# Patient Record
Sex: Female | Born: 1935 | Race: White | Hispanic: No | Smoking: Never smoker
Health system: Southern US, Community
[De-identification: ages and names within clinical notes are randomized; demographics above are authoritative.]

## PROBLEM LIST (undated history)

## (undated) DIAGNOSIS — K449 Diaphragmatic hernia without obstruction or gangrene: Secondary | ICD-10-CM

## (undated) DIAGNOSIS — I10 Essential (primary) hypertension: Secondary | ICD-10-CM

## (undated) DIAGNOSIS — C50919 Malignant neoplasm of unspecified site of unspecified female breast: Secondary | ICD-10-CM

## (undated) DIAGNOSIS — C189 Malignant neoplasm of colon, unspecified: Secondary | ICD-10-CM

## (undated) DIAGNOSIS — B029 Zoster without complications: Secondary | ICD-10-CM

## (undated) DIAGNOSIS — H269 Unspecified cataract: Secondary | ICD-10-CM

## (undated) DIAGNOSIS — D239 Other benign neoplasm of skin, unspecified: Secondary | ICD-10-CM

## (undated) DIAGNOSIS — M1990 Unspecified osteoarthritis, unspecified site: Secondary | ICD-10-CM

## (undated) DIAGNOSIS — K639 Disease of intestine, unspecified: Secondary | ICD-10-CM

## (undated) DIAGNOSIS — T7840XA Allergy, unspecified, initial encounter: Secondary | ICD-10-CM

## (undated) DIAGNOSIS — Z8719 Personal history of other diseases of the digestive system: Secondary | ICD-10-CM

## (undated) DIAGNOSIS — Z853 Personal history of malignant neoplasm of breast: Secondary | ICD-10-CM

## (undated) DIAGNOSIS — H9190 Unspecified hearing loss, unspecified ear: Secondary | ICD-10-CM

## (undated) DIAGNOSIS — C801 Malignant (primary) neoplasm, unspecified: Secondary | ICD-10-CM

## (undated) DIAGNOSIS — S62101A Fracture of unspecified carpal bone, right wrist, initial encounter for closed fracture: Secondary | ICD-10-CM

## (undated) DIAGNOSIS — Z8711 Personal history of peptic ulcer disease: Secondary | ICD-10-CM

## (undated) DIAGNOSIS — N952 Postmenopausal atrophic vaginitis: Secondary | ICD-10-CM

## (undated) DIAGNOSIS — R12 Heartburn: Secondary | ICD-10-CM

## (undated) HISTORY — DX: Malignant neoplasm of unspecified site of unspecified female breast: C50.919

## (undated) HISTORY — DX: Unspecified hearing loss, unspecified ear: H91.90

## (undated) HISTORY — PX: COLON SURGERY: SHX602

## (undated) HISTORY — DX: Malignant neoplasm of colon, unspecified: C18.9

## (undated) HISTORY — DX: Personal history of malignant neoplasm of breast: Z85.3

## (undated) HISTORY — PX: ABDOMINAL HYSTERECTOMY: SHX81

## (undated) HISTORY — DX: Zoster without complications: B02.9

## (undated) HISTORY — DX: Postmenopausal atrophic vaginitis: N95.2

## (undated) HISTORY — DX: Disease of intestine, unspecified: K63.9

## (undated) HISTORY — DX: Allergy, unspecified, initial encounter: T78.40XA

## (undated) HISTORY — DX: Other benign neoplasm of skin, unspecified: D23.9

## (undated) HISTORY — DX: Unspecified cataract: H26.9

## (undated) HISTORY — DX: Personal history of peptic ulcer disease: Z87.11

## (undated) HISTORY — DX: Personal history of other diseases of the digestive system: Z87.19

## (undated) HISTORY — DX: Diaphragmatic hernia without obstruction or gangrene: K44.9

## (undated) HISTORY — DX: Unspecified osteoarthritis, unspecified site: M19.90

## (undated) HISTORY — DX: Fracture of unspecified carpal bone, right wrist, initial encounter for closed fracture: S62.101A

## (undated) HISTORY — DX: Essential (primary) hypertension: I10

## (undated) HISTORY — DX: Malignant (primary) neoplasm, unspecified: C80.1

## (undated) HISTORY — DX: Heartburn: R12

---

## 1983-05-30 HISTORY — PX: COLON RESECTION: SHX5231

## 2005-01-16 ENCOUNTER — Ambulatory Visit: Payer: Self-pay | Admitting: General Practice

## 2005-01-16 ENCOUNTER — Other Ambulatory Visit: Payer: Self-pay

## 2005-01-27 ENCOUNTER — Ambulatory Visit: Payer: Self-pay | Admitting: General Practice

## 2008-12-24 ENCOUNTER — Emergency Department: Payer: Self-pay | Admitting: Emergency Medicine

## 2009-09-06 ENCOUNTER — Ambulatory Visit: Payer: Self-pay

## 2009-09-15 ENCOUNTER — Encounter: Admission: RE | Admit: 2009-09-15 | Discharge: 2009-09-15 | Payer: Self-pay | Admitting: Unknown Physician Specialty

## 2010-01-27 ENCOUNTER — Ambulatory Visit: Payer: Self-pay | Admitting: Oncology

## 2010-02-04 ENCOUNTER — Inpatient Hospital Stay: Payer: Self-pay | Admitting: Internal Medicine

## 2010-02-08 LAB — CEA: CEA: 1.2 ng/mL (ref 0.0–4.7)

## 2010-02-21 ENCOUNTER — Ambulatory Visit: Payer: Self-pay | Admitting: Unknown Physician Specialty

## 2010-02-26 ENCOUNTER — Ambulatory Visit: Payer: Self-pay | Admitting: Oncology

## 2010-05-29 DIAGNOSIS — I10 Essential (primary) hypertension: Secondary | ICD-10-CM

## 2010-05-29 HISTORY — DX: Essential (primary) hypertension: I10

## 2010-05-29 HISTORY — PX: COLONOSCOPY: SHX174

## 2010-05-29 HISTORY — PX: UPPER GI ENDOSCOPY: SHX6162

## 2011-05-30 DIAGNOSIS — Z853 Personal history of malignant neoplasm of breast: Secondary | ICD-10-CM

## 2011-05-30 HISTORY — DX: Personal history of malignant neoplasm of breast: Z85.3

## 2011-05-30 HISTORY — PX: MASTECTOMY: SHX3

## 2011-08-02 ENCOUNTER — Ambulatory Visit: Payer: Self-pay | Admitting: Family Medicine

## 2011-08-07 ENCOUNTER — Ambulatory Visit: Payer: Self-pay | Admitting: General Surgery

## 2011-08-30 ENCOUNTER — Ambulatory Visit: Payer: Self-pay | Admitting: General Surgery

## 2011-09-06 ENCOUNTER — Ambulatory Visit: Payer: Self-pay | Admitting: General Surgery

## 2011-09-06 DIAGNOSIS — C50919 Malignant neoplasm of unspecified site of unspecified female breast: Secondary | ICD-10-CM

## 2011-09-06 HISTORY — PX: BREAST SURGERY: SHX581

## 2011-09-06 HISTORY — DX: Malignant neoplasm of unspecified site of unspecified female breast: C50.919

## 2011-09-15 LAB — PATHOLOGY REPORT

## 2011-09-22 ENCOUNTER — Ambulatory Visit: Payer: Self-pay | Admitting: Oncology

## 2012-01-06 ENCOUNTER — Emergency Department: Payer: Self-pay | Admitting: Emergency Medicine

## 2012-01-06 LAB — URINALYSIS, COMPLETE
Bilirubin,UR: NEGATIVE
Blood: NEGATIVE
Glucose,UR: 150 mg/dL (ref 0–75)
Specific Gravity: 1.008 (ref 1.003–1.030)
Squamous Epithelial: <1

## 2012-01-06 LAB — COMPREHENSIVE METABOLIC PANEL
Anion Gap: 9 (ref 7–16)
Calcium, Total: 8.7 mg/dL (ref 8.5–10.1)
Creatinine: 0.66 mg/dL (ref 0.60–1.30)
EGFR (African American): >60
Glucose: 213 mg/dL — ABNORMAL HIGH (ref 65–99)
Potassium: 3.5 mmol/L (ref 3.5–5.1)
Sodium: 138 mmol/L (ref 136–145)

## 2012-01-06 LAB — CBC
HCT: 41.1 % (ref 35.0–47.0)
RBC: 4.34 10*6/uL (ref 3.80–5.20)
RDW: 14 % (ref 11.5–14.5)
WBC: 11.4 10*3/uL — ABNORMAL HIGH (ref 3.6–11.0)

## 2012-01-06 LAB — LIPASE, BLOOD: Lipase: 109 U/L (ref 73–393)

## 2012-07-27 ENCOUNTER — Encounter: Payer: Self-pay | Admitting: *Deleted

## 2012-07-27 DIAGNOSIS — I10 Essential (primary) hypertension: Secondary | ICD-10-CM | POA: Insufficient documentation

## 2012-09-04 ENCOUNTER — Encounter: Payer: Self-pay | Admitting: General Surgery

## 2012-09-04 ENCOUNTER — Ambulatory Visit (INDEPENDENT_AMBULATORY_CARE_PROVIDER_SITE_OTHER): Payer: Medicare Other | Admitting: General Surgery

## 2012-09-04 VITALS — BP 130/68 | HR 68 | Resp 16 | Ht 66.0 in | Wt 150.0 lb

## 2012-09-04 DIAGNOSIS — C50919 Malignant neoplasm of unspecified site of unspecified female breast: Secondary | ICD-10-CM

## 2012-09-04 DIAGNOSIS — C50912 Malignant neoplasm of unspecified site of left female breast: Secondary | ICD-10-CM

## 2012-09-04 NOTE — Patient Instructions (Addendum)
Call for any new breast concerns or issues.

## 2012-09-04 NOTE — Progress Notes (Signed)
Patient ID: [name], female   DOB: 07/15/1920, 79 y.o.   MRN: [id number]  Chief Complaint  Patient presents with  . Breast Cancer Long Term Follow Up    HPI [name] is a 79 y.o. female. Patient here today for follow up mammogram.  Patient with known history of left modified radical mastectomy on September 06, 2011, 3.8 cm primary tumor with a 4.6 cm axillary metastasis. 3 of 13 nodes were positive. T2,N1a lesion. This was a triple negative lesion.  Recovering from viral GI bug about 2 weeks ago and did not get her mammogram done. Patient complaints of left breast/flank "sorness" occasionally,   Otherwise no new breast issues.  The patient declines further mammograms. HPI  Past Medical History  Diagnosis Date  . Arthritis   . Cancer     colon 20 years ago  . Hypertension 2012  . Personal history of malignant neoplasm of breast 2013    LEFT MASTECTOMY,left modified radical mastectomy on September 06, 2011 483.8 cm primary tumor with a 4.6 cm axillary metastasis. 3 of 13 nodes were positive. T2,N1a lesion. This was a triple negative lesion  . Benign neoplasm of skin, site unspecified   . Hiatal hernia   . Bowel trouble     Past Surgical History  Procedure Laterality Date  . Mastectomy Left 2013    left modified radical mastectomy on September 06, 2011 483.8 cm primary tumor with a 4.6 cm axillary metastasis. 3 of 13 nodes were positive. T2,N1a lesion. This was a triple negative lesion  . Abdominal hysterectomy      42 YEARS AGO  . Colonoscopy  2012    DR. ELLIOTT  . Upper gi endoscopy  2012    BLEEDING  . Colon surgery  20 YEARS AGO  . Carpal tunnel release Right 45 YEARS AGO  . Breast surgery Left 09-06-11    left modified radical mastectomy on September 06, 2011 483.8 cm primary tumor with a 4.6 cm axillary metastasis. 3 of 13 nodes were positive. T2,N1a lesion. This was a triple negative lesion    Family History  Problem Relation Age of Onset  . Lung cancer Brother     colono ca   . Skin cancer Daughter   . Leukemia Father   . Colon cancer Mother   . Skin cancer Son     Social History History  Substance Use Topics  . Smoking status: Never Smoker   . Smokeless tobacco: Never Used  . Alcohol Use: No    Allergies  Allergen Reactions  . Codeine Nausea And Vomiting    Current Outpatient Prescriptions  Medication Sig Dispense Refill  . amLODipine (NORVASC) 5 MG tablet Take 5 mg by mouth daily.      Marland Kitchen aspirin 81 MG tablet Take 81 mg by mouth daily.      Marland Kitchen loperamide (IMODIUM A-D) 2 MG tablet Take 2 mg by mouth as needed for diarrhea or loose stools.      . metoprolol succinate (TOPROL-XL) 25 MG 24 hr tablet Take 25 mg by mouth daily.      . naproxen sodium (ANAPROX) 220 MG tablet Take 220 mg by mouth 2 (two) times daily with a meal.      . omeprazole (PRILOSEC) 20 MG capsule Take 20 mg by mouth 2 (two) times daily.       Marland Kitchen zolpidem (AMBIEN) 10 MG tablet Take 10 mg by mouth at bedtime as needed for sleep.  No current facility-administered medications for this visit.    Review of Systems Review of Systems  Constitutional: Negative.   Respiratory: Negative.   Cardiovascular: Negative.     Blood pressure 130/68, pulse 68, resp. rate 16, height 5\' 6"  (1.676 m), weight 150 lb (68.04 kg).  Physical Exam Physical Exam  Constitutional: She is oriented to person, place, and time. She appears well-developed.  Cardiovascular: Normal rate, regular rhythm and intact distal pulses.   Pulmonary/Chest: Effort normal and breath sounds normal. Right breast exhibits no inverted nipple, no mass, no nipple discharge, no skin change and no tenderness. Left breast exhibits no skin change and no tenderness.  Musculoskeletal: Normal range of motion.  Lymphadenopathy:    She has no cervical adenopathy.    She has no axillary adenopathy.  Neurological: She is alert and oriented to person, place, and time.  Skin: Skin is warm and dry.   Mild pitting edema bilateral  lower extremities.  Data Reviewed No  Assessment    Doing well status post left modified radical mastectomy.    Plan    The patient is amenable to a one year followup.       Earline Mayotte 09/05/2012, 8:03 PM

## 2012-09-05 ENCOUNTER — Encounter: Payer: Self-pay | Admitting: General Surgery

## 2012-09-05 DIAGNOSIS — C50912 Malignant neoplasm of unspecified site of left female breast: Secondary | ICD-10-CM | POA: Insufficient documentation

## 2012-12-10 ENCOUNTER — Ambulatory Visit: Payer: Self-pay | Admitting: Family Medicine

## 2013-09-03 ENCOUNTER — Other Ambulatory Visit: Payer: Self-pay | Admitting: Physical Medicine and Rehabilitation

## 2013-09-03 DIAGNOSIS — M4800 Spinal stenosis, site unspecified: Secondary | ICD-10-CM

## 2013-09-03 DIAGNOSIS — M545 Low back pain, unspecified: Secondary | ICD-10-CM

## 2013-09-03 DIAGNOSIS — M79605 Pain in left leg: Secondary | ICD-10-CM

## 2013-09-09 ENCOUNTER — Ambulatory Visit (INDEPENDENT_AMBULATORY_CARE_PROVIDER_SITE_OTHER): Payer: Medicare Other | Admitting: General Surgery

## 2013-09-09 ENCOUNTER — Encounter: Payer: Self-pay | Admitting: General Surgery

## 2013-09-09 VITALS — BP 148/80 | HR 70 | Resp 14 | Ht 66.0 in | Wt 156.0 lb

## 2013-09-09 DIAGNOSIS — C50912 Malignant neoplasm of unspecified site of left female breast: Secondary | ICD-10-CM

## 2013-09-09 DIAGNOSIS — Z853 Personal history of malignant neoplasm of breast: Secondary | ICD-10-CM

## 2013-09-09 NOTE — Progress Notes (Signed)
Patient ID: [name], female   DOB: [birth year], 80 y.o.   MRN: [id number]  Chief Complaint  Patient presents with  . Follow-up    breast cancer    HPI [name] is a 80 y.o. female.  Here today for her follow up breast cancer. Patient with known history of left modified radical mastectomy on September 06, 2011, 3.8 cm primary tumor with a 4.6 cm axillary metastasis. 3 of 13 nodes were positive. T2,N1a lesion. This was a triple negative lesion. States her back has been hurting "arthritis" and has been going to [facility] for "shots".  Her last right mammogram was 12-24-12. No new breast complaints.   HPI  Past Medical History  Diagnosis Date  . Hypertension 2012  . Personal history of malignant neoplasm of breast 2013    LEFT MASTECTOMY,left modified radical mastectomy on September 06, 2011 483.8 cm primary tumor with a 4.6 cm axillary metastasis. 3 of 13 nodes were positive. T2,N1a lesion. This was a triple negative lesion  . Benign neoplasm of skin, site unspecified   . Hiatal hernia   . Bowel trouble   . Arthritis     back  . Cancer     colon 20 years ago  . Cancer of breast April 2013    left breast    Past Surgical History  Procedure Laterality Date  . Mastectomy Left 2013    left modified radical mastectomy on September 06, 2011 Stage 2; 3.8 cm primary tumor with a 4.6 cm axillary metastasis. 3 of 13 nodes were positive. T2,N1a lesion. This was a triple negative lesion  . Abdominal hysterectomy      42 YEARS AGO  . Colonoscopy  2012    DR. ELLIOTT  . Upper gi endoscopy  2012    BLEEDING  . Colon surgery  20 YEARS AGO  . Carpal tunnel release Right 45 YEARS AGO  . Breast surgery Left 09-06-11    left modified radical mastectomy on September 06, 2011 483.8 cm primary tumor with a 4.6 cm axillary metastasis. 3 of 13 nodes were positive. T2,N1a lesion. This was a triple negative lesion    Family History  Problem Relation Age of Onset  . Lung cancer Brother     colono ca  . Skin  cancer Daughter   . Leukemia Father   . Colon cancer Mother   . Skin cancer Son     Social History History  Substance Use Topics  . Smoking status: Never Smoker   . Smokeless tobacco: Never Used  . Alcohol Use: No    Allergies  Allergen Reactions  . Codeine Nausea And Vomiting    Current Outpatient Prescriptions  Medication Sig Dispense Refill  . amLODipine (NORVASC) 5 MG tablet Take 5 mg by mouth daily.      Marland Kitchen aspirin 81 MG tablet Take 81 mg by mouth daily.      . Cranberry 500 MG CAPS Take by mouth daily.      Marland Kitchen loperamide (IMODIUM A-D) 2 MG tablet Take 2 mg by mouth as needed for diarrhea or loose stools.      . metoprolol succinate (TOPROL-XL) 25 MG 24 hr tablet Take 25 mg by mouth daily.      . naproxen sodium (ANAPROX) 220 MG tablet Take 220 mg by mouth 2 (two) times daily with a meal.      . omeprazole (PRILOSEC) 20 MG capsule Take 20 mg by mouth 2 (two) times daily.       Marland Kitchen  Potassium Gluconate 595 MG CAPS Take by mouth daily.      . Probiotic Product (ALIGN) 4 MG CAPS Take by mouth daily.      . temazepam (RESTORIL) 15 MG capsule Take 15 mg by mouth at bedtime.      . triamcinolone (NASACORT ALLERGY 24HR) 55 MCG/ACT AERO nasal inhaler Place 2 sprays into the nose daily.      . vitamin B-12 (CYANOCOBALAMIN) 1000 MCG tablet Take 1,000 mcg by mouth daily.       No current facility-administered medications for this visit.    Review of Systems Review of Systems  Constitutional: Negative.   Respiratory: Negative.   Cardiovascular: Negative.     Blood pressure 148/80, pulse 70, resp. rate 14, height 5\' 6"  (1.676 m), weight 156 lb (70.761 kg).  Physical Exam Physical Exam  Constitutional: She is oriented to person, place, and time. She appears well-developed and well-nourished.  Neck: Neck supple.  Cardiovascular: Normal rate, regular rhythm and normal heart sounds.   Pulmonary/Chest: Effort normal and breath sounds normal. Right breast exhibits no inverted nipple,  no mass, no nipple discharge, no skin change and no tenderness.  Left chest wall stable.  Lymphadenopathy:    She has no cervical adenopathy.    She has no axillary adenopathy.  Neurological: She is alert and oriented to person, place, and time.  Skin: Skin is warm and dry.    Data Reviewed December 10, 2012 right breast mammogram was reviewed. BI-RAD-2.  Assessment    Doing well status post left mastectomy. No evidence of recurrent disease.     Plan    The patient was amenable to a one year followup. She had declined further mammograms at the time of her last visit here in April 2014. Her PCP was able to get her to have a mammogram this past summer.  The patient's son accompanied her today and was present for the after exam interview.      PCP: Particia Jasper Luzelena Heeg 09/10/2013, 5:44 PM

## 2013-09-09 NOTE — Patient Instructions (Signed)
Continue self breast exams. Call office for any new breast issues or concerns. 

## 2013-09-10 ENCOUNTER — Encounter: Payer: Self-pay | Admitting: General Surgery

## 2013-09-16 ENCOUNTER — Ambulatory Visit
Admission: RE | Admit: 2013-09-16 | Discharge: 2013-09-16 | Disposition: A | Discharge status: Home / Self Care | Payer: Medicare Other | Source: Ambulatory Visit | Attending: Physical Medicine and Rehabilitation | Admitting: Physical Medicine and Rehabilitation

## 2013-09-16 VITALS — BP 162/75 | HR 67

## 2013-09-16 DIAGNOSIS — M5126 Other intervertebral disc displacement, lumbar region: Secondary | ICD-10-CM

## 2013-09-16 DIAGNOSIS — M545 Low back pain, unspecified: Secondary | ICD-10-CM

## 2013-09-16 DIAGNOSIS — M79605 Pain in left leg: Secondary | ICD-10-CM

## 2013-09-16 DIAGNOSIS — M4800 Spinal stenosis, site unspecified: Secondary | ICD-10-CM

## 2013-09-16 MED ORDER — IOHEXOL 180 MG/ML  SOLN
1.0000 mL | Freq: Once | INTRAMUSCULAR | Status: AC | PRN
  Administered 2013-09-16: 1 mL via EPIDURAL

## 2013-09-16 MED ORDER — METHYLPREDNISOLONE ACETATE 40 MG/ML INJ SUSP (RADIOLOG
120.0000 mg | Freq: Once | INTRAMUSCULAR | Status: AC
  Administered 2013-09-16: 120 mg via EPIDURAL

## 2013-09-16 NOTE — Discharge Instructions (Signed)

## 2013-10-07 ENCOUNTER — Encounter: Payer: Self-pay | Admitting: General Surgery

## 2014-03-30 ENCOUNTER — Encounter: Payer: Self-pay | Admitting: General Surgery

## 2014-03-31 ENCOUNTER — Ambulatory Visit: Payer: Self-pay | Admitting: Family Medicine

## 2014-04-28 DIAGNOSIS — B029 Zoster without complications: Secondary | ICD-10-CM

## 2014-04-28 HISTORY — DX: Zoster without complications: B02.9

## 2014-09-09 ENCOUNTER — Encounter: Payer: Self-pay | Admitting: General Surgery

## 2014-09-09 ENCOUNTER — Ambulatory Visit (INDEPENDENT_AMBULATORY_CARE_PROVIDER_SITE_OTHER): Payer: Medicare Other | Admitting: General Surgery

## 2014-09-09 VITALS — BP 160/86 | HR 86 | Resp 18 | Ht 66.0 in | Wt 157.0 lb

## 2014-09-09 DIAGNOSIS — C50912 Malignant neoplasm of unspecified site of left female breast: Secondary | ICD-10-CM

## 2014-09-09 NOTE — Progress Notes (Signed)
Patient ID: [name], female   DOB: June 18, 1920, 81 y.o.   MRN: [id number]  Chief Complaint  Patient presents with  . Follow-up    HPI [name] is a 81 y.o. female here today for her one year folow up breast cancer. She states she is doing well with no problems. She did have shingles Dec 2015. No new breast issues. She does have some carpal tunnel issue with the right hand. She has seen Skip Estimable, M.D. in the past and was encouraged to call his office for assessment. She is accompanied today by her daughter who was present for the interview and exam.  HPI  Past Medical History  Diagnosis Date  . Hypertension 2012  . Personal history of malignant neoplasm of breast 2013    LEFT MASTECTOMY,left modified radical mastectomy on September 06, 2011 483.8 cm primary tumor with a 4.6 cm axillary metastasis. 3 of 13 nodes were positive. T2,N1a lesion. This was a triple negative lesion  . Benign neoplasm of skin, site unspecified   . Hiatal hernia   . Bowel trouble   . Arthritis     back  . Shingles Dec 2015  . Hard of hearing   . Cancer     colon 20 years ago  . Cancer of breast April 10,.2013    Left breast, 3.8 cm, 4 cm axillary node, T2, N1a, ER negative PR negative, HER-2/neu not amplified.    Past Surgical History  Procedure Laterality Date  . Mastectomy Left 2013    left modified radical mastectomy on September 06, 2011 Stage 2; 3.8 cm primary tumor with a 4.6 cm axillary metastasis. 3 of 13 nodes were positive. T2,N1a lesion. This was a triple negative lesion  . Abdominal hysterectomy      42 YEARS AGO  . Colonoscopy  2012    DR. ELLIOTT  . Upper gi endoscopy  2012    BLEEDING  . Colon surgery  20 YEARS AGO  . Carpal tunnel release Right 45 YEARS AGO  . Breast surgery Left 09-06-11    left modified radical mastectomy on September 06, 2011 483.8 cm primary tumor with a 4.6 cm axillary metastasis. 3 of 13 nodes were positive. T2,N1a lesion. This was a triple negative lesion     Family History  Problem Relation Age of Onset  . Lung cancer Brother     colono ca  . Skin cancer Daughter   . Leukemia Father   . Colon cancer Mother   . Skin cancer Son     Social History History  Substance Use Topics  . Smoking status: Never Smoker   . Smokeless tobacco: Never Used  . Alcohol Use: No    Allergies  Allergen Reactions  . Codeine Nausea And Vomiting    Current Outpatient Prescriptions  Medication Sig Dispense Refill  . Cranberry 500 MG CAPS Take by mouth daily.    Marland Kitchen loperamide (IMODIUM A-D) 2 MG tablet Take 2 mg by mouth as needed for diarrhea or loose stools.    . metoprolol succinate (TOPROL-XL) 25 MG 24 hr tablet Take 25 mg by mouth daily.    . naproxen sodium (ANAPROX) 220 MG tablet Take 220 mg by mouth 2 (two) times daily with a meal.    . omeprazole (PRILOSEC) 20 MG capsule Take 20 mg by mouth 2 (two) times daily.     . Potassium Gluconate 595 MG CAPS Take by mouth daily.    . Probiotic Product (ALIGN) 4 MG  CAPS Take by mouth daily.    . temazepam (RESTORIL) 15 MG capsule Take 15 mg by mouth at bedtime.    . triamcinolone (NASACORT ALLERGY 24HR) 55 MCG/ACT AERO nasal inhaler Place 2 sprays into the nose as needed.     . vitamin B-12 (CYANOCOBALAMIN) 1000 MCG tablet Take 1,000 mcg by mouth daily.     No current facility-administered medications for this visit.    Review of Systems Review of Systems  Constitutional: Negative.   Respiratory: Negative.   Cardiovascular: Negative.     Blood pressure 160/86, pulse 86, resp. rate 18, height 5' 6" (1.676 m), weight 157 lb (71.215 kg).  Physical Exam Physical Exam  Constitutional: She is oriented to person, place, and time. She appears well-developed and well-nourished.  Neck: Neck supple.  Cardiovascular: Normal rate, regular rhythm and normal heart sounds.   Pulmonary/Chest: Effort normal and breath sounds normal. Right breast exhibits no inverted nipple, no mass, no nipple discharge, no skin  change and no tenderness.    Left modified mastectomy site well healed.  Lymphadenopathy:    She has no cervical adenopathy.    She has no axillary adenopathy.  Neurological: She is alert and oriented to person, place, and time.  Skin: Skin is warm and dry.    Data Reviewed Patient has declined further mammograms.  Assessment    Doing well now 3 years status post left modified radical mastectomy.    Plan    We'll plan for follow-up examination in one year    Patient to return in one year.   PCP:  Virgie Dad 09/09/2014, 10:05 AM

## 2014-09-09 NOTE — Patient Instructions (Signed)
The patient is aware to call back for any questions or concerns.  

## 2014-09-17 ENCOUNTER — Ambulatory Visit
Admit: 2014-09-17 | Disposition: A | Discharge status: Home / Self Care | Payer: Self-pay | Attending: General Practice | Admitting: General Practice

## 2014-09-20 NOTE — Op Note (Signed)
PATIENT NAME:  [name], [name] MR#:  [id number] DATE OF BIRTH:  [birth year]  DATE OF PROCEDURE:  09/06/2011  PREOPERATIVE DIAGNOSIS: Left breast cancer with axillary metastases.   POSTOPERATIVE DIAGNOSIS: Left breast cancer with axillary metastases.   OPERATIVE PROCEDURE: Left modified radical mastectomy.   OPERATING SURGEON: Robert Bellow, MD   ANESTHESIA: General endotracheal under Dr. Benjamine Mola.   ESTIMATED BLOOD LOSS: 30 mL.   CLINICAL NOTE: This 81 year old woman had a longstanding mass in the breast and recently had a core biopsy showing evidence of infiltrating mammary cancer. Given her options for management, she elected to proceed to modified radical mastectomy.   She received Kefzol prior to the procedure.   OPERATIVE NOTE: After undergoing general endotracheal anesthesia, the breast, chest, and axilla was prepped with ChloraPrep and draped. An elliptical incision was outlined beginning just lateral to the sternum and extending to the anterior axillary line. Flaps were elevated with the following limits: Clavicle superiorly, sternum medially, rectus fascia inferiorly, and the serratus muscle laterally. The rest was elevated off the underlying pectoralis muscle taking the fascia with the specimen. Electrocautery and Harmonic scalpel was used for hemostasis. The axillary envelope was opened and the axillary vein identified. Tissue inferior to this was swept away with the specimen. A slightly enlarged node at the apex of the level two nodes was sent as separate specimen. The thoracodorsal nerve, artery and vein bundle and the long thoracic nerve of Bell were identified and protected. The wound was irrigated with saline. A single Blake drain was brought out through the inferior flap on the medial aspect and anchored in place with 3-0 nylon. The skin was then approximated with interrupted 2-0 Vicryl deep dermal suture in a running fashion. Benzoin and Steri-Strips were applied. Fluff gauze,  Kerlix, and an Ace wrap were then placed.    The patient tolerated the procedure well and was taken to the recovery room in stable condition.   ____________________________ Robert Bellow, MD [id number] D: 09/06/2011 14:31:48 ET T: 09/06/2011 16:31:24 ET JOB#: [id number]  cc: Robert Bellow, MD, <Dictator> Irven Easterly. Kary Kos, MD Kennet Mccort Amedeo Kinsman MD ELECTRONICALLY SIGNED 09/08/2011 10:20

## 2014-09-25 ENCOUNTER — Ambulatory Visit
Admit: 2014-09-25 | Disposition: A | Discharge status: Home / Self Care | Payer: Self-pay | Attending: General Practice | Admitting: General Practice

## 2014-09-25 HISTORY — PX: CARPAL TUNNEL RELEASE: SHX101

## 2014-09-27 NOTE — Op Note (Addendum)
PATIENT NAME:  COPPER, KIRTLEY MR#:  [id number] DATE OF BIRTH:  09/13/1920  DATE OF PROCEDURE:  09/25/2014  PREOPERATIVE DIAGNOSIS: Right carpal tunnel syndrome.   POSTOPERATIVE DIAGNOSIS: Right carpal tunnel syndrome.   PROCEDURE PERFORMED: Right carpal tunnel release.   SURGEON: Laurice Record. Holley Bouche., MD   ANESTHESIA: Bier block.   ESTIMATED BLOOD LOSS: Minimal.   TOURNIQUET TIME: 41 minutes.   DRAINS: None.   INDICATIONS FOR SURGERY: The patient is a 81 year old, right-hand dominant female who presented with complaints of numbness, paresthesias, and pain to the right hand. The previous EMG nerve conduction studies demonstrated findings consistent with bilateral carpal tunnel syndrome. She previously underwent left carpal tunnel release with good results. After discussion of the risks and benefits of surgical intervention, the patient expressed understanding of the risks and benefits, and agreed with plans for surgical intervention.   PROCEDURE IN DETAIL: The patient was brought into the operating room and, after adequate Bier block was achieved, the patient's right hand and arm were cleaned and prepped with alcohol and DuraPrep and draped in the usual sterile fashion. A "timeout" was performed as per usual protocol. Loupe magnification was used throughout the procedure. A curvilinear incision was made, gently curving just ulnar to the thenar palmar crease. Dissection was carried down to the transverse carpal ligament. The transverse carpal ligament was sharply incised, taking care to protect the underlying structures within the carpal tunnel. Complete release of the transverse carpal ligament was achieved. The wound was irrigated with copious amounts of normal saline with antibiotic solution. Skin edges were reapproximated with interrupted sutures of #5-0 nylon. A sterile dressing was applied after injection of 10 mL of 0.25% Marcaine along the incision site. A volar splint was applied. The  tourniquet was deflated after a total tourniquet time of 41 minutes.   The patient tolerated the procedure well. She was transported to the recovery room in stable condition.    ____________________________ Laurice Record. Holley Bouche., MD [id number] D: 09/25/2014 09:11:52 ET T: 09/25/2014 11:11:31 ET JOB#: [id number]  cc: Jeneen Rinks P. Holley Bouche., MD, <Dictator> Rithik Odea P Holley Bouche MD ELECTRONICALLY SIGNED 09/26/2014 7:49

## 2015-02-15 ENCOUNTER — Other Ambulatory Visit: Payer: Self-pay | Admitting: Family Medicine

## 2015-02-15 DIAGNOSIS — G8929 Other chronic pain: Secondary | ICD-10-CM

## 2015-02-15 DIAGNOSIS — M545 Low back pain: Principal | ICD-10-CM

## 2015-02-24 ENCOUNTER — Ambulatory Visit
Admission: RE | Admit: 2015-02-24 | Discharge: 2015-02-24 | Disposition: A | Discharge status: Home / Self Care | Payer: Medicare Other | Source: Ambulatory Visit | Attending: Family Medicine | Admitting: Family Medicine

## 2015-02-24 VITALS — BP 179/90 | HR 72

## 2015-02-24 DIAGNOSIS — D126 Benign neoplasm of colon, unspecified: Secondary | ICD-10-CM | POA: Insufficient documentation

## 2015-02-24 DIAGNOSIS — G8929 Other chronic pain: Secondary | ICD-10-CM

## 2015-02-24 DIAGNOSIS — K5790 Diverticulosis of intestine, part unspecified, without perforation or abscess without bleeding: Secondary | ICD-10-CM | POA: Insufficient documentation

## 2015-02-24 DIAGNOSIS — M545 Low back pain: Secondary | ICD-10-CM

## 2015-02-24 DIAGNOSIS — K209 Esophagitis, unspecified without bleeding: Secondary | ICD-10-CM | POA: Insufficient documentation

## 2015-02-24 DIAGNOSIS — J329 Chronic sinusitis, unspecified: Secondary | ICD-10-CM | POA: Insufficient documentation

## 2015-02-24 DIAGNOSIS — D649 Anemia, unspecified: Secondary | ICD-10-CM | POA: Insufficient documentation

## 2015-02-24 DIAGNOSIS — K449 Diaphragmatic hernia without obstruction or gangrene: Secondary | ICD-10-CM | POA: Insufficient documentation

## 2015-02-24 DIAGNOSIS — K219 Gastro-esophageal reflux disease without esophagitis: Secondary | ICD-10-CM | POA: Insufficient documentation

## 2015-02-24 DIAGNOSIS — M5137 Other intervertebral disc degeneration, lumbosacral region: Secondary | ICD-10-CM

## 2015-02-24 MED ORDER — METHYLPREDNISOLONE ACETATE 40 MG/ML INJ SUSP (RADIOLOG
120.0000 mg | Freq: Once | INTRAMUSCULAR | Status: AC
  Administered 2015-02-24: 120 mg via EPIDURAL

## 2015-02-24 MED ORDER — IOHEXOL 180 MG/ML  SOLN
1.0000 mL | Freq: Once | INTRAMUSCULAR | Status: DC | PRN
  Administered 2015-02-24: 1 mL via EPIDURAL

## 2015-02-24 NOTE — Discharge Instructions (Signed)

## 2015-07-14 ENCOUNTER — Encounter: Payer: Self-pay | Admitting: Family Medicine

## 2015-07-19 ENCOUNTER — Encounter: Payer: Self-pay | Admitting: Urology

## 2015-07-19 ENCOUNTER — Ambulatory Visit (INDEPENDENT_AMBULATORY_CARE_PROVIDER_SITE_OTHER): Payer: Medicare Other | Admitting: Urology

## 2015-07-19 VITALS — BP 186/84 | HR 76 | Ht 66.0 in | Wt 153.4 lb

## 2015-07-19 DIAGNOSIS — N8110 Cystocele, unspecified: Secondary | ICD-10-CM

## 2015-07-19 DIAGNOSIS — IMO0001 Reserved for inherently not codable concepts without codable children: Secondary | ICD-10-CM

## 2015-07-19 DIAGNOSIS — N390 Urinary tract infection, site not specified: Secondary | ICD-10-CM | POA: Diagnosis not present

## 2015-07-19 DIAGNOSIS — N765 Ulceration of vagina: Secondary | ICD-10-CM

## 2015-07-19 DIAGNOSIS — IMO0002 Reserved for concepts with insufficient information to code with codable children: Secondary | ICD-10-CM

## 2015-07-19 DIAGNOSIS — N952 Postmenopausal atrophic vaginitis: Secondary | ICD-10-CM

## 2015-07-19 LAB — URINALYSIS, COMPLETE
Bilirubin, UA: NEGATIVE
Glucose, UA: NEGATIVE
KETONES UA: NEGATIVE
NITRITE UA: NEGATIVE
SPEC GRAV UA: 1.015 (ref 1.005–1.030)
Urobilinogen, Ur: 0.2 mg/dL (ref 0.2–1.0)
pH, UA: 5.5 (ref 5.0–7.5)

## 2015-07-19 LAB — MICROSCOPIC EXAMINATION: RBC MICROSCOPIC, UA: NONE SEEN /HPF (ref 0–?)

## 2015-07-19 LAB — BLADDER SCAN AMB NON-IMAGING: Scan Result: 210

## 2015-07-19 MED ORDER — ESTRADIOL 0.1 MG/GM VA CREA: TOPICAL_CREAM | VAGINAL | Status: DC

## 2015-07-19 NOTE — Progress Notes (Signed)
07/19/2015 3:51 PM   [name] 08/31/1920 [id number]  Referring provider: [name], MD [address] [facility] [place], [place] [address]  Chief Complaint  Patient presents with  . Recurrent UTI    referred by Dr. Mosie Epstein    HPI: Patient is a 82 year old Caucasian female with a history of recurrent urinary tract infections who is referred by her primary care physician, Dr. Kary Kos, for further evaluation and management.  Patient has had 4 documented urinary tract infections over the last year.  They have been positive for Escherichia coli and Klebsiella.  Her symptoms of urinary tract infections consist of lower back ache.  She most recently developed burning.  She has not had gross hematuria or suprapubic pain.  She does not have a prior history of nephrolithiasis.   She does state she drinks a lot of water.  She is not sexually active.  She does have diarrhea due to a previous colon surgery. She wears a pad daily, but she states she has intermittent minimal incontinence.  Today, she states she has experienced burning that started 2 weeks ago.  Her UA noted possible contamination. Her PVR was 210 mL on today's exam.  PMH: Past Medical History  Diagnosis Date  . Hypertension 2012  . Personal history of malignant neoplasm of breast 2013    LEFT MASTECTOMY,left modified radical mastectomy on September 06, 2011 483.8 cm primary tumor with a 4.6 cm axillary metastasis. 3 of 13 nodes were positive. T2,N1a lesion. This was a triple negative lesion  . Benign neoplasm of skin, site unspecified   . Hiatal hernia   . Bowel trouble   . Arthritis     back  . Shingles Dec 2015  . Hard of hearing   . Cancer ([place])     colon 20 years ago  . Cancer of breast [place]) April 10,.2013    Left breast, 3.8 cm, 4 cm axillary node, T2, N1a, ER negative PR negative, HER-2/neu not amplified.  Marland Kitchen Heartburn   . History of stomach ulcers     Surgical History: Past Surgical History    Procedure Laterality Date  . Mastectomy Left 2013    left modified radical mastectomy on September 06, 2011 Stage 2; 3.8 cm primary tumor with a 4.6 cm axillary metastasis. 3 of 13 nodes were positive. T2,N1a lesion. This was a triple negative lesion  . Abdominal hysterectomy      42 YEARS AGO  . Colonoscopy  2012    DR. ELLIOTT  . Upper gi endoscopy  2012    BLEEDING  . Colon surgery  20 YEARS AGO  . Carpal tunnel release Right 45 YEARS AGO  . Breast surgery Left 09-06-11    left modified radical mastectomy on September 06, 2011 483.8 cm primary tumor with a 4.6 cm axillary metastasis. 3 of 13 nodes were positive. T2,N1a lesion. This was a triple negative lesion    Home Medications:    Medication List       This list is accurate as of: 07/19/15  3:51 PM.  Always use your most recent med list.               ALIGN 4 MG Caps  Take by mouth daily.     ciprofloxacin 250 MG tablet  Commonly known as:  CIPRO  Reported on 07/19/2015     Cranberry 500 MG Caps  Take by mouth daily.     estradiol 0.1 MG/GM vaginal cream  Commonly known as:  ESTRACE VAGINAL  Apply 0.'5mg'$  (pea-sized amount)  just inside the vaginal introitus with a finger-tip every night for two weeks and then Monday, Wednesday and Friday nights.     IMODIUM A-D 2 MG tablet  Generic drug:  loperamide  Take 2 mg by mouth as needed for diarrhea or loose stools.     metoprolol succinate 25 MG 24 hr tablet  Commonly known as:  TOPROL-XL  Take 25 mg by mouth daily.     naproxen sodium 220 MG tablet  Commonly known as:  ANAPROX  Take 220 mg by mouth 2 (two) times daily with a meal.     NASACORT ALLERGY 24HR 55 MCG/ACT Aero nasal inhaler  Generic drug:  triamcinolone  Place 2 sprays into the nose as needed.     omeprazole 20 MG capsule  Commonly known as:  PRILOSEC  Take 20 mg by mouth 2 (two) times daily.     Potassium Gluconate 595 MG Caps  Take by mouth daily.     temazepam 15 MG capsule  Commonly known as:   RESTORIL  Take 15 mg by mouth at bedtime.     vitamin B-12 1000 MCG tablet  Commonly known as:  CYANOCOBALAMIN  Take 1,000 mcg by mouth daily.        Allergies:  Allergies  Allergen Reactions  . Codeine Nausea And Vomiting    Family History: Family History  Problem Relation Age of Onset  . Lung cancer Brother     colono ca  . Skin cancer Daughter   . Leukemia Father   . Colon cancer Mother   . Skin cancer Son   . Kidney disease Neg Hx   . Bladder Cancer Neg Hx     Social History:  reports that she has never smoked. She has never used smokeless tobacco. She reports that she does not drink alcohol or use illicit drugs.  ROS: UROLOGY Frequent Urination?: No Hard to postpone urination?: No Burning/pain with urination?: Yes Get up at night to urinate?: Yes Leakage of urine?: No Urine stream starts and stops?: No Trouble starting stream?: No Do you have to strain to urinate?: No Blood in urine?: No Urinary tract infection?: Yes Sexually transmitted disease?: No Injury to kidneys or bladder?: No Painful intercourse?: No Weak stream?: Yes Currently pregnant?: No Vaginal bleeding?: No Last menstrual period?: n  Gastrointestinal Nausea?: No Vomiting?: No Indigestion/heartburn?: Yes Diarrhea?: Yes Constipation?: No  Constitutional Fever: No Night sweats?: No Weight loss?: No Fatigue?: Yes  Skin Skin rash/lesions?: No Itching?: No  Eyes Blurred vision?: No Double vision?: No  Ears/Nose/Throat Sore throat?: No Sinus problems?: No  Hematologic/Lymphatic Swollen glands?: No Easy bruising?: Yes  Cardiovascular Leg swelling?: No Chest pain?: No  Respiratory Cough?: No Shortness of breath?: No  Endocrine Excessive thirst?: No  Musculoskeletal Back pain?: Yes Joint pain?: Yes  Neurological Headaches?: No Dizziness?: No  Psychologic Depression?: No Anxiety?: No  Physical Exam: BP 186/84 mmHg  Pulse 76  Ht '5\' 6"'$  (1.676 m)  Wt 153  lb 6.4 oz (69.582 kg)  BMI 24.77 kg/m2  Constitutional: Well nourished. Alert and oriented, No acute distress. HEENT: [place] AT, moist mucus membranes. Trachea midline, no masses. Cardiovascular: No clubbing, cyanosis, or edema. Respiratory: Normal respiratory effort, no increased work of breathing. GI: Abdomen is soft, non tender, non distended, no abdominal masses. Liver and spleen not palpable.  No hernias appreciated.  Stool sample for occult testing is not indicated.   GU: No  CVA tenderness.  No bladder fullness or masses.  Atrophic external genitalia, sparse pubic hair distribution, no lesions.  Normal urethral meatus, no lesions, no prolapse, no discharge.   Urethral caruncle is noted. No bladder fullness, tenderness or masses. Ulcerative vagina mucosa at the base, poor estrogen effect, no discharge, no lesions, good pelvic support, cystocele is noted.  No rectocele noted.  Uterus and cervix are surgically absent.    No pelvic masses noted.   Anus and perineum are without rashes or lesions.    Skin: No rashes, bruises or suspicious lesions. Lymph: No cervical or inguinal adenopathy. Neurologic: Grossly intact, no focal deficits, moving all 4 extremities. Psychiatric: Normal mood and affect.  Laboratory Data: Lab Results  Component Value Date   WBC 11.4* 01/06/2012   HGB 13.7 01/06/2012   HCT 41.1 01/06/2012   MCV 95 01/06/2012   PLT 290 01/06/2012    Lab Results  Component Value Date   CREATININE 0.66 01/06/2012   Lab Results  Component Value Date   AST 23 01/06/2012   Lab Results  Component Value Date   ALT 21 01/06/2012    Urinalysis Results for orders placed or performed in visit on 07/19/15  Microscopic Examination  Result Value Ref Range   WBC, UA >30 (H) 0 -  5 /hpf   RBC, UA None seen 0 -  2 /hpf   Epithelial Cells (non renal) 0-10 0 - 10 /hpf   Bacteria, UA Many (A) None seen/Few  Urinalysis, Complete  Result Value Ref Range   Specific Gravity, UA 1.015  1.005 - 1.030   pH, UA 5.5 5.0 - 7.5   Color, UA Yellow Yellow   Appearance Ur Cloudy (A) Clear   Leukocytes, UA 1+ (A) Negative   Protein, UA 1+ (A) Negative/Trace   Glucose, UA Negative Negative   Ketones, UA Negative Negative   RBC, UA 2+ (A) Negative   Bilirubin, UA Negative Negative   Urobilinogen, Ur 0.2 0.2 - 1.0 mg/dL   Nitrite, UA Negative Negative   Microscopic Examination See below:    Pertinent Imaging: Results for ALLYSE, FREGEAU (MRN [id number]) as of 07/19/2015 11:36  Ref. Range 07/19/2015 11:16  Scan Result Unknown 210    Assessment & Plan:    1. Recurrent UTI:   Patient has had 4 documented UTIs over the last year.  She was found to have atrophic vaginitis, wears pads continuously even though she has minimal incontinence, has a moderate residual and has chronic diarrhea.  I explained to the patient that these can contribute to her recurrent UTIs.  She'll be referred to gynecology for possible pessary fitting and an evaluation of the vaginal ulcer.  She is also given a prescription of Estrace cream and instructed on its use.  She will return to [place] when she has completed her gynecology referral.  At that time we'll recheck a UA, PVR and exam.  She will also try to not wear the pad so often.    - Urinalysis, Complete - BLADDER SCAN AMB NON-IMAGING  2. Atrophic vaginitis:   Patient was prescribed vaginal estrogen cream and instructed to apply 0.'5mg'$  (pea-sized amount)  just inside the vaginal introitus with a finger-tip every night for two weeks and then Monday, Wednesday and Friday nights.  I explained to the patient that vaginally administered estrogen, which causes only a slight increase in the blood estrogen levels, have fewer contraindications and adverse systemic effects that oral HT.  3. Cystocele:   Patient will  be referred to gynecology for pessary fitting.    4. Vaginal ulcer:   Patient will be referred to gynecology for further evaluation.   Return for refer to  gynecology.  These notes generated with voice recognition software. I apologize for typographical errors.  Zara Council, [facility] [address] [place], [place] [address] [phone]

## 2015-07-19 NOTE — Patient Instructions (Signed)
Patient was prescribed vaginal estrogen cream and instructed to apply 0.5mg  (pea-sized amount)  just inside the vaginal introitus with a finger-tip every night for two weeks and then Monday, Wednesday and Friday nights.  I explained to the patient that vaginally administered estrogen, which causes only a slight increase in the blood estrogen levels, have fewer contraindications and adverse systemic effects that oral HT.

## 2015-08-10 ENCOUNTER — Ambulatory Visit (INDEPENDENT_AMBULATORY_CARE_PROVIDER_SITE_OTHER): Payer: Medicare Other | Admitting: Obstetrics and Gynecology

## 2015-08-10 ENCOUNTER — Encounter: Payer: Self-pay | Admitting: Obstetrics and Gynecology

## 2015-08-10 VITALS — BP 168/80 | HR 76 | Ht 64.0 in | Wt 159.0 lb

## 2015-08-10 DIAGNOSIS — N8111 Cystocele, midline: Secondary | ICD-10-CM | POA: Insufficient documentation

## 2015-08-10 DIAGNOSIS — R399 Unspecified symptoms and signs involving the genitourinary system: Secondary | ICD-10-CM | POA: Diagnosis not present

## 2015-08-10 DIAGNOSIS — R159 Full incontinence of feces: Secondary | ICD-10-CM

## 2015-08-10 DIAGNOSIS — M545 Low back pain, unspecified: Secondary | ICD-10-CM | POA: Insufficient documentation

## 2015-08-10 DIAGNOSIS — N952 Postmenopausal atrophic vaginitis: Secondary | ICD-10-CM

## 2015-08-10 DIAGNOSIS — R339 Retention of urine, unspecified: Secondary | ICD-10-CM

## 2015-08-10 DIAGNOSIS — Z87440 Personal history of urinary (tract) infections: Secondary | ICD-10-CM

## 2015-08-10 HISTORY — DX: Postmenopausal atrophic vaginitis: N95.2

## 2015-08-10 LAB — POCT URINALYSIS DIPSTICK
BILIRUBIN UA: NEGATIVE
GLUCOSE UA: NEGATIVE
KETONES UA: NEGATIVE
Nitrite, UA: NEGATIVE
PH UA: 6
Protein, UA: NEGATIVE
RBC UA: NEGATIVE
Spec Grav, UA: 1.025
Urobilinogen, UA: NEGATIVE

## 2015-08-10 NOTE — Patient Instructions (Signed)

## 2015-08-10 NOTE — Progress Notes (Signed)
Subjective:    [name] is a 82 y.o. [id number] female who presents as a refferral from [facility] Zara Council, [place])  for evaluation of a cystocele and "vaginal ulcers", and recurrent UTIs. Problem started several years ago (having bladder drop), however notes frequent UTIs beginning  ~ 1 year ago ago (approximately 4 in past year). Symptoms include: prolapse of tissue with straining, discomfort: moderate and incomplete bladder emptying, and back pain. Symptoms have gradually worsened.  Also complains of burning with urination today.  Desires to be screened for UTI.   Of note, patient has had a history of breast cancer ~ 4 years ago, s/p double mastectomy (for left breast cancer), and h/o colon cancer ~ 30-40 years ago (s/p hemicolectomy).  Does note some diarrhea and intermittent minimal fecal incontinence.   Menstrual History: OB History    Gravida Para Term Preterm AB TAB SAB Ectopic Multiple Living   '5 4   1  1   4      ' Obstetric Comments    First Pregnancy 27  Fisrt menstrual 2      Menarche age: 68 No LMP recorded. Patient has had a hysterectomy.      Past Medical History  Diagnosis Date  . Hypertension 2012  . Personal history of malignant neoplasm of breast 2013    LEFT MASTECTOMY,left modified radical mastectomy on September 06, 2011 483.8 cm primary tumor with a 4.6 cm axillary metastasis. 3 of 13 nodes were positive. T2,N1a lesion. This was a triple negative lesion  . Benign neoplasm of skin, site unspecified   . Hiatal hernia   . Bowel trouble   . Arthritis     back  . Shingles Dec 2015  . Hard of hearing   . Cancer ([place])     colon 20 years ago  . Cancer of breast [facility]) April 10,.2013    Left breast, 3.8 cm, 4 cm axillary node, T2, N1a, ER negative PR negative, HER-2/neu not amplified.  Marland Kitchen Heartburn   . History of stomach ulcers     Family History  Problem Relation Age of Onset  . Lung cancer Brother     colono ca  . Skin cancer  Daughter   . Leukemia Father   . Colon cancer Mother   . Skin cancer Son   . Kidney disease Neg Hx   . Bladder Cancer Neg Hx     Past Surgical History  Procedure Laterality Date  . Mastectomy Left 2013    left modified radical mastectomy on September 06, 2011 Stage 2; 3.8 cm primary tumor with a 4.6 cm axillary metastasis. 3 of 13 nodes were positive. T2,N1a lesion. This was a triple negative lesion  . Abdominal hysterectomy      42 YEARS AGO  . Colonoscopy  2012    DR. ELLIOTT  . Upper gi endoscopy  2012    BLEEDING  . Colon surgery  20 YEARS AGO  . Carpal tunnel release Right 45 YEARS AGO  . Breast surgery Left 09-06-11    left modified radical mastectomy on September 06, 2011 483.8 cm primary tumor with a 4.6 cm axillary metastasis. 3 of 13 nodes were positive. T2,N1a lesion. This was a triple negative lesion     Social History   Social History  . Marital Status: Widowed    Spouse Name: N/A  . Number of Children: N/A  . Years of Education: N/A   Occupational History  . Not on  file.   Social History Main Topics  . Smoking status: Never Smoker   . Smokeless tobacco: Never Used  . Alcohol Use: No  . Drug Use: No  . Sexual Activity: Not on file   Other Topics Concern  . Not on file   Social History Narrative    Current Outpatient Prescriptions on File Prior to Visit  Medication Sig Dispense Refill  . ciprofloxacin (CIPRO) 250 MG tablet Reported on 07/19/2015  0  . Cranberry 500 MG CAPS Take by mouth daily.    Marland Kitchen estradiol (ESTRACE VAGINAL) 0.1 MG/GM vaginal cream Apply 0.64m (pea-sized amount)  just inside the vaginal introitus with a finger-tip every night for two weeks and then Monday, Wednesday and Friday nights. 30 g 12  . loperamide (IMODIUM A-D) 2 MG tablet Take 2 mg by mouth as needed for diarrhea or loose stools.    . metoprolol succinate (TOPROL-XL) 25 MG 24 hr tablet Take 25 mg by mouth daily.    . naproxen sodium (ANAPROX) 220 MG tablet Take 220 mg by mouth 2  (two) times daily with a meal.    . omeprazole (PRILOSEC) 20 MG capsule Take 20 mg by mouth 2 (two) times daily.     . Potassium Gluconate 595 MG CAPS Take by mouth daily.    . Probiotic Product (ALIGN) 4 MG CAPS Take by mouth daily.    . temazepam (RESTORIL) 15 MG capsule Take 15 mg by mouth at bedtime.    . triamcinolone (NASACORT ALLERGY 24HR) 55 MCG/ACT AERO nasal inhaler Place 2 sprays into the nose as needed.     . vitamin B-12 (CYANOCOBALAMIN) 1000 MCG tablet Take 1,000 mcg by mouth daily.     No current facility-administered medications on file prior to visit.    Allergies  Allergen Reactions  . Codeine Nausea And Vomiting    Review of Systems Pertinent items noted in HPI and remainder of comprehensive ROS otherwise negative.   Objective:     BP 168/80 mmHg  Pulse 76  Ht '5\' 4"'  (1.626 m)  Wt 159 lb (72.122 kg)  BMI 27.28 kg/m2 General Appearance:   alert and no distress  Abdomen:   soft, non-tender; bowel sounds normal; no masses,  no organomegaly.  Well healed abdominal incision.   Pelvis:  External genitalia: normal general appearance Urinary system: urethral meatus normal Vaginal: cystocele present, complete prolapse.  No ulcers noted. Moderate atrophy present.  Cervix: removed surgically Adnexa: non palpable Uterus: removed surgically Rectal: good sphincter tone and no masses  Extremities:   extremities normal, atraumatic, no cyanosis or edema  Skin:  Skin color, texture, turgor normal. No rashes or lesions  Neurologic:  Neurologic: Grossly normal      Labs:  Results for orders placed or performed in visit on 08/10/15  POCT urinalysis dipstick  Result Value Ref Range   Color, UA yellow    Clarity, UA clear    Glucose, UA neg    Bilirubin, UA neg    Ketones, UA neg    Spec Grav, UA 1.025    Blood, UA neg    pH, UA 6.0    Protein, UA neg    Urobilinogen, UA negative    Nitrite, UA neg    Leukocytes, UA moderate (2+) (A) Negative    Assessment:     The patient has a cystocele with vaginal vault prolapse Incomplete bladder emptying Minimal intermittent fecal incontinence H/o recurrent UTIs Back pain Vaginal atrophy (moderate) UTI symptoms  Plan:   -  Discussed cystoceles and management options with the patient. - All questions answered. - Neurosurgeon distributed. - Discussed pessary and will plan visit for fitting. - Symptoms of back pain, incomplete bladder emptying, and recurrent UTIs likely secondary to bladder prolapse.  Will likely improve once prolapse is resolved.  - Fecal incontinence may also improve with pessary.  - Discussed use of Premarin cream at urethral meatus to help with treatment of recurrent UTIs. Patient notes that prescription was $300 and did not want to pay for it if it was not going to work or if she developed a reaction (as she notes having sensitive skin).  Will give samples.  Given coupon in case patient is able to use without reaction, to decrease cost (or can change to Estrace, if cheaper option). - UA performed today, normal.  Not enough specimen to perform culture.   - Follow up in 1-2 weeks for pessary fitting.         Rubie Maid, MD [facility] 08/10/2015 11:03 PM

## 2015-09-01 ENCOUNTER — Encounter: Payer: Self-pay | Admitting: Obstetrics and Gynecology

## 2015-09-01 ENCOUNTER — Ambulatory Visit (INDEPENDENT_AMBULATORY_CARE_PROVIDER_SITE_OTHER): Payer: Medicare Other | Admitting: Obstetrics and Gynecology

## 2015-09-01 VITALS — Ht 64.0 in | Wt 156.5 lb

## 2015-09-01 DIAGNOSIS — Z87440 Personal history of urinary (tract) infections: Secondary | ICD-10-CM

## 2015-09-01 DIAGNOSIS — IMO0001 Reserved for inherently not codable concepts without codable children: Secondary | ICD-10-CM

## 2015-09-01 DIAGNOSIS — M545 Low back pain, unspecified: Secondary | ICD-10-CM

## 2015-09-01 DIAGNOSIS — Z4689 Encounter for fitting and adjustment of other specified devices: Secondary | ICD-10-CM

## 2015-09-01 DIAGNOSIS — R339 Retention of urine, unspecified: Secondary | ICD-10-CM

## 2015-09-01 DIAGNOSIS — IMO0002 Reserved for concepts with insufficient information to code with codable children: Secondary | ICD-10-CM

## 2015-09-01 DIAGNOSIS — N8110 Cystocele, unspecified: Secondary | ICD-10-CM | POA: Diagnosis not present

## 2015-09-01 NOTE — Progress Notes (Signed)
    GYNECOLOGY PROGRESS NOTE  Subjective:    Patient ID: [name], female    DOB: 1920-07-20, 82 y.o.   MRN: [id number]  HPI  Patient is an 82 y.o. [name] female who (82 y.o.) presents for pessary fitting.  Patient with prior complaints of cystocele, recurrent UTI's, incomplete bladder emptying, and back pain.    The following portions of the patient's history were reviewed and updated as appropriate: allergies, current medications, past family history, past medical history, past social history, past surgical history and problem list.  Review of Systems Pertinent items noted in HPI and remainder of comprehensive ROS otherwise negative.   Objective:   Height 5\' 4"  (1.626 m), weight 156 lb 8 oz (70.988 kg). General appearance: alert and no distress Abdomen: soft, non-tender; bowel sounds normal; no masses,  no organomegaly Pelvic:   External genitalia: normal general appearance  Urinary system: urethral meatus normal  Vaginal: cystocele present, complete prolapse. No ulcers noted. Moderate atrophy present.   Cervix: removed surgically  Adnexa: non palpable  Uterus: removed surgically  Rectal: good sphincter tone and no masses  Extremities: extremities normal, atraumatic, no cyanosis or edema Neurologic: Grossly normal   Assessment:    Cystocele (Grade 3),  Rrecurrent UTI's)  Incomplete bladder emptying,   Back pain.  Plan:   Pessary fitting performed today, patient fitted with Size 5 ring with support.  Patient was able to mobilize, and empty bladder fully during trial.   Will order. To f/u in 2 weeks for pessary insertion.  Back pain, recurrent UTI's and incomplete emptying likely due to prolapse.  Will also have patient treat externally (urethral meatus and vaginal vestibule with local estrogen cream. Samples given last visit.    Rubie Maid, MD [facility]

## 2015-09-09 ENCOUNTER — Ambulatory Visit (INDEPENDENT_AMBULATORY_CARE_PROVIDER_SITE_OTHER): Payer: Medicare Other | Admitting: General Surgery

## 2015-09-09 ENCOUNTER — Encounter: Payer: Self-pay | Admitting: Obstetrics and Gynecology

## 2015-09-09 ENCOUNTER — Encounter: Payer: Self-pay | Admitting: General Surgery

## 2015-09-09 ENCOUNTER — Ambulatory Visit (INDEPENDENT_AMBULATORY_CARE_PROVIDER_SITE_OTHER): Payer: Medicare Other | Admitting: Obstetrics and Gynecology

## 2015-09-09 VITALS — BP 142/86 | HR 92 | Ht 66.0 in | Wt 157.3 lb

## 2015-09-09 VITALS — BP 152/80 | HR 88 | Resp 12 | Ht 64.0 in | Wt 157.0 lb

## 2015-09-09 DIAGNOSIS — R339 Retention of urine, unspecified: Secondary | ICD-10-CM | POA: Diagnosis not present

## 2015-09-09 DIAGNOSIS — R159 Full incontinence of feces: Secondary | ICD-10-CM

## 2015-09-09 DIAGNOSIS — M545 Low back pain, unspecified: Secondary | ICD-10-CM

## 2015-09-09 DIAGNOSIS — Z4689 Encounter for fitting and adjustment of other specified devices: Secondary | ICD-10-CM

## 2015-09-09 DIAGNOSIS — N8111 Cystocele, midline: Secondary | ICD-10-CM | POA: Diagnosis not present

## 2015-09-09 DIAGNOSIS — C50912 Malignant neoplasm of unspecified site of left female breast: Secondary | ICD-10-CM | POA: Diagnosis not present

## 2015-09-09 DIAGNOSIS — G609 Hereditary and idiopathic neuropathy, unspecified: Secondary | ICD-10-CM | POA: Insufficient documentation

## 2015-09-09 DIAGNOSIS — N390 Urinary tract infection, site not specified: Secondary | ICD-10-CM

## 2015-09-09 MED ORDER — OXYQUINOLONE SULFATE 0.025 % VA GEL: 1.0000 | VAGINAL | Status: DC

## 2015-09-09 MED ORDER — GABAPENTIN 100 MG PO CAPS: ORAL_CAPSULE | ORAL | Status: DC

## 2015-09-09 NOTE — Progress Notes (Signed)
Patient ID: [name], female   DOB: 31-May-1920, 82 y.o.   MRN: [id number]  Chief Complaint  Patient presents with  . Follow-up    breast cancer    HPI [name] is a 82 y.o. female here today for her follow up breast cancer. Patient states she is doing well.  She is accompanied today by her daughter who was present for the interview and exam.   The patient reports since last year's visit she had her right carpal tunnel repair. She had delayed this for some time and has been left with numbness and angling in the first second and third digits. She is making use of a nighttime brace and glove, but in spite of this continues to have significant discomfort.  She's reporting ongoing back pain, and discomfort when she wears her bra. Most of this is long-standing by her daughter's report. Her graft she recently was started on Estrace cream for recurrent UTIs with good relief, as well as being fitted with a pessary to improve bladder emptying.   I personally reviewed the patient's history. HPI  Past Medical History  Diagnosis Date  . Hypertension 2012  . Personal history of malignant neoplasm of breast 2013    LEFT MASTECTOMY,left modified radical mastectomy on September 06, 2011 483.8 cm primary tumor with a 4.6 cm axillary metastasis. 3 of 13 nodes were positive. T2,N1a lesion. This was a triple negative lesion  . Benign neoplasm of skin, site unspecified   . Hiatal hernia   . Bowel trouble   . Arthritis     back  . Shingles Dec 2015  . Hard of hearing   . Cancer ([place])     colon 20 years ago  . Cancer of breast [facility]) April 10,.2013    Left breast, 3.8 cm, 4 cm axillary node, T2, N1a, ER negative PR negative, HER-2/neu not amplified.  Marland Kitchen Heartburn   . History of stomach ulcers   . Weeks atrophy 08/10/2015    Past Surgical History  Procedure Laterality Date  . Mastectomy Left 2013    left modified radical mastectomy on September 06, 2011 Stage 2; 3.8 cm primary tumor with a 4.6 cm  axillary metastasis. 3 of 13 nodes were positive. T2,N1a lesion. This was a triple negative lesion  . Abdominal hysterectomy      42 YEARS AGO  . Colonoscopy  2012    DR. ELLIOTT  . Upper gi endoscopy  2012    BLEEDING  . Colon surgery  20 YEARS AGO  . Carpal tunnel release Right September 25, 2014    [name], M.D.  . Breast surgery Left 09-06-11    left modified radical mastectomy on September 06, 2011 483.8 cm primary tumor with a 4.6 cm axillary metastasis. 3 of 13 nodes were positive. T2,N1a lesion. This was a triple negative lesion    Family History  Problem Relation Age of Onset  . Lung cancer Brother     colono ca  . Skin cancer Daughter   . Leukemia Father   . Colon cancer Mother   . Skin cancer Son   . Kidney disease Neg Hx   . Bladder Cancer Neg Hx     Social History Social History  Substance Use Topics  . Smoking status: Never Smoker   . Smokeless tobacco: Never Used  . Alcohol Use: No    Allergies  Allergen Reactions  . Codeine Nausea And Vomiting    Current Outpatient Prescriptions  Medication Sig  Dispense Refill  . Cranberry 500 MG CAPS Take by mouth daily.    Marland Kitchen estradiol (ESTRACE Weeks) 0.1 MG/GM Weeks cream Apply 0.'5mg'$  (pea-sized amount)  just inside the Weeks introitus with a finger-tip every night for two weeks and then Monday, Wednesday and Friday nights. 30 g 12  . loperamide (IMODIUM A-D) 2 MG tablet Take 2 mg by mouth as needed for diarrhea or loose stools.    . metoprolol succinate (TOPROL-XL) 25 MG 24 hr tablet Take 25 mg by mouth daily.    . naproxen sodium (ANAPROX) 220 MG tablet Take 220 mg by mouth 2 (two) times daily with a meal.    . omeprazole (PRILOSEC) 20 MG capsule Take 20 mg by mouth 2 (two) times daily.     [name] 0.025 % GEL Place 1 Applicatorful vaginally 2 (two) times a week. 1 Tube 3  . Potassium Gluconate 595 MG CAPS Take by mouth daily.    . Probiotic Product (ALIGN) 4 MG CAPS Take by mouth daily.     . temazepam (RESTORIL) 15 MG capsule Take 15 mg by mouth at bedtime.    . triamcinolone (NASACORT ALLERGY 24HR) 55 MCG/ACT AERO nasal inhaler Place 2 sprays into the nose as needed.     . vitamin B-12 (CYANOCOBALAMIN) 1000 MCG tablet Take 1,000 mcg by mouth daily.    Marland Kitchen gabapentin (NEURONTIN) 100 MG capsule Take on tablet each night for two weeks. Increase to two tablets nightly if no improvement. 90 capsule 6   No current facility-administered medications for this visit.    Review of Systems Review of Systems  Constitutional: Negative.   Respiratory: Negative.   Cardiovascular: Negative.     Blood pressure 152/80, pulse 88, resp. rate 12, height '5\' 4"'$  (1.626 m), weight 157 lb (71.215 kg).  Physical Exam Physical Exam  Constitutional: She is oriented to person, place, and time. She appears well-developed and well-nourished.  Eyes: Conjunctivae are normal. No scleral icterus.  Neck: Neck supple.  Cardiovascular: Normal rate, regular rhythm and normal heart sounds.   Pulmonary/Chest: Effort normal and breath sounds normal. Right breast exhibits no inverted nipple, no mass, no nipple discharge, no skin change and no tenderness.  Left mastectomy site is clean and well healed.  Lymphadenopathy:    She has no cervical adenopathy.    She has no axillary adenopathy.  Neurological: She is alert and oriented to person, place, and time.  Skin: Skin is warm and dry.    Data Reviewed Operative note from April 2016 carpal tunnel release.  Phone conversation today with Dr. Marry Guan.  Last year the patient declared she would be no longer getting mammograms.  Assessment    No evidence of recurrent breast cancer.  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    Plan    In discussion with Dr. Marry Guan was elected to make use of a trial of Neurontin.   Patient to take Neurontin 100 mg @ bedtime for 2 weeks and if needed increase 2 separate bedtime for 2 weeks with a maximum  increase to 3 tablets at bedtime if needed.  Patient to return in one year. PCP:  [name] This information has been scribed by [name] CMA.    [name] 09/09/2015, 11:17 AM

## 2015-09-09 NOTE — Progress Notes (Signed)
       GYNECOLOGY PROGRESS NOTE  Subjective:    Patient ID: [name], female    DOB: 1920/09/03, 82 y.o.   MRN: [id number]  HPI  Patient is a 82 y.o. [id number] female who presents for pessary insertion.  Has complaints today of diarrhea (2 episodes).  Notes that she has a history of diarrhea (occurs every few days).    The following portions of the patient's history were reviewed and updated as appropriate: allergies, current medications, past family history, past medical history, past social history, past surgical history and problem list.  Review of Systems Pertinent items noted in HPI and remainder of comprehensive ROS otherwise negative.   Objective:   Blood pressure 142/86, pulse 92, height 5\' 6"  (1.676 m), weight 157 lb 4.8 oz (71.351 kg). General appearance: alert Abdomen: soft, non-tender; bowel sounds normal; no masses,  no organomegaly Pelvic: External genitalia: normal general appearance.  Vaginal: cystocele present, complete prolapse. No ulcers noted. Moderate atrophy present. Cervix: removed surgically. Adnexa: non palpable. Uterus: removed surgically. Rectal: good sphincter tone and no masses Extremities: extremities normal, atraumatic, no cyanosis or edema Neurologic: Grossly normal   Assessment:  Cystocele with vaginal vault prolapse Incomplete bladder emptying Minimal intermittent fecal incontinence H/o recurrent UTIs Back pain Vaginal atrophy (moderate)  Plan:  The patient presented today for a pessary insertion.  Inserted Size 5 ring with support today.  The patient should return in 2 weeks for a pessary check.  Prescribed Trimo-San gel to use internally twice weekly, and continue to use Premarin cream samples for external use near urethral meatus for h/o recurrent UTIs.    Rubie Maid, MD [facility]

## 2015-09-09 NOTE — Patient Instructions (Addendum)
Patient to take Neurontin 100 mg @ bedtime for 2 weeks and if needed increase two as needed. Return on one year

## 2015-09-14 ENCOUNTER — Telehealth: Payer: Self-pay | Admitting: *Deleted

## 2015-09-14 NOTE — Telephone Encounter (Signed)
Excellent

## 2015-09-14 NOTE — Telephone Encounter (Signed)
Patient called to let you know that she has been taking the Neurontin and it has been helping. She appreciates your help and wanted to let you know.

## 2015-09-21 ENCOUNTER — Encounter: Payer: Medicare Other | Admitting: Obstetrics and Gynecology

## 2015-09-21 ENCOUNTER — Ambulatory Visit (INDEPENDENT_AMBULATORY_CARE_PROVIDER_SITE_OTHER): Payer: Medicare Other | Admitting: Obstetrics and Gynecology

## 2015-09-21 VITALS — BP 153/62 | HR 74 | Ht 64.0 in | Wt 156.0 lb

## 2015-09-21 DIAGNOSIS — N390 Urinary tract infection, site not specified: Secondary | ICD-10-CM

## 2015-09-21 DIAGNOSIS — N952 Postmenopausal atrophic vaginitis: Secondary | ICD-10-CM | POA: Diagnosis not present

## 2015-09-21 DIAGNOSIS — R339 Retention of urine, unspecified: Secondary | ICD-10-CM

## 2015-09-21 DIAGNOSIS — N8111 Cystocele, midline: Secondary | ICD-10-CM

## 2015-09-24 ENCOUNTER — Encounter: Payer: Self-pay | Admitting: Obstetrics and Gynecology

## 2015-09-24 NOTE — Progress Notes (Signed)
    GYNECOLOGY PROGRESS NOTE  Subjective:    Patient ID: [name], female    DOB: January 27, 1921, 82 y.o.   MRN: [id number]  HPI  Patient is a 82 y.o. [name] female who presents for today for 2 week pessary check.  Patient notes that she is very excited about the pessary.  No longer has difficulty emptying her bladder.  Sometimes has occasional leakage due to forgetting to go to the restroom (ignores sensations sometimes). Notes back pain has improved. She reports no vaginal bleeding or discharge. She denies pelvic discomfort and difficulty urinating or moving her bowels.   The following portions of the patient's history were reviewed and updated as appropriate: allergies, current medications, past family history, past medical history, past social history, past surgical history and problem list.  Review of Systems Pertinent items noted in HPI and remainder of comprehensive ROS otherwise negative.   Objective:   Blood pressure 153/62, pulse 74, height 5\' 4"  (1.626 m), weight 156 lb (70.761 kg). General appearance: alert Pelvic: The patient's size 5 pessary was removed, cleaned and replaced without complications.  Speculum examination revealed normal vaginal mucosa with no lesions or lacerations.   Assessment:  Cystocele with vaginal vault prolapse Incomplete bladder emptying H/o recurrent UTIs Back pain Vaginal atrophy (moderate)  Plan:   The patient presented today for a pessary check.  Inserted Size 5 ring with support today.  The patient should return in 10 weeks for a pessary check.  Continue to use Trimo-San gel internally twice weekly, and continue to use Premarin cream samples for external use near urethral meatus for h/o recurrent UTIs.    Rubie Maid, MD [facility]

## 2015-12-21 ENCOUNTER — Ambulatory Visit (INDEPENDENT_AMBULATORY_CARE_PROVIDER_SITE_OTHER): Payer: Medicare Other | Admitting: Obstetrics and Gynecology

## 2015-12-21 VITALS — BP 150/93 | Ht 64.0 in | Wt 156.3 lb

## 2015-12-21 DIAGNOSIS — N952 Postmenopausal atrophic vaginitis: Secondary | ICD-10-CM | POA: Diagnosis not present

## 2015-12-21 DIAGNOSIS — Z9289 Personal history of other medical treatment: Secondary | ICD-10-CM

## 2015-12-21 DIAGNOSIS — R399 Unspecified symptoms and signs involving the genitourinary system: Secondary | ICD-10-CM | POA: Diagnosis not present

## 2015-12-21 DIAGNOSIS — N390 Urinary tract infection, site not specified: Secondary | ICD-10-CM

## 2015-12-21 DIAGNOSIS — N8111 Cystocele, midline: Secondary | ICD-10-CM | POA: Diagnosis not present

## 2015-12-21 DIAGNOSIS — Z960 Presence of urogenital implants: Secondary | ICD-10-CM

## 2015-12-21 LAB — POCT URINALYSIS DIPSTICK
Bilirubin, UA: NEGATIVE
Blood, UA: NEGATIVE
GLUCOSE UA: NEGATIVE
KETONES UA: NEGATIVE
Nitrite, UA: POSITIVE
Protein, UA: NEGATIVE
SPEC GRAV UA: 1.02
UROBILINOGEN UA: NEGATIVE
pH, UA: 6

## 2015-12-21 MED ORDER — SULFAMETHOXAZOLE-TRIMETHOPRIM 800-160 MG PO TABS: 1.0000 | ORAL_TABLET | Freq: Two times a day (BID) | ORAL | 1 refills | Status: DC

## 2015-12-21 NOTE — Progress Notes (Signed)
    GYNECOLOGY PROGRESS NOTE  Subjective:    Patient ID: LENNIS DYNES, female    DOB: Sep 25, 1920, 82 y.o.   MRN: [id number]  HPI  Patient is a 82 y.o. [id number] female who presents for today for 3 month pessary check.  Patient continues to note improvement in symptoms with pessary (able to completely empty bladder). She reports no vaginal bleeding or discharge. She denies pelvic discomfort and difficulty urinating or moving her bowels.  Had UTI several months ago, treated. Currently noting UTI symptoms (low back pain, mild dysuria, strong odor to urine)   The following portions of the patient's history were reviewed and updated as appropriate: allergies, current medications, past family history, past medical history, past social history, past surgical history and problem list.  Review of Systems Pertinent items noted in HPI and remainder of comprehensive ROS otherwise negative.   Objective:   Blood pressure (!) 150/93, height 5\' 4"  (1.626 m), weight 156 lb 4.8 oz (70.9 kg). General appearance: alert Pelvic: The patient's size 5 pessary was removed, cleaned and replaced without complications.  Speculum examination revealed normal vaginal mucosa with no lesions or lacerations.   Labs:  Results for orders placed or performed in visit on 12/21/15  Urine culture  Result Value Ref Range   Urine Culture, Routine Final report (A)    Urine Culture result 1 Escherichia coli (A)    ANTIMICROBIAL SUSCEPTIBILITY Comment   POCT urinalysis dipstick  Result Value Ref Range   Color, UA yellow    Clarity, UA clear    Glucose, UA neg    Bilirubin, UA neg    Ketones, UA neg    Spec Grav, UA 1.020    Blood, UA neg    pH, UA 6.0    Protein, UA neg    Urobilinogen, UA negative    Nitrite, UA POSITIVE    Leukocytes, UA small (1+) (A) Negative     Assessment:  Cystocele with vaginal vault prolapse H/o recurrent UTIs, currently with UTI Back pain Vaginal atrophy (moderate)  Plan:   - The  patient presented today for a pessary check.  Inserted Size 5 ring with support today.  The patient should return in 10-12 weeks for a pessary check.  Continue to use Trimo-San gel internally twice weekly, and continue to use Premarin cream samples for external use near urethral meatus for h/o recurrent UTIs.   - Will treat current UTI with Bactrim DS (usually placed on Cipro 3 day course but desires something different as she thinks she may be getting used to the Cipro).  Will also send urine culture.    Rubie Maid, MD [facility]

## 2015-12-23 LAB — URINE CULTURE

## 2016-01-10 ENCOUNTER — Encounter: Payer: Self-pay | Admitting: Podiatry

## 2016-01-10 ENCOUNTER — Ambulatory Visit (INDEPENDENT_AMBULATORY_CARE_PROVIDER_SITE_OTHER): Payer: Medicare Other | Admitting: Podiatry

## 2016-01-10 DIAGNOSIS — B351 Tinea unguium: Secondary | ICD-10-CM

## 2016-01-10 DIAGNOSIS — M79676 Pain in unspecified toe(s): Secondary | ICD-10-CM | POA: Diagnosis not present

## 2016-01-10 DIAGNOSIS — R234 Changes in skin texture: Secondary | ICD-10-CM

## 2016-01-10 DIAGNOSIS — M79673 Pain in unspecified foot: Secondary | ICD-10-CM

## 2016-01-10 NOTE — Progress Notes (Signed)
   Subjective:    Patient ID: [name], female    DOB: 17-Feb-1921, 82 y.o.   MRN: [id number]  HPI: She presents today with her daughter with a chief complaint of grossly elongated toenails which are painful.    Review of Systems  Skin: Positive for color change.       Objective:   Physical Exam: Vital signs are stable she is alert and oriented 3. Pulses are palpable. Neurologic sensorium is intact. Deep tendon reflexes are intact. Muscle strength is normal bilateral. Toenails are thick yellow dystrophic onychomycotic and painful on palpation. She has small superficial reactive hyperkeratosis to the distal aspect of the right hallux. No open lesions or wounds are noted.        Assessment & Plan:  Pain in limb secondary to onychomycosis.  Plan: Debridement of toenails 1 through 5 bilateral. Follow up with her in 3 months

## 2016-03-08 ENCOUNTER — Telehealth: Payer: Self-pay | Admitting: Obstetrics and Gynecology

## 2016-03-08 NOTE — Telephone Encounter (Signed)
Pt called and she stated that she is having a lot of lower back pain and last time she was here she had a UTI so she didn't know if she could come and do a urine drop off or if she needs to make an appt to be seen, pt would like a call back.

## 2016-03-08 NOTE — Telephone Encounter (Signed)
Can we please call this pt and add her to Dr.Cherry's schedule for UTI thanks

## 2016-03-09 ENCOUNTER — Ambulatory Visit (INDEPENDENT_AMBULATORY_CARE_PROVIDER_SITE_OTHER): Payer: Medicare Other | Admitting: Obstetrics and Gynecology

## 2016-03-09 ENCOUNTER — Encounter: Payer: Self-pay | Admitting: Obstetrics and Gynecology

## 2016-03-09 VITALS — BP 153/90 | HR 90 | Ht 64.0 in | Wt 156.4 lb

## 2016-03-09 DIAGNOSIS — N8111 Cystocele, midline: Secondary | ICD-10-CM

## 2016-03-09 DIAGNOSIS — N390 Urinary tract infection, site not specified: Secondary | ICD-10-CM

## 2016-03-09 DIAGNOSIS — R399 Unspecified symptoms and signs involving the genitourinary system: Secondary | ICD-10-CM

## 2016-03-09 DIAGNOSIS — R194 Change in bowel habit: Secondary | ICD-10-CM | POA: Diagnosis not present

## 2016-03-09 DIAGNOSIS — N952 Postmenopausal atrophic vaginitis: Secondary | ICD-10-CM

## 2016-03-09 DIAGNOSIS — R339 Retention of urine, unspecified: Secondary | ICD-10-CM

## 2016-03-09 DIAGNOSIS — Z4689 Encounter for fitting and adjustment of other specified devices: Secondary | ICD-10-CM

## 2016-03-09 LAB — POCT URINALYSIS DIPSTICK
BILIRUBIN UA: NEGATIVE
Glucose, UA: NEGATIVE
KETONES UA: NEGATIVE
Nitrite, UA: POSITIVE
PH UA: 6
Protein, UA: NEGATIVE
RBC UA: NEGATIVE
SPEC GRAV UA: 1.01
Urobilinogen, UA: NEGATIVE

## 2016-03-09 MED ORDER — SULFAMETHOXAZOLE-TRIMETHOPRIM 800-160 MG PO TABS: 1.0000 | ORAL_TABLET | Freq: Two times a day (BID) | ORAL | 1 refills | Status: DC

## 2016-03-09 NOTE — Progress Notes (Signed)
    GYNECOLOGY PROGRESS NOTE  Subjective:    Patient ID: [name], female    DOB: 10-Feb-1921, 82 y.o.   MRN: [id number]  HPI  Patient is a 82 y.o. [id number] female who presents for complaints of UTI symptoms.  Symptoms have been ongoing for approximately 2-3 days, and include burning and irritation upon urination. Patient notes that she is using Azo tablets for discomfort. Of note, patient has a history of recurrent UTIs, last UTI was noted in July. Patient also desires to have her pessary changed today, as it will be due in 2 weeks to be cleaned and would like to eliminate multiple visits.  The following portions of the patient's history were reviewed and updated as appropriate: allergies, current medications, past family history, past medical history, past social history, past surgical history and problem list.  Review of Systems A comprehensive review of systems was negative except for: Gastrointestinal: positive for Loose stools x 2-3 days.  Notes that this is common, alternating with constipation   Objective:   Blood pressure (!) 153/90, pulse 90, height 5\' 4"  (1.626 m), weight 156 lb 6.4 oz (70.9 kg). General appearance: alert and no distress Abdomen: soft, non-tender; bowel sounds normal; no masses,  no organomegaly.  Mild suprapubic tenderness.  No CVA tenderness.  Pelvic:  The patient's size 5 pessary was removed, cleaned and replaced without complications.  Speculum examination revealed normal vaginal mucosa with no lesions or lacerations   Labs:  Results for orders placed or performed in visit on 03/09/16  POCT urinalysis dipstick  Result Value Ref Range   Color, UA yellow    Clarity, UA clear    Glucose, UA neg    Bilirubin, UA neg    Ketones, UA neg    Spec Grav, UA 1.010    Blood, UA neg    pH, UA 6.0    Protein, UA neg    Urobilinogen, UA negative    Nitrite, UA POSITIVE    Leukocytes, UA large (3+) (A) Negative    Assessment:   UTI H/o recurrent UTIs H/o  incomplete bladder emptying Pessary maintenance Cystocele with vaginal vault prolapse Vaginal atrophy (moderate) Change in bowel habits  Plan:   - Will treat current UTI with Bactrim DS.  Will also send urine culture.  Patient also desires to have TOC to ensure UTI is gone.  Will return in 2 weeks to drop off sample for repeat UA with culture.  - The patient presented today for a pessary check.  Inserted Size 5 ring with support today.  The patient should return in 10-12 weeks for a pessary check.  Continue to use Trimo-San gel internally twice weekly, and continue to use Premarin cream samples for external use near urethral meatus for h/o recurrent UTIs.   - Can use Immodium or Pepto-Bismol for GI symptoms  Sandra Maid, MD [facility]

## 2016-03-09 NOTE — Telephone Encounter (Signed)
CALLED PT AND SHE CAN COME IN FOR HER UTI AT 4:00 SINCE THAT THE ONLY TIME SHE CAN GET SOMEONE TO BRING HER, I TOLD HER TO CALL ME IF SHE CAN COME IN SOONER.

## 2016-03-12 LAB — URINE CULTURE

## 2016-03-22 ENCOUNTER — Other Ambulatory Visit
Admission: RE | Admit: 2016-03-22 | Discharge: 2016-03-22 | Disposition: A | Discharge status: Home / Self Care | Payer: Medicare Other | Source: Other Acute Inpatient Hospital | Attending: Unknown Physician Specialty | Admitting: Unknown Physician Specialty

## 2016-03-22 ENCOUNTER — Ambulatory Visit: Payer: Medicare Other | Admitting: Obstetrics and Gynecology

## 2016-03-22 DIAGNOSIS — R197 Diarrhea, unspecified: Secondary | ICD-10-CM | POA: Insufficient documentation

## 2016-03-22 LAB — GASTROINTESTINAL PANEL BY PCR, STOOL (REPLACES STOOL CULTURE)

## 2016-03-22 LAB — C DIFFICILE QUICK SCREEN W PCR REFLEX
C DIFFICILE (CDIFF) INTERP: NOT DETECTED
C Diff antigen: NEGATIVE
C Diff toxin: NEGATIVE

## 2016-03-24 ENCOUNTER — Other Ambulatory Visit
Admission: RE | Admit: 2016-03-24 | Discharge: 2016-03-24 | Disposition: A | Discharge status: Home / Self Care | Payer: Medicare Other | Source: Other Acute Inpatient Hospital | Attending: Cardiology | Admitting: Cardiology

## 2016-03-24 DIAGNOSIS — K529 Noninfective gastroenteritis and colitis, unspecified: Secondary | ICD-10-CM | POA: Insufficient documentation

## 2016-03-27 ENCOUNTER — Telehealth: Payer: Self-pay | Admitting: Obstetrics and Gynecology

## 2016-03-27 NOTE — Telephone Encounter (Signed)
[name] has got a cold and is going to her primary dr and going to let him take care of that.

## 2016-04-03 LAB — PANCREATIC ELASTASE, FECAL

## 2016-04-07 ENCOUNTER — Other Ambulatory Visit: Payer: Self-pay | Admitting: Family Medicine

## 2016-04-07 DIAGNOSIS — M545 Low back pain: Principal | ICD-10-CM

## 2016-04-07 DIAGNOSIS — G8929 Other chronic pain: Secondary | ICD-10-CM

## 2016-04-12 ENCOUNTER — Ambulatory Visit: Payer: Medicare Other | Admitting: Podiatry

## 2016-04-17 ENCOUNTER — Ambulatory Visit
Admission: RE | Admit: 2016-04-17 | Discharge: 2016-04-17 | Disposition: A | Discharge status: Home / Self Care | Payer: Medicare Other | Source: Ambulatory Visit | Attending: Family Medicine | Admitting: Family Medicine

## 2016-04-17 ENCOUNTER — Ambulatory Visit: Payer: Medicare Other | Admitting: Podiatry

## 2016-04-17 DIAGNOSIS — G8929 Other chronic pain: Secondary | ICD-10-CM

## 2016-04-17 DIAGNOSIS — M545 Low back pain: Principal | ICD-10-CM

## 2016-04-17 MED ORDER — IOPAMIDOL (ISOVUE-M 200) INJECTION 41%
1.0000 mL | Freq: Once | INTRAMUSCULAR | Status: AC
  Administered 2016-04-17: 1 mL via EPIDURAL

## 2016-04-17 MED ORDER — METHYLPREDNISOLONE ACETATE 40 MG/ML INJ SUSP (RADIOLOG
120.0000 mg | Freq: Once | INTRAMUSCULAR | Status: AC
  Administered 2016-04-17: 120 mg via EPIDURAL

## 2016-04-17 NOTE — Discharge Instructions (Signed)

## 2016-05-01 ENCOUNTER — Ambulatory Visit (INDEPENDENT_AMBULATORY_CARE_PROVIDER_SITE_OTHER): Payer: Medicare Other | Admitting: Podiatry

## 2016-05-01 ENCOUNTER — Encounter: Payer: Self-pay | Admitting: Podiatry

## 2016-05-01 VITALS — Ht 64.0 in | Wt 156.0 lb

## 2016-05-01 DIAGNOSIS — B351 Tinea unguium: Secondary | ICD-10-CM

## 2016-05-01 DIAGNOSIS — M79676 Pain in unspecified toe(s): Secondary | ICD-10-CM | POA: Diagnosis not present

## 2016-05-01 NOTE — Progress Notes (Signed)
Complaint:  Visit Type: Patient returns to my office for continued preventative foot care services. Complaint: Patient states" my nails have grown long and thick and become painful to walk and wear shoes"  The patient presents for preventative foot care services. No changes to ROS  Podiatric Exam: Vascular: dorsalis pedis and posterior tibial pulses are palpable bilateral. Capillary return is immediate. Temperature gradient is WNL. Skin turgor WNL  Sensorium: Normal Semmes Weinstein monofilament test. Normal tactile sensation bilaterally. Nail Exam: Pt has thick disfigured discolored nails with subungual debris noted bilateral entire nail hallux through fifth toenails Ulcer Exam: There is no evidence of ulcer or pre-ulcerative changes or infection. Orthopedic Exam: Muscle tone and strength are WNL. No limitations in general ROM. No crepitus or effusions noted. Foot type and digits show no abnormalities. Bony prominences are unremarkable. Skin: No Porokeratosis. No infection or ulcers.  Asymptomatic callus right hallux.  Diagnosis:  Onychomycosis, , Pain in right toe, pain in left toes  Treatment & Plan Procedures and Treatment: Consent by patient was obtained for treatment procedures. The patient understood the discussion of treatment and procedures well. All questions were answered thoroughly reviewed. Debridement of mycotic and hypertrophic toenails, 1 through 5 bilateral and clearing of subungual debris. No ulceration, no infection noted. Recommended O'Keefe cream. Return Visit-Office Procedure: Patient instructed to return to the office for a follow up visit 3 months for continued evaluation and treatment.    Jimmy Plessinger DPM 

## 2016-06-08 ENCOUNTER — Encounter: Payer: Medicare Other | Admitting: Obstetrics and Gynecology

## 2016-06-08 ENCOUNTER — Ambulatory Visit (INDEPENDENT_AMBULATORY_CARE_PROVIDER_SITE_OTHER): Payer: Medicare Other | Admitting: Obstetrics and Gynecology

## 2016-06-08 ENCOUNTER — Encounter: Payer: Self-pay | Admitting: Obstetrics and Gynecology

## 2016-06-08 VITALS — BP 186/89 | HR 91 | Ht 64.0 in | Wt 150.4 lb

## 2016-06-08 DIAGNOSIS — R339 Retention of urine, unspecified: Secondary | ICD-10-CM | POA: Diagnosis not present

## 2016-06-08 DIAGNOSIS — N8111 Cystocele, midline: Secondary | ICD-10-CM

## 2016-06-08 DIAGNOSIS — R399 Unspecified symptoms and signs involving the genitourinary system: Secondary | ICD-10-CM

## 2016-06-08 DIAGNOSIS — Z4689 Encounter for fitting and adjustment of other specified devices: Secondary | ICD-10-CM | POA: Diagnosis not present

## 2016-06-08 DIAGNOSIS — N952 Postmenopausal atrophic vaginitis: Secondary | ICD-10-CM | POA: Diagnosis not present

## 2016-06-08 DIAGNOSIS — I10 Essential (primary) hypertension: Secondary | ICD-10-CM | POA: Diagnosis not present

## 2016-06-08 DIAGNOSIS — Z87440 Personal history of urinary (tract) infections: Secondary | ICD-10-CM

## 2016-06-08 LAB — POCT URINALYSIS DIPSTICK
Bilirubin, UA: NEGATIVE
GLUCOSE UA: NEGATIVE
Ketones, UA: NEGATIVE
Nitrite, UA: POSITIVE
PROTEIN UA: NEGATIVE
SPEC GRAV UA: 1.025
UROBILINOGEN UA: NEGATIVE
pH, UA: 5

## 2016-06-08 NOTE — Progress Notes (Signed)
    GYNECOLOGY PROGRESS NOTE  Subjective:    Patient ID: [name], female    DOB: September 02, 1920, 83 y.o.   MRN: [id number]  HPI  Patient is a 83 y.o. [id number] female who presents for pessary check.   Denies vaginal bleeding or abnormal discharge.  Denies difficulty voiding or passing stools.    The following portions of the patient's history were reviewed and updated as appropriate: allergies, current medications, past family history, past medical history, past social history, past surgical history and problem list.  Review of Systems A comprehensive review of systems was negative except for: Gastrointestinal: positive for loose stools ((however notes that this is chronic problem). Genitourinary: positive for dysuria (reports recently being treated for a UTI, just finished antibiotic regimen 2 days ago).   Objective:   Blood pressure (!) 186/89, pulse 91, height 5\' 4"  (1.626 m), weight 150 lb 6.4 oz (68.2 kg). General appearance: alert and no distress Abdomen: soft, non-tender; bowel sounds normal; no masses,  no organomegaly.  Mild suprapubic tenderness.  No CVA tenderness.  Pelvic:  The patient's size 5 pessary was removed, cleaned and replaced without complications.  Speculum examination revealed normal vaginal mucosa with no lesions or lacerations   Labs:  Results for orders placed or performed in visit on 06/08/16  POCT urinalysis dipstick  Result Value Ref Range   Color, UA dark yellow    Clarity, UA clear    Glucose, UA neg    Bilirubin, UA neg    Ketones, UA neg    Spec Grav, UA 1.025    Blood, UA NHT    pH, UA 5.0    Protein, UA neg    Urobilinogen, UA negative    Nitrite, UA positive    Leukocytes, UA small (1+) (A) Negative    Assessment:   UTI symptoms H/o recurrent UTIs H/o incomplete bladder emptying Pessary maintenance Cystocele with vaginal vault prolapse Vaginal atrophy (moderate)  Change in bowel habits Elevated BP (with h/o HTN)  Plan:   -  Patient with recently diagnosed UTI, has just finished treatment course 2 days ago.  Is still having urinary symptoms. UA today positive for nitrites, but may be secondary to recent UTI.  Advised on increasing fluid intake, Azo, or cranberry juice for urinary symptoms.    - Pessary cleaned today.  The patient should return in 10-12 weeks for a pessary check.  Continue to use Trimo-San gel internally twice weekly, and continue to use Premarin cream samples for external use near urethral meatus for h/o recurrent UTIs.   - Patient with loose stools but not diarrhea), notes she has h/o alternating bowel habits.   - Patient with elevated BP today, has a h/o HTN, usually fairly controlled. Reports that she took her meds as prescribed. Advised that if elevated at a subsequent appointment, may need to contact PCP for medication adjustment.    Rubie Maid, MD [facility]

## 2016-08-21 ENCOUNTER — Emergency Department: Payer: Medicare Other

## 2016-08-21 ENCOUNTER — Ambulatory Visit: Payer: Medicare Other | Admitting: Podiatry

## 2016-08-21 ENCOUNTER — Inpatient Hospital Stay
Admission: EM | Admit: 2016-08-21 | Discharge: 2016-08-25 | DRG: 378 | Disposition: A | Discharge status: Home Health | Payer: Medicare Other | Attending: Internal Medicine | Admitting: Internal Medicine

## 2016-08-21 ENCOUNTER — Encounter: Payer: Self-pay | Admitting: Emergency Medicine

## 2016-08-21 DIAGNOSIS — H9190 Unspecified hearing loss, unspecified ear: Secondary | ICD-10-CM | POA: Diagnosis present

## 2016-08-21 DIAGNOSIS — B029 Zoster without complications: Secondary | ICD-10-CM | POA: Diagnosis present

## 2016-08-21 DIAGNOSIS — D509 Iron deficiency anemia, unspecified: Secondary | ICD-10-CM | POA: Diagnosis not present

## 2016-08-21 DIAGNOSIS — D123 Benign neoplasm of transverse colon: Secondary | ICD-10-CM | POA: Diagnosis present

## 2016-08-21 DIAGNOSIS — R0602 Shortness of breath: Secondary | ICD-10-CM

## 2016-08-21 DIAGNOSIS — Z800 Family history of malignant neoplasm of digestive organs: Secondary | ICD-10-CM | POA: Diagnosis not present

## 2016-08-21 DIAGNOSIS — K640 First degree hemorrhoids: Secondary | ICD-10-CM | POA: Diagnosis present

## 2016-08-21 DIAGNOSIS — K921 Melena: Principal | ICD-10-CM | POA: Diagnosis present

## 2016-08-21 DIAGNOSIS — Z90710 Acquired absence of both cervix and uterus: Secondary | ICD-10-CM | POA: Diagnosis not present

## 2016-08-21 DIAGNOSIS — I472 Ventricular tachycardia: Secondary | ICD-10-CM | POA: Diagnosis present

## 2016-08-21 DIAGNOSIS — K449 Diaphragmatic hernia without obstruction or gangrene: Secondary | ICD-10-CM | POA: Diagnosis present

## 2016-08-21 DIAGNOSIS — Z806 Family history of leukemia: Secondary | ICD-10-CM

## 2016-08-21 DIAGNOSIS — E876 Hypokalemia: Secondary | ICD-10-CM | POA: Diagnosis present

## 2016-08-21 DIAGNOSIS — K210 Gastro-esophageal reflux disease with esophagitis: Secondary | ICD-10-CM | POA: Diagnosis present

## 2016-08-21 DIAGNOSIS — I10 Essential (primary) hypertension: Secondary | ICD-10-CM | POA: Diagnosis present

## 2016-08-21 DIAGNOSIS — Z9012 Acquired absence of left breast and nipple: Secondary | ICD-10-CM

## 2016-08-21 DIAGNOSIS — Z9049 Acquired absence of other specified parts of digestive tract: Secondary | ICD-10-CM | POA: Diagnosis not present

## 2016-08-21 DIAGNOSIS — F0390 Unspecified dementia without behavioral disturbance: Secondary | ICD-10-CM | POA: Diagnosis present

## 2016-08-21 DIAGNOSIS — D649 Anemia, unspecified: Secondary | ICD-10-CM

## 2016-08-21 DIAGNOSIS — Z85038 Personal history of other malignant neoplasm of large intestine: Secondary | ICD-10-CM | POA: Diagnosis not present

## 2016-08-21 DIAGNOSIS — K5730 Diverticulosis of large intestine without perforation or abscess without bleeding: Secondary | ICD-10-CM | POA: Diagnosis present

## 2016-08-21 DIAGNOSIS — Z808 Family history of malignant neoplasm of other organs or systems: Secondary | ICD-10-CM | POA: Diagnosis not present

## 2016-08-21 DIAGNOSIS — Z791 Long term (current) use of non-steroidal anti-inflammatories (NSAID): Secondary | ICD-10-CM

## 2016-08-21 DIAGNOSIS — Z801 Family history of malignant neoplasm of trachea, bronchus and lung: Secondary | ICD-10-CM | POA: Diagnosis not present

## 2016-08-21 DIAGNOSIS — N390 Urinary tract infection, site not specified: Secondary | ICD-10-CM | POA: Diagnosis present

## 2016-08-21 DIAGNOSIS — K922 Gastrointestinal hemorrhage, unspecified: Secondary | ICD-10-CM | POA: Diagnosis present

## 2016-08-21 DIAGNOSIS — Z853 Personal history of malignant neoplasm of breast: Secondary | ICD-10-CM | POA: Diagnosis not present

## 2016-08-21 DIAGNOSIS — D62 Acute posthemorrhagic anemia: Secondary | ICD-10-CM | POA: Diagnosis present

## 2016-08-21 LAB — CBC
HEMATOCRIT: 22 % — AB (ref 35.0–47.0)
Hemoglobin: 6.8 g/dL — ABNORMAL LOW (ref 12.0–16.0)
MCH: 22.4 pg — AB (ref 26.0–34.0)
MCHC: 31.1 g/dL — AB (ref 32.0–36.0)
MCV: 72.1 fL — AB (ref 80.0–100.0)
Platelets: 385 10*3/uL (ref 150–440)
RBC: 3.05 MIL/uL — ABNORMAL LOW (ref 3.80–5.20)
RDW: 17.8 % — AB (ref 11.5–14.5)
WBC: 6.6 10*3/uL (ref 3.6–11.0)

## 2016-08-21 LAB — BASIC METABOLIC PANEL
Anion gap: 7 (ref 5–15)
BUN: 16 mg/dL (ref 6–20)
CO2: 23 mmol/L (ref 22–32)
Calcium: 8.5 mg/dL — ABNORMAL LOW (ref 8.9–10.3)
Chloride: 109 mmol/L (ref 101–111)
Creatinine, Ser: 0.71 mg/dL (ref 0.44–1.00)
GFR calc Af Amer: >60 mL/min (ref >60)
GLUCOSE: 113 mg/dL — AB (ref 65–99)
Potassium: 4 mmol/L (ref 3.5–5.1)
Sodium: 139 mmol/L (ref 135–145)

## 2016-08-21 LAB — ABO/RH: ABO/RH(D): A POS

## 2016-08-21 LAB — PREPARE RBC (CROSSMATCH)

## 2016-08-21 LAB — TROPONIN I: Troponin I: <0.03 ng/mL (ref <0.03)

## 2016-08-21 MED ORDER — POTASSIUM GLUCONATE 595 MG PO CAPS: 1.0000 | ORAL_CAPSULE | Freq: Every day | ORAL | Status: DC

## 2016-08-21 MED ORDER — ESTRADIOL 0.1 MG/GM VA CREA
1.0000 | TOPICAL_CREAM | Freq: Every day | VAGINAL | Status: DC | PRN
  Filled 2016-08-21: qty 42.5

## 2016-08-21 MED ORDER — ALIGN 4 MG PO CAPS: 1.0000 | ORAL_CAPSULE | Freq: Every day | ORAL | Status: DC

## 2016-08-21 MED ORDER — TEMAZEPAM 15 MG PO CAPS
15.0000 mg | ORAL_CAPSULE | Freq: Every day | ORAL | Status: DC
  Administered 2016-08-22 – 2016-08-24 (×3): 15 mg via ORAL
  Filled 2016-08-21 (×4): qty 1

## 2016-08-21 MED ORDER — METOPROLOL SUCCINATE ER 25 MG PO TB24
25.0000 mg | ORAL_TABLET | Freq: Every day | ORAL | Status: DC
Start: 2016-08-22 — End: 2016-08-25
  Administered 2016-08-22 – 2016-08-25 (×4): 25 mg via ORAL
  Filled 2016-08-21 (×4): qty 1

## 2016-08-21 MED ORDER — OXYQUINOLONE SULFATE 0.025 % VA GEL: 1.0000 | VAGINAL | Status: DC

## 2016-08-21 MED ORDER — RISAQUAD PO CAPS
1.0000 | ORAL_CAPSULE | Freq: Every day | ORAL | Status: DC
  Administered 2016-08-22 – 2016-08-25 (×5): 1 via ORAL
  Filled 2016-08-21 (×5): qty 1

## 2016-08-21 MED ORDER — SODIUM CHLORIDE 0.9 % IV SOLN
10.0000 mL/h | Freq: Once | INTRAVENOUS | Status: AC
  Administered 2016-08-23: 12:00:00 via INTRAVENOUS

## 2016-08-21 MED ORDER — COLESTIPOL HCL 1 G PO TABS
1.0000 g | ORAL_TABLET | Freq: Two times a day (BID) | ORAL | Status: DC
  Administered 2016-08-22 – 2016-08-25 (×8): 1 g via ORAL
  Filled 2016-08-21 (×8): qty 1

## 2016-08-21 MED ORDER — VALACYCLOVIR HCL 500 MG PO TABS
1000.0000 mg | ORAL_TABLET | Freq: Two times a day (BID) | ORAL | Status: DC
  Administered 2016-08-22 – 2016-08-25 (×8): 1000 mg via ORAL
  Filled 2016-08-21 (×9): qty 2

## 2016-08-21 MED ORDER — VALACYCLOVIR HCL 500 MG PO TABS
1000.0000 mg | ORAL_TABLET | Freq: Three times a day (TID) | ORAL | Status: DC
  Filled 2016-08-21: qty 2

## 2016-08-21 MED ORDER — CRANBERRY 500 MG PO CAPS: ORAL_CAPSULE | Freq: Every day | ORAL | Status: DC

## 2016-08-21 MED ORDER — VITAMIN B-12 1000 MCG PO TABS
1000.0000 ug | ORAL_TABLET | Freq: Every day | ORAL | Status: DC
  Administered 2016-08-22 – 2016-08-25 (×4): 1000 ug via ORAL
  Filled 2016-08-21 (×4): qty 1

## 2016-08-21 MED ORDER — PANTOPRAZOLE SODIUM 40 MG IV SOLR
40.0000 mg | Freq: Two times a day (BID) | INTRAVENOUS | Status: DC
  Administered 2016-08-22 – 2016-08-24 (×6): 40 mg via INTRAVENOUS
  Filled 2016-08-21 (×7): qty 40

## 2016-08-21 MED ORDER — SODIUM CHLORIDE 0.9 % IV SOLN
INTRAVENOUS | Status: DC
  Administered 2016-08-25: 08:00:00 via INTRAVENOUS

## 2016-08-21 NOTE — Progress Notes (Signed)
PHARMACIST - PHYSICIAN ORDER COMMUNICATION  CONCERNING: P&T Medication Policy on Herbal Medications  DESCRIPTION:  This patient's order for:  Cranberry  has been noted.  This product(s) is classified as an "herbal" or natural product. Due to a lack of definitive safety studies or FDA approval, nonstandard manufacturing practices, plus the potential risk of unknown drug-drug interactions while on inpatient medications, the Pharmacy and Therapeutics Committee does not permit the use of "herbal" or natural products of this type within [place].   ACTION TAKEN: The pharmacy department is unable to verify this order at this time and your patient has been informed of this safety policy. Please reevaluate patient's clinical condition at discharge and address if the herbal or natural product(s) should be resumed at that time.   

## 2016-08-21 NOTE — Progress Notes (Signed)
Pharmacy renal dose adjustment per [facility]  Valacyclovir 1g 3 times daily ordered. Dose adjustment per [facility] 30 - 49 ml/min: 1g q12h  Will adjust to valacyclovir 1g q12h per [facility].  Tobie Lords, PharmD, BCPS Clinical Pharmacist 08/22/2016

## 2016-08-21 NOTE — H&P (Signed)
[facility] [place] NAME: [name]    MR#:  [id number]  DATE OF BIRTH:  1921-01-22  DATE OF ADMISSION:  08/21/2016  PRIMARY CARE PHYSICIAN: [name], MD   REQUESTING/REFERRING PHYSICIAN:   CHIEF COMPLAINT: Chest pain, shortness of breath    Chief Complaint  Patient presents with  . Chest Pain    HISTORY OF PRESENT ILLNESS:  [name]  is a 83 y.o. female with a known history of Breast cancer, colon cancer went to primary doctor for evaluation of chest pain, shortness of breath. Patient also has shingles under her right breast. Sent from primary doctor office for chest pain evaluation, he had labs show hemoglobin of 6.8. Patient is a poor historian but she told me that she is having black stools but unable to tell me how long she has black stools. Denies abdominal pain.   PAST MEDICAL HISTORY:   Past Medical History:  Diagnosis Date  . Arthritis    back  . Benign neoplasm of skin, site unspecified   . Bowel trouble   . Cancer ([place])    colon 20 years ago  . Cancer of breast [facility]) April 10,.2013   Left breast, 3.8 cm, 4 cm axillary node, T2, N1a, ER negative PR negative, HER-2/neu not amplified.  . Hard of hearing   . Heartburn   . Hiatal hernia   . History of stomach ulcers   . Hypertension 2012  . Personal history of malignant neoplasm of breast 2013   LEFT MASTECTOMY,left modified radical mastectomy on September 06, 2011 483.8 cm primary tumor with a 4.6 cm axillary metastasis. 3 of 13 nodes were positive. T2,N1a lesion. This was a triple negative lesion  . Shingles Dec 2015  . Vaginal atrophy 08/10/2015    PAST SURGICAL HISTOIRY:   Past Surgical History:  Procedure Laterality Date  . ABDOMINAL HYSTERECTOMY     42 YEARS AGO  . BREAST SURGERY Left 09-06-11   left modified radical mastectomy on September 06, 2011 483.8 cm primary tumor with a 4.6 cm axillary metastasis. 3 of 13 nodes were positive. T2,N1a lesion.  This was a triple negative lesion  . CARPAL TUNNEL RELEASE Right September 25, 2014   Skip Estimable, M.D.  . COLON SURGERY  20 YEARS AGO  . COLONOSCOPY  2012   DR. ELLIOTT  . MASTECTOMY Left 2013   left modified radical mastectomy on September 06, 2011 Stage 2; 3.8 cm primary tumor with a 4.6 cm axillary metastasis. 3 of 13 nodes were positive. T2,N1a lesion. This was a triple negative lesion  . UPPER GI ENDOSCOPY  2012   BLEEDING    SOCIAL HISTORY:   Social History  Substance Use Topics  . Smoking status: Never Smoker  . Smokeless tobacco: Never Used  . Alcohol use No    FAMILY HISTORY:   Family History  Problem Relation Age of Onset  . Lung cancer Brother     colono ca  . Skin cancer Daughter   . Leukemia Father   . Colon cancer Mother   . Skin cancer Son   . Kidney disease Neg Hx   . Bladder Cancer Neg Hx     DRUG ALLERGIES:   Allergies  Allergen Reactions  . Codeine Nausea And Vomiting    REVIEW OF SYSTEMS:  CONSTITUTIONAL: No fever, fatigue or weakness.  EYES: No blurred or double vision.  EARS, NOSE, AND THROAT: No tinnitus or ear pain.  RESPIRATORY:  No cough, shortness of breath, wheezing or hemoptysis.  CARDIOVASCULAR: No chest pain, orthopnea, edema.  GASTROINTESTINAL: No nausea, vomiting, diarrhea or abdominal pain.Has black stools for unknown duration.  GENITOURINARY: No dysuria, hematuria.  ENDOCRINE: No polyuria, nocturia,  HEMATOLOGY: No anemia, easy bruising or bleeding SKIN: No rash or lesion. MUSCULOSKELETAL: No joint pain or arthritis.   NEUROLOGIC: No tingling, numbness, weakness.  PSYCHIATRY: No anxiety or depression.   MEDICATIONS AT HOME:   Prior to Admission medications   Medication Sig Start Date End Date Taking? Authorizing Provider  colestipol (COLESTID) 1 g tablet Take 1 tablet by mouth 2 (two) times daily. 05/15/16  Yes Historical Provider, MD  Cranberry 500 MG CAPS Take by mouth daily.   Yes Historical Provider, MD  gabapentin  (NEURONTIN) 100 MG capsule Take on tablet each night for two weeks. Increase to two tablets nightly if no improvement. 09/09/15  Yes Robert Bellow, MD  metoprolol succinate (TOPROL-XL) 25 MG 24 hr tablet Take 25 mg by mouth daily.   Yes Historical Provider, MD  naproxen sodium (ANAPROX) 220 MG tablet Take 220 mg by mouth 2 (two) times daily with a meal.   Yes Historical Provider, MD  Potassium Gluconate 595 MG CAPS Take 1 capsule by mouth daily.    Yes Historical Provider, MD  Probiotic Product (ALIGN) 4 MG CAPS Take 1 capsule by mouth daily.    Yes Historical Provider, MD  ranitidine (ZANTAC) 150 MG tablet Take by mouth. 02/14/16 02/13/17 Yes Historical Provider, MD  temazepam (RESTORIL) 15 MG capsule Take 15 mg by mouth at bedtime.   Yes Historical Provider, MD  vitamin B-12 (CYANOCOBALAMIN) 1000 MCG tablet Take 1,000 mcg by mouth daily.   Yes Historical Provider, MD  estradiol (ESTRACE VAGINAL) 0.1 MG/GM vaginal cream Apply 0.'5mg'$  (pea-sized amount)  just inside the vaginal introitus with a finger-tip every night for two weeks and then Monday, Wednesday and Friday nights. 07/19/15   Nori Riis, PA-C  loperamide (IMODIUM A-D) 2 MG tablet Take 2 mg by mouth as needed for diarrhea or loose stools.    Historical Provider, MD  OXYQUINOLONE SULFATE VAGINAL 0.025 % GEL Place 1 Applicatorful vaginally 2 (two) times a week. 09/09/15   Rubie Maid, MD  triamcinolone (NASACORT ALLERGY 24HR) 55 MCG/ACT AERO nasal inhaler Place 2 sprays into the nose as needed.     Historical Provider, MD      VITAL SIGNS:  Blood pressure (!) 155/67, pulse 82, temperature 98 F (36.7 C), temperature source Oral, resp. rate (!) 30, height '5\' 6"'$  (1.676 m), weight 68 kg (150 lb), SpO2 100 %.  PHYSICAL EXAMINATION:  GENERAL:  83 y.o.-year-old patient lying in the bed with no acute distress.  EYES: Pupils equal, round, reactive to light and accommodation. No scleral icterus. Extraocular muscles intact.  HEENT: Head  atraumatic, normocephalic. Oropharynx and nasopharynx clear.  NECK:  Supple, no jugular venous distention. No thyroid enlargement, no tenderness.  LUNGS: Normal breath sounds bilaterally, no wheezing, rales,rhonchi or crepitation. No use of accessory muscles of respiration.  CARDIOVASCULAR: S1, S2 normal. No murmurs, rubs, or gallops.  ABDOMEN: Soft, nontender, nondistended. Bowel sounds present. No organomegaly or mass.  EXTREMITIES: No pedal edema, cyanosis, or clubbing.  NEUROLOGIC: Cranial nerves II through XII are intact. Muscle strength 5/5 in all extremities. Sensation intact. Gait not checked.  PSYCHIATRIC: The patient is alert and oriented x 3.  SKIN: No obvious rash, lesion, or ulcer.   LABORATORY PANEL:   CBC  Recent Labs  Lab 08/21/16 1530  WBC 6.6  HGB 6.8*  HCT 22.0*  PLT 385   ------------------------------------------------------------------------------------------------------------------  Chemistries   Recent Labs Lab 08/21/16 1530  NA 139  K 4.0  CL 109  CO2 23  GLUCOSE 113*  BUN 16  CREATININE 0.71  CALCIUM 8.5*   ------------------------------------------------------------------------------------------------------------------  Cardiac Enzymes  Recent Labs Lab 08/21/16 1530  TROPONINI <0.03   ------------------------------------------------------------------------------------------------------------------  RADIOLOGY:  Dg Chest 2 View  Result Date: 08/21/2016 CLINICAL DATA:  Chest pain and shortness of breath for several days EXAM: CHEST  2 VIEW COMPARISON:  08/07/2011 FINDINGS: Cardiac shadow is mildly enlarged. A large hiatal hernia is noted slightly increased from the prior study. The lungs are well aerated bilaterally. No focal infiltrate or sizable effusion is seen. Mild chronic interstitial changes are noted. No acute bony abnormality is seen. IMPRESSION: Large hiatal hernia. No acute abnormality noted. Electronically Signed   By: Inez Catalina M.D.   On: 08/21/2016 16:29    EKG:   Orders placed or performed during the hospital encounter of 08/21/16  . ED EKG within 10 minutes  . ED EKG within 10 minutes  normal sinus rhythm 70 bpm, no ST changes. IMPRESSION AND PLAN:   . 83 year old female patient with chest pain, shortness of breath secondary to GI bleed with black stool, symptomatic anemia. Assessment and plan #1; GI bleed likely slow GI bleed; continue IV PPIs, transfuse 2 units of packed RBC, consult GI gastroenterology. The patient, patient's son okay with the upper endoscopy and evaluation if she has anypepticulcer. # Shingles on under right breast: Continue airborne and contact precautions, started on Valtrex 1 g by mouth 3 times a day.  #3 history of colon cancer, breast cancer: . Will need to discuss CODE STATUS.  All the records are reviewed and case discussed with ED provider. Management plans discussed with the patient, family and they are in agreement.  CODE STATUS: full  TOTAL TIME TAKING CARE OF THIS PATIENT: 55 minutes.    Epifanio Lesches M.D on 08/21/2016 at 7:43 PM  Between 7am to 6pm - Pager - [phone]  After 6pm go to [email or web address] - password [email or web address] [facility]  Office  [phone]  CC: Primary care physician; [name], MD  Note: This dictation was prepared with Dragon dictation along with smaller phrase technology. Any transcriptional errors that result from this process are unintentional.

## 2016-08-21 NOTE — ED Provider Notes (Signed)
[facility] Emergency Department Provider Note   ____________________________________________   I have reviewed the triage vital signs and the nursing notes.   HISTORY  Chief Complaint Chest Pain   History limited by: Not Limited   HPI [name] is a 83 y.o. female who presents to the emergency department today because of concerns for shortness breath and chest pain. The shortness of breath has been getting progressively worse for at least the past 3 weeks. It is worse with exertion. In addition the patient had had episodes of chest pain. The patient states she has noticed some dark stools however has been taking Pepto-Bismol as well. Does have a history of requiring a blood transfusion roughly 7 years ago secondary to intestinal loss.   Past Medical History:  Diagnosis Date  . Arthritis    back  . Benign neoplasm of skin, site unspecified   . Bowel trouble   . Cancer ([place])    colon 20 years ago  . Cancer of breast [facility]) April 10,.2013   Left breast, 3.8 cm, 4 cm axillary node, T2, N1a, ER negative PR negative, HER-2/neu not amplified.  . Hard of hearing   . Heartburn   . Hiatal hernia   . History of stomach ulcers   . Hypertension 2012  . Personal history of malignant neoplasm of breast 2013   LEFT MASTECTOMY,left modified radical mastectomy on September 06, 2011 483.8 cm primary tumor with a 4.6 cm axillary metastasis. 3 of 13 nodes were positive. T2,N1a lesion. This was a triple negative lesion  . Shingles Dec 2015  . Vaginal atrophy 08/10/2015    Patient Active Problem List   Diagnosis Date Noted  . Hereditary and idiopathic peripheral neuropathy 09/09/2015  . Malignant neoplasm of left female breast ([place]) 09/09/2015  . Prolapse of vaginal wall with midline cystocele 08/10/2015  . Incomplete bladder emptying 08/10/2015  . Midline low back pain without sciatica 08/10/2015  . Fecal incontinence 08/10/2015  . Recurrent UTI 07/19/2015  .  Atrophic vaginitis 07/19/2015  . Adenomatous colon polyp 02/24/2015  . Absolute anemia 02/24/2015  . Chronic infection of sinus 02/24/2015  . DD (diverticular disease) 02/24/2015  . Esophagitis 02/24/2015  . Acid reflux 02/24/2015  . 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 Breast cancer, left breast ([place]) 09/05/2012  . Hypertension     Past Surgical History:  Procedure Laterality Date  . ABDOMINAL HYSTERECTOMY     42 YEARS AGO  . BREAST SURGERY Left 09-06-11   left modified radical mastectomy on September 06, 2011 483.8 cm primary tumor with a 4.6 cm axillary metastasis. 3 of 13 nodes were positive. T2,N1a lesion. This was a triple negative lesion  . CARPAL TUNNEL RELEASE Right September 25, 2014   Skip Estimable, M.D.  . COLON SURGERY  20 YEARS AGO  . COLONOSCOPY  2012   DR. ELLIOTT  . MASTECTOMY Left 2013   left modified radical mastectomy on September 06, 2011 Stage 2; 3.8 cm primary tumor with a 4.6 cm axillary metastasis. 3 of 13 nodes were positive. T2,N1a lesion. This was a triple negative lesion  . UPPER GI ENDOSCOPY  2012   BLEEDING    Prior to Admission medications   Medication Sig Start Date End Date Taking? Authorizing Provider  Cranberry 500 MG CAPS Take by mouth daily.    Historical Provider, MD  estradiol (ESTRACE VAGINAL) 0.1 MG/GM vaginal cream Apply 0.'5mg'$  (pea-sized amount)  just inside the vaginal introitus with a finger-tip every night for two  weeks and then Monday, Wednesday and Friday nights. 07/19/15   Nori Riis, PA-C  gabapentin (NEURONTIN) 100 MG capsule Take on tablet each night for two weeks. Increase to two tablets nightly if no improvement. 09/09/15   Robert Bellow, MD  loperamide (IMODIUM A-D) 2 MG tablet Take 2 mg by mouth as needed for diarrhea or loose stools.    Historical Provider, MD  metoprolol succinate (TOPROL-XL) 25 MG 24 hr tablet Take 25 mg by mouth daily.    Historical Provider, MD  naproxen sodium (ANAPROX) 220 MG tablet Take 220 mg by mouth 2  (two) times daily with a meal.    Historical Provider, MD  omeprazole (PRILOSEC) 20 MG capsule Take 20 mg by mouth 2 (two) times daily.     Historical Provider, MD  OXYQUINOLONE SULFATE VAGINAL 0.025 % GEL Place 1 Applicatorful vaginally 2 (two) times a week. 09/09/15   Rubie Maid, MD  Potassium Gluconate 595 MG CAPS Take by mouth daily.    Historical Provider, MD  Probiotic Product (ALIGN) 4 MG CAPS Take by mouth daily.    Historical Provider, MD  ranitidine (ZANTAC) 150 MG tablet Take by mouth. 02/14/16 02/13/17  Historical Provider, MD  temazepam (RESTORIL) 15 MG capsule Take 15 mg by mouth at bedtime.    Historical Provider, MD  triamcinolone (NASACORT ALLERGY 24HR) 55 MCG/ACT AERO nasal inhaler Place 2 sprays into the nose as needed.     Historical Provider, MD  vitamin B-12 (CYANOCOBALAMIN) 1000 MCG tablet Take 1,000 mcg by mouth daily.    Historical Provider, MD    Allergies Codeine  Family History  Problem Relation Age of Onset  . Lung cancer Brother     colono ca  . Skin cancer Daughter   . Leukemia Father   . Colon cancer Mother   . Skin cancer Son   . Kidney disease Neg Hx   . Bladder Cancer Neg Hx     Social History Social History  Substance Use Topics  . Smoking status: Never Smoker  . Smokeless tobacco: Never Used  . Alcohol use No    Review of Systems  Constitutional: Negative for fever. Cardiovascular: Positive for chest pain. Respiratory: Positive for shortness of breath. Gastrointestinal: Positive for dark stool. Neurological: Negative for headaches, focal weakness or numbness.  10-point ROS otherwise negative.  ____________________________________________   PHYSICAL EXAM:  VITAL SIGNS: ED Triage Vitals  Enc Vitals Group     BP 08/21/16 1525 (!) 123/59     Pulse Rate 08/21/16 1525 75     Resp 08/21/16 1525 18     Temp 08/21/16 1525 98 F (36.7 C)     Temp Source 08/21/16 1525 Oral     SpO2 08/21/16 1525 97 %     Weight 08/21/16 1526 150 lb  (68 kg)     Height 08/21/16 1526 '5\' 6"'$  (1.676 m)     Head Circumference --      Peak Flow --      Pain Score 08/21/16 1525 5    Constitutional: Alert and oriented. Well appearing and in no distress. Eyes: Conjunctivae are normal. Normal extraocular movements. ENT   Head: Normocephalic and atraumatic.   Nose: No congestion/rhinnorhea.   Mouth/Throat: Mucous membranes are moist.   Neck: No stridor. Hematological/Lymphatic/Immunilogical: No cervical lymphadenopathy. Cardiovascular: Normal rate, regular rhythm.  No murmurs, rubs, or gallops.  Respiratory: Normal respiratory effort without tachypnea nor retractions. Breath sounds are clear and equal bilaterally. No wheezes/rales/rhonchi. Gastrointestinal: Soft and  non tender. No rebound. No guarding. GUIAC positive. No red blood on glove.  Genitourinary: Deferred Musculoskeletal: Normal range of motion in all extremities. No lower extremity edema. Neurologic:  Normal speech and language. No gross focal neurologic deficits are appreciated.  Skin:  Skin is warm, dry and intact. No rash noted. Psychiatric: Mood and affect are normal. Speech and behavior are normal. Patient exhibits appropriate insight and judgment.  ____________________________________________    LABS (pertinent positives/negatives)  Labs Reviewed  BASIC METABOLIC PANEL - Abnormal; Notable for the following:       Result Value   Glucose, Bld 113 (*)    Calcium 8.5 (*)    All other components within normal limits  CBC - Abnormal; Notable for the following:    RBC 3.05 (*)    Hemoglobin 6.8 (*)    HCT 22.0 (*)    MCV 72.1 (*)    MCH 22.4 (*)    MCHC 31.1 (*)    RDW 17.8 (*)    All other components within normal limits  TROPONIN I     ____________________________________________   EKG  I, Nance Pear, attending physician, personally viewed and interpreted this EKG  EKG Time: 1531 Rate: 72 Rhythm: normal sinus rhythm Axis:  normal Intervals: qtc 462 QRS: narrow ST changes: no st elevation Impression: normal ekg   ____________________________________________    RADIOLOGY  CXR  IMPRESSION: Large hiatal hernia.  No acute abnormality noted.  ____________________________________________   PROCEDURES  Procedures  ____________________________________________   INITIAL IMPRESSION / ASSESSMENT AND PLAN / ED COURSE  Pertinent labs & imaging results that were available during my care of the patient were reviewed by me and considered in my medical decision making (see chart for details).  Patient presented to the emergency department today because of concerns for shortness breath and chest pain. Patient's hemoglobin was 6.8. Guaiac was positive. I think likely this represents a chronic loss. Will plan on giving patient blood and admission to the hospital.  ____________________________________________   FINAL CLINICAL IMPRESSION(S) / ED DIAGNOSES  Final diagnoses:  Anemia, unspecified type  Herpes zoster without complication  Shortness of breath     Note: This dictation was prepared with Dragon dictation. Any transcriptional errors that result from this process are unintentional     Nance Pear, MD 08/21/16 2039

## 2016-08-21 NOTE — ED Triage Notes (Signed)
Pt reports central non radiating chest pain that is worse when she lays down for three days. Pt reports increasing SOB.

## 2016-08-22 DIAGNOSIS — D509 Iron deficiency anemia, unspecified: Secondary | ICD-10-CM

## 2016-08-22 DIAGNOSIS — K921 Melena: Principal | ICD-10-CM

## 2016-08-22 DIAGNOSIS — Z791 Long term (current) use of non-steroidal anti-inflammatories (NSAID): Secondary | ICD-10-CM

## 2016-08-22 LAB — CBC
HCT: 28.7 % — ABNORMAL LOW (ref 35.0–47.0)
HCT: 32.2 % — ABNORMAL LOW (ref 35.0–47.0)
HEMATOCRIT: 28 % — AB (ref 35.0–47.0)
HEMOGLOBIN: 9.1 g/dL — AB (ref 12.0–16.0)
Hemoglobin: 9.2 g/dL — ABNORMAL LOW (ref 12.0–16.0)
Hemoglobin: 10.4 g/dL — ABNORMAL LOW (ref 12.0–16.0)
MCH: 23.8 pg — ABNORMAL LOW (ref 26.0–34.0)
MCH: 24.3 pg — AB (ref 26.0–34.0)
MCH: 24 pg — ABNORMAL LOW (ref 26.0–34.0)
MCHC: 32 g/dL (ref 32.0–36.0)
MCHC: 32.2 g/dL (ref 32.0–36.0)
MCHC: 32.6 g/dL (ref 32.0–36.0)
MCV: 74.2 fL — ABNORMAL LOW (ref 80.0–100.0)
MCV: 75.4 fL — ABNORMAL LOW (ref 80.0–100.0)
MCV: 73.6 fL — ABNORMAL LOW (ref 80.0–100.0)
PLATELETS: 329 10*3/uL (ref 150–440)
PLATELETS: 345 10*3/uL (ref 150–440)
Platelets: 323 10*3/uL (ref 150–440)
RBC: 3.87 MIL/uL (ref 3.80–5.20)
RBC: 4.27 MIL/uL (ref 3.80–5.20)
RBC: 3.8 MIL/uL (ref 3.80–5.20)
RDW: 18.1 % — AB (ref 11.5–14.5)
RDW: 18.3 % — ABNORMAL HIGH (ref 11.5–14.5)
RDW: 18.5 % — AB (ref 11.5–14.5)
WBC: 10.4 10*3/uL (ref 3.6–11.0)
WBC: 9.8 10*3/uL (ref 3.6–11.0)
WBC: 8.3 10*3/uL (ref 3.6–11.0)

## 2016-08-22 LAB — TROPONIN I

## 2016-08-22 LAB — IRON AND TIBC
IRON: 44 ug/dL (ref 28–170)
Saturation Ratios: 10 % — ABNORMAL LOW (ref 10.4–31.8)
TIBC: 446 ug/dL (ref 250–450)
UIBC: 402 ug/dL

## 2016-08-22 LAB — FERRITIN: Ferritin: 4 ng/mL — ABNORMAL LOW (ref 11–307)

## 2016-08-22 LAB — PREPARE RBC (CROSSMATCH)

## 2016-08-22 MED ORDER — MORPHINE SULFATE (PF) 4 MG/ML IV SOLN
2.0000 mg | INTRAVENOUS | Status: DC | PRN
  Administered 2016-08-22 – 2016-08-23 (×2): 2 mg via INTRAVENOUS
  Filled 2016-08-22 (×2): qty 1

## 2016-08-22 MED ORDER — GABAPENTIN 100 MG PO CAPS
100.0000 mg | ORAL_CAPSULE | Freq: Three times a day (TID) | ORAL | Status: DC
  Administered 2016-08-22 – 2016-08-23 (×3): 100 mg via ORAL
  Filled 2016-08-22 (×3): qty 1

## 2016-08-22 MED ORDER — NITROGLYCERIN 0.4 MG SL SUBL: 0.4000 mg | SUBLINGUAL_TABLET | SUBLINGUAL | Status: DC | PRN

## 2016-08-22 MED ORDER — ONDANSETRON HCL 4 MG/2ML IJ SOLN
4.0000 mg | Freq: Four times a day (QID) | INTRAMUSCULAR | Status: DC | PRN
  Administered 2016-08-22 – 2016-08-23 (×4): 4 mg via INTRAVENOUS
  Filled 2016-08-22 (×3): qty 2

## 2016-08-22 MED ORDER — HYDRALAZINE HCL 20 MG/ML IJ SOLN
10.0000 mg | Freq: Four times a day (QID) | INTRAMUSCULAR | Status: DC | PRN
  Administered 2016-08-22 (×2): 10 mg via INTRAVENOUS
  Filled 2016-08-22 (×2): qty 1

## 2016-08-22 MED ORDER — SODIUM CHLORIDE 0.9 % IV SOLN
INTRAVENOUS | Status: DC
  Administered 2016-08-22: 20:00:00 via INTRAVENOUS

## 2016-08-22 NOTE — Progress Notes (Signed)
Pt arrived to the floor via stretcher from ED. Family members at bedside. Telemetry monitor applied and called to CCMD. On airborne precautions, pt has shingles.Oriented to room and call bell

## 2016-08-22 NOTE — Consult Note (Signed)
[name] MD  [address] [place], [place] [address] Phone: [phone] Fax : [phone]  Consultation  Referring Provider:   Dr Posey Pronto  Primary Care Physician:  [name], MD Primary Gastroenterologist:  Dr.Elliot       Reason for Consultation:     GI bleed   Date of Admission:  08/21/2016 Date of Consultation:  08/22/2016         HPI:   [name] is a 83 y.o. female who is under the care of Dr Tiffany Kocher as an outpatient  H/o partial colectomy in 1984. She follows with Dr Tiffany Kocher for chronic diarrhea, she suffers from dementia. Last seen at the clinic in 02/2016 . She also has a history of GERD on Zantac BID. She has a prior history of breast, colon cancer . She was admitted yesterday for shortness of breath, shingles under her right breast , chest pain evaluation . On admission was noted to have a Hb of 6.8 ,MCV 72,ferritin of 4   was having black tarry stools.  Hb was 13.3 with MCV of 91  in 02/2016 .    When I went to see her , she had her family by her side and did admitt to taking nsaids on a regular basis for the past few weeks. Has had some black tarry stools on and off, history not very reliable. No clear abdominal pain . She kept saying she wanted to go home, wanted to advance her diet.     Past Medical History:  Diagnosis Date  . Arthritis    back  . Benign neoplasm of skin, site unspecified   . Bowel trouble   . Cancer ([place])    colon 20 years ago  . Cancer of breast [facility]) April 10,.2013   Left breast, 3.8 cm, 4 cm axillary node, T2, N1a, ER negative PR negative, HER-2/neu not amplified.  . Hard of hearing   . Heartburn   . Hiatal hernia   . History of stomach ulcers   . Hypertension 2012  . Personal history of malignant neoplasm of breast 2013   LEFT MASTECTOMY,left modified radical mastectomy on September 06, 2011 483.8 cm primary tumor with a 4.6 cm axillary metastasis. 3 of 13 nodes were positive. T2,N1a lesion. This was a triple negative lesion  . Shingles  Dec 2015  . Vaginal atrophy 08/10/2015    Past Surgical History:  Procedure Laterality Date  . ABDOMINAL HYSTERECTOMY     42 YEARS AGO  . BREAST SURGERY Left 09-06-11   left modified radical mastectomy on September 06, 2011 483.8 cm primary tumor with a 4.6 cm axillary metastasis. 3 of 13 nodes were positive. T2,N1a lesion. This was a triple negative lesion  . CARPAL TUNNEL RELEASE Right September 25, 2014   Skip Estimable, M.D.  . COLON SURGERY  20 YEARS AGO  . COLONOSCOPY  2012   DR. ELLIOTT  . MASTECTOMY Left 2013   left modified radical mastectomy on September 06, 2011 Stage 2; 3.8 cm primary tumor with a 4.6 cm axillary metastasis. 3 of 13 nodes were positive. T2,N1a lesion. This was a triple negative lesion  . UPPER GI ENDOSCOPY  2012   BLEEDING    Prior to Admission medications   Medication Sig Start Date End Date Taking? Authorizing Provider  colestipol (COLESTID) 1 g tablet Take 1 tablet by mouth 2 (two) times daily. 05/15/16  Yes Historical Provider, MD  Cranberry 500 MG CAPS Take by mouth daily.   Yes Historical Provider,  MD  gabapentin (NEURONTIN) 100 MG capsule Take on tablet each night for two weeks. Increase to two tablets nightly if no improvement. 09/09/15  Yes Robert Bellow, MD  metoprolol succinate (TOPROL-XL) 25 MG 24 hr tablet Take 25 mg by mouth daily.   Yes Historical Provider, MD  naproxen sodium (ANAPROX) 220 MG tablet Take 220 mg by mouth 2 (two) times daily with a meal.   Yes Historical Provider, MD  Potassium Gluconate 595 MG CAPS Take 1 capsule by mouth daily.    Yes Historical Provider, MD  Probiotic Product (ALIGN) 4 MG CAPS Take 1 capsule by mouth daily.    Yes Historical Provider, MD  ranitidine (ZANTAC) 150 MG tablet Take by mouth. 02/14/16 02/13/17 Yes Historical Provider, MD  temazepam (RESTORIL) 15 MG capsule Take 15 mg by mouth at bedtime.   Yes Historical Provider, MD  vitamin B-12 (CYANOCOBALAMIN) 1000 MCG tablet Take 1,000 mcg by mouth daily.   Yes Historical  Provider, MD  estradiol (ESTRACE VAGINAL) 0.1 MG/GM vaginal cream Apply 0.73m (pea-sized amount)  just inside the vaginal introitus with a finger-tip every night for two weeks and then Monday, Wednesday and Friday nights. 07/19/15   SNori Riis PA-C  loperamide (IMODIUM A-D) 2 MG tablet Take 2 mg by mouth as needed for diarrhea or loose stools.    Historical Provider, MD  OXYQUINOLONE SULFATE VAGINAL 0.025 % GEL Place 1 Applicatorful vaginally 2 (two) times a week. 09/09/15   ARubie Maid MD  triamcinolone (NASACORT ALLERGY 24HR) 55 MCG/ACT AERO nasal inhaler Place 2 sprays into the nose as needed.     Historical Provider, MD    Family History  Problem Relation Age of Onset  . Lung cancer Brother     colono ca  . Skin cancer Daughter   . Leukemia Father   . Colon cancer Mother   . Skin cancer Son   . Kidney disease Neg Hx   . Bladder Cancer Neg Hx      Social History  Substance Use Topics  . Smoking status: Never Smoker  . Smokeless tobacco: Never Used  . Alcohol use No    Allergies as of 08/21/2016 - Review Complete 08/21/2016  Allergen Reaction Noted  . Codeine Nausea And Vomiting 07/27/2012    Review of Systems:    All systems reviewed and negative except where noted in HPI.   Physical Exam:  Vital signs in last 24 hours: Temp:  [97.6 F (36.4 C)-98.2 F (36.8 C)] 97.6 F (36.4 C) (03/27 1145) Pulse Rate:  [75-96] 81 (03/27 1145) Resp:  [14-30] 16 (03/27 1145) BP: (123-191)/(59-85) 191/83 (03/27 1145) SpO2:  [95 %-100 %] 99 % (03/27 1145) Weight:  [150 lb (68 kg)-152 lb 3.2 oz (69 kg)] 152 lb 3.2 oz (69 kg) (03/26 2135)   General:   Pleasant, cooperative in NAD Head:  Normocephalic and atraumatic. Eyes:   No icterus.   Conjunctiva pink. PERRLA. Ears:  Hard of hearing  Neck:  Supple; no masses or thyroidomegaly Lungs: Respirations even and unlabored. Lungs clear to auscultation bilaterally.   No wheezes, crackles, or rhonchi.  Heart:  Regular rate and  rhythm;  Without murmur, clicks, rubs or gallops Abdomen:  Soft, nondistended, nontender. Normal bowel sounds. No appreciable masses or hepatomegaly.  No rebound or guarding.  Rectal:  Not performed. Extremities:  Without edema, cyanosis or clubbing. Neurologic:  Alert and oriented x1-2;  grossly normal neurologically. Skin:  Intact without significant lesions or rashes. Cervical Nodes:  No significant cervical adenopathy. Psych:  Alert and cooperative. Poor memory   LAB RESULTS:  Recent Labs  08/21/16 1530 08/22/16 1045  WBC 6.6 10.4  HGB 6.8* 9.2*  HCT 22.0* 28.7*  PLT 385 329   BMET  Recent Labs  08/21/16 1530  NA 139  K 4.0  CL 109  CO2 23  GLUCOSE 113*  BUN 16  CREATININE 0.71  CALCIUM 8.5*   LFT No results for input(s): PROT, ALBUMIN, AST, ALT, ALKPHOS, BILITOT, BILIDIR, IBILI in the last 72 hours. PT/INR No results for input(s): LABPROT, INR in the last 72 hours.  STUDIES: Dg Chest 2 View  Result Date: 08/21/2016 CLINICAL DATA:  Chest pain and shortness of breath for several days EXAM: CHEST  2 VIEW COMPARISON:  08/07/2011 FINDINGS: Cardiac shadow is mildly enlarged. A large hiatal hernia is noted slightly increased from the prior study. The lungs are well aerated bilaterally. No focal infiltrate or sizable effusion is seen. Mild chronic interstitial changes are noted. No acute bony abnormality is seen. IMPRESSION: Large hiatal hernia. No acute abnormality noted. Electronically Signed   By: Inez Catalina M.D.   On: 08/21/2016 16:29      Impression / Plan:   [name] is a 83 y.o. y/o female with presents to the hospital with severe iron deficieny anemia , melena. Long term use of NSAID's  Plan  1. Stop all NSAID's 2. IV PPI 3. H pylori stool antigen  4. EGD in am, if negative will need a colonoscopy 5. IV iron     Thank you for involving me in the care of this patient.      LOS: 1 day   [name], MD  08/22/2016, 12:35 PM

## 2016-08-22 NOTE — Progress Notes (Signed)
[facility] [place] [facility]                                                                                                                                                                                  Patient Demographics   [name], is a 83 y.o. female, DOB - 03-28-1921, ERX:[id number]  Admit date - 08/21/2016   Admitting Physician Epifanio Lesches, MD  Outpatient Primary MD for the patient is [name], MD   LOS - 1  Subjective: Patient states that she wants to eat and wants to go home.    Review of Systems:   CONSTITUTIONAL: No documented fever. No fatigue, weakness. No weight gain, no weight loss.  EYES: No blurry or double vision.  ENT: No tinnitus. No postnasal drip. No redness of the oropharynx.  RESPIRATORY: No cough, no wheeze, no hemoptysis. No dyspnea.  CARDIOVASCULAR: No chest pain. No orthopnea. No palpitations. No syncope.  GASTROINTESTINAL: No nausea, no vomiting or diarrhea. No abdominal pain. No melena or hematochezia.  GENITOURINARY: No dysuria or hematuria.  ENDOCRINE: No polyuria or nocturia. No heat or cold intolerance.  HEMATOLOGY: No anemia. No bruising. No bleeding.  INTEGUMENTARY: No rashes. No lesions.  MUSCULOSKELETAL: No arthritis. No swelling. No gout.  NEUROLOGIC: No numbness, tingling, or ataxia. No seizure-type activity.  PSYCHIATRIC: No anxiety. No insomnia. No ADD.    Vitals:   Vitals:   08/22/16 0518 08/22/16 0610 08/22/16 0845 08/22/16 1145  BP: (!) 169/71 (!) 160/60 (!) 173/85 (!) 191/83  Pulse: 82 82 84 81  Resp: 17 16 18 16   Temp: 98 F (36.7 C) 98.2 F (36.8 C) 97.9 F (36.6 C) 97.6 F (36.4 C)  TempSrc: Oral Oral Oral Oral  SpO2: 98% 98% 97% 99%  Weight:      Height:        Wt Readings from Last 3 Encounters:  08/21/16 152 lb 3.2 oz (69 kg)  06/08/16 150 lb 6.4 oz (68.2 kg)  05/01/16 156 lb (70.8 kg)     Intake/Output Summary (Last 24 hours) at 08/22/16 1453 Last data filed at  08/22/16 0830  Gross per 24 hour  Intake              630 ml  Output              950 ml  Net             -320 ml    Physical Exam:   GENERAL: Pleasant-appearing in no apparent distress.  HEAD, EYES, EARS, NOSE AND THROAT: Atraumatic, normocephalic. Extraocular muscles are intact. Pupils equal and reactive to light. Sclerae anicteric. No conjunctival injection. No oro-pharyngeal  erythema.  NECK: Supple. There is no jugular venous distention. No bruits, no lymphadenopathy, no thyromegaly.  HEART: Regular rate and rhythm,. No murmurs, no rubs, no clicks.  LUNGS: Clear to auscultation bilaterally. No rales or rhonchi. No wheezes.  ABDOMEN: Soft, flat, nontender, nondistended. Has good bowel sounds. No hepatosplenomegaly appreciated.  EXTREMITIES: No evidence of any cyanosis, clubbing, or peripheral edema.  +2 pedal and radial pulses bilaterally.  NEUROLOGIC: The patient is alert, awake, and oriented x3 with no focal motor or sensory deficits appreciated bilaterally.  SKIN: Moist and warm with no rashes appreciated.  Psych: Not anxious, depressed LN: No inguinal LN enlargement    Antibiotics   Anti-infectives    Start     Dose/Rate Route Frequency Ordered Stop   08/21/16 2200  valACYclovir (VALTREX) tablet 1,000 mg     1,000 mg Oral Every 12 hours 08/21/16 1824     08/21/16 1815  valACYclovir (VALTREX) tablet 1,000 mg  Status:  Discontinued     1,000 mg Oral 3 times daily 08/21/16 1811 08/21/16 2359      Medications   Scheduled Meds: . sodium chloride  10 mL/hr Intravenous Once  . acidophilus  1 capsule Oral Daily  . colestipol  1 g Oral BID  . gabapentin  100 mg Oral TID  . metoprolol succinate  25 mg Oral Daily  . pantoprazole (PROTONIX) IV  40 mg Intravenous Q12H  . temazepam  15 mg Oral QHS  . valACYclovir  1,000 mg Oral Q12H  . vitamin B-12  1,000 mcg Oral Daily   Continuous Infusions: . sodium chloride     PRN Meds:.estradiol, hydrALAZINE   Data Review:   Micro  Results No results found for this or any previous visit (from the past 240 hour(s)).  Radiology Reports Dg Chest 2 View  Result Date: 08/21/2016 CLINICAL DATA:  Chest pain and shortness of breath for several days EXAM: CHEST  2 VIEW COMPARISON:  08/07/2011 FINDINGS: Cardiac shadow is mildly enlarged. A large hiatal hernia is noted slightly increased from the prior study. The lungs are well aerated bilaterally. No focal infiltrate or sizable effusion is seen. Mild chronic interstitial changes are noted. No acute bony abnormality is seen. IMPRESSION: Large hiatal hernia. No acute abnormality noted. Electronically Signed   By: Inez Catalina M.D.   On: 08/21/2016 16:29     CBC  Recent Labs Lab 08/21/16 1530 08/22/16 1045 08/22/16 1316  WBC 6.6 10.4 9.8  HGB 6.8* 9.2* 10.4*  HCT 22.0* 28.7* 32.2*  PLT 385 329 345  MCV 72.1* 74.2* 75.4*  MCH 22.4* 23.8* 24.3*  MCHC 31.1* 32.0 32.2  RDW 17.8* 18.1* 18.3*    Chemistries   Recent Labs Lab 08/21/16 1530  NA 139  K 4.0  CL 109  CO2 23  GLUCOSE 113*  BUN 16  CREATININE 0.71  CALCIUM 8.5*   ------------------------------------------------------------------------------------------------------------------ estimated creatinine clearance is 41.1 mL/min (by C-G formula based on SCr of 0.71 mg/dL). ------------------------------------------------------------------------------------------------------------------ No results for input(s): HGBA1C in the last 72 hours. ------------------------------------------------------------------------------------------------------------------ No results for input(s): CHOL, HDL, LDLCALC, TRIG, CHOLHDL, LDLDIRECT in the last 72 hours. ------------------------------------------------------------------------------------------------------------------ No results for input(s): TSH, T4TOTAL, T3FREE, THYROIDAB in the last 72 hours.  Invalid input(s):  FREET3 ------------------------------------------------------------------------------------------------------------------  Recent Labs  08/22/16 1045  FERRITIN 4*  TIBC 446  IRON 44    Coagulation profile No results for input(s): INR, PROTIME in the last 168 hours.  No results for input(s): DDIMER in the last 72 hours.  Cardiac  Enzymes  Recent Labs Lab 08/21/16 1530  TROPONINI <0.03   ------------------------------------------------------------------------------------------------------------------ Invalid input(s): POCBNP    Assessment & Plan   83 year old female patient with chest pain, shortness of breath secondary to GI bleed with black stool, symptomatic anemia. Assessment and plan #1; GI bleed Suspect chronic in nature and felt to be due to lower accordingly GI due to the microcytic nature of it. However her stools or tarry plan for endoscopy tomorrow morning start on a clear liquid diet  #Shingles on under right breast: Continue airborne and contact precautions, continue on Valtrex 1 g by mouth 3 times a day.  #3 history of colon cancer, breast cancer: .  #4 essential hypertension continue metoprolol     Code Status Orders        Start     Ordered   08/21/16 1818  Full code  Continuous     08/21/16 1824    Code Status History    Date Active Date Inactive Code Status Order ID Comments User Context   This patient has a current code status but no historical code status.    Advance Directive Documentation     Most Recent Value  Type of Advance Directive  Healthcare Power of Attorney  Pre-existing out of facility DNR order (yellow form or pink MOST form)  -  "MOST" Form in Place?  -           Consults risk  DVT Prophylaxis  scd's  Lab Results  Component Value Date   PLT 345 08/22/2016     Time Spent in minutes  85min  Greater than 50% of time spent in care coordination and counseling patient regarding the condition and plan of  care.   Dustin Flock M.D on 08/22/2016 at 2:53 PM  Between 7am to 6pm - Pager - [phone]  After 6pm go to [email or web address] - password [email or web address] [facility]   Office  [phone]

## 2016-08-22 NOTE — Progress Notes (Signed)
CH responded to an OR for an AD. Pt is awake but groggy. Pt states that she experienced a restless night and is hoping to sleep this morning. [place] asked if this is a good time to educate. Pt said it would be fine. [place] educated the Pt on the AD. Pt wanted to review the documents for a later completion. [place] left documents with the Pt and prayed for a more restful morning. CH is available for follow up as needed.    08/22/16 1000  Clinical Encounter Type  Visited With Patient  Visit Type Initial;Spiritual support  Referral From Nurse  Spiritual Encounters  Spiritual Needs Literature;Prayer

## 2016-08-22 NOTE — Progress Notes (Signed)
Patient resting in bed at this time , denies any chest pain , daughter at bedside , for possible EGD in am, npo after midnight , endorsed to night nurse

## 2016-08-22 NOTE — Progress Notes (Signed)
DR Vianne Bulls was informed about pt having active chest pain, with 11 beat of st runs at 1831 and elevated bp , ekg was done and place on pt's chart with MD made aware of the interpretation, also pt was place on 02 [place] 2 lit , and nitro SL order ,

## 2016-08-23 ENCOUNTER — Encounter
Admission: EM | Disposition: A | Discharge status: Home Health | Payer: Self-pay | Source: Home / Self Care | Attending: Internal Medicine

## 2016-08-23 ENCOUNTER — Inpatient Hospital Stay: Payer: Medicare Other | Admitting: Certified Registered Nurse Anesthetist

## 2016-08-23 DIAGNOSIS — K449 Diaphragmatic hernia without obstruction or gangrene: Secondary | ICD-10-CM

## 2016-08-23 DIAGNOSIS — K210 Gastro-esophageal reflux disease with esophagitis: Secondary | ICD-10-CM

## 2016-08-23 HISTORY — PX: ESOPHAGOGASTRODUODENOSCOPY (EGD) WITH PROPOFOL: SHX5813

## 2016-08-23 LAB — CBC
HCT: 28.6 % — ABNORMAL LOW (ref 35.0–47.0)
HEMOGLOBIN: 9.3 g/dL — AB (ref 12.0–16.0)
MCH: 23.9 pg — AB (ref 26.0–34.0)
MCHC: 32.4 g/dL (ref 32.0–36.0)
MCV: 73.8 fL — ABNORMAL LOW (ref 80.0–100.0)
PLATELETS: 290 10*3/uL (ref 150–440)
RBC: 3.88 MIL/uL (ref 3.80–5.20)
RDW: 18.9 % — ABNORMAL HIGH (ref 11.5–14.5)
WBC: 8.4 10*3/uL (ref 3.6–11.0)

## 2016-08-23 LAB — TYPE AND SCREEN
ABO/RH(D): A POS
Antibody Screen: NEGATIVE
UNIT DIVISION: 0
Unit division: 0

## 2016-08-23 LAB — BPAM RBC
BLOOD PRODUCT EXPIRATION DATE: 201804112359
BLOOD PRODUCT EXPIRATION DATE: 201804132359
ISSUE DATE / TIME: 201803262343
ISSUE DATE / TIME: 201803270523
UNIT TYPE AND RH: 6200
Unit Type and Rh: 6200

## 2016-08-23 SURGERY — ESOPHAGOGASTRODUODENOSCOPY (EGD) WITH PROPOFOL
Anesthesia: General

## 2016-08-23 MED ORDER — PROPOFOL 10 MG/ML IV BOLUS
INTRAVENOUS | Status: DC | PRN
  Administered 2016-08-23: 10 mg via INTRAVENOUS
  Administered 2016-08-23: 40 mg via INTRAVENOUS

## 2016-08-23 MED ORDER — PROPOFOL 10 MG/ML IV BOLUS
INTRAVENOUS | Status: AC
  Filled 2016-08-23: qty 20

## 2016-08-23 MED ORDER — SODIUM CHLORIDE 0.9 % IV SOLN
INTRAVENOUS | Status: DC
  Administered 2016-08-23: 1000 mL via INTRAVENOUS

## 2016-08-23 MED ORDER — SODIUM CHLORIDE 0.9 % IV SOLN
INTRAVENOUS | Status: DC
  Administered 2016-08-23 – 2016-08-24 (×2): via INTRAVENOUS

## 2016-08-23 MED ORDER — PROMETHAZINE HCL 25 MG/ML IJ SOLN
12.5000 mg | Freq: Four times a day (QID) | INTRAMUSCULAR | Status: DC | PRN
  Administered 2016-08-23: 12.5 mg via INTRAVENOUS
  Filled 2016-08-23: qty 1

## 2016-08-23 MED ORDER — PEG 3350-KCL-NA BICARB-NACL 420 G PO SOLR
4000.0000 mL | Freq: Once | ORAL | Status: AC
  Administered 2016-08-23: 4000 mL via ORAL
  Filled 2016-08-23: qty 4000

## 2016-08-23 MED ORDER — GABAPENTIN 100 MG PO CAPS
200.0000 mg | ORAL_CAPSULE | Freq: Three times a day (TID) | ORAL | Status: DC
  Administered 2016-08-23 – 2016-08-25 (×5): 200 mg via ORAL
  Filled 2016-08-23 (×6): qty 2

## 2016-08-23 MED ORDER — PROPOFOL 10 MG/ML IV BOLUS: INTRAVENOUS | Status: DC | PRN

## 2016-08-23 NOTE — Anesthesia Procedure Notes (Signed)
Date/Time: 08/23/2016 12:15 PM Performed by: Nelda Marseille Pre-anesthesia Checklist: Patient identified, Emergency Drugs available, Suction available, Patient being monitored and Timeout performed Oxygen Delivery Method: Nasal cannula

## 2016-08-23 NOTE — Op Note (Signed)
[facility] Patient Name: [name] Procedure Date: 08/23/2016 12:00 PM MRN: [id number] Account #: [id number] Date of Birth: [birth year] Admit Type: Inpatient Age: 83 Room: [facility] ENDO ROOM 1 Gender: Female Note Status: Finalized Procedure:            Upper GI endoscopy Indications:          Iron deficiency anemia Providers:            [name] MD, MD Referring MD:         Irven Easterly. Kary Kos, MD (Referring MD) Medicines:            Monitored Anesthesia Care Complications:        No immediate complications. Procedure:            Pre-Anesthesia Assessment:                       - Prior to the procedure, a History and Physical was                        performed, and patient medications, allergies and                        sensitivities were reviewed. The patient's tolerance of                        previous anesthesia was reviewed.                       - The risks and benefits of the procedure and the                        sedation options and risks were discussed with the                        patient. All questions were answered and informed                        consent was obtained.                       - ASA Grade Assessment: III - A patient with severe                        systemic disease.                       After obtaining informed consent, the endoscope was                        passed under direct vision. Throughout the procedure,                        the patient's blood pressure, pulse, and oxygen                        saturations were monitored continuously. The Endoscope                        was introduced through the mouth, and advanced to the  third part of duodenum. The upper GI endoscopy was                        accomplished with ease. The patient tolerated the                        procedure well. Findings:      The examined duodenum was normal. Biopsies for histology were taken with       a cold forceps for evaluation of celiac disease.      A 6 cm hiatal hernia was present.      LA Grade A (one or more mucosal breaks less than 5 mm, not extending       between tops of 2 mucosal folds) esophagitis with no bleeding was found       33 to 36 cm from the incisors. Biopsies were taken with a cold forceps       for histology. Impression:           - Normal examined duodenum. Biopsied.                       - 6 cm hiatal hernia.                       - LA Grade A reflux esophagitis. Biopsied. Recommendation:       - Return patient to hospital ward for ongoing care.                       - Clear liquid diet today.                       - Continue present medications.                       - Await pathology results.                       - Perform a colonoscopy tomorrow.                       - If she is willing for a colonoscopy , I can perform                        tomorrow Procedure Code(s):    --- Professional ---                       [phone], Esophagogastroduodenoscopy, flexible, transoral;                        with biopsy, single or multiple Diagnosis Code(s):    --- Professional ---                       K44.9, Diaphragmatic hernia without obstruction or                        gangrene                       K21.0, Gastro-esophageal reflux disease with esophagitis                       D50.9, Iron deficiency anemia,  unspecified CPT copyright 2016 American Medical Association. All rights reserved. The codes documented in this report are preliminary and upon coder review may  be revised to meet current compliance requirements. [name], MD [name] MD, MD 08/23/2016 12:20:29 PM This report has been signed electronically. Number of Addenda: 0 Note Initiated On: 08/23/2016 12:00 PM      [facility]

## 2016-08-23 NOTE — Plan of Care (Signed)
Problem: Pain Managment: Goal: General experience of comfort will improve Outcome: Not Progressing Patient complains of pain and nausea. Morphine and phenergan given.

## 2016-08-23 NOTE — Progress Notes (Signed)
EGD- normal except for mild esophagitis and large hiatal hernia. Since no clear etiology found for anemia next step would be a colonoscopy if she is willing to do the prep.    I will put her on the schedule with the plan she is going to do the prep. If she says no then we can cancell it  Dr [name]  Gastroenterology/Hepatology Pager: [phone]

## 2016-08-23 NOTE — Care Management Important Message (Signed)
Important Message  Patient Details  Name: [name] MRN: [id number] Date of Birth: 1920-12-03   Medicare Important Message Given:  Yes  Signed copy of notice given to patient    Katrina Stack, RN 08/23/2016, 9:46 AM

## 2016-08-23 NOTE — Anesthesia Preprocedure Evaluation (Signed)
Anesthesia Evaluation  Patient identified by MRN, date of birth, ID band Patient awake    Reviewed: Allergy & Precautions, H&P , NPO status , Patient's Chart, lab work & pertinent test results, reviewed documented beta blocker date and time   Airway Mallampati: III   Neck ROM: full    Dental  (+) Poor Dentition   Pulmonary neg pulmonary ROS,    Pulmonary exam normal        Cardiovascular hypertension, negative cardio ROS Normal cardiovascular exam Rhythm:regular Rate:Normal     Neuro/Psych  Neuromuscular disease negative neurological ROS  negative psych ROS   GI/Hepatic negative GI ROS, Neg liver ROS, hiatal hernia, GERD  ,  Endo/Other  negative endocrine ROS  Renal/GU negative Renal ROS  negative genitourinary   Musculoskeletal negative musculoskeletal ROS (+) Arthritis ,   Abdominal   Peds negative pediatric ROS (+)  Hematology negative hematology ROS (+) anemia ,   Anesthesia Other Findings Past Medical History: No date: Arthritis     Comment: back No date: Benign neoplasm of skin, site unspecified No date: Bowel trouble No date: Cancer [facility])     Comment: colon 20 years ago April 10,.2013: Cancer of breast [facility])     Comment: Left breast, 3.8 cm, 4 cm axillary node, T2,               N1a, ER negative PR negative, HER-2/neu not               amplified. No date: Hard of hearing No date: Heartburn No date: Hiatal hernia No date: History of stomach ulcers 2012: Hypertension 2013: Personal history of malignant neoplasm of brea*     Comment: LEFT MASTECTOMY,left modified radical               mastectomy on September 06, 2011 483.8 cm primary               tumor with a 4.6 cm axillary metastasis. 3 of               13 nodes were positive. T2,N1a lesion. This was              a triple negative lesion Dec 2015: Shingles 08/10/2015: Vaginal atrophy Past Surgical History: No date: ABDOMINAL HYSTERECTOMY  Comment: 42 YEARS AGO 09-06-11: BREAST SURGERY Left     Comment: left modified radical mastectomy on September 06, 2011 483.8 cm primary tumor with a 4.6 cm               axillary metastasis. 3 of 13 nodes were               positive. T2,N1a lesion. This was a triple               negative lesion September 25, 2014: CARPAL TUNNEL RELEASE Right     Comment: Skip Estimable, M.D. 20 YEARS AGO: COLON SURGERY 2012: COLONOSCOPY     Comment: DR. Vira Agar 2013: MASTECTOMY Left     Comment: left modified radical mastectomy on September 06, 2011 Stage 2; 3.8 cm primary tumor with a 4.6               cm axillary metastasis. 3 of 13 nodes were               positive. T2,N1a lesion. This  was a triple               negative lesion 2012: UPPER GI ENDOSCOPY     Comment: BLEEDING BMI    Body Mass Index:  24.57 kg/m     Reproductive/Obstetrics negative OB ROS                            Anesthesia Physical Anesthesia Plan  ASA: III  Anesthesia Plan: General   Post-op Pain Management:    Induction:   Airway Management Planned:   Additional Equipment:   Intra-op Plan:   Post-operative Plan:   Informed Consent: I have reviewed the patients History and Physical, chart, labs and discussed the procedure including the risks, benefits and alternatives for the proposed anesthesia with the patient or authorized representative who has indicated his/her understanding and acceptance.   Dental Advisory Given  Plan Discussed with: CRNA  Anesthesia Plan Comments:         Anesthesia Quick Evaluation

## 2016-08-23 NOTE — H&P (Addendum)
[name] MD [address], [place], [place] [address] Phone: [phone] Fax : [phone]  Primary Care Physician:  [name], MD Primary Gastroenterologist:  Dr. [name]   Pre-Procedure History & Physical: HPI:  [name] is a 83 y.o. female is here for an endoscopy.   Past Medical History:  Diagnosis Date  . Arthritis    back  . Benign neoplasm of skin, site unspecified   . Bowel trouble   . Cancer ([place])    colon 20 years ago  . Cancer of breast [facility]) April 10,.2013   Left breast, 3.8 cm, 4 cm axillary node, T2, N1a, ER negative PR negative, HER-2/neu not amplified.  . Hard of hearing   . Heartburn   . Hiatal hernia   . History of stomach ulcers   . Hypertension 2012  . Personal history of malignant neoplasm of breast 2013   LEFT MASTECTOMY,left modified radical mastectomy on September 06, 2011 483.8 cm primary tumor with a 4.6 cm axillary metastasis. 3 of 13 nodes were positive. T2,N1a lesion. This was a triple negative lesion  . Shingles Dec 2015  . Vaginal atrophy 08/10/2015    Past Surgical History:  Procedure Laterality Date  . ABDOMINAL HYSTERECTOMY     42 YEARS AGO  . BREAST SURGERY Left 09-06-11   left modified radical mastectomy on September 06, 2011 483.8 cm primary tumor with a 4.6 cm axillary metastasis. 3 of 13 nodes were positive. T2,N1a lesion. This was a triple negative lesion  . CARPAL TUNNEL RELEASE Right September 25, 2014   Skip Estimable, M.D.  . COLON SURGERY  20 YEARS AGO  . COLONOSCOPY  2012   DR. ELLIOTT  . MASTECTOMY Left 2013   left modified radical mastectomy on September 06, 2011 Stage 2; 3.8 cm primary tumor with a 4.6 cm axillary metastasis. 3 of 13 nodes were positive. T2,N1a lesion. This was a triple negative lesion  . UPPER GI ENDOSCOPY  2012   BLEEDING    Prior to Admission medications   Medication Sig Start Date End Date Taking? Authorizing Provider  colestipol (COLESTID) 1 g tablet Take 1 tablet by mouth 2 (two) times  daily. 05/15/16  Yes Historical Provider, MD  Cranberry 500 MG CAPS Take by mouth daily.   Yes Historical Provider, MD  gabapentin (NEURONTIN) 100 MG capsule Take on tablet each night for two weeks. Increase to two tablets nightly if no improvement. 09/09/15  Yes Robert Bellow, MD  metoprolol succinate (TOPROL-XL) 25 MG 24 hr tablet Take 25 mg by mouth daily.   Yes Historical Provider, MD  naproxen sodium (ANAPROX) 220 MG tablet Take 220 mg by mouth 2 (two) times daily with a meal.   Yes Historical Provider, MD  Potassium Gluconate 595 MG CAPS Take 1 capsule by mouth daily.    Yes Historical Provider, MD  Probiotic Product (ALIGN) 4 MG CAPS Take 1 capsule by mouth daily.    Yes Historical Provider, MD  ranitidine (ZANTAC) 150 MG tablet Take by mouth. 02/14/16 02/13/17 Yes Historical Provider, MD  temazepam (RESTORIL) 15 MG capsule Take 15 mg by mouth at bedtime.   Yes Historical Provider, MD  vitamin B-12 (CYANOCOBALAMIN) 1000 MCG tablet Take 1,000 mcg by mouth daily.   Yes Historical Provider, MD  estradiol (ESTRACE VAGINAL) 0.1 MG/GM vaginal cream Apply 0.72m (pea-sized amount)  just inside the vaginal introitus with a finger-tip every night for two weeks and then Monday, Wednesday and Friday nights. 07/19/15  Larene Beach A McGowan, PA-C  loperamide (IMODIUM A-D) 2 MG tablet Take 2 mg by mouth as needed for diarrhea or loose stools.    Historical Provider, MD  OXYQUINOLONE SULFATE VAGINAL 0.025 % GEL Place 1 Applicatorful vaginally 2 (two) times a week. 09/09/15   Rubie Maid, MD  triamcinolone (NASACORT ALLERGY 24HR) 55 MCG/ACT AERO nasal inhaler Place 2 sprays into the nose as needed.     Historical Provider, MD    Allergies as of 08/21/2016 - Review Complete 08/21/2016  Allergen Reaction Noted  . Codeine Nausea And Vomiting 07/27/2012    Family History  Problem Relation Age of Onset  . Lung cancer Brother     colono ca  . Skin cancer Daughter   . Leukemia Father   . Colon cancer Mother     . Skin cancer Son   . Kidney disease Neg Hx   . Bladder Cancer Neg Hx     Social History   Social History  . Marital status: Widowed    Spouse name: N/A  . Number of children: N/A  . Years of education: N/A   Occupational History  . Not on file.   Social History Main Topics  . Smoking status: Never Smoker  . Smokeless tobacco: Never Used  . Alcohol use No  . Drug use: No  . Sexual activity: No   Other Topics Concern  . Not on file   Social History Narrative  . No narrative on file    Review of Systems: See HPI, otherwise negative ROS  Physical Exam: BP 125/60 (BP Location: Right Arm)   Pulse 79   Temp 97.8 F (36.6 C) (Oral)   Resp 17   Ht _0  (1.676 m)   Wt 152 lb 3.2 oz (69 kg)   SpO2 97%   BMI 24.57 kg/m  General:   Alert,  pleasant and cooperative in NAD Head:  Normocephalic and atraumatic. Neck:  Supple; no masses or thyromegaly. Lungs:  Clear throughout to auscultation.    Heart:  Regular rate and rhythm. Abdomen:  Soft, nontender and nondistended. Normal bowel sounds, without guarding, and without rebound.   Neurologic:  Poor memory   Impression/Plan: [name] is here for an endoscopy to be performed for GI bleed   Risks, benefits, limitations, and alternatives regarding  endoscopy have been reviewed with the patient.  Questions have been answered.  All parties agreeable.   [name], MD  08/23/2016, 11:49 AM

## 2016-08-23 NOTE — Progress Notes (Signed)
[facility] [place] [facility]                                                                                                                                                                                  Patient Demographics   [name], is a 83 y.o. female, DOB - 1921-03-17, GUY:[id number]  Admit date - 08/21/2016   Admitting Physician Epifanio Lesches, MD  Outpatient Primary MD for the patient is [name], MD   LOS - 2  Subjective: Pt had egd which showed reflux esophagitis,  Patient c/o chest pain, also nausea    Review of Systems:   CONSTITUTIONAL: No documented fever. No fatigue, weakness. No weight gain, no weight loss.  EYES: No blurry or double vision.  ENT: No tinnitus. No postnasal drip. No redness of the oropharynx.  RESPIRATORY: No cough, no wheeze, no hemoptysis. No dyspnea.  CARDIOVASCULAR:  +chest pain. No orthopnea. No palpitations. No syncope.  GASTROINTESTINAL: + nausea, no vomiting or diarrhea. No abdominal pain. No melena or hematochezia.  GENITOURINARY: No dysuria or hematuria.  ENDOCRINE: No polyuria or nocturia. No heat or cold intolerance.  HEMATOLOGY: No anemia. No bruising. No bleeding.  INTEGUMENTARY: No rashes. No lesions.  MUSCULOSKELETAL: No arthritis. No swelling. No gout.  NEUROLOGIC: No numbness, tingling, or ataxia. No seizure-type activity.  PSYCHIATRIC: No anxiety. No insomnia. No ADD.    Vitals:   Vitals:   08/23/16 0809 08/23/16 0912 08/23/16 1149 08/23/16 1227  BP: 125/60  (!) 161/75 (!) 139/54  Pulse: 75 79 83   Resp: 17  18   Temp: 97.8 F (36.6 C)  98.2 F (36.8 C) 99.5 F (37.5 C)  TempSrc: Oral  Oral Tympanic  SpO2: 98% 97% 97%   Weight:      Height:        Wt Readings from Last 3 Encounters:  08/21/16 152 lb 3.2 oz (69 kg)  06/08/16 150 lb 6.4 oz (68.2 kg)  05/01/16 156 lb (70.8 kg)     Intake/Output Summary (Last 24 hours) at 08/23/16 1549 Last data filed at 08/23/16 1224   Gross per 24 hour  Intake              100 ml  Output             2525 ml  Net            -2425 ml    Physical Exam:   GENERAL: Pleasant-appearing in no apparent distress.  HEAD, EYES, EARS, NOSE AND THROAT: Atraumatic, normocephalic. Extraocular muscles are intact. Pupils equal and reactive to light. Sclerae anicteric. No conjunctival injection. No oro-pharyngeal erythema.  NECK: Supple. There  is no jugular venous distention. No bruits, no lymphadenopathy, no thyromegaly.  HEART: Regular rate and rhythm,. No murmurs, no rubs, no clicks.  LUNGS: Clear to auscultation bilaterally. No rales or rhonchi. No wheezes.  ABDOMEN: Soft, flat, nontender, nondistended. Has good bowel sounds. No hepatosplenomegaly appreciated.  EXTREMITIES: No evidence of any cyanosis, clubbing, or peripheral edema.  +2 pedal and radial pulses bilaterally.  NEUROLOGIC: The patient is alert, awake, and oriented x3 with no focal motor or sensory deficits appreciated bilaterally.  SKIN: Moist and warm with no rashes appreciated.  Psych: Not anxious, depressed LN: No inguinal LN enlargement    Antibiotics   Anti-infectives    Start     Dose/Rate Route Frequency Ordered Stop   08/21/16 2200  valACYclovir (VALTREX) tablet 1,000 mg     1,000 mg Oral Every 12 hours 08/21/16 1824     08/21/16 1815  valACYclovir (VALTREX) tablet 1,000 mg  Status:  Discontinued     1,000 mg Oral 3 times daily 08/21/16 1811 08/21/16 2359      Medications   Scheduled Meds: . acidophilus  1 capsule Oral Daily  . colestipol  1 g Oral BID  . gabapentin  100 mg Oral TID  . metoprolol succinate  25 mg Oral Daily  . pantoprazole (PROTONIX) IV  40 mg Intravenous Q12H  . polyethylene glycol-electrolytes  4,000 mL Oral Once  . temazepam  15 mg Oral QHS  . valACYclovir  1,000 mg Oral Q12H  . vitamin B-12  1,000 mcg Oral Daily   Continuous Infusions: . sodium chloride    . sodium chloride 20 mL/hr at 08/23/16 1347   PRN Meds:.estradiol,  hydrALAZINE, morphine injection, nitroGLYCERIN, ondansetron (ZOFRAN) IV, promethazine   Data Review:   Micro Results No results found for this or any previous visit (from the past 240 hour(s)).  Radiology Reports Dg Chest 2 View  Result Date: 08/21/2016 CLINICAL DATA:  Chest pain and shortness of breath for several days EXAM: CHEST  2 VIEW COMPARISON:  08/07/2011 FINDINGS: Cardiac shadow is mildly enlarged. A large hiatal hernia is noted slightly increased from the prior study. The lungs are well aerated bilaterally. No focal infiltrate or sizable effusion is seen. Mild chronic interstitial changes are noted. No acute bony abnormality is seen. IMPRESSION: Large hiatal hernia. No acute abnormality noted. Electronically Signed   By: Inez Catalina M.D.   On: 08/21/2016 16:29     CBC  Recent Labs Lab 08/21/16 1530 08/22/16 1045 08/22/16 1316 08/22/16 1753 08/23/16 0956  WBC 6.6 10.4 9.8 8.3 8.4  HGB 6.8* 9.2* 10.4* 9.1* 9.3*  HCT 22.0* 28.7* 32.2* 28.0* 28.6*  PLT 385 329 345 323 290  MCV 72.1* 74.2* 75.4* 73.6* 73.8*  MCH 22.4* 23.8* 24.3* 24.0* 23.9*  MCHC 31.1* 32.0 32.2 32.6 32.4  RDW 17.8* 18.1* 18.3* 18.5* 18.9*    Chemistries   Recent Labs Lab 08/21/16 1530  NA 139  K 4.0  CL 109  CO2 23  GLUCOSE 113*  BUN 16  CREATININE 0.71  CALCIUM 8.5*   ------------------------------------------------------------------------------------------------------------------ estimated creatinine clearance is 41.1 mL/min (by C-G formula based on SCr of 0.71 mg/dL). ------------------------------------------------------------------------------------------------------------------ No results for input(s): HGBA1C in the last 72 hours. ------------------------------------------------------------------------------------------------------------------ No results for input(s): CHOL, HDL, LDLCALC, TRIG, CHOLHDL, LDLDIRECT in the last 72  hours. ------------------------------------------------------------------------------------------------------------------ No results for input(s): TSH, T4TOTAL, T3FREE, THYROIDAB in the last 72 hours.  Invalid input(s): FREET3 ------------------------------------------------------------------------------------------------------------------  Recent Labs  08/22/16 1045  FERRITIN 4*  TIBC 446  IRON 44    Coagulation profile No results for input(s): INR, PROTIME in the last 168 hours.  No results for input(s): DDIMER in the last 72 hours.  Cardiac Enzymes  Recent Labs Lab 08/21/16 1530 08/22/16 1045  TROPONINI <0.03 <0.03   ------------------------------------------------------------------------------------------------------------------ Invalid input(s): POCBNP    Assessment & Plan   83 year old female patient with chest pain, shortness of breath secondary to GI bleed with black stool, symptomatic anemia. Assessment and plan  # GI bleed Suspect chronic in nature and felt to be due to lower accordingly GI due to the microcytic nature of it. Patient doesn't want a colonoscopy however her son has convinced her to get a colonoscopy  #Shingles on under right breast: Continue airborne and contact precautions, continue on Valtrex 1 g by mouth 3 times a day. Start on lyrica  # history of colon cancer, breast cancer: .  #essential hypertension continue metoprolol     Code Status Orders        Start     Ordered   08/21/16 1818  Full code  Continuous     08/21/16 1824    Code Status History    Date Active Date Inactive Code Status Order ID Comments User Context   This patient has a current code status but no historical code status.    Advance Directive Documentation     Most Recent Value  Type of Advance Directive  Healthcare Power of Attorney  Pre-existing out of facility DNR order (yellow form or pink MOST form)  -  "MOST" Form in Place?  -            Consults risk  DVT Prophylaxis  scd's  Lab Results  Component Value Date   PLT 290 08/23/2016     Time Spent in minutes  72min  Greater than 50% of time spent in care coordination and counseling patient regarding the condition and plan of care.   Dustin Flock M.D on 08/23/2016 at 3:49 PM  Between 7am to 6pm - Pager - [phone]  After 6pm go to [email or web address] - password [email or web address] [facility]   Office  [phone]

## 2016-08-23 NOTE — Transfer of Care (Signed)
Immediate Anesthesia Transfer of Care Note  Patient: [name]  Procedure(s) Performed: Procedure(s): ESOPHAGOGASTRODUODENOSCOPY (EGD) WITH PROPOFOL (N/A)  Patient Location: PACU  Anesthesia Type:General  Level of Consciousness: awake, alert  and sedated  Airway & Oxygen Therapy: Patient Spontanous Breathing and Patient connected to nasal cannula oxygen  Post-op Assessment: Report given to RN and Post -op Vital signs reviewed and stable  Post vital signs: Reviewed and stable  Last Vitals:  Vitals:   08/23/16 0912 08/23/16 1149  BP:  (!) 161/75  Pulse: 79 83  Resp:  18  Temp:  36.8 C    Last Pain:  Vitals:   08/23/16 1149  TempSrc: Oral  PainSc: 0-No pain      Patients Stated Pain Goal: 0 (28/83/37 4451)  Complications: No apparent anesthesia complications

## 2016-08-23 NOTE — Anesthesia Post-op Follow-up Note (Cosign Needed)
Anesthesia QCDR form completed.        

## 2016-08-23 NOTE — Anesthesia Postprocedure Evaluation (Signed)
Anesthesia Post Note  Patient: [name]  Procedure(s) Performed: Procedure(s) (LRB): ESOPHAGOGASTRODUODENOSCOPY (EGD) WITH PROPOFOL (N/A)  Patient location during evaluation: PACU Anesthesia Type: General Level of consciousness: awake and alert Pain management: pain level controlled Vital Signs Assessment: post-procedure vital signs reviewed and stable Respiratory status: spontaneous breathing, nonlabored ventilation, respiratory function stable and patient connected to nasal cannula oxygen Cardiovascular status: blood pressure returned to baseline and stable Postop Assessment: no signs of nausea or vomiting Anesthetic complications: no     Last Vitals:  Vitals:   08/23/16 1149 08/23/16 1227  BP: (!) 161/75 (!) 139/54  Pulse: 83   Resp: 18   Temp: 36.8 C     Last Pain:  Vitals:   08/23/16 1149  TempSrc: Oral  PainSc: 0-No pain                 Molli Barrows

## 2016-08-24 ENCOUNTER — Encounter: Payer: Self-pay | Admitting: Gastroenterology

## 2016-08-24 ENCOUNTER — Inpatient Hospital Stay
Admit: 2016-08-24 | Discharge: 2016-08-24 | Disposition: A | Discharge status: Home / Self Care | Payer: Medicare Other | Attending: Physician Assistant | Admitting: Physician Assistant

## 2016-08-24 ENCOUNTER — Encounter
Admission: EM | Disposition: A | Discharge status: Home Health | Payer: Self-pay | Source: Home / Self Care | Attending: Internal Medicine

## 2016-08-24 ENCOUNTER — Encounter: Payer: Self-pay | Admitting: Anesthesiology

## 2016-08-24 LAB — CBC
HEMATOCRIT: 26.8 % — AB (ref 35.0–47.0)
Hemoglobin: 8.5 g/dL — ABNORMAL LOW (ref 12.0–16.0)
MCH: 23.6 pg — ABNORMAL LOW (ref 26.0–34.0)
MCHC: 31.9 g/dL — ABNORMAL LOW (ref 32.0–36.0)
MCV: 73.8 fL — AB (ref 80.0–100.0)
PLATELETS: 278 10*3/uL (ref 150–440)
RBC: 3.63 MIL/uL — ABNORMAL LOW (ref 3.80–5.20)
RDW: 19.1 % — AB (ref 11.5–14.5)
WBC: 6.7 10*3/uL (ref 3.6–11.0)

## 2016-08-24 LAB — BASIC METABOLIC PANEL WITH GFR
Anion gap: 6 (ref 5–15)
BUN: 10 mg/dL (ref 6–20)
CO2: 26 mmol/L (ref 22–32)
Calcium: 8.3 mg/dL — ABNORMAL LOW (ref 8.9–10.3)
Chloride: 106 mmol/L (ref 101–111)
Creatinine, Ser: 0.54 mg/dL (ref 0.44–1.00)
GFR calc Af Amer: >60 mL/min
GFR calc non Af Amer: >60 mL/min
Glucose, Bld: 98 mg/dL (ref 65–99)
Potassium: 3.1 mmol/L — ABNORMAL LOW (ref 3.5–5.1)
Sodium: 138 mmol/L (ref 135–145)

## 2016-08-24 LAB — SURGICAL PATHOLOGY

## 2016-08-24 LAB — MAGNESIUM: Magnesium: 1.9 mg/dL (ref 1.7–2.4)

## 2016-08-24 SURGERY — COLONOSCOPY WITH PROPOFOL
Anesthesia: General

## 2016-08-24 MED ORDER — POTASSIUM CHLORIDE CRYS ER 20 MEQ PO TBCR
20.0000 meq | EXTENDED_RELEASE_TABLET | Freq: Every day | ORAL | Status: DC
  Administered 2016-08-25: 20 meq via ORAL
  Filled 2016-08-24: qty 1

## 2016-08-24 MED ORDER — PEG 3350-KCL-NA BICARB-NACL 420 G PO SOLR
2000.0000 mL | Freq: Once | ORAL | Status: AC
Start: 2016-08-24 — End: 2016-08-24
  Administered 2016-08-24: 2000 mL via ORAL
  Filled 2016-08-24: qty 4000

## 2016-08-24 MED ORDER — POTASSIUM CHLORIDE CRYS ER 20 MEQ PO TBCR
40.0000 meq | EXTENDED_RELEASE_TABLET | Freq: Once | ORAL | Status: AC
  Administered 2016-08-24: 40 meq via ORAL
  Filled 2016-08-24: qty 2

## 2016-08-24 MED ORDER — PANTOPRAZOLE SODIUM 40 MG PO TBEC
40.0000 mg | DELAYED_RELEASE_TABLET | Freq: Two times a day (BID) | ORAL | Status: DC
  Administered 2016-08-24: 40 mg via ORAL
  Filled 2016-08-24: qty 1

## 2016-08-24 NOTE — H&P (Deleted)
[name] MD [address] [place], [place] [address] Phone: [phone] Fax : [phone]  Primary Care Physician:  [name], MD Primary Gastroenterologist:  Dr. [name]   Pre-Procedure History & Physical: HPI:  [name] is a 83 y.o. female is here for an endoscopy, colonoscopy and possible dilation .   Past Medical History:  Diagnosis Date  . Arthritis    back  . Benign neoplasm of skin, site unspecified   . Bowel trouble   . Cancer ([place])    colon 20 years ago  . Cancer of breast [facility]) April 10,.2013   Left breast, 3.8 cm, 4 cm axillary node, T2, N1a, ER negative PR negative, HER-2/neu not amplified.  . Hard of hearing   . Heartburn   . Hiatal hernia   . History of stomach ulcers   . Hypertension 2012  . Personal history of malignant neoplasm of breast 2013   LEFT MASTECTOMY,left modified radical mastectomy on September 06, 2011 483.8 cm primary tumor with a 4.6 cm axillary metastasis. 3 of 13 nodes were positive. T2,N1a lesion. This was a triple negative lesion  . Shingles Dec 2015  . Vaginal atrophy 08/10/2015    Past Surgical History:  Procedure Laterality Date  . ABDOMINAL HYSTERECTOMY     42 YEARS AGO  . BREAST SURGERY Left 09-06-11   left modified radical mastectomy on September 06, 2011 483.8 cm primary tumor with a 4.6 cm axillary metastasis. 3 of 13 nodes were positive. T2,N1a lesion. This was a triple negative lesion  . CARPAL TUNNEL RELEASE Right September 25, 2014   Skip Estimable, M.D.  . COLON SURGERY  20 YEARS AGO  . COLONOSCOPY  2012   DR. ELLIOTT  . ESOPHAGOGASTRODUODENOSCOPY (EGD) WITH PROPOFOL N/A 08/23/2016   Procedure: ESOPHAGOGASTRODUODENOSCOPY (EGD) WITH PROPOFOL;  Surgeon: [name], MD;  Location: [facility] ENDOSCOPY;  Service: Endoscopy;  Laterality: N/A;  . MASTECTOMY Left 2013   left modified radical mastectomy on September 06, 2011 Stage 2; 3.8 cm primary tumor with a 4.6 cm axillary metastasis. 3 of 13 nodes were positive. T2,N1a  lesion. This was a triple negative lesion  . UPPER GI ENDOSCOPY  2012   BLEEDING    Prior to Admission medications   Medication Sig Start Date End Date Taking? Authorizing Provider  colestipol (COLESTID) 1 g tablet Take 1 tablet by mouth 2 (two) times daily. 05/15/16  Yes Historical Provider, MD  Cranberry 500 MG CAPS Take by mouth daily.   Yes Historical Provider, MD  gabapentin (NEURONTIN) 100 MG capsule Take on tablet each night for two weeks. Increase to two tablets nightly if no improvement. 09/09/15  Yes Robert Bellow, MD  metoprolol succinate (TOPROL-XL) 25 MG 24 hr tablet Take 25 mg by mouth daily.   Yes Historical Provider, MD  naproxen sodium (ANAPROX) 220 MG tablet Take 220 mg by mouth 2 (two) times daily with a meal.   Yes Historical Provider, MD  Potassium Gluconate 595 MG CAPS Take 1 capsule by mouth daily.    Yes Historical Provider, MD  Probiotic Product (ALIGN) 4 MG CAPS Take 1 capsule by mouth daily.    Yes Historical Provider, MD  ranitidine (ZANTAC) 150 MG tablet Take by mouth. 02/14/16 02/13/17 Yes Historical Provider, MD  temazepam (RESTORIL) 15 MG capsule Take 15 mg by mouth at bedtime.   Yes Historical Provider, MD  vitamin B-12 (CYANOCOBALAMIN) 1000 MCG tablet Take 1,000 mcg by mouth daily.   Yes Historical Provider, MD  estradiol (ESTRACE VAGINAL) 0.1 MG/GM vaginal cream Apply 0.6m (pea-sized amount)  just inside the vaginal introitus with a finger-tip every night for two weeks and then Monday, Wednesday and Friday nights. 07/19/15   SNori Riis PA-C  loperamide (IMODIUM A-D) 2 MG tablet Take 2 mg by mouth as needed for diarrhea or loose stools.    Historical Provider, MD  OXYQUINOLONE SULFATE VAGINAL 0.025 % GEL Place 1 Applicatorful vaginally 2 (two) times a week. 09/09/15   ARubie Maid MD  triamcinolone (NASACORT ALLERGY 24HR) 55 MCG/ACT AERO nasal inhaler Place 2 sprays into the nose as needed.     Historical Provider, MD    Allergies as of 08/21/2016 -  Review Complete 08/21/2016  Allergen Reaction Noted  . Codeine Nausea And Vomiting 07/27/2012    Family History  Problem Relation Age of Onset  . Lung cancer Brother     colono ca  . Skin cancer Daughter   . Leukemia Father   . Colon cancer Mother   . Skin cancer Son   . Kidney disease Neg Hx   . Bladder Cancer Neg Hx     Social History   Social History  . Marital status: Widowed    Spouse name: N/A  . Number of children: N/A  . Years of education: N/A   Occupational History  . Not on file.   Social History Main Topics  . Smoking status: Never Smoker  . Smokeless tobacco: Never Used  . Alcohol use No  . Drug use: No  . Sexual activity: No   Other Topics Concern  . Not on file   Social History Narrative  . No narrative on file    Review of Systems: See HPI, otherwise negative ROS  Physical Exam: BP (!) 122/57 (BP Location: Right Arm)   Pulse 83   Temp 97.7 F (36.5 C)   Resp 16   Ht 5' 6" (1.676 m)   Wt 152 lb 3.2 oz (69 kg)   SpO2 96%   BMI 24.57 kg/m  General:   Alert,  pleasant and cooperative in NAD Head:  Normocephalic and atraumatic. Neck:  Supple; no masses or thyromegaly. Lungs:  Clear throughout to auscultation.    Heart:  Regular rate and rhythm. Abdomen:  Soft, nontender and nondistended. Normal bowel sounds, without guarding, and without rebound.   Neurologic:  Alert and  oriented x4;  grossly normal neurologically.  Impression/Plan: LLIESE DIZDAREVICis here for an endoscopy, colonoscopy and possible dilation  to be performed for iron deficiency anemia and dysphagia   Risks, benefits, limitations, and alternatives regarding  endoscopy, colonoscopy and possible dilation  have been reviewed with the patient.  Questions have been answered.  All parties agreeable.   [name] MD  08/24/2016, 1:08 PM

## 2016-08-24 NOTE — Consult Note (Signed)
Noted run of Vtach. Discussed with Anesthesia who would like cardiac clearance prior to procedure. Noted low potassium .   Plan  1. Cardiac evaluation  2. Correct electrolytes 3. Plan for colonoscopy tomorrow 4. If cardiology says ok to proceed for tomorrow please give half a gallon golytely tonight for procedure tomorrow  D/c Dr Bridgett Larsson  Dr [name]  Gastroenterology/Hepatology Pager: [phone]

## 2016-08-24 NOTE — Consult Note (Signed)
[facility] Cardiology  CARDIOLOGY CONSULT NOTE  Patient ID: [name] MRN: [id number] DOB/AGE: 81/12/1920 81 y.o.  Admit date: 08/21/2016 Referring Physician Vicente Males Primary Physician [facility] Primary Cardiologist None on file Reason for Consultation Non-sustained wide complex tachycardia, pre-operative clearance  HPI: 83 year old female referred for preoperative clearance with recent non-sustained wide-complex tachycardia. Patient has a history of hypertension, anemia, breast cancer and colon cancer, and was recently admitted for GI bleed with hemoglobin and hematocrit 6.8 and 22, respectively. Patient was scheduled to have colonoscopy, however several runs of nonsustained wide complex tachycardia were noted, and colonoscopy was postponed. Potassium low at 3.1. Patient denies chest pain with the exception of right-sided chest discomfort where she has shingles, shortness of breath, lightheadedness, or palpitations.   Review of systems complete and found to be negative unless listed above     Past Medical History:  Diagnosis Date  . Arthritis    back  . Benign neoplasm of skin, site unspecified   . Bowel trouble   . Cancer ([place])    colon 20 years ago  . Cancer of breast [facility]) April 10,.2013   Left breast, 3.8 cm, 4 cm axillary node, T2, N1a, ER negative PR negative, HER-2/neu not amplified.  . Hard of hearing   . Heartburn   . Hiatal hernia   . History of stomach ulcers   . Hypertension 2012  . Personal history of malignant neoplasm of breast 2013   LEFT MASTECTOMY,left modified radical mastectomy on September 06, 2011 483.8 cm primary tumor with a 4.6 cm axillary metastasis. 3 of 13 nodes were positive. T2,N1a lesion. This was a triple negative lesion  . Shingles Dec 2015  . Vaginal atrophy 08/10/2015    Past Surgical History:  Procedure Laterality Date  . ABDOMINAL HYSTERECTOMY     42 YEARS AGO  . BREAST SURGERY Left 09-06-11   left modified radical mastectomy on September 06, 2011 483.8  cm primary tumor with a 4.6 cm axillary metastasis. 3 of 13 nodes were positive. T2,N1a lesion. This was a triple negative lesion  . CARPAL TUNNEL RELEASE Right September 25, 2014   Skip Estimable, M.D.  . COLON SURGERY  20 YEARS AGO  . COLONOSCOPY  2012   DR. ELLIOTT  . ESOPHAGOGASTRODUODENOSCOPY (EGD) WITH PROPOFOL N/A 08/23/2016   Procedure: ESOPHAGOGASTRODUODENOSCOPY (EGD) WITH PROPOFOL;  Surgeon: [name], MD;  Location: [facility] ENDOSCOPY;  Service: Endoscopy;  Laterality: N/A;  . MASTECTOMY Left 2013   left modified radical mastectomy on September 06, 2011 Stage 2; 3.8 cm primary tumor with a 4.6 cm axillary metastasis. 3 of 13 nodes were positive. T2,N1a lesion. This was a triple negative lesion  . UPPER GI ENDOSCOPY  2012   BLEEDING    Prescriptions Prior to Admission  Medication Sig Dispense Refill Last Dose  . colestipol (COLESTID) 1 g tablet Take 1 tablet by mouth 2 (two) times daily.   08/20/2016 at Unknown time  . Cranberry 500 MG CAPS Take by mouth daily.   08/21/2016 at Unknown time  . gabapentin (NEURONTIN) 100 MG capsule Take on tablet each night for two weeks. Increase to two tablets nightly if no improvement. 90 capsule 6 08/21/2016 at Unknown time  . metoprolol succinate (TOPROL-XL) 25 MG 24 hr tablet Take 25 mg by mouth daily.   08/21/2016 at Unknown time  . naproxen sodium (ANAPROX) 220 MG tablet Take 220 mg by mouth 2 (two) times daily with a meal.   08/21/2016 at prn  . Potassium Gluconate 595 MG  CAPS Take 1 capsule by mouth daily.    08/21/2016 at Unknown time  . Probiotic Product (ALIGN) 4 MG CAPS Take 1 capsule by mouth daily.    08/21/2016 at prn  . ranitidine (ZANTAC) 150 MG tablet Take by mouth.   08/21/2016 at Unknown time  . temazepam (RESTORIL) 15 MG capsule Take 15 mg by mouth at bedtime.   08/20/2016 at Unknown time  . vitamin B-12 (CYANOCOBALAMIN) 1000 MCG tablet Take 1,000 mcg by mouth daily.   08/21/2016 at Unknown time  . estradiol (ESTRACE VAGINAL) 0.1 MG/GM vaginal cream  Apply 0.52m (pea-sized amount)  just inside the vaginal introitus with a finger-tip every night for two weeks and then Monday, Wednesday and Friday nights. 30 g 12 prn at prn  . loperamide (IMODIUM A-D) 2 MG tablet Take 2 mg by mouth as needed for diarrhea or loose stools.   prn at prn  . OXYQUINOLONE SULFATE VAGINAL 0.025 % GEL Place 1 Applicatorful vaginally 2 (two) times a week. 1 Tube 3 Taking  . triamcinolone (NASACORT ALLERGY 24HR) 55 MCG/ACT AERO nasal inhaler Place 2 sprays into the nose as needed.    prn at prn   Social History   Social History  . Marital status: Widowed    Spouse name: N/A  . Number of children: N/A  . Years of education: N/A   Occupational History  . Not on file.   Social History Main Topics  . Smoking status: Never Smoker  . Smokeless tobacco: Never Used  . Alcohol use No  . Drug use: No  . Sexual activity: No   Other Topics Concern  . Not on file   Social History Narrative  . No narrative on file    Family History  Problem Relation Age of Onset  . Lung cancer Brother     colono ca  . Skin cancer Daughter   . Leukemia Father   . Colon cancer Mother   . Skin cancer Son   . Kidney disease Neg Hx   . Bladder Cancer Neg Hx       Review of systems complete and found to be negative unless listed above      PHYSICAL EXAM  General:  in no acute distress HEENT:  Normocephalic and atramatic Neck:  No JVD.  Lungs: Clear bilaterally to auscultation and percussion. Heart: HRRR . Normal S1 and S2 without gallops or murmurs.  Msk:  Patient sitting upright in chair without difficulty Extremities: No clubbing, cyanosis or edema.   Neuro: Alert and oriented X 3. Skin: Herpetic lesions under right breast, wrapping around to back, not crossing midline Psych:  Good affect, responds appropriately  Labs:   Lab Results  Component Value Date   WBC 6.7 08/24/2016   HGB 8.5 (L) 08/24/2016   HCT 26.8 (L) 08/24/2016   MCV 73.8 (L) 08/24/2016   PLT  278 08/24/2016    Recent Labs Lab 08/24/16 0635  NA 138  K 3.1*  CL 106  CO2 26  BUN 10  CREATININE 0.54  CALCIUM 8.3*  GLUCOSE 98   Lab Results  Component Value Date   TROPONINI <0.03 08/22/2016   No results found for: CHOL No results found for: HDL No results found for: LDLCALC No results found for: TRIG No results found for: CHOLHDL No results found for: LDLDIRECT    Radiology: Dg Chest 2 View  Result Date: 08/21/2016 CLINICAL DATA:  Chest pain and shortness of breath for several days EXAM: CHEST  2 VIEW COMPARISON:  08/07/2011 FINDINGS: Cardiac shadow is mildly enlarged. A large hiatal hernia is noted slightly increased from the prior study. The lungs are well aerated bilaterally. No focal infiltrate or sizable effusion is seen. Mild chronic interstitial changes are noted. No acute bony abnormality is seen. IMPRESSION: Large hiatal hernia. No acute abnormality noted. Electronically Signed   By: Inez Catalina M.D.   On: 08/21/2016 16:29    EKG: Sinus rhythm with frequent PVCs  ASSESSMENT AND PLAN:  1. Non-sustained wide complex tachycardia of uncertain clinical significance, likely exacerbated by acute GI bleed and anemia. 2. Hypokalemic with a history of hypokalemia, taking home supplements prior to admission 3. GI bleed with subsequent symptomatic anemia   Recommendations: 1. Agree with current therapy 2. Proceed with colonoscopy as GI bleed and anemia likely exacerbating non-sustained wide complex tachycardia. 3. Continue to supplement with potassium chloride 20 mEq daily 4. Repeat BMP in morning 5. 2D echocardiogram 6. Continue metoprolol pre-, peri-, and post-operatively   Signed: Lora Havens 08/24/2016, 4:04 PM

## 2016-08-24 NOTE — Progress Notes (Addendum)
[facility] [place] at [facility]                                                                                                                                                                                  Patient Demographics   [name], is a 83 y.o. female, DOB - 1921/03/13, KXF:[id number]  Admit date - 08/21/2016   Admitting Physician Epifanio Lesches, MD  Outpatient Primary MD for the patient is [name], MD   LOS - 3  Subjective: Pt had a little bloody stool yesterday. No active bleeding today.   Review of Systems:   CONSTITUTIONAL: No documented fever. No fatigue, weakness. No weight gain, no weight loss.  EYES: No blurry or double vision.  ENT: No tinnitus. No postnasal drip. No redness of the oropharynx.  RESPIRATORY: No cough, no wheeze, no hemoptysis. No dyspnea.  CARDIOVASCULAR:  +chest pain. No orthopnea. No palpitations. No syncope.  GASTROINTESTINAL: + nausea, no vomiting or diarrhea. No abdominal pain. No melena or hematochezia.  GENITOURINARY: No dysuria or hematuria.  ENDOCRINE: No polyuria or nocturia. No heat or cold intolerance.  HEMATOLOGY: No anemia. No bruising. No bleeding.  INTEGUMENTARY: No rashes. No lesions.  MUSCULOSKELETAL: No arthritis. No swelling. No gout.  NEUROLOGIC: No numbness, tingling, or ataxia. No seizure-type activity.  PSYCHIATRIC: No anxiety. No insomnia. No ADD.    Vitals:   Vitals:   08/23/16 1227 08/23/16 1946 08/24/16 0529 08/24/16 0738  BP: (!) 139/54 (!) 134/54 (!) 141/63 (!) 122/57  Pulse:  76 78 83  Resp:  16 (!) 8 16  Temp: 99.5 F (37.5 C) 97.8 F (36.6 C) 97.7 F (36.5 C)   TempSrc: Tympanic Oral    SpO2:  99% 95% 96%  Weight:      Height:        Wt Readings from Last 3 Encounters:  08/21/16 152 lb 3.2 oz (69 kg)  06/08/16 150 lb 6.4 oz (68.2 kg)  05/01/16 156 lb (70.8 kg)     Intake/Output Summary (Last 24 hours) at 08/24/16 1343 Last data filed at 08/24/16 6967  Gross  per 24 hour  Intake           304.33 ml  Output              400 ml  Net           -95.67 ml    Physical Exam:   GENERAL: Pleasant-appearing in no apparent distress.  HEAD, EYES, EARS, NOSE AND THROAT: Atraumatic, normocephalic. Extraocular muscles are intact. Pupils equal and reactive to light. Sclerae anicteric. No conjunctival injection. No oro-pharyngeal erythema.  NECK: Supple. There is no jugular venous  distention. No bruits, no lymphadenopathy, no thyromegaly.  HEART: Regular rate and rhythm,. No murmurs, no rubs, no clicks.  LUNGS: Clear to auscultation bilaterally. No rales or rhonchi. No wheezes.  ABDOMEN: Soft, flat, nontender, nondistended. Has good bowel sounds. No hepatosplenomegaly appreciated.  EXTREMITIES: No evidence of any cyanosis, clubbing, or peripheral edema.  +2 pedal and radial pulses bilaterally.  NEUROLOGIC: The patient is alert, awake, and oriented x3 with no focal motor or sensory deficits appreciated bilaterally.  SKIN: Moist and warm with no rashes appreciated.  Psych: Not anxious, depressed LN: No inguinal LN enlargement    Antibiotics   Anti-infectives    Start     Dose/Rate Route Frequency Ordered Stop   08/21/16 2200  valACYclovir (VALTREX) tablet 1,000 mg     1,000 mg Oral Every 12 hours 08/21/16 1824     08/21/16 1815  valACYclovir (VALTREX) tablet 1,000 mg  Status:  Discontinued     1,000 mg Oral 3 times daily 08/21/16 1811 08/21/16 2359      Medications   Scheduled Meds: . acidophilus  1 capsule Oral Daily  . colestipol  1 g Oral BID  . gabapentin  200 mg Oral TID  . metoprolol succinate  25 mg Oral Daily  . pantoprazole (PROTONIX) IV  40 mg Intravenous Q12H  . temazepam  15 mg Oral QHS  . valACYclovir  1,000 mg Oral Q12H  . vitamin B-12  1,000 mcg Oral Daily   Continuous Infusions: . sodium chloride    . sodium chloride 20 mL/hr at 08/23/16 1347   PRN Meds:.estradiol, hydrALAZINE, morphine injection, nitroGLYCERIN, ondansetron  (ZOFRAN) IV, promethazine   Data Review:   Micro Results No results found for this or any previous visit (from the past 240 hour(s)).  Radiology Reports Dg Chest 2 View  Result Date: 08/21/2016 CLINICAL DATA:  Chest pain and shortness of breath for several days EXAM: CHEST  2 VIEW COMPARISON:  08/07/2011 FINDINGS: Cardiac shadow is mildly enlarged. A large hiatal hernia is noted slightly increased from the prior study. The lungs are well aerated bilaterally. No focal infiltrate or sizable effusion is seen. Mild chronic interstitial changes are noted. No acute bony abnormality is seen. IMPRESSION: Large hiatal hernia. No acute abnormality noted. Electronically Signed   By: Inez Catalina M.D.   On: 08/21/2016 16:29     CBC  Recent Labs Lab 08/22/16 1045 08/22/16 1316 08/22/16 1753 08/23/16 0956 08/24/16 0635  WBC 10.4 9.8 8.3 8.4 6.7  HGB 9.2* 10.4* 9.1* 9.3* 8.5*  HCT 28.7* 32.2* 28.0* 28.6* 26.8*  PLT 329 345 323 290 278  MCV 74.2* 75.4* 73.6* 73.8* 73.8*  MCH 23.8* 24.3* 24.0* 23.9* 23.6*  MCHC 32.0 32.2 32.6 32.4 31.9*  RDW 18.1* 18.3* 18.5* 18.9* 19.1*    Chemistries   Recent Labs Lab 08/21/16 1530 08/24/16 0635  NA 139 138  K 4.0 3.1*  CL 109 106  CO2 23 26  GLUCOSE 113* 98  BUN 16 10  CREATININE 0.71 0.54  CALCIUM 8.5* 8.3*  MG  --  1.9   ------------------------------------------------------------------------------------------------------------------ estimated creatinine clearance is 41.1 mL/min (by C-G formula based on SCr of 0.54 mg/dL). ------------------------------------------------------------------------------------------------------------------ No results for input(s): HGBA1C in the last 72 hours. ------------------------------------------------------------------------------------------------------------------ No results for input(s): CHOL, HDL, LDLCALC, TRIG, CHOLHDL, LDLDIRECT in the last 72  hours. ------------------------------------------------------------------------------------------------------------------ No results for input(s): TSH, T4TOTAL, T3FREE, THYROIDAB in the last 72 hours.  Invalid input(s): FREET3 ------------------------------------------------------------------------------------------------------------------  Recent Labs  08/22/16 1045  FERRITIN 4*  TIBC 446  IRON 44    Coagulation profile No results for input(s): INR, PROTIME in the last 168 hours.  No results for input(s): DDIMER in the last 72 hours.  Cardiac Enzymes  Recent Labs Lab 08/21/16 1530 08/22/16 1045  TROPONINI <0.03 <0.03   ------------------------------------------------------------------------------------------------------------------ Invalid input(s): POCBNP    Assessment & Plan   83 year old female patient with chest pain, shortness of breath secondary to GI bleed with black stool, symptomatic anemia. Assessment and plan   # GI bleed Suspect chronic in nature and felt to be due to lower accordingly GI due to the microcytic nature of it. She took a lot NSAIDS.  EGD- normal except for mild esophagitis and large hiatal hernia. Patient doesn't want a colonoscopy however her son has convinced her to get a colonoscopy.  Unable to get colonoscopy today due to unsustained V-tach this am. Waiting for cardiology clearance for tomorrow. Discussed with Dr. Vicente Males and Dr. Saralyn Pilar.  * Symptomatic anemia due to acute blood loss.  Hb was 6.8. s/p  transfusion of 2 units of packed RBC. Hb up to 10.4 but down to 8.5 today.  #Shingles on under right breast: Continue airborne and contact precautions, continue on Valtrex 1 g by mouth 3 times a day. Started on lyrica  # history of colon cancer, breast cancer: .  #essential hypertension continue metoprolol     Code Status Orders        Start     Ordered   08/21/16 1818  Full code  Continuous     08/21/16 1824    Code Status  History    Date Active Date Inactive Code Status Order ID Comments User Context   This patient has a current code status but no historical code status.    Advance Directive Documentation     Most Recent Value  Type of Advance Directive  Healthcare Power of Attorney  Pre-existing out of facility DNR order (yellow form or pink MOST form)  -  "MOST" Form in Place?  -           Consults risk  DVT Prophylaxis  scd's  Lab Results  Component Value Date   PLT 278 08/24/2016      Time Spent in minutes  37 min  Greater than 50% of time spent in care coordination and counseling patient regarding the condition and plan of care. Discussed with the patient and his sons.   Demetrios Loll M.D on 08/24/2016 at 1:43 PM  Between 7am to 6pm - Pager - [phone]  After 6pm go to [email or web address] - password [email or web address] [facility]   Office  [phone]

## 2016-08-24 NOTE — Plan of Care (Signed)
Problem: Bowel/Gastric: Goal: Will not experience complications related to bowel motility Outcome: Progressing Bowel prep tolerated with complaints.  Light yellow liquid stool noted. (yellow jello consumed earlier per family)

## 2016-08-24 NOTE — Progress Notes (Signed)
Patient had 11 beat run VTach. MD notified, Will continue to monitor.

## 2016-08-24 NOTE — Plan of Care (Signed)
Problem: Bowel/Gastric: Goal: Will not experience complications related to bowel motility Outcome: Progressing Colonoscopy bowel prep tolerated without difficulty. Liquid pink tinged stool at this time.  (Pt consumed red jello earlier).

## 2016-08-24 NOTE — Care Management (Signed)
Physical therapy consult is pending.  Patient was to have a colonoscopy today but had a run of [place].  GI requests cardiology clearance before proceeding.  PT consult is pending.

## 2016-08-24 NOTE — Plan of Care (Signed)
Problem: Safety: Goal: Ability to remain free from injury will improve Outcome: Progressing Patient calls for assistance to [facility]. Bed alarm on. Call light within reach. Hourly rounding completed on patient.

## 2016-08-24 NOTE — Anesthesia Preprocedure Evaluation (Deleted)
Anesthesia Evaluation  Patient identified by MRN, date of birth, ID band Patient awake    Reviewed: Allergy & Precautions, H&P , NPO status , Patient's Chart, lab work & pertinent test results, reviewed documented beta blocker date and time   Airway Mallampati: II   Neck ROM: full    Dental  (+) Poor Dentition   Pulmonary neg pulmonary ROS,    Pulmonary exam normal        Cardiovascular hypertension, negative cardio ROS Normal cardiovascular exam Rhythm:regular Rate:Normal     Neuro/Psych  Neuromuscular disease negative neurological ROS  negative psych ROS   GI/Hepatic negative GI ROS, Neg liver ROS, hiatal hernia, GERD  ,  Endo/Other  negative endocrine ROS  Renal/GU negative Renal ROS  negative genitourinary   Musculoskeletal   Abdominal   Peds  Hematology negative hematology ROS (+) anemia ,   Anesthesia Other Findings Past Medical History: No date: Arthritis     Comment: back No date: Benign neoplasm of skin, site unspecified No date: Bowel trouble No date: Cancer [facility])     Comment: colon 20 years ago April 10,.2013: Cancer of breast [facility])     Comment: Left breast, 3.8 cm, 4 cm axillary node, T2,               N1a, ER negative PR negative, HER-2/neu not               amplified. No date: Hard of hearing No date: Heartburn No date: Hiatal hernia No date: History of stomach ulcers 2012: Hypertension 2013: Personal history of malignant neoplasm of brea*     Comment: LEFT MASTECTOMY,left modified radical               mastectomy on September 06, 2011 483.8 cm primary               tumor with a 4.6 cm axillary metastasis. 3 of               13 nodes were positive. T2,N1a lesion. This was              a triple negative lesion Dec 2015: Shingles 08/10/2015: Vaginal atrophy Past Surgical History: No date: ABDOMINAL HYSTERECTOMY     Comment: 42 YEARS AGO 09-06-11: BREAST SURGERY Left     Comment: left modified  radical mastectomy on September 06, 2011 483.8 cm primary tumor with a 4.6 cm               axillary metastasis. 3 of 13 nodes were               positive. T2,N1a lesion. This was a triple               negative lesion September 25, 2014: CARPAL TUNNEL RELEASE Right     Comment: Francesco Sor, M.D. 20 YEARS AGO: COLON SURGERY 2012: COLONOSCOPY     Comment: DR. Mechele Collin 08/23/2016: ESOPHAGOGASTRODUODENOSCOPY (EGD) WITH PROPOFOL N/A     Comment: Procedure: ESOPHAGOGASTRODUODENOSCOPY (EGD)               WITH PROPOFOL;  Surgeon: Wyline Mood, MD;                Location: [facility] ENDOSCOPY;  Service: Endoscopy;               Laterality: N/A; 2013: MASTECTOMY Left     Comment: left modified radical mastectomy on  September 06, 2011 Stage 2; 3.8 cm primary tumor with a 4.6               cm axillary metastasis. 3 of 13 nodes were               positive. T2,N1a lesion. This was a triple               negative lesion 2012: UPPER GI ENDOSCOPY     Comment: BLEEDING BMI    Body Mass Index:  24.57 kg/m     Reproductive/Obstetrics negative OB ROS                             Anesthesia Physical Anesthesia Plan  ASA: III  Anesthesia Plan: General   Post-op Pain Management:    Induction:   Airway Management Planned:   Additional Equipment:   Intra-op Plan:   Post-operative Plan:   Informed Consent: I have reviewed the patients History and Physical, chart, labs and discussed the procedure including the risks, benefits and alternatives for the proposed anesthesia with the patient or authorized representative who has indicated his/her understanding and acceptance.   Dental Advisory Given  Plan Discussed with: CRNA  Anesthesia Plan Comments:         Anesthesia Quick Evaluation

## 2016-08-25 ENCOUNTER — Inpatient Hospital Stay: Payer: Medicare Other | Admitting: Anesthesiology

## 2016-08-25 ENCOUNTER — Encounter: Payer: Self-pay | Admitting: Anesthesiology

## 2016-08-25 ENCOUNTER — Encounter
Admission: EM | Disposition: A | Discharge status: Home Health | Payer: Self-pay | Source: Home / Self Care | Attending: Internal Medicine

## 2016-08-25 DIAGNOSIS — D123 Benign neoplasm of transverse colon: Secondary | ICD-10-CM

## 2016-08-25 DIAGNOSIS — K5730 Diverticulosis of large intestine without perforation or abscess without bleeding: Secondary | ICD-10-CM

## 2016-08-25 HISTORY — PX: COLONOSCOPY WITH PROPOFOL: SHX5780

## 2016-08-25 LAB — URINALYSIS, COMPLETE (UACMP) WITH MICROSCOPIC
BACTERIA UA: NONE SEEN
BILIRUBIN URINE: NEGATIVE
GLUCOSE, UA: 50 mg/dL — AB
HGB URINE DIPSTICK: NEGATIVE
Ketones, ur: NEGATIVE mg/dL
NITRITE: NEGATIVE
PH: 6 (ref 5.0–8.0)
Protein, ur: NEGATIVE mg/dL
SPECIFIC GRAVITY, URINE: 1.015 (ref 1.005–1.030)

## 2016-08-25 LAB — ECHOCARDIOGRAM COMPLETE
Height: 66 in
Weight: 2435.2 oz

## 2016-08-25 LAB — BASIC METABOLIC PANEL
Anion gap: 8 (ref 5–15)
BUN: 7 mg/dL (ref 6–20)
CO2: 25 mmol/L (ref 22–32)
Calcium: 8.1 mg/dL — ABNORMAL LOW (ref 8.9–10.3)
Chloride: 103 mmol/L (ref 101–111)
Creatinine, Ser: 0.58 mg/dL (ref 0.44–1.00)
Glucose, Bld: 125 mg/dL — ABNORMAL HIGH (ref 65–99)
Potassium: 3.2 mmol/L — ABNORMAL LOW (ref 3.5–5.1)
SODIUM: 136 mmol/L (ref 135–145)

## 2016-08-25 LAB — CBC
HEMATOCRIT: 27.2 % — AB (ref 35.0–47.0)
Hemoglobin: 8.8 g/dL — ABNORMAL LOW (ref 12.0–16.0)
MCH: 23.6 pg — ABNORMAL LOW (ref 26.0–34.0)
MCHC: 32.3 g/dL (ref 32.0–36.0)
MCV: 72.9 fL — ABNORMAL LOW (ref 80.0–100.0)
Platelets: 260 10*3/uL (ref 150–440)
RBC: 3.73 MIL/uL — ABNORMAL LOW (ref 3.80–5.20)
RDW: 20.2 % — AB (ref 11.5–14.5)
WBC: 11.4 10*3/uL — ABNORMAL HIGH (ref 3.6–11.0)

## 2016-08-25 SURGERY — COLONOSCOPY WITH PROPOFOL
Anesthesia: General

## 2016-08-25 MED ORDER — CEPHALEXIN 500 MG PO CAPS
500.0000 mg | ORAL_CAPSULE | Freq: Two times a day (BID) | ORAL | Status: DC
  Filled 2016-08-25: qty 1

## 2016-08-25 MED ORDER — DEXTROSE 5 % IV SOLN
1.0000 g | INTRAVENOUS | Status: DC
  Filled 2016-08-25: qty 10

## 2016-08-25 MED ORDER — PROPOFOL 500 MG/50ML IV EMUL
INTRAVENOUS | Status: DC | PRN
  Administered 2016-08-25: 75 ug/kg/min via INTRAVENOUS

## 2016-08-25 MED ORDER — VALACYCLOVIR HCL 1 G PO TABS: 1000.0000 mg | ORAL_TABLET | Freq: Two times a day (BID) | ORAL | 0 refills | Status: DC

## 2016-08-25 MED ORDER — PANTOPRAZOLE SODIUM 40 MG PO TBEC: 40.0000 mg | DELAYED_RELEASE_TABLET | Freq: Two times a day (BID) | ORAL | 0 refills | Status: DC

## 2016-08-25 MED ORDER — POTASSIUM CHLORIDE CRYS ER 20 MEQ PO TBCR
40.0000 meq | EXTENDED_RELEASE_TABLET | Freq: Once | ORAL | Status: AC
  Administered 2016-08-25: 40 meq via ORAL
  Filled 2016-08-25: qty 2

## 2016-08-25 MED ORDER — PROPOFOL 500 MG/50ML IV EMUL
INTRAVENOUS | Status: AC
  Filled 2016-08-25: qty 50

## 2016-08-25 MED ORDER — CEPHALEXIN 500 MG PO CAPS: 500.0000 mg | ORAL_CAPSULE | Freq: Two times a day (BID) | ORAL | 0 refills | Status: DC

## 2016-08-25 NOTE — Care Management (Signed)
patient and her family prefer that patient discharge home and agreeable to home health.  Patient will have family members with her and has access to a walker and bedside commode.  Current with her pcp and no issues accessing medical care.  Confirmed contact information. Referral for home health accepted by [facility] for SN and PT

## 2016-08-25 NOTE — OR Nursing (Signed)
Abrasion on rt arm . Dr Bailey Mech stated to dress . Betadine applied with clear dressing.Family aware. RT. Arm iv almost out on arrival and retaped.

## 2016-08-25 NOTE — Progress Notes (Signed)
Notified by CCMD of 6 beat VT on telemetry.  Pt asleep on assessment.  Skin warm, dry.  Respers even, unlabored.  Will continue to monitor.

## 2016-08-25 NOTE — Progress Notes (Signed)
Frequent  PVCs continue on telemetry.  Pt denies palpitations.  No distress noted at this time. No further liquid stools noted after 11pm.

## 2016-08-25 NOTE — Evaluation (Signed)
Physical Therapy Evaluation Patient Details Name: [name] MRN: [id number] DOB: 26-Oct-1920 Today's Date: 08/25/2016   History of Present Illness  Pt is a 83 y/o F who was admitted due to iron deficiency anemia.  Colonoscopy completed on 3/30 which, per chart review, was normal excepa a small polyp which was excised.  Pt's PMH includes breast cancer s/p L mastectomy, shingles.    Clinical Impression  Pt admitted with above diagnosis. Pt currently with functional limitations due to the deficits listed below (see PT Problem List). Ms. Mulhall was Ind ambulating with cane PTA.  She currently requires heavy min assist for bed mobility and min assist for sit<>stand transfers and short distance ambulation.  She demonstrates poor safety awareness with transfers and at an increased risk of falling.  Pt's son will be staying with pt until Tuesday, April 3rd but then pt will be at home alone.  Given pt's current mobility status and the fact that the pt will be home alone beginning on Tuesday, recommending SNF at d/c.  Pt will benefit from skilled PT to increase their independence and safety with mobility to allow discharge to the venue listed below.      Follow Up Recommendations SNF    Equipment Recommendations  None recommended by PT (Daughter has RW and BSC that pt can borrow)    Recommendations for Other Services       Precautions / Restrictions Precautions Precautions: Fall;Other (comment) Precaution Comments: Airborne precautions Restrictions Weight Bearing Restrictions: No      Mobility  Bed Mobility Overal bed mobility: Needs Assistance Bed Mobility: Supine to Sit     Supine to sit: HOB elevated;Min assist     General bed mobility comments: Pt requires heavy min assist to elevate trunk and to advance BLEs off bed.  Increased time and effort.  Cues for sequencing.  Transfers Overall transfer level: Needs assistance Equipment used: Straight cane;1 person hand held  assist Transfers: Sit to/from [facility] Sit to Stand: Min assist Stand pivot transfers: Min assist       General transfer comment: Min assist via 1 person HHA to pivot to chair from bed as pt demonstrates flexed posture and is impulsive with poor safety awareness attempting to sit prematurely.  After ambulating with SPC pt again attempts to sit prematurely and requires cues and min assist for safe stand>sit.  Ambulation/Gait Ambulation/Gait assistance: Min assist Ambulation Distance (Feet): 40 Feet Assistive device: Straight cane Gait Pattern/deviations: Step-through pattern;Decreased stride length;Staggering left;Staggering right;Trunk flexed Gait velocity: decreased Gait velocity interpretation: Below normal speed for age/gender General Gait Details: Significantly flexed trunk and L lateral trunk lean while ambulating due to back pain.  Pt reaching out for chair and bed rail for stability while ambulating in room with SPC and requires up to min assist to steady at times due to instability.   Stairs            Wheelchair Mobility    Modified Rankin (Stroke Patients Only)       Balance Overall balance assessment: Needs assistance Sitting-balance support: Feet supported;Single extremity supported Sitting balance-Leahy Scale: Fair Sitting balance - Comments: Pt able to sit EOB without assist but must have at least 1 UE supported    Standing balance support: Single extremity supported;During functional activity Standing balance-Leahy Scale: Poor Standing balance comment: Relies on UE support for static and dynamic activities  Pertinent Vitals/Pain Pain Assessment: Faces Faces Pain Scale: Hurts even more Pain Location: back Pain Descriptors / Indicators: Aching;Constant Pain Intervention(s): Limited activity within patient's tolerance;Monitored during session;Repositioned    Home Living Family/patient expects to  be discharged to:: Private residence Living Arrangements: Alone Available Help at Discharge: Family;Available PRN/intermittently (24/7 from son until Tuesday, 4/3) Type of Home: House Home Access: Stairs to enter Entrance Stairs-Rails: Left Entrance Stairs-Number of Steps: 3 Home Layout: One level Home Equipment: Walker - 2 wheels;Bedside commode (daughter has RW and BSC that pt can borrow)      Prior Function Level of Independence: Needs assistance   Gait / Transfers Assistance Needed: Prior to onset of symptoms pt was ambualting with SPC and denies any falls over the past 6 months.  Pt was a Hydrographic surveyor.  For the past week the daughter reports that the pt has not been OOB much.  ADL's / Homemaking Assistance Needed: Pt was independent with bathing, dressing, cooking, simple cleaning tasks, driving.          Hand Dominance        Extremity/Trunk Assessment   Upper Extremity Assessment Upper Extremity Assessment: Generalized weakness (Strength grossly at least 3/5 BUEs)    Lower Extremity Assessment Lower Extremity Assessment: Generalized weakness (Strength grossly at least 3/5 BLEs)    Cervical / Trunk Assessment Cervical / Trunk Assessment: Kyphotic  Communication   Communication: HOH  Cognition Arousal/Alertness: Awake/alert Behavior During Therapy: WFL for tasks assessed/performed;Impulsive Overall Cognitive Status: Within Functional Limits for tasks assessed                                        General Comments General comments (skin integrity, edema, etc.): Daughter present during Evaluation and Dr. Bridgett Larsson present at end of the session.  This PT had long conversation with the daughter, pt, and Dr. Bridgett Larsson about clinical reasoning behind recommendation for SNF given pt's current mobility status and the fact that she will be alone at home beginning on Tuesday.  Daughter appreciative.    Exercises     Assessment/Plan    PT Assessment Patient  needs continued PT services  PT Problem List Decreased strength;Decreased activity tolerance;Decreased balance;Decreased mobility;Decreased knowledge of use of DME;Decreased safety awareness;Pain       PT Treatment Interventions DME instruction;Gait training;Stair training;Functional mobility training;Therapeutic activities;Therapeutic exercise;Balance training;Neuromuscular re-education;Patient/family education;Modalities    PT Goals (Current goals can be found in the Care Plan section)  Acute Rehab PT Goals Patient Stated Goal: dec back pain PT Goal Formulation: With patient/family Time For Goal Achievement: 09/08/16 Potential to Achieve Goals: Good    Frequency Min 2X/week   Barriers to discharge Inaccessible home environment;Decreased caregiver support Steps to enter home and pt will be alone beginning on Tuesday the 3rd.    Co-evaluation               End of Session Equipment Utilized During Treatment: Gait belt Activity Tolerance: Patient limited by fatigue;Patient limited by pain Patient left: in chair;with call bell/phone within reach;with chair alarm set;with family/visitor present;Other (comment) (Dr. Bridgett Larsson and Lab in room with pt) Nurse Communication: Mobility status PT Visit Diagnosis: Muscle weakness (generalized) (M62.81);Unsteadiness on feet (R26.81)    Time: 6195-0932 PT Time Calculation (min) (ACUTE ONLY): 29 min   Charges:   PT Evaluation $PT Eval Low Complexity: 1 Procedure PT Treatments $Gait Training: 8-22 mins   PT G Codes:  Collie Siad PT, DPT  Collie Siad 08/25/2016, 11:51 AM

## 2016-08-25 NOTE — Discharge Summary (Addendum)
[facility] [place] NAME: [name]    MR#:  [id number]  DATE OF BIRTH:  1920/08/28  DATE OF ADMISSION:  08/21/2016   ADMITTING PHYSICIAN: Epifanio Lesches, MD  DATE OF DISCHARGE: 08/25/2016  PRIMARY CARE PHYSICIAN: [name], MD   ADMISSION DIAGNOSIS:  Shortness of breath [R06.02] Herpes zoster without complication [N82.9] Anemia, unspecified type [D64.9] DISCHARGE DIAGNOSIS:  Active Problems:   GI bleed mild esophagitis and large hiatal hernia. Colon polyps UTI. SECONDARY DIAGNOSIS:   Past Medical History:  Diagnosis Date  . Arthritis    back  . Benign neoplasm of skin, site unspecified   . Bowel trouble   . Cancer ([place])    colon 20 years ago  . Cancer of breast [facility]) April 10,.2013   Left breast, 3.8 cm, 4 cm axillary node, T2, N1a, ER negative PR negative, HER-2/neu not amplified.  . Hard of hearing   . Heartburn   . Hiatal hernia   . History of stomach ulcers   . Hypertension 2012  . Personal history of malignant neoplasm of breast 2013   LEFT MASTECTOMY,left modified radical mastectomy on September 06, 2011 483.8 cm primary tumor with a 4.6 cm axillary metastasis. 3 of 13 nodes were positive. T2,N1a lesion. This was a triple negative lesion  . Shingles Dec 2015  . Vaginal atrophy 08/10/2015   HOSPITAL COURSE:  83 year old female patient with chest pain, shortness of breath secondary to GI bleed with black stool, symptomatic anemia. Assessment and plan   #GI bleed Suspect chronic in nature and felt to be due to lower accordingly GI due to the microcytic nature of it. She took a lot NSAIDS.  EGD- normal except for mild esophagitis and large hiatal hernia. Patient doesn't want a colonoscopy however her son has convinced her to get a colonoscopy.  Unable to get colonoscopy due to unsustained V-tach this am.  Colonoscopy today: Normal except a small polyp which was excised. Follow-up with Dr. Vicente Males as  outpatient.  * Symptomatic anemia due to acute blood loss.  Hb was 6.8. s/p transfusion of 2 units of packed RBC. Hb up to 10.4 but down to 8.5. stable at 8.8 today.  #Shingles on under right breast: Continue airborne and contact precautions, continue on Valtrex 1 g by mouth 2 times a day. Started on lyrica  # history of colon cancer, breast cancer: .  #essential hypertension continue metoprolol  Hypokalemia. Give potassium supplement and follow-up level as outpatient. UTI. Fever this am, but no dysuria. UA: too numerous WBC, start keflex and follow up culture with PCP as outpatient.  PT evaluation suggested skilled nursing facility placement. But the patient's daughter and son wants the patient to go home with home health and PT.  DISCHARGE CONDITIONS:  Stable, discharge to home with home health and PT today. CONSULTS OBTAINED:  Treatment Team:  Isaias Cowman, MD DRUG ALLERGIES:   Allergies  Allergen Reactions  . Codeine Nausea And Vomiting   DISCHARGE MEDICATIONS:   Allergies as of 08/25/2016      Reactions   Codeine Nausea And Vomiting      Medication List    STOP taking these medications   naproxen sodium 220 MG tablet Commonly known as:  ANAPROX     TAKE these medications   ALIGN 4 MG Caps Take 1 capsule by mouth daily.   cephALEXin 500 MG capsule Commonly known as:  KEFLEX Take 1 capsule (500 mg total) by mouth 2 (  two) times daily.   colestipol 1 g tablet Commonly known as:  COLESTID Take 1 tablet by mouth 2 (two) times daily.   Cranberry 500 MG Caps Take by mouth daily.   estradiol 0.1 MG/GM vaginal cream Commonly known as:  ESTRACE VAGINAL Apply 0.84m (pea-sized amount)  just inside the vaginal introitus with a finger-tip every night for two weeks and then Monday, Wednesday and Friday nights.   gabapentin 100 MG capsule Commonly known as:  NEURONTIN Take on tablet each night for two weeks. Increase to two tablets nightly if no  improvement.   IMODIUM A-D 2 MG tablet Generic drug:  loperamide Take 2 mg by mouth as needed for diarrhea or loose stools.   metoprolol succinate 25 MG 24 hr tablet Commonly known as:  TOPROL-XL Take 25 mg by mouth daily.   NASACORT ALLERGY 24HR 55 MCG/ACT Aero nasal inhaler Generic drug:  triamcinolone Place 2 sprays into the nose as needed.   OXYQUINOLONE SULFATE VAGINAL 0.025 % Gel Place 1 Applicatorful vaginally 2 (two) times a week.   pantoprazole 40 MG tablet Commonly known as:  PROTONIX Take 1 tablet (40 mg total) by mouth 2 (two) times daily before a meal.   Potassium Gluconate 595 MG Caps Take 1 capsule by mouth daily.   ranitidine 150 MG tablet Commonly known as:  ZANTAC Take by mouth.   temazepam 15 MG capsule Commonly known as:  RESTORIL Take 15 mg by mouth at bedtime.   valACYclovir 1000 MG tablet Commonly known as:  VALTREX Take 1 tablet (1,000 mg total) by mouth every 12 (twelve) hours.   vitamin B-12 1000 MCG tablet Commonly known as:  CYANOCOBALAMIN Take 1,000 mcg by mouth daily.        DISCHARGE INSTRUCTIONS:  See AVS.  If you experience worsening of your admission symptoms, develop shortness of breath, life threatening emergency, suicidal or homicidal thoughts you must seek medical attention immediately by calling 911 or calling your MD immediately  if symptoms less severe.  You Must read complete instructions/literature along with all the possible adverse reactions/side effects for all the Medicines you take and that have been prescribed to you. Take any new Medicines after you have completely understood and accpet all the possible adverse reactions/side effects.   Please note  You were cared for by a hospitalist during your hospital stay. If you have any questions about your discharge medications or the care you received while you were in the hospital after you are discharged, you can call the unit and asked to speak with the hospitalist on  call if the hospitalist that took care of you is not available. Once you are discharged, your primary care physician will handle any further medical issues. Please note that NO REFILLS for any discharge medications will be authorized once you are discharged, as it is imperative that you return to your primary care physician (or establish a relationship with a primary care physician if you do not have one) for your aftercare needs so that they can reassess your need for medications and monitor your lab values.    On the day of Discharge:  VITAL SIGNS:  Blood pressure 127/76, pulse 84, temperature 100 F (37.8 C), temperature source Oral, resp. rate 20, height 5' 6" (1.676 m), weight 152 lb (68.9 kg), SpO2 95 %. PHYSICAL EXAMINATION:  GENERAL:  83y.o.-year-old patient lying in the bed with no acute distress.  EYES: Pupils equal, round, reactive to light and accommodation. No scleral icterus. Extraocular  muscles intact.  HEENT: Head atraumatic, normocephalic. Oropharynx and nasopharynx clear.  NECK:  Supple, no jugular venous distention. No thyroid enlargement, no tenderness.  LUNGS: Normal breath sounds bilaterally, no wheezing, rales,rhonchi or crepitation. No use of accessory muscles of respiration.  CARDIOVASCULAR: S1, S2 normal. No murmurs, rubs, or gallops.  ABDOMEN: Soft, non-tender, non-distended. Bowel sounds present. No organomegaly or mass.  EXTREMITIES: No pedal edema, cyanosis, or clubbing.  NEUROLOGIC: Cranial nerves II through XII are intact. Muscle strength 4/5 in all extremities. Sensation intact. Gait not checked.  PSYCHIATRIC: The patient is alert and oriented x 3.  SKIN: No obvious rash, lesion, or ulcer.  DATA REVIEW:   CBC  Recent Labs Lab 08/25/16 1112  WBC 11.4*  HGB 8.8*  HCT 27.2*  PLT 260    Chemistries   Recent Labs Lab 08/24/16 0635 08/25/16 0545  NA 138 136  K 3.1* 3.2*  CL 106 103  CO2 26 25  GLUCOSE 98 125*  BUN 10 7  CREATININE 0.54 0.58    CALCIUM 8.3* 8.1*  MG 1.9  --      Microbiology Results  Results for orders placed or performed during the hospital encounter of 03/22/16  C difficile quick scan w PCR reflex     Status: None   Collection Time: 03/22/16  8:50 AM  Result Value Ref Range Status   C Diff antigen NEGATIVE NEGATIVE Final   C Diff toxin NEGATIVE NEGATIVE Final   C Diff interpretation No C. difficile detected.  Final  Gastrointestinal Panel by PCR , Stool     Status: None   Collection Time: 03/22/16  8:50 AM  Result Value Ref Range Status   Campylobacter species NOT DETECTED NOT DETECTED Final   Plesimonas shigelloides NOT DETECTED NOT DETECTED Final   Salmonella species NOT DETECTED NOT DETECTED Final   Yersinia enterocolitica NOT DETECTED NOT DETECTED Final   Vibrio species NOT DETECTED NOT DETECTED Final   Vibrio cholerae NOT DETECTED NOT DETECTED Final   Enteroaggregative E coli (EAEC) NOT DETECTED NOT DETECTED Final   Enteropathogenic E coli (EPEC) NOT DETECTED NOT DETECTED Final   Enterotoxigenic E coli (ETEC) NOT DETECTED NOT DETECTED Final   Shiga like toxin producing E coli (STEC) NOT DETECTED NOT DETECTED Final   E. coli O157 NOT DETECTED NOT DETECTED Final   Shigella/Enteroinvasive E coli (EIEC) NOT DETECTED NOT DETECTED Final   Cryptosporidium NOT DETECTED NOT DETECTED Final   Cyclospora cayetanensis NOT DETECTED NOT DETECTED Final   Entamoeba histolytica NOT DETECTED NOT DETECTED Final   Giardia lamblia NOT DETECTED NOT DETECTED Final   Adenovirus F40/41 NOT DETECTED NOT DETECTED Final   Astrovirus NOT DETECTED NOT DETECTED Final   Norovirus GI/GII NOT DETECTED NOT DETECTED Final   Rotavirus A NOT DETECTED NOT DETECTED Final   Sapovirus (I, II, IV, and V) NOT DETECTED NOT DETECTED Final    RADIOLOGY:  No results found.   Management plans discussed with the patient, family and they are in agreement.  CODE STATUS: Full Code   TOTAL TIME TAKING CARE OF THIS PATIENT: 46 minutes.     Demetrios Loll M.D on 08/25/2016 at 2:08 PM  Between 7am to 6pm - Pager - [phone]  After 6pm go to [email or web address] - Proofreader  [facility]  Office  [phone]  CC: Primary care physician; [name], MD   Note: This dictation was prepared with Dragon dictation along with smaller phrase technology. Any transcriptional errors that result from  this process are unintentional.

## 2016-08-25 NOTE — H&P (Signed)
[name] MD [address], [place], [place] [address] Phone: [phone] Fax : [phone]  Primary Care Physician:  [name], MD Primary Gastroenterologist:  Dr. [name]   Pre-Procedure History & Physical: HPI:  [name] is a 83 y.o. female is here for an colonoscopy.   Past Medical History:  Diagnosis Date  . Arthritis    back  . Benign neoplasm of skin, site unspecified   . Bowel trouble   . Cancer ([place])    colon 20 years ago  . Cancer of breast [facility]) April 10,.2013   Left breast, 3.8 cm, 4 cm axillary node, T2, N1a, ER negative PR negative, HER-2/neu not amplified.  . Hard of hearing   . Heartburn   . Hiatal hernia   . History of stomach ulcers   . Hypertension 2012  . Personal history of malignant neoplasm of breast 2013   LEFT MASTECTOMY,left modified radical mastectomy on September 06, 2011 483.8 cm primary tumor with a 4.6 cm axillary metastasis. 3 of 13 nodes were positive. T2,N1a lesion. This was a triple negative lesion  . Shingles Dec 2015  . Vaginal atrophy 08/10/2015    Past Surgical History:  Procedure Laterality Date  . ABDOMINAL HYSTERECTOMY     42 YEARS AGO  . BREAST SURGERY Left 09-06-11   left modified radical mastectomy on September 06, 2011 483.8 cm primary tumor with a 4.6 cm axillary metastasis. 3 of 13 nodes were positive. T2,N1a lesion. This was a triple negative lesion  . CARPAL TUNNEL RELEASE Right September 25, 2014   Skip Estimable, M.D.  . COLON SURGERY  20 YEARS AGO  . COLONOSCOPY  2012   DR. ELLIOTT  . ESOPHAGOGASTRODUODENOSCOPY (EGD) WITH PROPOFOL N/A 08/23/2016   Procedure: ESOPHAGOGASTRODUODENOSCOPY (EGD) WITH PROPOFOL;  Surgeon: [name], MD;  Location: [facility] ENDOSCOPY;  Service: Endoscopy;  Laterality: N/A;  . MASTECTOMY Left 2013   left modified radical mastectomy on September 06, 2011 Stage 2; 3.8 cm primary tumor with a 4.6 cm axillary metastasis. 3 of 13 nodes were positive. T2,N1a lesion. This was a triple negative  lesion  . UPPER GI ENDOSCOPY  2012   BLEEDING    Prior to Admission medications   Medication Sig Start Date End Date Taking? Authorizing Provider  colestipol (COLESTID) 1 g tablet Take 1 tablet by mouth 2 (two) times daily. 05/15/16  Yes Historical Provider, MD  Cranberry 500 MG CAPS Take by mouth daily.   Yes Historical Provider, MD  gabapentin (NEURONTIN) 100 MG capsule Take on tablet each night for two weeks. Increase to two tablets nightly if no improvement. 09/09/15  Yes Robert Bellow, MD  metoprolol succinate (TOPROL-XL) 25 MG 24 hr tablet Take 25 mg by mouth daily.   Yes Historical Provider, MD  naproxen sodium (ANAPROX) 220 MG tablet Take 220 mg by mouth 2 (two) times daily with a meal.   Yes Historical Provider, MD  Potassium Gluconate 595 MG CAPS Take 1 capsule by mouth daily.    Yes Historical Provider, MD  Probiotic Product (ALIGN) 4 MG CAPS Take 1 capsule by mouth daily.    Yes Historical Provider, MD  ranitidine (ZANTAC) 150 MG tablet Take by mouth. 02/14/16 02/13/17 Yes Historical Provider, MD  temazepam (RESTORIL) 15 MG capsule Take 15 mg by mouth at bedtime.   Yes Historical Provider, MD  vitamin B-12 (CYANOCOBALAMIN) 1000 MCG tablet Take 1,000 mcg by mouth daily.   Yes Historical Provider, MD  estradiol (ESTRACE VAGINAL) 0.1 MG/GM  vaginal cream Apply 0.49m (pea-sized amount)  just inside the vaginal introitus with a finger-tip every night for two weeks and then Monday, Wednesday and Friday nights. 07/19/15   SNori Riis PA-C  loperamide (IMODIUM A-D) 2 MG tablet Take 2 mg by mouth as needed for diarrhea or loose stools.    Historical Provider, MD  OXYQUINOLONE SULFATE VAGINAL 0.025 % GEL Place 1 Applicatorful vaginally 2 (two) times a week. 09/09/15   ARubie Maid MD  triamcinolone (NASACORT ALLERGY 24HR) 55 MCG/ACT AERO nasal inhaler Place 2 sprays into the nose as needed.     Historical Provider, MD    Allergies as of 08/21/2016 - Review Complete 08/21/2016    Allergen Reaction Noted  . Codeine Nausea And Vomiting 07/27/2012    Family History  Problem Relation Age of Onset  . Lung cancer Brother     colono ca  . Skin cancer Daughter   . Leukemia Father   . Colon cancer Mother   . Skin cancer Son   . Kidney disease Neg Hx   . Bladder Cancer Neg Hx     Social History   Social History  . Marital status: Widowed    Spouse name: N/A  . Number of children: N/A  . Years of education: N/A   Occupational History  . Not on file.   Social History Main Topics  . Smoking status: Never Smoker  . Smokeless tobacco: Never Used  . Alcohol use No  . Drug use: No  . Sexual activity: No   Other Topics Concern  . Not on file   Social History Narrative  . No narrative on file    Review of Systems: See HPI, otherwise negative ROS  Physical Exam: BP (!) 153/67   Pulse 88   Temp (!) 101.3 F (38.5 C) (Tympanic)   Resp (!) 28   Ht '5\' 6"'  (1.676 m)   Wt 152 lb (68.9 kg)   SpO2 94%   BMI 24.53 kg/m  General:   Alert,  pleasant and cooperative in NAD Head:  Normocephalic and atraumatic. Neck:  Supple; no masses or thyromegaly. Lungs:  Clear throughout to auscultation.    Heart:  Regular rate and rhythm. Abdomen:  Soft, nontender and nondistended. Normal bowel sounds, without guarding, and without rebound.   Neurologic:  Alert and  oriented x4;  grossly normal neurologically.  Impression/Plan: Sandra MONTANAROis here for an colonoscopy to be performed for iron deficiency anemia   Risks, benefits, limitations, and alternatives regarding  colonoscopy have been reviewed with the patient. I have also discussed with the 2 sons who are involved with her care.  Questions have been answered.  All parties agreeable.   [name] MD  08/25/2016, 7:41 AM

## 2016-08-25 NOTE — Progress Notes (Signed)
Colonoscopy  Normal except a small polyp which was excised  Plan   1. Advance diet  2. IV iron if not already received 3. PCP to follow up CBC  4. OP GI appointment to discuss about capsule study if warranted  I will sign off.  Please call me if any further GI concerns or questions.  We would like to thank you for the opportunity to participate in the care of [name].  Dr [name]  Gastroenterology/Hepatology Pager: [phone]

## 2016-08-25 NOTE — Anesthesia Post-op Follow-up Note (Cosign Needed)
Anesthesia QCDR form completed.        

## 2016-08-25 NOTE — Discharge Instructions (Signed)
Heart healthy diet

## 2016-08-25 NOTE — Progress Notes (Signed)
Iv and tele removed. Discharge instructions given to pt. Prescriptions given to pt. Pt has no further concerns at this time.

## 2016-08-25 NOTE — Transfer of Care (Addendum)
   Immediate Anesthesia Transfer of Care Note  Patient: [name]  Procedure(s) Performed: Procedure(s): COLONOSCOPY WITH PROPOFOL (N/A)  Patient Location: PACU  Anesthesia Type:General  Level of Consciousness: awake and sedated  Airway & Oxygen Therapy: Patient Spontanous Breathing and Patient connected to nasal cannula oxygen  Post-op Assessment: Report given to RN and Post -op Vital signs reviewed and stable  Post vital signs: Reviewed and stable  Last Vitals:  Vitals:   08/25/16 0458 08/25/16 0714  BP: (!) 161/75 (!) 153/67  Pulse: 91 88  Resp: 20 (!) 28  Temp: 36.7 C (!) 38.5 C    Last Pain:  Vitals:   08/25/16 0714  TempSrc: Tympanic  PainSc: 0-No pain      Patients Stated Pain Goal: 0 (83/77/93 9688)  Complications: No apparent anesthesia complications

## 2016-08-25 NOTE — Progress Notes (Signed)
PT Cancellation Note  Patient Details Name: [name] MRN: [id number] DOB: [birth year]   Cancelled Treatment:    Reason Eval/Treat Not Completed: Patient at procedure or test/unavailable (Pt currently off floor for colonoscopy).  Will attempt to see pt again this afternoon, schedule permitting.   Collie Siad PT, DPT 08/25/2016, 8:11 AM

## 2016-08-25 NOTE — Anesthesia Procedure Notes (Signed)
Performed by: COOK-MARTIN, Jaris Kohles Pre-anesthesia Checklist: Patient identified, Emergency Drugs available, Suction available, Patient being monitored and Timeout performed Patient Re-evaluated:Patient Re-evaluated prior to inductionOxygen Delivery Method: Simple face mask Preoxygenation: Pre-oxygenation with 100% oxygen Intubation Type: IV induction Placement Confirmation: positive ETCO2 and CO2 detector       

## 2016-08-25 NOTE — Op Note (Signed)
[facility] Gastroenterology Patient Name: [name] Procedure Date: 08/25/2016 7:41 AM MRN: [id number] Account #: [id number] Date of Birth: September 10, 1920 Admit Type: Inpatient Age: 83 Room: [facility] ENDO ROOM 4 Gender: Female Note Status: Finalized Procedure:            Colonoscopy Indications:          Iron deficiency anemia Providers:            [name] MD, MD Referring MD:         Irven Easterly. Kary Kos, MD (Referring MD) Medicines:            Monitored Anesthesia Care Complications:        No immediate complications. Procedure:            Pre-Anesthesia Assessment:                       - Prior to the procedure, a History and Physical was                        performed, and patient medications, allergies and                        sensitivities were reviewed. The patient's tolerance of                        previous anesthesia was reviewed.                       - The risks and benefits of the procedure and the                        sedation options and risks were discussed with the                        patient. All questions were answered and informed                        consent was obtained.                       - ASA Grade Assessment: III - A patient with severe                        systemic disease.                       After obtaining informed consent, the colonoscope was                        passed under direct vision. Throughout the procedure,                        the patient's blood pressure, pulse, and oxygen                        saturations were monitored continuously. The                        Colonoscope was introduced through the anus and  advanced to the the ileocolonic anastomosis. The                        colonoscopy was performed with ease. The patient                        tolerated the procedure well. The quality of the bowel                        preparation was good. Findings:      The perianal  and digital rectal examinations were normal.      Non-bleeding internal hemorrhoids were found during retroflexion. The       hemorrhoids were small and Grade I (internal hemorrhoids that do not       prolapse).      A few small-mouthed diverticula were found in the sigmoid colon and       descending colon.      A 10 mm polyp was found in the transverse colon. The polyp was       semi-sessile. The polyp was removed with a hot snare. Resection and       retrieval were complete.      The anastomosis appeared normal.      The colon (entire examined portion) appeared normal. Appears to have had       a right hemicolectomy Impression:           - Non-bleeding internal hemorrhoids.                       - Diverticulosis in the sigmoid colon and in the                        descending colon.                       - One 10 mm polyp in the transverse colon, removed with                        a hot snare. Resected and retrieved.                       - The colonic anastomosis is normal.                       - The entire examined colon is normal. Recommendation:       - Return patient to hospital ward for ongoing care.                       - Advance diet as tolerated.                       - Continue present medications.                       - Await pathology results.                       - Return to my office in 4 weeks.                       - As an out patient will discuss patients goals and  determine if a capsule study would be appropriate to                        evaluate for small bowel pathology as cause of iron                        deficiency anemia. Stop all NSAID's Procedure Code(s):    --- Professional ---                       [phone], Colonoscopy, flexible; with removal of tumor(s),                        polyp(s), or other lesion(s) by snare technique Diagnosis Code(s):    --- Professional ---                       K64.0, First degree hemorrhoids                        D12.3, Benign neoplasm of transverse colon (hepatic                        flexure or splenic flexure)                       D50.9, Iron deficiency anemia, unspecified                       K57.30, Diverticulosis of large intestine without                        perforation or abscess without bleeding CPT copyright 2016 American Medical Association. All rights reserved. The codes documented in this report are preliminary and upon coder review may  be revised to meet current compliance requirements. [name], MD [name] MD, MD 08/25/2016 8:11:04 AM This report has been signed electronically. Number of Addenda: 0 Note Initiated On: 08/25/2016 7:41 AM Scope Withdrawal Time: 0 hours 5 minutes 19 seconds  Total Procedure Duration: 0 hours 8 minutes 16 seconds       [facility]

## 2016-08-25 NOTE — Anesthesia Preprocedure Evaluation (Signed)
Anesthesia Evaluation  Patient identified by MRN, date of birth, ID band Patient awake    Reviewed: Allergy & Precautions, NPO status , Patient's Chart, lab work & pertinent test results, reviewed documented beta blocker date and time   Airway Mallampati: II  TM Distance: >3 FB     Dental  (+) Chipped   Pulmonary           Cardiovascular hypertension, Pt. on medications      Neuro/Psych  Neuromuscular disease    GI/Hepatic hiatal hernia, GERD  ,  Endo/Other    Renal/GU      Musculoskeletal  (+) Arthritis ,   Abdominal   Peds  Hematology  (+) anemia ,   Anesthesia Other Findings Runs of VT last nite. Cardiology has seen and OKd. K being replaced.  Reproductive/Obstetrics                             Anesthesia Physical Anesthesia Plan  ASA: III  Anesthesia Plan: General   Post-op Pain Management:    Induction:   Airway Management Planned:   Additional Equipment:   Intra-op Plan:   Post-operative Plan:   Informed Consent: I have reviewed the patients History and Physical, chart, labs and discussed the procedure including the risks, benefits and alternatives for the proposed anesthesia with the patient or authorized representative who has indicated his/her understanding and acceptance.     Plan Discussed with: CRNA  Anesthesia Plan Comments:         Anesthesia Quick Evaluation

## 2016-08-25 NOTE — Anesthesia Postprocedure Evaluation (Signed)
Anesthesia Post Note  Patient: [name]  Procedure(s) Performed: Procedure(s) (LRB): COLONOSCOPY WITH PROPOFOL (N/A)  Patient location during evaluation: Endoscopy Anesthesia Type: General Level of consciousness: awake and alert Pain management: pain level controlled Vital Signs Assessment: post-procedure vital signs reviewed and stable Respiratory status: spontaneous breathing, nonlabored ventilation, respiratory function stable and patient connected to nasal cannula oxygen Cardiovascular status: blood pressure returned to baseline and stable Postop Assessment: no signs of nausea or vomiting Anesthetic complications: no     Last Vitals:  Vitals:   08/25/16 0830 08/25/16 0840  BP: 121/67 110/76  Pulse: 88 87  Resp: (!) 27 (!) 27  Temp:      Last Pain:  Vitals:   08/25/16 0810  TempSrc: Tympanic  PainSc:                  [name] S

## 2016-08-26 LAB — URINE CULTURE

## 2016-08-28 ENCOUNTER — Encounter: Payer: Self-pay | Admitting: Gastroenterology

## 2016-08-28 LAB — SURGICAL PATHOLOGY

## 2016-08-29 ENCOUNTER — Other Ambulatory Visit: Payer: Self-pay

## 2016-08-29 ENCOUNTER — Telehealth: Payer: Self-pay

## 2016-08-29 NOTE — Telephone Encounter (Signed)
-----   Message from [name], MD sent at 08/28/2016 12:06 PM EDT ----- Tubular adenoma of colon- no future colonoscopy due to age

## 2016-08-29 NOTE — Telephone Encounter (Signed)
Spoke with patient and son.   Advised no additional colonoscopy recommendation.   Family was very happy with Dr. Vicente Males.

## 2016-09-06 ENCOUNTER — Ambulatory Visit (INDEPENDENT_AMBULATORY_CARE_PROVIDER_SITE_OTHER): Payer: Medicare Other | Admitting: Obstetrics and Gynecology

## 2016-09-06 ENCOUNTER — Encounter: Payer: Self-pay | Admitting: Obstetrics and Gynecology

## 2016-09-06 VITALS — BP 167/65 | HR 92 | Ht 64.0 in | Wt 153.9 lb

## 2016-09-06 DIAGNOSIS — N8111 Cystocele, midline: Secondary | ICD-10-CM

## 2016-09-06 DIAGNOSIS — R339 Retention of urine, unspecified: Secondary | ICD-10-CM

## 2016-09-06 DIAGNOSIS — Z87440 Personal history of urinary (tract) infections: Secondary | ICD-10-CM | POA: Diagnosis not present

## 2016-09-06 DIAGNOSIS — I10 Essential (primary) hypertension: Secondary | ICD-10-CM | POA: Diagnosis not present

## 2016-09-06 DIAGNOSIS — Z4689 Encounter for fitting and adjustment of other specified devices: Secondary | ICD-10-CM | POA: Diagnosis not present

## 2016-09-06 DIAGNOSIS — N952 Postmenopausal atrophic vaginitis: Secondary | ICD-10-CM

## 2016-09-06 NOTE — Progress Notes (Signed)
    GYNECOLOGY PROGRESS NOTE  Subjective:    Patient ID: [name], female    DOB: 1920-05-31, 83 y.o.   MRN: [id number]  HPI  Patient is a 83 y.o. [id number] female who presents for pessary check.   Denies vaginal bleeding or abnormal discharge.  She denies difficulty voiding or passing stools.   The following portions of the patient's history were reviewed and updated as appropriate: allergies, current medications, past family history, past medical history, past social history, past surgical history and problem list.  Review of Systems A comprehensive review of systems was negative except for: Gastrointestinal: positive for loose stools ((however notes that this is chronic problem).   Objective:   Blood pressure (!) 167/65, pulse 92, height 5\' 4"  (1.626 m), weight 153 lb 14.4 oz (69.8 kg). General appearance: alert and no distress Abdomen: soft, non-tender; bowel sounds normal; no masses,  no organomegaly.  Pelvic:  The patient's size 5 pessary was removed, cleaned and replaced without complications.  Speculum examination revealed normal vaginal mucosa with no lesions or lacerations   Labs:   Assessment:   Pessary maintenance H/o incomplete bladder emptying H/o recurrent UTIs.  Cystocele with vaginal vault prolapse Vaginal atrophy (moderate)  Elevated BP (with h/o HTN)  Plan:    - Pessary cleaned today.  The patient should return in 10-12 weeks for a pessary check.  Continue to use Trimo-San gel internally twice weekly, and continue to use Premarin cream samples for external use near urethral meatus for h/o recurrent UTIs.   - Patient with elevated BP today, has a h/o HTN, usually fairly controlled. Denies symptoms. Notes taking medications as prescribed.    Rubie Maid, MD [facility]

## 2016-09-20 ENCOUNTER — Ambulatory Visit: Payer: Self-pay | Admitting: General Surgery

## 2016-10-03 ENCOUNTER — Other Ambulatory Visit
Admission: RE | Admit: 2016-10-03 | Discharge: 2016-10-03 | Disposition: A | Discharge status: Home / Self Care | Payer: Medicare Other | Source: Ambulatory Visit | Attending: Gastroenterology | Admitting: Gastroenterology

## 2016-10-03 ENCOUNTER — Encounter: Payer: Self-pay | Admitting: Gastroenterology

## 2016-10-03 ENCOUNTER — Ambulatory Visit (INDEPENDENT_AMBULATORY_CARE_PROVIDER_SITE_OTHER): Payer: Medicare Other | Admitting: Gastroenterology

## 2016-10-03 VITALS — BP 164/71 | HR 83 | Ht 64.0 in | Wt 154.8 lb

## 2016-10-03 DIAGNOSIS — D509 Iron deficiency anemia, unspecified: Secondary | ICD-10-CM | POA: Insufficient documentation

## 2016-10-03 LAB — CBC WITH DIFFERENTIAL/PLATELET
BASOS ABS: 0.1 10*3/uL (ref 0–0.1)
Basophils Relative: 1 %
Eosinophils Absolute: 0.1 10*3/uL (ref 0–0.7)
Eosinophils Relative: 2 %
HEMATOCRIT: 30.4 % — AB (ref 35.0–47.0)
HEMOGLOBIN: 9.5 g/dL — AB (ref 12.0–16.0)
LYMPHS ABS: 2 10*3/uL (ref 1.0–3.6)
Lymphocytes Relative: 25 %
MCH: 22 pg — ABNORMAL LOW (ref 26.0–34.0)
MCHC: 31.4 g/dL — ABNORMAL LOW (ref 32.0–36.0)
MCV: 70 fL — AB (ref 80.0–100.0)
Monocytes Absolute: 0.8 10*3/uL (ref 0.2–0.9)
Monocytes Relative: 10 %
NEUTROS ABS: 4.8 10*3/uL (ref 1.4–6.5)
NEUTROS PCT: 62 %
PLATELETS: 385 10*3/uL (ref 150–440)
RBC: 4.34 MIL/uL (ref 3.80–5.20)
RDW: 20.7 % — ABNORMAL HIGH (ref 11.5–14.5)
WBC: 7.8 10*3/uL (ref 3.6–11.0)

## 2016-10-03 NOTE — Progress Notes (Signed)
Primary Care Physician: [name], MD  Primary Gastroenterologist:  Dr. [name]   Chief Complaint  Patient presents with  . Hospitalization Follow-up    HPI: [name] is a 83 y.o. female She is here today for a hospital follow up post discharge . She was admitted on 08/21/16 for a GI bleed. She used to suffer from chronic diarrhea and see Dr Tiffany Kocher in the past . She also has a history of GERD , colon cancer and breast cancer . On admission she was noted to be short of breath, have melena, acute drop in HB , microcytic anemia. She had been taking NSAID on a regular basis for a few weeks prior to the event.   I performed an EGD and found mild esophagitis, large hiatal hernia . I followed it up with a colonoscopy which was essentially normal except for a small polyp.    Since discharge 3/3-/2018-10/03/2016   Urine shows no blood  Polyp excised in the colon was an adenoma..  Doing well , brown stool , no blood in stool , feels well. Not on any NSAID's . Taking Zantac BID.     Current Outpatient Prescriptions  Medication Sig Dispense Refill  . acetaminophen (TYLENOL) 650 MG CR tablet Take by mouth.    . cefUROXime (CEFTIN) 500 MG tablet     . cephALEXin (KEFLEX) 500 MG capsule Take 1 capsule (500 mg total) by mouth 2 (two) times daily. 10 capsule 0  . colestipol (COLESTID) 1 g tablet Take 1 tablet by mouth 2 (two) times daily.    . Cranberry 500 MG CAPS Take by mouth daily.    Marland Kitchen estradiol (ESTRACE VAGINAL) 0.1 MG/GM vaginal cream Apply 0.5mg  (pea-sized amount)  just inside the vaginal introitus with a finger-tip every night for two weeks and then Monday, Wednesday and Friday nights. 30 g 12  . gabapentin (NEURONTIN) 100 MG capsule Take on tablet each night for two weeks. Increase to two tablets nightly if no improvement. 90 capsule 6  . loperamide (IMODIUM A-D) 2 MG tablet Take 2 mg by mouth as needed for diarrhea or loose stools.    . metoprolol succinate (TOPROL-XL) 25 MG  24 hr tablet Take 25 mg by mouth daily.    Levin Erp SULFATE VAGINAL 0.025 % GEL Place 1 Applicatorful vaginally 2 (two) times a week. 1 Tube 3  . pantoprazole (PROTONIX) 40 MG tablet Take 1 tablet (40 mg total) by mouth 2 (two) times daily before a meal. 60 tablet 0  . Potassium 99 MG TABS Take by mouth.    . Potassium Gluconate 595 MG CAPS Take 1 capsule by mouth daily.     . Probiotic Product (ALIGN) 4 MG CAPS Take 1 capsule by mouth daily.     . ranitidine (ZANTAC) 150 MG tablet Take by mouth.    . sulfamethoxazole-trimethoprim (BACTRIM DS,SEPTRA DS) 800-160 MG tablet     . temazepam (RESTORIL) 15 MG capsule Take 15 mg by mouth at bedtime.    . traMADol (ULTRAM) 50 MG tablet     . triamcinolone (NASACORT ALLERGY 24HR) 55 MCG/ACT AERO nasal inhaler Place 2 sprays into the nose as needed.     . valACYclovir (VALTREX) 1000 MG tablet Take 1 tablet (1,000 mg total) by mouth every 12 (twelve) hours. 6 tablet 0  . vitamin B-12 (CYANOCOBALAMIN) 1000 MCG tablet Take 1,000 mcg by mouth daily.     No current facility-administered medications for this visit.  Allergies as of 10/03/2016 - Review Complete 10/03/2016  Allergen Reaction Noted  . Codeine Nausea And Vomiting 07/27/2012    ROS:  General: Negative for anorexia, weight loss, fever, chills, fatigue, weakness. ENT: Negative for hoarseness, difficulty swallowing , nasal congestion. CV: Negative for chest pain, angina, palpitations, dyspnea on exertion, peripheral edema.  Respiratory: Negative for dyspnea at rest, dyspnea on exertion, cough, sputum, wheezing.  GI: See history of present illness. GU:  Negative for dysuria, hematuria, urinary incontinence, urinary frequency, nocturnal urination.  Endo: Negative for unusual weight change.    Physical Examination:   BP (!) 164/71 (BP Location: Right Arm, Patient Position: Sitting, Cuff Size: Normal)   Pulse 83   Ht 5\' 4"  (1.626 m)   Wt 154 lb 12.8 oz (70.2 kg)   BMI 26.57 kg/m     General: Well-nourished, well-developed in no acute distress.  Eyes: No icterus. Conjunctivae pink. Mouth: Oropharyngeal mucosa moist and pink , no lesions erythema or exudate. Lungs: Clear to auscultation bilaterally. Non-labored. Heart: Regular rate and rhythm, no murmurs rubs or gallops.  Abdomen: Bowel sounds are normal, nontender, nondistended, no hepatosplenomegaly or masses, no abdominal bruits or hernia , no rebound or guarding.   Extremities: No lower extremity edema. No clubbing or deformities. Neuro: Alert and oriented x 3.  Grossly intact. Skin: Warm and dry, no jaundice.   Psych: Alert and cooperative, normal mood and affect.  Imaging Studies: No results found.  Assessment and Plan:   [name] is a 83 y.o. y/o female here to follow up from hospital discharge for Iron deficiency anemia with prior long term use of NSAID's Very likely the anemia was from chronic NSAID use. EGD+colonoscopy was negative for source of blood loss.   Plan  1. Check CBC to ensure its stable  2. Continue Zantac  3. If CBC is stable then we can avoid capsule study but if she continues to be anemic then would need small bowel capsule study .   Dr [name]  MD Follow up in 2 months

## 2016-10-10 ENCOUNTER — Other Ambulatory Visit: Payer: Self-pay

## 2016-10-10 DIAGNOSIS — Z87440 Personal history of urinary (tract) infections: Secondary | ICD-10-CM

## 2016-10-10 DIAGNOSIS — R399 Unspecified symptoms and signs involving the genitourinary system: Secondary | ICD-10-CM

## 2016-10-10 MED ORDER — SULFAMETHOXAZOLE-TRIMETHOPRIM 800-160 MG PO TABS: 1.0000 | ORAL_TABLET | Freq: Two times a day (BID) | ORAL | 0 refills | Status: AC

## 2016-10-11 ENCOUNTER — Telehealth: Payer: Self-pay | Admitting: Gastroenterology

## 2016-10-11 NOTE — Telephone Encounter (Signed)
Patient's daughter called and is wanting her mother's  lab results.

## 2016-10-12 ENCOUNTER — Telehealth: Payer: Self-pay

## 2016-10-12 ENCOUNTER — Other Ambulatory Visit: Payer: Self-pay

## 2016-10-12 DIAGNOSIS — D509 Iron deficiency anemia, unspecified: Secondary | ICD-10-CM

## 2016-10-12 MED ORDER — FERROUS SULFATE 325 (65 FE) MG PO TABS: 325.0000 mg | ORAL_TABLET | Freq: Two times a day (BID) | ORAL | 3 refills | Status: DC

## 2016-10-12 NOTE — Telephone Encounter (Signed)
-----   Message from [name], MD sent at 10/09/2016 11:16 AM EDT ----- Inform HB is stable and has not dropped. Enquire if she is taking oral iron - if not then needs to start ferrous sulphate 325 mg BID and repeat CBC in 4 weeks

## 2016-10-12 NOTE — Telephone Encounter (Signed)
Advised patient of results per Dr. Vicente Males.   Inform HB is stable and has not dropped. Enquire if she is taking oral iron - if not then needs to start ferrous sulphate 325 mg BID and repeat CBC in 4 weeks

## 2016-10-13 ENCOUNTER — Telehealth: Payer: Self-pay

## 2016-10-13 NOTE — Telephone Encounter (Signed)
Advised daughter of patient results and iron @ pharmacy for pick-up.

## 2016-10-18 LAB — URINE CULTURE

## 2016-10-19 ENCOUNTER — Telehealth: Payer: Self-pay | Admitting: Obstetrics and Gynecology

## 2016-10-19 NOTE — Telephone Encounter (Signed)
Patient is still having a lot of back pain and finished the antibiotic Tuesday She thinks she may still have the uti

## 2016-10-20 NOTE — Telephone Encounter (Signed)
Can you please call this pt and have her scheduled w/ cherry for recurrent UTI. Thanks!

## 2016-10-25 NOTE — Telephone Encounter (Signed)
Scheduled for 10/27/2016 with patient

## 2016-10-27 ENCOUNTER — Ambulatory Visit (INDEPENDENT_AMBULATORY_CARE_PROVIDER_SITE_OTHER): Payer: Medicare Other | Admitting: Obstetrics and Gynecology

## 2016-10-27 VITALS — BP 193/94 | HR 88 | Ht 64.0 in | Wt 157.2 lb

## 2016-10-27 DIAGNOSIS — Z87440 Personal history of urinary (tract) infections: Secondary | ICD-10-CM | POA: Diagnosis not present

## 2016-10-27 DIAGNOSIS — I10 Essential (primary) hypertension: Secondary | ICD-10-CM

## 2016-10-27 LAB — POCT URINALYSIS DIPSTICK
Bilirubin, UA: NEGATIVE
GLUCOSE UA: NEGATIVE
Ketones, UA: NEGATIVE
NITRITE UA: NEGATIVE
PH UA: 6 (ref 5.0–8.0)
PROTEIN UA: NEGATIVE
Spec Grav, UA: 1.01 (ref 1.010–1.025)
UROBILINOGEN UA: 0.2 U/dL

## 2016-10-27 NOTE — Progress Notes (Signed)
    GYNECOLOGY PROGRESS NOTE  Subjective:    Patient ID: [name], female    DOB: [birth year], 83 y.o.   MRN: [id number]  HPI  Patient is a 83 y.o. [name] female who presents for follow up of UTI.  Patient has a h/o recurrent UTI's. Also has a pessary in place for incontinence and pelvic organ prolapse.  Had urine specimen run last week which noted supsected UTI however urine culture specimen was lost.  Patient was treated emperically for UTI. Returns today as she notes some residual symptoms, wants to make sure that UTI is gone.   The following portions of the patient's history were reviewed and updated as appropriate: allergies, current medications, past family history, past medical history, past social history, past surgical history and problem list.  Review of Systems Pertinent items noted in HPI and remainder of comprehensive ROS otherwise negative.   Objective:   Blood pressure (!) 193/94, pulse 88, height 5\' 4"  (1.626 m), weight 157 lb 3.2 oz (71.3 kg). General appearance: alert and no distress Abdomen: soft, non-tender; bowel sounds normal; no masses,  no organomegaly Back: Back: ROM normal, kyphosis present, no CVA tenderness    Labs:  Results for orders placed or performed in visit on 10/27/16  Urine culture  Result Value Ref Range   Urine Culture, Routine Final report (A)    Urine Culture result 1 Comment (A)   POCT urinalysis dipstick  Result Value Ref Range   Color, UA yellow    Clarity, UA clear    Glucose, UA neg    Bilirubin, UA neg    Ketones, UA neg    Spec Grav, UA 1.010 1.010 - 1.025   Blood, UA NHT    pH, UA 6.0 5.0 - 8.0   Protein, UA neg    Urobilinogen, UA 0.2 0.2 or 1.0 E.U./dL   Nitrite, UA neg    Leukocytes, UA Large (3+) (A) Negative    Assessment:  History of recurrent UTI HTN uncontrolled  Plan:   - UA negative except 3+ leukocytes. Advised on continuing with adequate hydration, Azo for residual symptoms Recently completed  antibiotic regimen.  Can f/u with culture.  - HTN, patient asymptomatic, notes taking meds as prescribed. Advised to f/u with PCP soon for possible readjustment of medications.  - F/u in 4 weeks for pessary check.    Sandra Maid, MD [facility]

## 2016-10-29 LAB — URINE CULTURE

## 2016-10-31 ENCOUNTER — Telehealth: Payer: Self-pay

## 2016-10-31 NOTE — Telephone Encounter (Signed)
Called pt's daughter, no answer. Unable to leave message as voicemail is full.

## 2016-10-31 NOTE — Telephone Encounter (Signed)
-----   Message from Rubie Maid, MD sent at 10/30/2016  6:27 PM EDT ----- Please inform patient that she has very trace bacteria in her urine, most likey residual since she was recently treated for an infection. No need for retreatment.

## 2016-11-01 NOTE — Telephone Encounter (Signed)
Called pt's daughter informed her of the message per Dr.Cherry. Daughter gave verbal understanding.

## 2016-11-08 ENCOUNTER — Encounter: Payer: Self-pay | Admitting: General Surgery

## 2016-11-08 ENCOUNTER — Ambulatory Visit (INDEPENDENT_AMBULATORY_CARE_PROVIDER_SITE_OTHER): Payer: Medicare Other | Admitting: General Surgery

## 2016-11-08 VITALS — BP 158/68 | HR 78 | Resp 14 | Ht 64.0 in | Wt 153.0 lb

## 2016-11-08 DIAGNOSIS — Z853 Personal history of malignant neoplasm of breast: Secondary | ICD-10-CM

## 2016-11-08 NOTE — Patient Instructions (Addendum)
Continue self breast exams. Call office for any new breast issues or concerns. Follow up in one year.  Follow up with your primary care doctor for shortness of breath and lab work.

## 2016-11-08 NOTE — Progress Notes (Signed)
Patient ID: [name], female   DOB: 22-Mar-1921, 83 y.o.   MRN: [id number]  Chief Complaint  Patient presents with  . Breast Cancer    HPI BRIE [name] is a 83 y.o. female here today for her one year follow up breast cancer. The patient states that she had some breast pain this weekend but it seems to be better now. She has been having some shortness of breath. She was found to be anemic and got a blood transfusion. She also recently had shingles. She had a colonoscopy and endoscopy done to try to find the cause of the blood loss. These did not find anything. Her latest blood results were improved. She is here today with her Daughter [name].  The patient has had marked improvement in her right hand neuropathy with the institution of Neurontin last Spring. HPI  Past Medical History:  Diagnosis Date  . Arthritis    back  . Benign neoplasm of skin, site unspecified   . Bowel trouble   . Cancer ([place])    colon 20 years ago  . Cancer of breast [facility]) April 10,.2013   Left breast, 3.8 cm, 4 cm axillary node, T2, N1a, ER negative PR negative, HER-2/neu not amplified.  . Hard of hearing   . Heartburn   . Hiatal hernia   . History of stomach ulcers   . Hypertension 2012  . Personal history of malignant neoplasm of breast 2013   LEFT MASTECTOMY,left modified radical mastectomy on September 06, 2011 483.8 cm primary tumor with a 4.6 cm axillary metastasis. 3 of 13 nodes were positive. T2,N1a lesion. This was a triple negative lesion  . Shingles Dec 2015  . Vaginal atrophy 08/10/2015    Past Surgical History:  Procedure Laterality Date  . ABDOMINAL HYSTERECTOMY     42 YEARS AGO  . BREAST SURGERY Left 09-06-11   left modified radical mastectomy on September 06, 2011 483.8 cm primary tumor with a 4.6 cm axillary metastasis. 3 of 13 nodes were positive. T2,N1a lesion. This was a triple negative lesion  . CARPAL TUNNEL RELEASE Right September 25, 2014   Skip Estimable, M.D.  . COLON SURGERY  20 YEARS AGO   . COLONOSCOPY  2012   DR. ELLIOTT  . COLONOSCOPY WITH PROPOFOL N/A 08/25/2016   Procedure: COLONOSCOPY WITH PROPOFOL;  Surgeon: [name], MD;  Location[facility] ENDOSCOPY;  Service: Gastroenterology;  Laterality: N/A;  . ESOPHAGOGASTRODUODENOSCOPY (EGD) WITH PROPOFOL N/A 08/23/2016   Procedure: ESOPHAGOGASTRODUODENOSCOPY (EGD) WITH PROPOFOL;  Surgeon: [name], MD;  Location: [facility] ENDOSCOPY;  Service: Endoscopy;  Laterality: N/A;  . MASTECTOMY Left 2013   left modified radical mastectomy on September 06, 2011 Stage 2; 3.8 cm primary tumor with a 4.6 cm axillary metastasis. 3 of 13 nodes were positive. T2,N1a lesion. This was a triple negative lesion  . UPPER GI ENDOSCOPY  2012   BLEEDING    Family History  Problem Relation Age of Onset  . Lung cancer Brother        colono ca  . Skin cancer Daughter   . Leukemia Father   . Colon cancer Mother   . Skin cancer Son   . Kidney disease Neg Hx   . Bladder Cancer Neg Hx     Social History Social History  Substance Use Topics  . Smoking status: Never Smoker  . Smokeless tobacco: Never Used  . Alcohol use No    Allergies  Allergen Reactions  . Codeine Nausea And Vomiting  Current Outpatient Prescriptions  Medication Sig Dispense Refill  . acetaminophen (TYLENOL) 650 MG CR tablet Take by mouth.    . colestipol (COLESTID) 1 g tablet Take 1 tablet by mouth 2 (two) times daily.    . Cranberry 500 MG CAPS Take by mouth daily.    Marland Kitchen estradiol (ESTRACE VAGINAL) 0.1 MG/GM vaginal cream Apply 0.83m (pea-sized amount)  just inside the vaginal introitus with a finger-tip every night for two weeks and then Monday, Wednesday and Friday nights. 30 g 12  . ferrous sulfate 325 (65 FE) MG tablet Take 1 tablet (325 mg total) by mouth 2 (two) times daily with a meal. 60 tablet 3  . gabapentin (NEURONTIN) 100 MG capsule Take on tablet each night for two weeks. Increase to two tablets nightly if no improvement. 90 capsule 6  . loperamide (IMODIUM A-D) 2  MG tablet Take 2 mg by mouth as needed for diarrhea or loose stools.    . metoprolol succinate (TOPROL-XL) 25 MG 24 hr tablet Take 25 mg by mouth daily.    .Levin ErpSULFATE VAGINAL 0.025 % GEL Place 1 Applicatorful vaginally 2 (two) times a week. 1 Tube 3  . Potassium 99 MG TABS Take by mouth.    . Potassium Gluconate 595 MG CAPS Take 1 capsule by mouth daily.     . Probiotic Product (ALIGN) 4 MG CAPS Take 1 capsule by mouth daily.     . ranitidine (ZANTAC) 150 MG tablet Take by mouth.    . temazepam (RESTORIL) 15 MG capsule Take 15 mg by mouth at bedtime.    . triamcinolone (NASACORT ALLERGY 24HR) 55 MCG/ACT AERO nasal inhaler Place 2 sprays into the nose as needed.     . vitamin B-12 (CYANOCOBALAMIN) 1000 MCG tablet Take 1,000 mcg by mouth daily.     No current facility-administered medications for this visit.     Review of Systems Review of Systems  Constitutional: Negative.   Respiratory: Negative.   Cardiovascular: Negative.     Blood pressure (!) 158/68, pulse 78, resp. rate 14, height 5' 4" (1.626 m), weight 153 lb (69.4 kg).  Physical Exam Physical Exam  Constitutional: She appears well-developed and well-nourished.  Eyes: Conjunctivae are normal. No scleral icterus.  Neck: Neck supple.  Cardiovascular: Normal rate, regular rhythm and normal heart sounds.   Pulmonary/Chest: Effort normal and breath sounds normal. Right breast exhibits no inverted nipple, no mass, no nipple discharge, no skin change and no tenderness. Left breast exhibits no inverted nipple, no mass, no nipple discharge, no skin change and no tenderness.  Left mastectomy site well healed.   Lymphadenopathy:    She has no cervical adenopathy.  Neurological: She is alert.  Skin: Skin is warm and dry.  Psychiatric: She has a normal mood and affect.    [facility]   Assessment    No evidence of recurrent breast cancer.    Plan    The patient will return in one year for  clinical exam.  The patient decided in 2016 she would no longer have screening mammograms.    HPI, Physical Exam, Assessment and Plan have been scribed under the direction and in the presence of JRobert Bellow MD  CConcepcion Living LPN I have completed the exam and reviewed the above documentation for accuracy and completeness.  I agree with the above.  DHaematologisthas been used and any errors in dictation or transcription are unintentional.  JHervey Ard M.D., F.A.C.S.  Robert Bellow 11/09/2016, 8:38 PM

## 2016-11-09 DIAGNOSIS — Z853 Personal history of malignant neoplasm of breast: Secondary | ICD-10-CM | POA: Insufficient documentation

## 2016-11-15 ENCOUNTER — Ambulatory Visit (INDEPENDENT_AMBULATORY_CARE_PROVIDER_SITE_OTHER): Payer: Medicare Other | Admitting: Obstetrics and Gynecology

## 2016-11-15 ENCOUNTER — Encounter: Payer: Self-pay | Admitting: Obstetrics and Gynecology

## 2016-11-15 VITALS — BP 136/57 | HR 96 | Ht 64.0 in | Wt 152.3 lb

## 2016-11-15 DIAGNOSIS — Z4689 Encounter for fitting and adjustment of other specified devices: Secondary | ICD-10-CM

## 2016-11-15 DIAGNOSIS — Z87440 Personal history of urinary (tract) infections: Secondary | ICD-10-CM

## 2016-11-15 DIAGNOSIS — N390 Urinary tract infection, site not specified: Secondary | ICD-10-CM

## 2016-11-15 DIAGNOSIS — N3000 Acute cystitis without hematuria: Secondary | ICD-10-CM

## 2016-11-15 DIAGNOSIS — N952 Postmenopausal atrophic vaginitis: Secondary | ICD-10-CM

## 2016-11-15 DIAGNOSIS — I10 Essential (primary) hypertension: Secondary | ICD-10-CM

## 2016-11-15 DIAGNOSIS — R339 Retention of urine, unspecified: Secondary | ICD-10-CM

## 2016-11-15 DIAGNOSIS — N8111 Cystocele, midline: Secondary | ICD-10-CM

## 2016-11-15 LAB — POCT URINALYSIS DIPSTICK
Bilirubin, UA: NEGATIVE
GLUCOSE UA: NEGATIVE
Ketones, UA: NEGATIVE
NITRITE UA: POSITIVE
SPEC GRAV UA: 1.025 (ref 1.010–1.025)
UROBILINOGEN UA: 0.2 U/dL
pH, UA: 6 (ref 5.0–8.0)

## 2016-11-15 MED ORDER — CIPROFLOXACIN HCL 500 MG PO TABS: 500.0000 mg | ORAL_TABLET | Freq: Two times a day (BID) | ORAL | 0 refills | Status: DC

## 2016-11-15 NOTE — Progress Notes (Signed)
    GYNECOLOGY PROGRESS NOTE  Subjective:    Patient ID: [name], female    DOB: 1921-05-03, 83 y.o.   MRN: [id number]  HPI  Patient is a 83 y.o. [id number] female who presents for pessary check.   Denies vaginal bleeding or abnormal discharge.  She denies difficulty voiding or passing stools.   Notes that her intermittent diarrhea has improved after being started on iron tablets.   The following portions of the patient's history were reviewed and updated as appropriate: allergies, current medications, past family history, past medical history, past social history, past surgical history and problem list.  Review of Systems A comprehensive review of systems was negative except for: feeling "not quite right" today.  Notes feeling drained, fatigued. Thinks she may have another UTI. Has been taking Cranberry tablets for 2-3 days.   Objective:   Blood pressure (!) 136/57, pulse 96, height 5\' 4"  (1.626 m), weight 152 lb 4.8 oz (69.1 kg). General appearance: alert and no distress Abdomen: soft, non-tender; bowel sounds normal; no masses,  no organomegaly.  Pelvic:  The patient's size 5 pessary was removed, cleaned and replaced without complications.  Speculum examination revealed normal vaginal mucosa with no lesions or lacerations   Labs:  Results for orders placed or performed in visit on 11/15/16  POCT urinalysis dipstick  Result Value Ref Range   Color, UA dark yellow    Clarity, UA cloudy    Glucose, UA neg    Bilirubin, UA neg    Ketones, UA neg    Spec Grav, UA 1.025 1.010 - 1.025   Blood, UA NHT    pH, UA 6.0 5.0 - 8.0   Protein, UA 30+    Urobilinogen, UA 0.2 0.2 or 1.0 E.U./dL   Nitrite, UA POSITIVE    Leukocytes, UA Large (3+) (A) Negative    Assessment:   Pessary maintenance H/o incomplete bladder emptying Urinary tract infection H/o recurrent UTIs.  Cystocele with vaginal vault prolapse Vaginal atrophy (moderate)  HTN (controlled)  Plan:    - Pessary  cleaned today.  The patient should return in 10-12 weeks for a pessary check.  Continue to use Trimo-San gel internally twice weekly, and continue to use Premarin cream samples for external use near urethral meatus for h/o recurrent UTIs.  Is using moist wipes for vaginal area and occasional tissue usage.  - UA today nitrite positive.  Will prescribe Cipro. Will send urine for culture.  - HTN better controlled today.     Rubie Maid, MD [facility]

## 2016-11-17 LAB — URINE CULTURE

## 2016-11-20 ENCOUNTER — Telehealth: Payer: Self-pay

## 2016-11-20 NOTE — Telephone Encounter (Signed)
-----   Message from Rubie Maid, MD sent at 11/18/2016  8:21 PM EDT ----- Urine Culture  Order: [id number]  Status:  Final result Visible to patient:  No (Not Released) Dx:  Acute cystitis without hematuria; His...  Notes recorded by Rubie Maid, MD on 11/18/2016 at 8:21 PM EDT Please inform of abnormal urine culture. Her current antibiotics should treat the infection.

## 2016-11-20 NOTE — Telephone Encounter (Signed)
Called pt's daughter, no answer. LM for pt informing her of information below.

## 2016-11-22 ENCOUNTER — Telehealth: Payer: Self-pay | Admitting: Gastroenterology

## 2016-11-22 ENCOUNTER — Other Ambulatory Visit: Payer: Self-pay

## 2016-11-22 DIAGNOSIS — D500 Iron deficiency anemia secondary to blood loss (chronic): Secondary | ICD-10-CM

## 2016-11-22 NOTE — Telephone Encounter (Signed)
Spoke with pt's daughter, Jon Billings [phone] regarding her low HgB level. Pt saw Dr. Vella Kohler today and a CBC was done showing the results. Per Dr. Vicente Males, she will need 1 unit of blood. A referral has been sent to the [facility] to set this up. He is also wanting her to get a CBC and Iron studies after the infusion to check her levels. I have explained the recommendations to her and she agreed. They will wait for the cancer center to call and schedule. Pt will call us if they have not heard from them.

## 2016-11-22 NOTE — Telephone Encounter (Signed)
Patient's daughter called and she has been sob and saw Dr. Raul Del at [facility]. He did labs on her and hemoglobin  Is at  7.3. What do they need to do? Please call stat.

## 2016-11-26 ENCOUNTER — Encounter: Payer: Self-pay | Admitting: Emergency Medicine

## 2016-11-26 ENCOUNTER — Observation Stay
Admission: EM | Admit: 2016-11-26 | Discharge: 2016-11-28 | Disposition: A | Discharge status: Home / Self Care | Payer: Medicare Other | Attending: Internal Medicine | Admitting: Internal Medicine

## 2016-11-26 DIAGNOSIS — Z853 Personal history of malignant neoplasm of breast: Secondary | ICD-10-CM | POA: Diagnosis not present

## 2016-11-26 DIAGNOSIS — K922 Gastrointestinal hemorrhage, unspecified: Principal | ICD-10-CM | POA: Insufficient documentation

## 2016-11-26 DIAGNOSIS — Z885 Allergy status to narcotic agent status: Secondary | ICD-10-CM | POA: Insufficient documentation

## 2016-11-26 DIAGNOSIS — I10 Essential (primary) hypertension: Secondary | ICD-10-CM | POA: Diagnosis not present

## 2016-11-26 DIAGNOSIS — M1990 Unspecified osteoarthritis, unspecified site: Secondary | ICD-10-CM | POA: Diagnosis not present

## 2016-11-26 DIAGNOSIS — Z9012 Acquired absence of left breast and nipple: Secondary | ICD-10-CM | POA: Insufficient documentation

## 2016-11-26 DIAGNOSIS — K449 Diaphragmatic hernia without obstruction or gangrene: Secondary | ICD-10-CM | POA: Insufficient documentation

## 2016-11-26 DIAGNOSIS — Z85038 Personal history of other malignant neoplasm of large intestine: Secondary | ICD-10-CM | POA: Diagnosis not present

## 2016-11-26 DIAGNOSIS — K219 Gastro-esophageal reflux disease without esophagitis: Secondary | ICD-10-CM | POA: Diagnosis not present

## 2016-11-26 DIAGNOSIS — G629 Polyneuropathy, unspecified: Secondary | ICD-10-CM | POA: Diagnosis not present

## 2016-11-26 DIAGNOSIS — Z86010 Personal history of colonic polyps: Secondary | ICD-10-CM | POA: Insufficient documentation

## 2016-11-26 DIAGNOSIS — Z66 Do not resuscitate: Secondary | ICD-10-CM | POA: Insufficient documentation

## 2016-11-26 DIAGNOSIS — Z79899 Other long term (current) drug therapy: Secondary | ICD-10-CM | POA: Insufficient documentation

## 2016-11-26 DIAGNOSIS — Z8619 Personal history of other infectious and parasitic diseases: Secondary | ICD-10-CM | POA: Insufficient documentation

## 2016-11-26 DIAGNOSIS — Z8719 Personal history of other diseases of the digestive system: Secondary | ICD-10-CM | POA: Insufficient documentation

## 2016-11-26 DIAGNOSIS — D649 Anemia, unspecified: Secondary | ICD-10-CM | POA: Diagnosis present

## 2016-11-26 DIAGNOSIS — K648 Other hemorrhoids: Secondary | ICD-10-CM | POA: Insufficient documentation

## 2016-11-26 LAB — BASIC METABOLIC PANEL
Anion gap: 12 (ref 5–15)
BUN: 13 mg/dL (ref 6–20)
CALCIUM: 8.6 mg/dL — AB (ref 8.9–10.3)
CHLORIDE: 106 mmol/L (ref 101–111)
CO2: 19 mmol/L — ABNORMAL LOW (ref 22–32)
CREATININE: 0.74 mg/dL (ref 0.44–1.00)
GFR calc Af Amer: >60 mL/min (ref >60)
GFR calc non Af Amer: >60 mL/min (ref >60)
Glucose, Bld: 205 mg/dL — ABNORMAL HIGH (ref 65–99)
Potassium: 3.8 mmol/L (ref 3.5–5.1)
SODIUM: 137 mmol/L (ref 135–145)

## 2016-11-26 LAB — URINALYSIS, COMPLETE (UACMP) WITH MICROSCOPIC
BILIRUBIN URINE: NEGATIVE
Glucose, UA: NEGATIVE mg/dL
Hgb urine dipstick: NEGATIVE
Ketones, ur: NEGATIVE mg/dL
Nitrite: NEGATIVE
PH: 5 (ref 5.0–8.0)
Protein, ur: NEGATIVE mg/dL
SPECIFIC GRAVITY, URINE: 1.017 (ref 1.005–1.030)

## 2016-11-26 LAB — HEMOGLOBIN AND HEMATOCRIT, BLOOD
HCT: 25.1 % — ABNORMAL LOW (ref 35.0–47.0)
Hemoglobin: 7.9 g/dL — ABNORMAL LOW (ref 12.0–16.0)

## 2016-11-26 LAB — CBC
HCT: 21.5 % — ABNORMAL LOW (ref 35.0–47.0)
Hemoglobin: 6.7 g/dL — ABNORMAL LOW (ref 12.0–16.0)
MCH: 22.5 pg — ABNORMAL LOW (ref 26.0–34.0)
MCHC: 31 g/dL — ABNORMAL LOW (ref 32.0–36.0)
MCV: 72.4 fL — AB (ref 80.0–100.0)
PLATELETS: 457 10*3/uL — AB (ref 150–440)
RBC: 2.97 MIL/uL — ABNORMAL LOW (ref 3.80–5.20)
RDW: 21.8 % — AB (ref 11.5–14.5)
WBC: 8.6 10*3/uL (ref 3.6–11.0)

## 2016-11-26 LAB — PREPARE RBC (CROSSMATCH)

## 2016-11-26 MED ORDER — FERROUS SULFATE 325 (65 FE) MG PO TABS
325.0000 mg | ORAL_TABLET | Freq: Two times a day (BID) | ORAL | Status: DC
  Administered 2016-11-26 – 2016-11-28 (×4): 325 mg via ORAL
  Filled 2016-11-26 (×4): qty 1

## 2016-11-26 MED ORDER — ONDANSETRON HCL 4 MG PO TABS: 4.0000 mg | ORAL_TABLET | Freq: Four times a day (QID) | ORAL | Status: DC | PRN

## 2016-11-26 MED ORDER — OXYQUINOLONE SULFATE 0.025 % VA GEL: 1.0000 | VAGINAL | Status: DC

## 2016-11-26 MED ORDER — ALBUTEROL SULFATE (2.5 MG/3ML) 0.083% IN NEBU: 2.5000 mg | INHALATION_SOLUTION | RESPIRATORY_TRACT | Status: DC | PRN

## 2016-11-26 MED ORDER — ONDANSETRON HCL 4 MG/2ML IJ SOLN: 4.0000 mg | Freq: Four times a day (QID) | INTRAMUSCULAR | Status: DC | PRN

## 2016-11-26 MED ORDER — PANTOPRAZOLE SODIUM 40 MG IV SOLR
40.0000 mg | Freq: Two times a day (BID) | INTRAVENOUS | Status: DC
Start: 2016-11-26 — End: 2016-11-28
  Administered 2016-11-26 – 2016-11-28 (×5): 40 mg via INTRAVENOUS
  Filled 2016-11-26 (×5): qty 40

## 2016-11-26 MED ORDER — SENNOSIDES-DOCUSATE SODIUM 8.6-50 MG PO TABS: 1.0000 | ORAL_TABLET | Freq: Every evening | ORAL | Status: DC | PRN

## 2016-11-26 MED ORDER — POTASSIUM GLUCONATE 595 MG PO CAPS: 1.0000 | ORAL_CAPSULE | Freq: Every day | ORAL | Status: DC

## 2016-11-26 MED ORDER — METOPROLOL SUCCINATE ER 25 MG PO TB24
25.0000 mg | ORAL_TABLET | Freq: Every day | ORAL | Status: DC
  Administered 2016-11-27 – 2016-11-28 (×2): 25 mg via ORAL
  Filled 2016-11-26 (×2): qty 1

## 2016-11-26 MED ORDER — RISAQUAD PO CAPS
1.0000 | ORAL_CAPSULE | Freq: Every day | ORAL | Status: DC
  Administered 2016-11-27 – 2016-11-28 (×2): 1 via ORAL
  Filled 2016-11-26 (×2): qty 1

## 2016-11-26 MED ORDER — TEMAZEPAM 15 MG PO CAPS
15.0000 mg | ORAL_CAPSULE | Freq: Every evening | ORAL | Status: DC | PRN
  Administered 2016-11-26 – 2016-11-27 (×2): 15 mg via ORAL
  Filled 2016-11-26 (×2): qty 1

## 2016-11-26 MED ORDER — ACETAMINOPHEN 325 MG PO TABS: 650.0000 mg | ORAL_TABLET | Freq: Four times a day (QID) | ORAL | Status: DC | PRN

## 2016-11-26 MED ORDER — SODIUM CHLORIDE 0.9 % IV SOLN
INTRAVENOUS | Status: DC
  Administered 2016-11-26 – 2016-11-27 (×2): via INTRAVENOUS

## 2016-11-26 MED ORDER — GABAPENTIN 100 MG PO CAPS
100.0000 mg | ORAL_CAPSULE | Freq: Three times a day (TID) | ORAL | Status: DC
  Administered 2016-11-26 – 2016-11-28 (×5): 100 mg via ORAL
  Filled 2016-11-26 (×5): qty 1

## 2016-11-26 MED ORDER — SODIUM CHLORIDE 0.9% FLUSH
3.0000 mL | Freq: Two times a day (BID) | INTRAVENOUS | Status: DC
  Administered 2016-11-26 – 2016-11-28 (×5): 3 mL via INTRAVENOUS

## 2016-11-26 MED ORDER — BISACODYL 5 MG PO TBEC: 5.0000 mg | DELAYED_RELEASE_TABLET | Freq: Every day | ORAL | Status: DC | PRN

## 2016-11-26 MED ORDER — SODIUM CHLORIDE 0.9% FLUSH: 3.0000 mL | INTRAVENOUS | Status: DC | PRN

## 2016-11-26 MED ORDER — SODIUM CHLORIDE 0.9 % IV SOLN
Freq: Once | INTRAVENOUS | Status: AC
  Administered 2016-11-26: 14:00:00 via INTRAVENOUS

## 2016-11-26 MED ORDER — LOPERAMIDE HCL 2 MG PO CAPS: 2.0000 mg | ORAL_CAPSULE | ORAL | Status: DC | PRN

## 2016-11-26 MED ORDER — ACETAMINOPHEN 650 MG RE SUPP: 650.0000 mg | Freq: Four times a day (QID) | RECTAL | Status: DC | PRN

## 2016-11-26 MED ORDER — COLESTIPOL HCL 1 G PO TABS
1.0000 g | ORAL_TABLET | Freq: Two times a day (BID) | ORAL | Status: DC
  Administered 2016-11-26 – 2016-11-28 (×5): 1 g via ORAL
  Filled 2016-11-26 (×6): qty 1

## 2016-11-26 MED ORDER — FUROSEMIDE 10 MG/ML IJ SOLN
20.0000 mg | Freq: Once | INTRAMUSCULAR | Status: AC
  Administered 2016-11-26: 13:00:00 20 mg via INTRAVENOUS
  Filled 2016-11-26: qty 2

## 2016-11-26 NOTE — ED Notes (Signed)
Pt transported to room 119 1C

## 2016-11-26 NOTE — ED Triage Notes (Addendum)
Pt arrived via POV with family with reports of anemia. Pt was seen at Dr. Gust Brooms office and found her hemoglobin to be 7.3. Pt has hx of bloody stools in March had normal COLON/EGD, but received 2 U PRBCS at that time. Pt and son report increased weakness over the past weak. Pt reports stools have been dark but patient does not take iron. Pt c/o heart racing. Pt due to see hematologist on Friday.

## 2016-11-26 NOTE — Consult Note (Signed)
Referring Provider: Dr. Bridgett Larsson Primary Care Physician:  [name], MD Primary Gastroenterologist:  Dr. Vicente Males  Reason for Consultation:  Anemia; Heme positive stool  HPI: [name] is a 83 y.o. female with a remote history of colon cancer treated with partial colon resection in the 1980's who has a history of iron deficiency anemia. This past March she had an EGD/colonoscopy and EGD showed mild erosive esophagitis and a hiatal hernia. Duodenal biopsies were negative. The colonoscopy showed a 1 cm tubular adenoma that removed from the transverse colon, left-sided diverticulosis, and internal hemorrhoids.  Has had black stools for months on iron pills. Denies abdominal pain, nausea, or vomiting. Has been feeling very weak recently per her son and daughter. No history of hematochezia. Hgb 6.7 (reportedly 7.3 on 11/22/16; 9.5 on 10/03/16). Hemoccult positive on ER evaluation. Chronic diarrhea that her son reports has improved with Colestipol. Denies NSAIDs.  Past Medical History:  Diagnosis Date  . Arthritis    back  . Benign neoplasm of skin, site unspecified   . Bowel trouble   . Cancer ([place])    colon 20 years ago  . Cancer of breast [facility]) April 10,.2013   Left breast, 3.8 cm, 4 cm axillary node, T2, N1a, ER negative PR negative, HER-2/neu not amplified.  . Hard of hearing   . Heartburn   . Hiatal hernia   . History of stomach ulcers   . Hypertension 2012  . Personal history of malignant neoplasm of breast 2013   LEFT MASTECTOMY,left modified radical mastectomy on September 06, 2011 483.8 cm primary tumor with a 4.6 cm axillary metastasis. 3 of 13 nodes were positive. T2,N1a lesion. This was a triple negative lesion  . Shingles Dec 2015  . Vaginal atrophy 08/10/2015    Past Surgical History:  Procedure Laterality Date  . ABDOMINAL HYSTERECTOMY     42 YEARS AGO  . BREAST SURGERY Left 09-06-11   left modified radical mastectomy on September 06, 2011 483.8 cm primary tumor with a 4.6 cm  axillary metastasis. 3 of 13 nodes were positive. T2,N1a lesion. This was a triple negative lesion  . CARPAL TUNNEL RELEASE Right September 25, 2014   Skip Estimable, M.D.  . COLON SURGERY  20 YEARS AGO  . COLONOSCOPY  2012   DR. ELLIOTT  . COLONOSCOPY WITH PROPOFOL N/A 08/25/2016   Procedure: COLONOSCOPY WITH PROPOFOL;  Surgeon: [name], MD;  Location: [facility] ENDOSCOPY;  Service: Gastroenterology;  Laterality: N/A;  . ESOPHAGOGASTRODUODENOSCOPY (EGD) WITH PROPOFOL N/A 08/23/2016   Procedure: ESOPHAGOGASTRODUODENOSCOPY (EGD) WITH PROPOFOL;  Surgeon: [name], MD;  Location: [facility] ENDOSCOPY;  Service: Endoscopy;  Laterality: N/A;  . MASTECTOMY Left 2013   left modified radical mastectomy on September 06, 2011 Stage 2; 3.8 cm primary tumor with a 4.6 cm axillary metastasis. 3 of 13 nodes were positive. T2,N1a lesion. This was a triple negative lesion  . UPPER GI ENDOSCOPY  2012   BLEEDING    Prior to Admission medications   Medication Sig Start Date End Date Taking? Authorizing Provider  colestipol (COLESTID) 1 g tablet Take 1 tablet by mouth 2 (two) times daily. 05/15/16  Yes [provider]  Cranberry 500 MG CAPS Take by mouth daily.   Yes [provider]  ferrous sulfate 325 (65 FE) MG tablet Take 1 tablet (325 mg total) by mouth 2 (two) times daily with a meal. 10/12/16  Yes [name], MD  gabapentin (NEURONTIN) 100 MG capsule Take on tablet each night for two  weeks. Increase to two tablets nightly if no improvement. 09/09/15  Yes Byrnett, Forest Gleason, MD  metoprolol succinate (TOPROL-XL) 25 MG 24 hr tablet Take 25 mg by mouth daily.   Yes [provider]  OXYQUINOLONE SULFATE VAGINAL 0.025 % GEL Place 1 Applicatorful vaginally 2 (two) times a week. 09/09/15  Yes Rubie Maid, MD  Potassium Gluconate 595 MG CAPS Take 1 capsule by mouth daily.    Yes [provider]  Probiotic Product (ALIGN) 4 MG CAPS Take 1 capsule by mouth daily.    Yes [provider]   ranitidine (ZANTAC) 150 MG tablet Take by mouth. 02/14/16 02/13/17 Yes [provider]  vitamin B-12 (CYANOCOBALAMIN) 1000 MCG tablet Take 1,000 mcg by mouth daily.   Yes [provider]  acetaminophen (TYLENOL) 650 MG CR tablet Take by mouth.    [provider]  estradiol (ESTRACE VAGINAL) 0.1 MG/GM vaginal cream Apply 0.55m (pea-sized amount)  just inside the vaginal introitus with a finger-tip every night for two weeks and then Monday, Wednesday and Friday nights. 07/19/15   MZara CouncilA, PA-C  loperamide (IMODIUM A-D) 2 MG tablet Take 2 mg by mouth as needed for diarrhea or loose stools.    [provider]  temazepam (RESTORIL) 15 MG capsule Take 15 mg by mouth at bedtime.    [provider]  triamcinolone (NASACORT ALLERGY 24HR) 55 MCG/ACT AERO nasal inhaler Place 2 sprays into the nose as needed.     [provider]    Scheduled Meds: . [START ON 11/27/2016] acidophilus  1 capsule Oral Daily  . colestipol  1 g Oral BID  . ferrous sulfate  325 mg Oral BID WC  . metoprolol succinate  25 mg Oral Daily  . pantoprazole (PROTONIX) IV  40 mg Intravenous Q12H  . sodium chloride flush  3 mL Intravenous Q12H   Continuous Infusions: . sodium chloride Stopped (11/26/16 1348)   PRN Meds:.acetaminophen **OR** acetaminophen, albuterol, bisacodyl, loperamide, ondansetron **OR** ondansetron (ZOFRAN) IV, senna-docusate, sodium chloride flush, temazepam  Allergies as of 11/26/2016 - Review Complete 11/26/2016  Allergen Reaction Noted  . Codeine Nausea And Vomiting 07/27/2012    Family History  Problem Relation Age of Onset  . Lung cancer Brother        colono ca  . Skin cancer Daughter   . Leukemia Father   . Colon cancer Mother   . Skin cancer Son   . Kidney disease Neg Hx   . Bladder Cancer Neg Hx     Social History   Social History  . Marital status: Widowed    Spouse name: N/A  . Number of children: N/A  . Years of  education: N/A   Occupational History  . Not on file.   Social History Main Topics  . Smoking status: Never Smoker  . Smokeless tobacco: Never Used  . Alcohol use No  . Drug use: No  . Sexual activity: No   Other Topics Concern  . Not on file   Social History Narrative  . No narrative on file    Review of Systems: All negative except as stated above in HPI.  Physical Exam: Vital signs: Vitals:   11/26/16 1353 11/26/16 1415  BP: (!) 133/54 125/63  Pulse: 80 76  Resp: 20 18  Temp: 97.7 F (36.5 C) 97.9 F (36.6 C)     General:   Elderly, Alert,  Well-nourished, hard of hearing, pleasant and cooperative in NAD Head: atraumatic, normocephalic Eyes: anicteric  sclera ENT: oropharynx clear Lungs:  Clear throughout to auscultation.   No wheezes, crackles, or rhonchi. No acute distress. Heart:  Regular rate and rhythm; no murmurs, clicks, rubs,  or gallops. Abdomen: soft, nontender, nondistended, +BS  Rectal:  Deferred Ext: no edema   GI:  Lab Results:  Recent Labs  11/26/16 0808  WBC 8.6  HGB 6.7*  HCT 21.5*  PLT 457*   BMET  Recent Labs  11/26/16 0808  NA 137  K 3.8  CL 106  CO2 19*  GLUCOSE 205*  BUN 13  CREATININE 0.74  CALCIUM 8.6*   LFT No results for input(s): PROT, ALBUMIN, AST, ALT, ALKPHOS, BILITOT, BILIDIR, IBILI in the last 72 hours. PT/INR No results for input(s): LABPROT, INR in the last 72 hours.   Studies/Results: No results found.  Impression/Plan: 83 yo woman with symptomatic anemia and heme positive stool on iron pills. EGD in March 2018 showing mild erosive esophagitis. Suspect that the worsening anemia is due to small bowel AVMs but small bowel malignancy also possible. Doubt ischemic colitis or peptic ulcer disease. I do not think a repeat EGD/colon is needed. Would avoid a capsule endoscopy because unlikely to change my management at this time. Consider an abd CT to look for any small bowel lesions if the anemia persists or  continues to occur. Agree with outpt hematology referral (already has appt scheduled for later this week with Dr. Janese Banks). Aggressive IV iron treatment likely needed but will see how Hgb responds to transfusions and may be able to defer that until hematology appt. Clear liquid diet. Dr. Vicente Males to f/u tomorrow.    LOS: 0 days   [place] C.  11/26/2016, 2:50 PM

## 2016-11-26 NOTE — Progress Notes (Signed)
Pt received 1 unit PRBC's transfused with pt stating, "I do feel better." No stools or signs of active bleeding. Tolerated clear liquid diet. Impulsive getting OOB to void especially after lasix given. Son and dgt at bedside for long intervals. Pt is HOH

## 2016-11-26 NOTE — Consult Note (Signed)
HCPOA/AD materials dropped off with patient.  CH available to review as needed. 

## 2016-11-26 NOTE — Progress Notes (Signed)
PHARMACIST - PHYSICIAN ORDER COMMUNICATION  CONCERNING: P&T Medication Policy on Herbal Medications  DESCRIPTION:  This patient's order for:  Potassium gluconate  has been noted.  This product(s) is classified as an "herbal" or natural product. Due to a lack of definitive safety studies or FDA approval, nonstandard manufacturing practices, plus the potential risk of unknown drug-drug interactions while on inpatient medications, the Pharmacy and Therapeutics Committee does not permit the use of "herbal" or natural products of this type within [place].   ACTION TAKEN: The pharmacy department is unable to verify this order at this time and your patient has been informed of this safety policy. Please reevaluate patient's clinical condition at discharge and address if the herbal or natural product(s) should be resumed at that time.  

## 2016-11-26 NOTE — ED Provider Notes (Signed)
[facility] Emergency Department Provider Note  ____________________________________________   First MD Initiated Contact with Patient 11/26/16 0848     (approximate)  I have reviewed the triage vital signs and the nursing notes.   HISTORY  Chief Complaint Anemia    HPI FRANCYS BOLIN is a 83 y.o. female here for evaluation of fatigue and weakness and low blood counts  Patient has been having dark stools for months, about 1 a day. She's had to receive a blood transfusion in the past and had an endoscopy and colonoscopy by gastroenterology for concerns of bleeding in the past. Currently takes iron supplements. He takes no blood thinners, no aspirin, no ibuprofen or NSAIDs.  She has not had any chest pain or trouble breathing. She reports feeling very tired and weak though this is been progressively worsening since Wednesday. She saw her doctor blood count was low, and advised to come for recheck.  No nausea or vomiting. No chest pain or trouble breathing. She's had previous blood transfusions. No pain at all    Past Medical History:  Diagnosis Date  . Arthritis    back  . Benign neoplasm of skin, site unspecified   . Bowel trouble   . Cancer ([place])    colon 20 years ago  . Cancer of breast [facility]) April 10,.2013   Left breast, 3.8 cm, 4 cm axillary node, T2, N1a, ER negative PR negative, HER-2/neu not amplified.  . Hard of hearing   . Heartburn   . Hiatal hernia   . History of stomach ulcers   . Hypertension 2012  . Personal history of malignant neoplasm of breast 2013   LEFT MASTECTOMY,left modified radical mastectomy on September 06, 2011 483.8 cm primary tumor with a 4.6 cm axillary metastasis. 3 of 13 nodes were positive. T2,N1a lesion. This was a triple negative lesion  . Shingles Dec 2015  . Vaginal atrophy 08/10/2015    Patient Active Problem List   Diagnosis Date Noted  . Personal history of malignant neoplasm of breast 11/09/2016  . GI  bleed 08/21/2016  . Hereditary and idiopathic peripheral neuropathy 09/09/2015  . Malignant neoplasm of left female breast ([place]) 09/09/2015  . Prolapse of vaginal wall with midline cystocele 08/10/2015  . Incomplete bladder emptying 08/10/2015  . Midline low back pain without sciatica 08/10/2015  . Fecal incontinence 08/10/2015  . Recurrent UTI 07/19/2015  . Atrophic vaginitis 07/19/2015  . Adenomatous colon polyp 02/24/2015  . Anemia 02/24/2015  . Chronic sinusitis 02/24/2015  . Diverticulosis 02/24/2015  . Esophagitis 02/24/2015  . GERD (gastroesophageal reflux disease) 02/24/2015  . Hiatal hernia 02/24/2015  . Breast cancer, left breast ([place]) 09/05/2012  . Hypertension     Past Surgical History:  Procedure Laterality Date  . ABDOMINAL HYSTERECTOMY     42 YEARS AGO  . BREAST SURGERY Left 09-06-11   left modified radical mastectomy on September 06, 2011 483.8 cm primary tumor with a 4.6 cm axillary metastasis. 3 of 13 nodes were positive. T2,N1a lesion. This was a triple negative lesion  . CARPAL TUNNEL RELEASE Right September 25, 2014   Skip Estimable, M.D.  . COLON SURGERY  20 YEARS AGO  . COLONOSCOPY  2012   DR. ELLIOTT  . COLONOSCOPY WITH PROPOFOL N/A 08/25/2016   Procedure: COLONOSCOPY WITH PROPOFOL;  Surgeon: [name], MD;  Location: [facility] ENDOSCOPY;  Service: Gastroenterology;  Laterality: N/A;  . ESOPHAGOGASTRODUODENOSCOPY (EGD) WITH PROPOFOL N/A 08/23/2016   Procedure: ESOPHAGOGASTRODUODENOSCOPY (EGD) WITH PROPOFOL;  Surgeon:  [name], MD;  Location: [facility] ENDOSCOPY;  Service: Endoscopy;  Laterality: N/A;  . MASTECTOMY Left 2013   left modified radical mastectomy on September 06, 2011 Stage 2; 3.8 cm primary tumor with a 4.6 cm axillary metastasis. 3 of 13 nodes were positive. T2,N1a lesion. This was a triple negative lesion  . UPPER GI ENDOSCOPY  2012   BLEEDING    Prior to Admission medications   Medication Sig Start Date End Date Taking? Authorizing Provider  acetaminophen  (TYLENOL) 650 MG CR tablet Take by mouth.    [provider]  ciprofloxacin (CIPRO) 500 MG tablet Take 1 tablet (500 mg total) by mouth 2 (two) times daily. 11/15/16   Rubie Maid, MD  colestipol (COLESTID) 1 g tablet Take 1 tablet by mouth 2 (two) times daily. 05/15/16   [provider]  Cranberry 500 MG CAPS Take by mouth daily.    [provider]  estradiol (ESTRACE VAGINAL) 0.1 MG/GM vaginal cream Apply 0.74m (pea-sized amount)  just inside the vaginal introitus with a finger-tip every night for two weeks and then Monday, Wednesday and Friday nights. 07/19/15   MZara CouncilA, PA-C  ferrous sulfate 325 (65 FE) MG tablet Take 1 tablet (325 mg total) by mouth 2 (two) times daily with a meal. 10/12/16   AJonathon Bellows MD  gabapentin (NEURONTIN) 100 MG capsule Take on tablet each night for two weeks. Increase to two tablets nightly if no improvement. 09/09/15   BRobert Bellow MD  loperamide (IMODIUM A-D) 2 MG tablet Take 2 mg by mouth as needed for diarrhea or loose stools.    [provider]  metoprolol succinate (TOPROL-XL) 25 MG 24 hr tablet Take 25 mg by mouth daily.    [provider]  OXYQUINOLONE SULFATE VAGINAL 0.025 % GEL Place 1 Applicatorful vaginally 2 (two) times a week. 09/09/15   CRubie Maid MD  Potassium 99 MG TABS Take by mouth.    [provider]  Potassium Gluconate 595 MG CAPS Take 1 capsule by mouth daily.     [provider]  Probiotic Product (ALIGN) 4 MG CAPS Take 1 capsule by mouth daily.     [provider]  ranitidine (ZANTAC) 150 MG tablet Take by mouth. 02/14/16 02/13/17  [provider]  temazepam (RESTORIL) 15 MG capsule Take 15 mg by mouth at bedtime.    [provider]  triamcinolone (NASACORT ALLERGY 24HR) 55 MCG/ACT AERO nasal inhaler Place 2 sprays into the nose as needed.     [provider]  vitamin B-12 (CYANOCOBALAMIN) 1000 MCG tablet Take 1,000 mcg by mouth  daily.    [provider]    Allergies Codeine  Family History  Problem Relation Age of Onset  . Lung cancer Brother        colono ca  . Skin cancer Daughter   . Leukemia Father   . Colon cancer Mother   . Skin cancer Son   . Kidney disease Neg Hx   . Bladder Cancer Neg Hx     Social History Social History  Substance Use Topics  . Smoking status: Never Smoker  . Smokeless tobacco: Never Used  . Alcohol use No    Review of Systems Constitutional: No fever/chillsGenerally tired Eyes: No visual changes. ENT: No sore throat. Cardiovascular: Denies chest pain. Respiratory: Denies shortness of breath. Gastrointestinal: No abdominal pain.  No nausea, no vomiting.  No diarrhea.  No constipation.Dark stools Genitourinary: Negative for dysuria. Musculoskeletal: Negative for  back pain. Skin: Negative for rash. Neurological: Negative for headaches, focal weakness or numbness.    ____________________________________________   PHYSICAL EXAM:  VITAL SIGNS: ED Triage Vitals  Enc Vitals Group     BP 11/26/16 0800 (!) 147/58     Pulse Rate 11/26/16 0800 91     Resp 11/26/16 0800 (!) 22     Temp 11/26/16 0800 97.7 F (36.5 C)     Temp Source 11/26/16 0800 Oral     SpO2 11/26/16 0800 99 %     Weight 11/26/16 0800 152 lb (68.9 kg)     Height 11/26/16 0800 _0  (1.626 m)     Head Circumference --      Peak Flow --      Pain Score 11/26/16 0755 0     Pain Loc --      Pain Edu? --      Excl. in [place]? --     Constitutional: Alert and oriented. Well appearing and in no acute distress.Patient and her son are both very pleasant Eyes: Conjunctivae are normal. Head: Atraumatic. Nose: No congestion/rhinnorhea. Mouth/Throat: Mucous membranes are moist. Neck: No stridor.   Cardiovascular: Normal rate, regular rhythm. Grossly normal heart sounds.  Good peripheral circulation. Respiratory: Normal respiratory effort.  No retractions. Lungs CTAB. Gastrointestinal: Soft and  nontender. No distention. Slightly dark heme positive stool Musculoskeletal: No lower extremity tenderness nor edema. Neurologic:  Normal speech and language. No gross focal neurologic deficits are appreciated.  Skin:  Skin is warm, dry and intact. No rash noted. Psychiatric: Mood and affect are normal. Speech and behavior are normal.  ____________________________________________   LABS (all labs ordered are listed, but only abnormal results are displayed)  Labs Reviewed  BASIC METABOLIC PANEL - Abnormal; Notable for the following:       Result Value   CO2 19 (*)    Glucose, Bld 205 (*)    Calcium 8.6 (*)    All other components within normal limits  CBC - Abnormal; Notable for the following:    RBC 2.97 (*)    Hemoglobin 6.7 (*)    HCT 21.5 (*)    MCV 72.4 (*)    MCH 22.5 (*)    MCHC 31.0 (*)    RDW 21.8 (*)    Platelets 457 (*)    All other components within normal limits  URINALYSIS, COMPLETE (UACMP) WITH MICROSCOPIC  TYPE AND SCREEN   ____________________________________________  EKG  Reviewed interim me at 8 AM Ventricular rate 90 Care is 89 QTc 440 Normal sinus rhythm, no evidence of ischemia ____________________________________________  RADIOLOGY  No indication for CT imaging at this time. No pain on exam. Denies abdominal discomfort or pain. ____________________________________________   PROCEDURES  Procedure(s) performed: None  Procedures  Critical Care performed: No  ____________________________________________   INITIAL IMPRESSION / ASSESSMENT AND PLAN / ED COURSE  Pertinent labs & imaging results that were available during my care of the patient were reviewed by me and considered in my medical decision making (see chart for details).  Patient presents for dark stools, fatigue. Workup in the past does not reveal the source, but today she does have heme positive stool. She is hemodynamically stable and in no distress. Believe she would benefit  from type specific blood and careful monitoring. No indication for emergency blood transfusion with non-type specific blood. I will continue to monitor closely and admitted her to the hospitalist service.  Patient and son agreeable with plan for admission  ____________________________________________   FINAL CLINICAL IMPRESSION(S) / ED DIAGNOSES  Final diagnoses:  Gastrointestinal hemorrhage, unspecified gastrointestinal hemorrhage type  Anemia, unspecified type      NEW MEDICATIONS STARTED DURING THIS VISIT:  New Prescriptions   No medications on file     Note:  This document was prepared using Dragon voice recognition software and may include unintentional dictation errors.     Delman Kitten, MD 11/26/16 [phone]

## 2016-11-26 NOTE — H&P (Signed)
[facility] [place] NAME: [name]    MR#:  [id number]  DATE OF BIRTH:  [birth year]  DATE OF ADMISSION:  11/26/2016  PRIMARY CARE PHYSICIAN: [name], MD   REQUESTING/REFERRING PHYSICIAN: Delman Kitten, MD  CHIEF COMPLAINT:   Chief Complaint  Patient presents with  . Anemia   Weakness and shortness breath several days. HISTORY OF PRESENT ILLNESS:  [name]  is a 83 y.o. female with a known history of Colon cancer, breast cancer, anemia, UTI, arthritis, hypertension and shingles. The patient has had dark stool for months. She had worsening weakness and shortness breath for the past a few days. She had endoscopy and colonoscopy for concerns of bleeding in the past. She also had a blood transfusion for anemia in the past. She denies any easy bruising or bloody stool. She was found to hemoglobin 6.7.  PAST MEDICAL HISTORY:   Past Medical History:  Diagnosis Date  . Arthritis    back  . Benign neoplasm of skin, site unspecified   . Bowel trouble   . Cancer ([place])    colon 20 years ago  . Cancer of breast [facility]) April 10,.2013   Left breast, 3.8 cm, 4 cm axillary node, T2, N1a, ER negative PR negative, HER-2/neu not amplified.  . Hard of hearing   . Heartburn   . Hiatal hernia   . History of stomach ulcers   . Hypertension 2012  . Personal history of malignant neoplasm of breast 2013   LEFT MASTECTOMY,left modified radical mastectomy on September 06, 2011 483.8 cm primary tumor with a 4.6 cm axillary metastasis. 3 of 13 nodes were positive. T2,N1a lesion. This was a triple negative lesion  . Shingles Dec 2015  . Vaginal atrophy 08/10/2015    PAST SURGICAL HISTORY:   Past Surgical History:  Procedure Laterality Date  . ABDOMINAL HYSTERECTOMY     42 YEARS AGO  . BREAST SURGERY Left 09-06-11   left modified radical mastectomy on September 06, 2011 483.8 cm primary tumor with a 4.6 cm axillary metastasis. 3 of 13 nodes were  positive. T2,N1a lesion. This was a triple negative lesion  . CARPAL TUNNEL RELEASE Right September 25, 2014   Skip Estimable, M.D.  . COLON SURGERY  20 YEARS AGO  . COLONOSCOPY  2012   DR. ELLIOTT  . COLONOSCOPY WITH PROPOFOL N/A 08/25/2016   Procedure: COLONOSCOPY WITH PROPOFOL;  Surgeon: [name], MD;  Location: [facility] ENDOSCOPY;  Service: Gastroenterology;  Laterality: N/A;  . ESOPHAGOGASTRODUODENOSCOPY (EGD) WITH PROPOFOL N/A 08/23/2016   Procedure: ESOPHAGOGASTRODUODENOSCOPY (EGD) WITH PROPOFOL;  Surgeon: [name], MD;  Location: [facility] ENDOSCOPY;  Service: Endoscopy;  Laterality: N/A;  . MASTECTOMY Left 2013   left modified radical mastectomy on September 06, 2011 Stage 2; 3.8 cm primary tumor with a 4.6 cm axillary metastasis. 3 of 13 nodes were positive. T2,N1a lesion. This was a triple negative lesion  . UPPER GI ENDOSCOPY  2012   BLEEDING    SOCIAL HISTORY:   Social History  Substance Use Topics  . Smoking status: Never Smoker  . Smokeless tobacco: Never Used  . Alcohol use No    FAMILY HISTORY:   Family History  Problem Relation Age of Onset  . Lung cancer Brother        colono ca  . Skin cancer Daughter   . Leukemia Father   . Colon cancer Mother   . Skin cancer Son   . Kidney disease  Neg Hx   . Bladder Cancer Neg Hx     DRUG ALLERGIES:   Allergies  Allergen Reactions  . Codeine Nausea And Vomiting    REVIEW OF SYSTEMS:   Review of Systems  Constitutional: Positive for malaise/fatigue. Negative for chills and fever.  HENT: Positive for hearing loss. Negative for sore throat.   Eyes: Negative for blurred vision and double vision.  Respiratory: Positive for shortness of breath. Negative for cough, hemoptysis, wheezing and stridor.   Cardiovascular: Positive for leg swelling. Negative for chest pain.  Gastrointestinal: Positive for melena. Negative for abdominal pain, blood in stool, diarrhea, nausea and vomiting.  Genitourinary: Negative for dysuria and  hematuria.  Musculoskeletal: Negative for back pain.  Skin: Negative for itching and rash.  Neurological: Positive for weakness. Negative for dizziness, focal weakness, loss of consciousness and headaches.  Endo/Heme/Allergies: Does not bruise/bleed easily.  Psychiatric/Behavioral: Negative for depression. The patient is not nervous/anxious.     MEDICATIONS AT HOME:   Prior to Admission medications   Medication Sig Start Date End Date Taking? Authorizing Provider  colestipol (COLESTID) 1 g tablet Take 1 tablet by mouth 2 (two) times daily. 05/15/16  Yes [provider]  Cranberry 500 MG CAPS Take by mouth daily.   Yes [provider]  ferrous sulfate 325 (65 FE) MG tablet Take 1 tablet (325 mg total) by mouth 2 (two) times daily with a meal. 10/12/16  Yes [name], MD  gabapentin (NEURONTIN) 100 MG capsule Take on tablet each night for two weeks. Increase to two tablets nightly if no improvement. 09/09/15  Yes Byrnett, Forest Gleason, MD  metoprolol succinate (TOPROL-XL) 25 MG 24 hr tablet Take 25 mg by mouth daily.   Yes [provider]  OXYQUINOLONE SULFATE VAGINAL 0.025 % GEL Place 1 Applicatorful vaginally 2 (two) times a week. 09/09/15  Yes Rubie Maid, MD  Potassium Gluconate 595 MG CAPS Take 1 capsule by mouth daily.    Yes [provider]  Probiotic Product (ALIGN) 4 MG CAPS Take 1 capsule by mouth daily.    Yes [provider]  ranitidine (ZANTAC) 150 MG tablet Take by mouth. 02/14/16 02/13/17 Yes [provider]  vitamin B-12 (CYANOCOBALAMIN) 1000 MCG tablet Take 1,000 mcg by mouth daily.   Yes [provider]  acetaminophen (TYLENOL) 650 MG CR tablet Take by mouth.    [provider]  estradiol (ESTRACE VAGINAL) 0.1 MG/GM vaginal cream Apply 0.84m (pea-sized amount)  just inside the vaginal introitus with a finger-tip every night for two weeks and then Monday, Wednesday and Friday nights. 07/19/15   MZara Council A, PA-C  loperamide (IMODIUM A-D) 2 MG tablet Take 2 mg by mouth as needed for diarrhea or loose stools.    [provider]  temazepam (RESTORIL) 15 MG capsule Take 15 mg by mouth at bedtime.    [provider]  triamcinolone (NASACORT ALLERGY 24HR) 55 MCG/ACT AERO nasal inhaler Place 2 sprays into the nose as needed.     [provider]      VITAL SIGNS:  Blood pressure (!) 148/72, pulse 75, temperature 97.7 F (36.5 C), temperature source Oral, resp. rate 18, height '5\' 4"'  (1.626 m), weight 152 lb (68.9 kg), SpO2 99 %.  PHYSICAL EXAMINATION:  Physical Exam  GENERAL:  83y.o.-year-old patient lying in the bed with no acute distress.  EYES: Pupils equal, round, reactive to light and accommodation. No scleral icterus. Extraocular muscles intact.  HEENT: Head atraumatic, normocephalic.  Oropharynx and nasopharynx clear.  NECK:  Supple, no jugular venous distention. No thyroid enlargement, no tenderness.  LUNGS: Normal breath sounds bilaterally, no wheezing, rales,rhonchi or crepitation. No use of accessory muscles of respiration.  CARDIOVASCULAR: S1, S2 normal. No murmurs, rubs, or gallops.  ABDOMEN: Soft, nontender, nondistended. Bowel sounds present. No organomegaly or mass.  EXTREMITIES: No pedal edema, cyanosis, or clubbing.  NEUROLOGIC: Cranial nerves II through XII are intact. Muscle strength 5/5 in all extremities. Sensation intact. Gait not checked.  PSYCHIATRIC: The patient is alert and oriented x 3.  SKIN: No obvious rash, lesion, or ulcer.   LABORATORY PANEL:   CBC  Recent Labs Lab 11/26/16 0808  WBC 8.6  HGB 6.7*  HCT 21.5*  PLT 457*   ------------------------------------------------------------------------------------------------------------------  Chemistries   Recent Labs Lab 11/26/16 0808  NA 137  K 3.8  CL 106  CO2 19*  GLUCOSE 205*  BUN 13  CREATININE 0.74  CALCIUM 8.6*    ------------------------------------------------------------------------------------------------------------------  Cardiac Enzymes No results for input(s): TROPONINI in the last 168 hours. ------------------------------------------------------------------------------------------------------------------  RADIOLOGY:  No results found.    IMPRESSION AND PLAN:   Symptomatic anemia due to GI bleeding. The patient will be admitted to medical floor. Type and cross, PRBC transfusion for now, follow-up hemoglobin after transfusion. Protonix IV twice a day, GI consult from Dr. Vicente Males. Clear liquid diet for now and nothing by mouth after midnight.  Hypertension. Continue Lopressor.  All the records are reviewed and case discussed with ED provider. Management plans discussed with the patient, her son and daughter and they are in agreement.  CODE STATUS: DO NOT RESUSCITATE  TOTAL TIME TAKING CARE OF THIS PATIENT: 55 minutes.    Demetrios Loll M.D on 11/26/2016 at 11:14 AM  Between 7am to 6pm - Pager - [phone]  After 6pm go to [email or web address] - Proofreader  [facility]  Office  [phone]  CC: Primary care physician; [name], MD   Note: This dictation was prepared with Dragon dictation along with smaller phrase technology. Any transcriptional errors that result from this process are unintentional.

## 2016-11-26 NOTE — Progress Notes (Signed)
Pt with son and dgt educated on no diaper use since pt wears depends. Educated on skin moisture and breakdown. Family states they will provide pt's supplies.

## 2016-11-27 DIAGNOSIS — K922 Gastrointestinal hemorrhage, unspecified: Secondary | ICD-10-CM | POA: Diagnosis not present

## 2016-11-27 LAB — BASIC METABOLIC PANEL
Anion gap: 6 (ref 5–15)
BUN: 9 mg/dL (ref 6–20)
CO2: 25 mmol/L (ref 22–32)
CREATININE: 0.63 mg/dL (ref 0.44–1.00)
Calcium: 8 mg/dL — ABNORMAL LOW (ref 8.9–10.3)
Chloride: 107 mmol/L (ref 101–111)
GFR calc Af Amer: >60 mL/min (ref >60)
GFR calc non Af Amer: >60 mL/min (ref >60)
Glucose, Bld: 95 mg/dL (ref 65–99)
Potassium: 3.7 mmol/L (ref 3.5–5.1)
SODIUM: 138 mmol/L (ref 135–145)

## 2016-11-27 MED ORDER — ENSURE ENLIVE PO LIQD
237.0000 mL | ORAL | Status: DC
  Administered 2016-11-28: 11:00:00 237 mL via ORAL

## 2016-11-27 MED ORDER — SODIUM CHLORIDE 0.9 % IV SOLN
300.0000 mg | Freq: Once | INTRAVENOUS | Status: AC
  Administered 2016-11-27: 300 mg via INTRAVENOUS
  Filled 2016-11-27: qty 15

## 2016-11-27 NOTE — Progress Notes (Signed)
Initial Nutrition Assessment  DOCUMENTATION CODES:   Not applicable  INTERVENTION:  Provide Ensure Enlive po once daily, each supplement provides 350 kcal and 20 grams of protein.   Provide snacks po TID between meals.  NUTRITION DIAGNOSIS:   Inadequate oral intake related to poor appetite as evidenced by per patient/family report.  GOAL:   Patient will meet greater than or equal to 90% of their needs  MONITOR:   PO intake, Supplement acceptance, Labs, Weight trends, I & O's  REASON FOR ASSESSMENT:   Malnutrition Screening Tool    ASSESSMENT:   83 year old female with PMHx of HTN, hx of breast cancer s/p left mastectomy in 2013, hiatal hernia, remote hx of colon cancer s/p partial colon resection in 1980's who presented with symptomatic anemia due to possible GI bleed.   Spoke with patient and family members at bedside. Patient reports she has had a poor appetite for about one month now. She still eats all her meals and snacks, though. Family reports she will eat small meals throughout the day. Enjoys bananas, yogurt, ice cream. She drinks one milk chocolate Ensure per day. Patient reports she has a hx of difficulty swallowing related to reflux but it still able to eat regular texture diet and thin liquids. Denies any N/V or abdominal pain.  She reports she has not really lost weight yet. Reports she usually weights 155 lbs. She has lost approximately 1.5 lbs (1% body weight) over the past month, which is not significant for time frame.  Meal Completion: 25-100% on CLD, 70% of lunch today on regular diet per chart  Medications reviewed and include: acidophilus 1 capsule daily, colestipol 1 gram BID, ferrous sulfate 325 mg BID, pantoprazole.  Labs reviewed: Hgb 7.9, Hct 25.1.   Nutrition-Focused physical exam completed. Findings are mild-moderate fat depletion (in upper arm region), mild-moderate muscle depletion, and moderate edema.   Patient does not meet criteria for  malnutrition at this time.  Diet Order:  Diet regular Room service appropriate? Yes; Fluid consistency: Thin  Skin:  Reviewed, no issues  Last BM:  11/27/2016 - type 5  Height:   Ht Readings from Last 1 Encounters:  11/26/16 5\' 5"  (1.651 m)    Weight:   Wt Readings from Last 1 Encounters:  11/26/16 153 lb 8 oz (69.6 kg)    Ideal Body Weight:  56.8 kg  BMI:  Body mass index is 25.54 kg/m.  Estimated Nutritional Needs:   Kcal:  1330-1555 (MSJ x 1.2-1.4)  Protein:  70-85 grams (1-1.2 grams/kg)  Fluid:  1.7 L/day (25 ml/kg)  EDUCATION NEEDS:   No education needs identified at this time  Willey Blade, MS, RD, LDN Pager: [phone] After Hours Pager: [phone]

## 2016-11-27 NOTE — Progress Notes (Signed)
   Sandra Bellows MD, MRCP(U.K) [name]  [name], [name] [id number]  Main: [phone]    [name] is being followed for Obscure GI bleed  Day 1 of follow up   Subjective: No blood in stools feels well , wants to eat    Objective: Vital signs in last 24 hours: Vitals:   11/26/16 1415 11/26/16 1740 11/26/16 2040 11/27/16 0445  BP: 125/63 (!) 154/71 (!) 152/79 (!) 147/65  Pulse: 76 85 72 76  Resp: 18 20 20 20   Temp: 97.9 F (36.6 C) 97.6 F (36.4 C) 98.5 F (36.9 C) 98.5 F (36.9 C)  TempSrc: Oral Oral Oral Oral  SpO2: 99% 96% 98% 96%  Weight:      Height:       Weight change:   Intake/Output Summary (Last 24 hours) at 11/27/16 1139 Last data filed at 11/27/16 1011  Gross per 24 hour  Intake             2423 ml  Output             2400 ml  Net               23 ml     Exam: Heart:: Regular rate and rhythm, S1S2 present or without murmur or extra heart sounds Lungs: normal, clear to auscultation and clear to auscultation and percussion Abdomen: soft, nontender, normal bowel sounds   Lab Results: @LABTEST2 @ Micro Results: No results found for this or any previous visit (from the past 240 hour(s)). Studies/Results: No results found. Medications: I have reviewed the patient's current medications. Scheduled Meds: . acidophilus  1 capsule Oral Daily  . colestipol  1 g Oral BID  . ferrous sulfate  325 mg Oral BID WC  . gabapentin  100 mg Oral TID  . metoprolol succinate  25 mg Oral Daily  . pantoprazole (PROTONIX) IV  40 mg Intravenous Q12H  . sodium chloride flush  3 mL Intravenous Q12H   Continuous Infusions: . sodium chloride 50 mL/hr at 11/27/16 1006   PRN Meds:.acetaminophen **OR** acetaminophen, albuterol, bisacodyl, loperamide, ondansetron **OR** ondansetron (ZOFRAN) IV, senna-docusate, sodium chloride flush, temazepam   Assessment: Active Problems:   Anemia    Plan: 1. No signs of overt bleeding  2. Advance diet  3.  IV iron prior to discharge  4. PPI BID for 6 weeks 5. F/u with me in 2 weeks  6. Can go home if CBC stable tomorrow  7. Keep appointment with Hematology on Friday 8. If she has signs of overt bleeding then needs Tagged RBC scan    LOS: 1 day   [name] 11/27/2016, 11:39 AM

## 2016-11-27 NOTE — Progress Notes (Signed)
[facility] - [place] at [facility]                                                                                                                                                                                  Patient Demographics   [name], is a 83 y.o. female, DOB - 03-12-1921, LZJ:[id number]  Admit date - 11/26/2016   Admitting Physician Demetrios Loll, MD  Outpatient Primary MD for the patient is [name], MD   LOS - 1  Subjective:  Patient feeling much better,s/p transfuion no further bleeding noted   Review of Systems:   CONSTITUTIONAL: No documented fever. No fatigue, weakness. No weight gain, no weight loss.  EYES: No blurry or double vision.  ENT: No tinnitus. No postnasal drip. No redness of the oropharynx.  RESPIRATORY: No cough, no wheeze, no hemoptysis. No dyspnea.  CARDIOVASCULAR: No chest pain. No orthopnea. No palpitations. No syncope.  GASTROINTESTINAL: No nausea, no vomiting or diarrhea. No abdominal pain. No melena or hematochezia.  GENITOURINARY: No dysuria or hematuria.  ENDOCRINE: No polyuria or nocturia. No heat or cold intolerance.  HEMATOLOGY: No anemia. No bruising. No bleeding.  INTEGUMENTARY: No rashes. No lesions.  MUSCULOSKELETAL: No arthritis. No swelling. No gout.  NEUROLOGIC: No numbness, tingling, or ataxia. No seizure-type activity.  PSYCHIATRIC: No anxiety. No insomnia. No ADD.    Vitals:   Vitals:   11/26/16 1740 11/26/16 2040 11/27/16 0445 11/27/16 1250  BP: (!) 154/71 (!) 152/79 (!) 147/65 (!) 129/59  Pulse: 85 72 76 73  Resp: 20 20 20 20   Temp: 97.6 F (36.4 C) 98.5 F (36.9 C) 98.5 F (36.9 C) 98.5 F (36.9 C)  TempSrc: Oral Oral Oral Oral  SpO2: 96% 98% 96% 97%  Weight:      Height:        Wt Readings from Last 3 Encounters:  11/26/16 153 lb 8 oz (69.6 kg)  11/15/16 152 lb 4.8 oz (69.1 kg)  11/08/16 153 lb (69.4 kg)     Intake/Output Summary (Last 24 hours) at 11/27/16 1303 Last data filed at  11/27/16 1241  Gross per 24 hour  Intake          2539.67 ml  Output             2525 ml  Net            14.67 ml    Physical Exam:   GENERAL: Pleasant-appearing in no apparent distress.  HEAD, EYES, EARS, NOSE AND THROAT: Atraumatic, normocephalic. Extraocular muscles are intact. Pupils equal and reactive to light. Sclerae anicteric. No conjunctival injection. No oro-pharyngeal erythema.  NECK: Supple. There is no jugular venous  distention. No bruits, no lymphadenopathy, no thyromegaly.  HEART: Regular rate and rhythm,. No murmurs, no rubs, no clicks.  LUNGS: Clear to auscultation bilaterally. No rales or rhonchi. No wheezes.  ABDOMEN: Soft, flat, nontender, nondistended. Has good bowel sounds. No hepatosplenomegaly appreciated.  EXTREMITIES: No evidence of any cyanosis, clubbing, or peripheral edema.  +2 pedal and radial pulses bilaterally.  NEUROLOGIC: The patient is alert, awake, and oriented x3 with no focal motor or sensory deficits appreciated bilaterally.  SKIN: Moist and warm with no rashes appreciated.  Psych: Not anxious, depressed LN: No inguinal LN enlargement    Antibiotics   Anti-infectives    None      Medications   Scheduled Meds: . acidophilus  1 capsule Oral Daily  . colestipol  1 g Oral BID  . ferrous sulfate  325 mg Oral BID WC  . gabapentin  100 mg Oral TID  . metoprolol succinate  25 mg Oral Daily  . pantoprazole (PROTONIX) IV  40 mg Intravenous Q12H  . sodium chloride flush  3 mL Intravenous Q12H   Continuous Infusions: . iron sucrose     PRN Meds:.acetaminophen **OR** acetaminophen, albuterol, bisacodyl, loperamide, ondansetron **OR** ondansetron (ZOFRAN) IV, senna-docusate, sodium chloride flush, temazepam   Data Review:   Micro Results No results found for this or any previous visit (from the past 240 hour(s)).  Radiology Reports No results found.   CBC  Recent Labs Lab 11/26/16 0808 11/26/16 1940  WBC 8.6  --   HGB 6.7* 7.9*   HCT 21.5* 25.1*  PLT 457*  --   MCV 72.4*  --   MCH 22.5*  --   MCHC 31.0*  --   RDW 21.8*  --     Chemistries   Recent Labs Lab 11/26/16 0808 11/27/16 0526  NA 137 138  K 3.8 3.7  CL 106 107  CO2 19* 25  GLUCOSE 205* 95  BUN 13 9  CREATININE 0.74 0.63  CALCIUM 8.6* 8.0*   ------------------------------------------------------------------------------------------------------------------ estimated creatinine clearance is 43 mL/min (by C-G formula based on SCr of 0.63 mg/dL). ------------------------------------------------------------------------------------------------------------------ No results for input(s): HGBA1C in the last 72 hours. ------------------------------------------------------------------------------------------------------------------ No results for input(s): CHOL, HDL, LDLCALC, TRIG, CHOLHDL, LDLDIRECT in the last 72 hours. ------------------------------------------------------------------------------------------------------------------ No results for input(s): TSH, T4TOTAL, T3FREE, THYROIDAB in the last 72 hours.  Invalid input(s): FREET3 ------------------------------------------------------------------------------------------------------------------ No results for input(s): VITAMINB12, FOLATE, FERRITIN, TIBC, IRON, RETICCTPCT in the last 72 hours.  Coagulation profile No results for input(s): INR, PROTIME in the last 168 hours.  No results for input(s): DDIMER in the last 72 hours.  Cardiac Enzymes No results for input(s): CKMB, TROPONINI, MYOGLOBIN in the last 168 hours.  Invalid input(s): CK ------------------------------------------------------------------------------------------------------------------ Invalid input(s): [place]  Patient is a 83 year old presented with symptomatic anemia   1.Symptomatic anemia due to possible GI bleeding. Patient's hemoglobin is stable status post transfusion Per GI no plan for  colonoscopy or endoscopy for time being We will give a dose of IV iron  2.Hypertension. Continue Lopressor.  3. GERD continue PPIs  4. Neuropathy continue gabapentin  5. Miscellaneous SCDs for DVT prophylaxis      Code Status Orders        Start     Ordered   11/26/16 1202  Do not attempt resuscitation (DNR)  Continuous    Question Answer Comment  In the event of cardiac or respiratory ARREST Do not call a "code blue"   In the event of cardiac  or respiratory ARREST Do not perform Intubation, CPR, defibrillation or ACLS   In the event of cardiac or respiratory ARREST Use medication by any route, position, wound care, and other measures to relive pain and suffering. May use oxygen, suction and manual treatment of airway obstruction as needed for comfort.      11/26/16 1201    Code Status History    Date Active Date Inactive Code Status Order ID Comments User Context   08/21/2016  6:24 PM 08/25/2016  6:57 PM Full Code [id number]  Epifanio Lesches, MD ED    Advance Directive Documentation     Most Recent Value  Type of Advance Directive  Living will  Pre-existing out of facility DNR order (yellow form or pink MOST form)  -  "MOST" Form in Place?  -           Consults gi  DVT Prophylaxis scd's  Lab Results  Component Value Date   PLT 457 (H) 11/26/2016     Time Spent in minutes  10min  Greater than 50% of time spent in care coordination and counseling patient regarding the condition and plan of care.   Dustin Flock M.D on 11/27/2016 at 1:03 PM  Between 7am to 6pm - Pager - [phone]  After 6pm go to [email or web address] - password [email or web address] [facility]   Office  [phone]

## 2016-11-28 LAB — CBC
HEMATOCRIT: 24.4 % — AB (ref 35.0–47.0)
Hemoglobin: 7.6 g/dL — ABNORMAL LOW (ref 12.0–16.0)
MCH: 23 pg — ABNORMAL LOW (ref 26.0–34.0)
MCHC: 31.1 g/dL — ABNORMAL LOW (ref 32.0–36.0)
MCV: 73.8 fL — AB (ref 80.0–100.0)
Platelets: 359 10*3/uL (ref 150–440)
RBC: 3.31 MIL/uL — AB (ref 3.80–5.20)
RDW: 24.5 % — AB (ref 11.5–14.5)
WBC: 8.2 10*3/uL (ref 3.6–11.0)

## 2016-11-28 MED ORDER — LANSOPRAZOLE 30 MG PO CPDR: 30.0000 mg | DELAYED_RELEASE_CAPSULE | Freq: Every day | ORAL | 1 refills | Status: DC

## 2016-11-28 NOTE — Discharge Instructions (Signed)
[facility] - [place] at [facility] ° °DIET:  °Regular diet ° °DISCHARGE CONDITION:  °Stable ° °ACTIVITY:  °Activity as tolerated ° °OXYGEN:  °Home Oxygen: No. °  °Oxygen Delivery: room air ° °DISCHARGE LOCATION:  °home  ° ° °ADDITIONAL DISCHARGE INSTRUCTION: ° ° °If you experience worsening of your admission symptoms, develop shortness of breath, life threatening emergency, suicidal or homicidal thoughts you must seek medical attention immediately by calling 911 or calling your MD immediately  if symptoms less severe. ° °You Must read complete instructions/literature along with all the possible adverse reactions/side effects for all the Medicines you take and that have been prescribed to you. Take any new Medicines after you have completely understood and accpet all the possible adverse reactions/side effects.  ° °Please note ° °You were cared for by a hospitalist during your hospital stay. If you have any questions about your discharge medications or the care you received while you were in the hospital after you are discharged, you can call the unit and asked to speak with the hospitalist on call if the hospitalist that took care of you is not available. Once you are discharged, your primary care physician will handle any further medical issues. Please note that NO REFILLS for any discharge medications will be authorized once you are discharged, as it is imperative that you return to your primary care physician (or establish a relationship with a primary care physician if you do not have one) for your aftercare needs so that they can reassess your need for medications and monitor your lab values. ° ° °

## 2016-11-28 NOTE — Discharge Summary (Signed)
[facility] - [place] at [facility], 83 y.o., DOB 18-May-1921, MRN [id number]. Admission date: 11/26/2016 Discharge Date 11/28/2016 Primary MD [name], MD Admitting Physician Demetrios Loll, MD  Admission Diagnosis  Gastrointestinal hemorrhage, unspecified gastrointestinal hemorrhage type [K92.2] Anemia, unspecified type [D64.9]  Discharge Diagnosis   Active Problems:  Symptomatic Anemia  Dark tarry stools  Osteoarthritis History of breast cancer  Essential hypertension        Hospital Course   CHRYSTIE HAGWOOD is a 83 y.o. female with a remote history of colon cancer treated with partial colon resection in the 1980's who has a history of iron deficiency anemia. This past March she had an EGD/colonoscopy and EGD showed mild erosive esophagitis and a hiatal hernia. Duodenal biopsies were negative. The colonoscopy showed a 1 cm tubular adenoma that removed from the transverse colon, left-sided diverticulosis, and internal hemorrhoids.  Has had black stools for months on iron pills. Denies abdominal pain, nausea, or vomiting. Has been feeling very weak recently per her son and daughter. No history of hematochezia. Hgb 6.7 was noted in the emergency room therefore she was admitted for further evaluation. Patient was seen in consultation by GI due to there were no overt sign of bleeding colonoscopy and EGD which were recently done and were not repeated. Patient was transfused. Hemoglobin is around 7.6. She did receive dose of IV iron. She does follow-up with hematology service Friday at that time CBC needs to be checked transfusion needs to be provided if if needed. If she continues to have dark color stools and anemia may need a capsule endoscopy. Patient today is doing well  denies any complaints         Consults  GI  Significant Tests:  See full reports for all details     No results found.     Today   Subjective:   Maire Govan patient feeling  better denies any complaints  Objective:   Blood pressure 137/64, pulse 77, temperature 98.3 F (36.8 C), temperature source Oral, resp. rate 18, height 5\' 5"  (1.651 m), weight 153 lb 8 oz (69.6 kg), SpO2 95 %.  .  Intake/Output Summary (Last 24 hours) at 11/28/16 1209 Last data filed at 11/28/16 0900  Gross per 24 hour  Intake           861.67 ml  Output             1525 ml  Net          -663.33 ml    Exam VITAL SIGNS: Blood pressure 137/64, pulse 77, temperature 98.3 F (36.8 C), temperature source Oral, resp. rate 18, height 5\' 5"  (1.651 m), weight 153 lb 8 oz (69.6 kg), SpO2 95 %.  GENERAL:  83 y.o.-year-old patient lying in the bed with no acute distress.  EYES: Pupils equal, round, reactive to light and accommodation. No scleral icterus. Extraocular muscles intact.  HEENT: Head atraumatic, normocephalic. Oropharynx and nasopharynx clear.  NECK:  Supple, no jugular venous distention. No thyroid enlargement, no tenderness.  LUNGS: Normal breath sounds bilaterally, no wheezing, rales,rhonchi or crepitation. No use of accessory muscles of respiration.  CARDIOVASCULAR: S1, S2 normal. No murmurs, rubs, or gallops.  ABDOMEN: Soft, nontender, nondistended. Bowel sounds present. No organomegaly or mass.  EXTREMITIES: No pedal edema, cyanosis, or clubbing.  NEUROLOGIC: Cranial nerves II through XII are intact. Muscle strength 5/5 in all extremities. Sensation intact. Gait not checked.  PSYCHIATRIC: The patient is alert and oriented  x 3.  SKIN: No obvious rash, lesion, or ulcer.   Data Review     CBC w Diff: Lab Results  Component Value Date   WBC 8.2 11/28/2016   HGB 7.6 (L) 11/28/2016   HGB 13.7 01/06/2012   HCT 24.4 (L) 11/28/2016   HCT 41.1 01/06/2012   PLT 359 11/28/2016   PLT 290 01/06/2012   LYMPHOPCT 25 10/03/2016   MONOPCT 10 10/03/2016   EOSPCT 2 10/03/2016   BASOPCT 1 10/03/2016   CMP: Lab Results  Component Value Date   NA 138 11/27/2016   NA 138 01/06/2012    K 3.7 11/27/2016   K 3.5 01/06/2012   CL 107 11/27/2016   CL 105 01/06/2012   CO2 25 11/27/2016   CO2 24 01/06/2012   BUN 9 11/27/2016   BUN 11 01/06/2012   CREATININE 0.63 11/27/2016   CREATININE 0.66 01/06/2012   PROT 7.6 01/06/2012   ALBUMIN 3.9 01/06/2012   BILITOT 0.6 01/06/2012   ALKPHOS 88 01/06/2012   AST 23 01/06/2012   ALT 21 01/06/2012  .  Micro Results No results found for this or any previous visit (from the past 240 hour(s)).      Code Status Orders        Start     Ordered   11/26/16 1202  Do not attempt resuscitation (DNR)  Continuous    Question Answer Comment  In the event of cardiac or respiratory ARREST Do not call a "code blue"   In the event of cardiac or respiratory ARREST Do not perform Intubation, CPR, defibrillation or ACLS   In the event of cardiac or respiratory ARREST Use medication by any route, position, wound care, and other measures to relive pain and suffering. May use oxygen, suction and manual treatment of airway obstruction as needed for comfort.      11/26/16 1201    Code Status History    Date Active Date Inactive Code Status Order ID Comments User Context   08/21/2016  6:24 PM 08/25/2016  6:57 PM Full Code [id number]  Epifanio Lesches, MD ED    Advance Directive Documentation     Most Recent Value  Type of Advance Directive  Living will  Pre-existing out of facility DNR order (yellow form or pink MOST form)  -  "MOST" Form in Place?  -          Follow-up Information    [name], MD On 12/13/2016.   Specialty:  Family Medicine Why:  at 10:30 a.m. Contact information: [place] [address] [phone]        Sindy Guadeloupe, MD On 12/01/2016.   Specialty:  Oncology Why:  at 2:30 p.m. Contact information: [place] [address] [phone]        [name], MD Follow up in 2 week(s).   Specialty:  Surgery Why:  gi bleed Contact  information: [place] [address] [phone]           Discharge Medications   Allergies as of 11/28/2016      Reactions   Codeine Nausea And Vomiting      Medication List    STOP taking these medications   ferrous sulfate 325 (65 FE) MG tablet   ranitidine 150 MG tablet Commonly known as:  ZANTAC     TAKE these medications   acetaminophen 650 MG CR tablet Commonly known as:  TYLENOL Take  by mouth.   ALIGN 4 MG Caps Take 1 capsule by mouth daily.   colestipol 1 g tablet Commonly known as:  COLESTID Take 1 tablet by mouth 2 (two) times daily.   Cranberry 500 MG Caps Take by mouth daily.   estradiol 0.1 MG/GM vaginal cream Commonly known as:  ESTRACE VAGINAL Apply 0.5mg  (pea-sized amount)  just inside the vaginal introitus with a finger-tip every night for two weeks and then Monday, Wednesday and Friday nights.   gabapentin 100 MG capsule Commonly known as:  NEURONTIN Take on tablet each night for two weeks. Increase to two tablets nightly if no improvement.   IMODIUM A-D 2 MG tablet Generic drug:  loperamide Take 2 mg by mouth as needed for diarrhea or loose stools.   lansoprazole 30 MG capsule Commonly known as:  PREVACID Take 1 capsule (30 mg total) by mouth daily at 12 noon.   metoprolol succinate 25 MG 24 hr tablet Commonly known as:  TOPROL-XL Take 25 mg by mouth daily.   NASACORT ALLERGY 24HR 55 MCG/ACT Aero nasal inhaler Generic drug:  triamcinolone Place 2 sprays into the nose as needed.   OXYQUINOLONE SULFATE VAGINAL 0.025 % Gel Place 1 Applicatorful vaginally 2 (two) times a week.   Potassium Gluconate 595 MG Caps Take 1 capsule by mouth daily.   temazepam 15 MG capsule Commonly known as:  RESTORIL Take 15 mg by mouth at bedtime.   vitamin B-12 1000 MCG tablet Commonly known as:  CYANOCOBALAMIN Take 1,000 mcg by mouth daily.          Total Time in preparing paper work, data evaluation and  todays exam - 35 minutes  Dustin Flock M.D on 11/28/2016 at 12:09 [facility]  [facility]   Office  [phone]

## 2016-11-28 NOTE — Care Management Obs Status (Signed)
[facility] NOTIFICATION   Patient Details  Name: [name] MRN: [id number] Date of Birth: 1921/05/18   Medicare Observation Status Notification Given:  Yes    Shelbie Ammons, RN 11/28/2016, 9:13 AM

## 2016-11-28 NOTE — Care Management (Signed)
Admitted to this facility with the diagnosis of anemia. Lives alone. Daughter is [name] [phone]). Sees Dr. Kary Kos every 6 months. Prescriptions are filled at [facility] No home health. No skilled facility. No home oxygen. Shower chair (plastic chair), grab bars, and cane in the home. No Med Alert. Takes care of all basic and instrumental activities of daily living herself, drives. No falls. Decreased appetite. Lost 10 pounds, Son or daughter will transport. 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 Shelbie Ammons RN MSN CCM Care Manager [phone]

## 2016-11-28 NOTE — Plan of Care (Signed)
Problem: Education: Goal: Knowledge of [facility] Education information/materials will improve Outcome: Progressing VSS, free of falls during shift.  Denies pain.  Received requested PRN PO temazepam 15mg  for sleep.  No other needs overnight.  Bed in low position, call bell within reach.  WCTM.

## 2016-11-29 LAB — BPAM RBC
BLOOD PRODUCT EXPIRATION DATE: 201807112359
BLOOD PRODUCT EXPIRATION DATE: 201807112359
Blood Product Expiration Date: 201807112359
ISSUE DATE / TIME: 201807011338
UNIT TYPE AND RH: 6200
Unit Type and Rh: 6200
Unit Type and Rh: 6200

## 2016-11-29 LAB — TYPE AND SCREEN
ABO/RH(D): A POS
Antibody Screen: NEGATIVE
Unit division: 0
Unit division: 0
Unit division: 0

## 2016-12-01 ENCOUNTER — Encounter: Payer: Self-pay | Admitting: Oncology

## 2016-12-01 ENCOUNTER — Inpatient Hospital Stay: Payer: Medicare Other | Attending: Oncology | Admitting: Oncology

## 2016-12-01 ENCOUNTER — Inpatient Hospital Stay: Payer: Medicare Other

## 2016-12-01 VITALS — BP 117/64 | HR 71 | Temp 97.9°F | Resp 18 | Ht 65.0 in | Wt 156.5 lb

## 2016-12-01 DIAGNOSIS — Z800 Family history of malignant neoplasm of digestive organs: Secondary | ICD-10-CM | POA: Diagnosis not present

## 2016-12-01 DIAGNOSIS — Z7901 Long term (current) use of anticoagulants: Secondary | ICD-10-CM | POA: Diagnosis not present

## 2016-12-01 DIAGNOSIS — I82431 Acute embolism and thrombosis of right popliteal vein: Secondary | ICD-10-CM | POA: Insufficient documentation

## 2016-12-01 DIAGNOSIS — Z853 Personal history of malignant neoplasm of breast: Secondary | ICD-10-CM | POA: Diagnosis not present

## 2016-12-01 DIAGNOSIS — I10 Essential (primary) hypertension: Secondary | ICD-10-CM | POA: Diagnosis not present

## 2016-12-01 DIAGNOSIS — K219 Gastro-esophageal reflux disease without esophagitis: Secondary | ICD-10-CM

## 2016-12-01 DIAGNOSIS — Q2733 Arteriovenous malformation of digestive system vessel: Secondary | ICD-10-CM | POA: Diagnosis not present

## 2016-12-01 DIAGNOSIS — G609 Hereditary and idiopathic neuropathy, unspecified: Secondary | ICD-10-CM | POA: Diagnosis not present

## 2016-12-01 DIAGNOSIS — R12 Heartburn: Secondary | ICD-10-CM | POA: Diagnosis not present

## 2016-12-01 DIAGNOSIS — Z8719 Personal history of other diseases of the digestive system: Secondary | ICD-10-CM | POA: Diagnosis not present

## 2016-12-01 DIAGNOSIS — Z171 Estrogen receptor negative status [ER-]: Secondary | ICD-10-CM

## 2016-12-01 DIAGNOSIS — K449 Diaphragmatic hernia without obstruction or gangrene: Secondary | ICD-10-CM | POA: Diagnosis not present

## 2016-12-01 DIAGNOSIS — R0602 Shortness of breath: Secondary | ICD-10-CM

## 2016-12-01 DIAGNOSIS — D500 Iron deficiency anemia secondary to blood loss (chronic): Secondary | ICD-10-CM | POA: Insufficient documentation

## 2016-12-01 DIAGNOSIS — Z9012 Acquired absence of left breast and nipple: Secondary | ICD-10-CM | POA: Diagnosis not present

## 2016-12-01 DIAGNOSIS — Z85038 Personal history of other malignant neoplasm of large intestine: Secondary | ICD-10-CM | POA: Insufficient documentation

## 2016-12-01 DIAGNOSIS — Z79899 Other long term (current) drug therapy: Secondary | ICD-10-CM | POA: Diagnosis not present

## 2016-12-01 DIAGNOSIS — Z808 Family history of malignant neoplasm of other organs or systems: Secondary | ICD-10-CM

## 2016-12-01 DIAGNOSIS — Z87440 Personal history of urinary (tract) infections: Secondary | ICD-10-CM | POA: Insufficient documentation

## 2016-12-01 DIAGNOSIS — C50912 Malignant neoplasm of unspecified site of left female breast: Secondary | ICD-10-CM

## 2016-12-01 DIAGNOSIS — D649 Anemia, unspecified: Secondary | ICD-10-CM

## 2016-12-01 DIAGNOSIS — R5381 Other malaise: Secondary | ICD-10-CM

## 2016-12-01 DIAGNOSIS — M1990 Unspecified osteoarthritis, unspecified site: Secondary | ICD-10-CM | POA: Diagnosis not present

## 2016-12-01 DIAGNOSIS — K209 Esophagitis, unspecified: Secondary | ICD-10-CM | POA: Diagnosis not present

## 2016-12-01 DIAGNOSIS — R600 Localized edema: Secondary | ICD-10-CM | POA: Diagnosis not present

## 2016-12-01 DIAGNOSIS — R5383 Other fatigue: Secondary | ICD-10-CM

## 2016-12-01 DIAGNOSIS — K922 Gastrointestinal hemorrhage, unspecified: Secondary | ICD-10-CM

## 2016-12-01 DIAGNOSIS — Z806 Family history of leukemia: Secondary | ICD-10-CM | POA: Insufficient documentation

## 2016-12-01 LAB — CBC WITH DIFFERENTIAL/PLATELET
BASOS PCT: 1 %
Basophils Absolute: 0.1 10*3/uL (ref 0–0.1)
Eosinophils Absolute: 0.2 10*3/uL (ref 0–0.7)
Eosinophils Relative: 2 %
HEMATOCRIT: 27.7 % — AB (ref 35.0–47.0)
HEMOGLOBIN: 8.7 g/dL — AB (ref 12.0–16.0)
Lymphocytes Relative: 23 %
Lymphs Abs: 2 10*3/uL (ref 1.0–3.6)
MCH: 23.8 pg — ABNORMAL LOW (ref 26.0–34.0)
MCHC: 31.2 g/dL — ABNORMAL LOW (ref 32.0–36.0)
MCV: 76.2 fL — ABNORMAL LOW (ref 80.0–100.0)
MONOS PCT: 8 %
Monocytes Absolute: 0.7 10*3/uL (ref 0.2–0.9)
NEUTROS ABS: 5.6 10*3/uL (ref 1.4–6.5)
NEUTROS PCT: 66 %
Platelets: 453 10*3/uL — ABNORMAL HIGH (ref 150–440)
RBC: 3.63 MIL/uL — ABNORMAL LOW (ref 3.80–5.20)
RDW: 29.5 % — ABNORMAL HIGH (ref 11.5–14.5)
WBC: 8.6 10*3/uL (ref 3.6–11.0)

## 2016-12-01 LAB — IRON AND TIBC
IRON: 25 ug/dL — AB (ref 28–170)
SATURATION RATIOS: 7 % — AB (ref 10.4–31.8)
TIBC: 369 ug/dL (ref 250–450)
UIBC: 344 ug/dL

## 2016-12-01 LAB — SAMPLE TO BLOOD BANK

## 2016-12-01 LAB — FERRITIN: Ferritin: 146 ng/mL (ref 11–307)

## 2016-12-01 NOTE — Progress Notes (Signed)
Hematology/Oncology Consult note [facility] Telephone:([phone] [phone]  Patient Care Team: [name], MD as PCP - General (Family Medicine) Bary Castilla Forest Gleason, MD as Consulting Physician (General Surgery) [name], MD as Referring Physician (Family Medicine)   Name of the patient: [name]  [id number]  May 27, 1921    Reason for referral- iron deficiency anemia due to obscure GI bleeding   Referring physician- Dr. Kary Kos  Date of visit: 12/01/16   History of presenting illness- patient is a 83 year old female with severe iron deficiency anemia. In March 2018 she was admitted for GI bleeding and underwent EGD and colonoscopy. EGD showed mild esophagitis and a large hiatal hernia. Colonoscopy was essentially normal except for an small polyp. She was given blood transfusion and was discharged on oral iron. She has not been able to tolerate oral iron. She was readmitted to the hospital in June 2018 again with severe iron deficiency anemia. She received blood transfusion as well as IV iron. She has not had small bowel capsule study yet and she is 36 and there were concerns anemia and didn't find evidence of bleeding on capsule study she would not be able to tolerate a push enteroscopy. She has therefore been referred to me for IV iron. She has had urinalysis in the past which has been negative for hematuria. Most recent CBC from 11/28/2016 showed white count of 8.2, H&H of 7.6/24.4 with an MCV of 73.8 and a platelet count of 359  Patient reports dark stools but denies any melanotic tarry stools or bright red blood per rectum. Denies any nosebleeds or gum bleeds. Denies any consistent use of NSAIDs. She does have gradually worsening shortness of breath as well as leg swelling over the last few months. She is independent of her ADLs  ECOG PS- 1  Pain scale- 0   Review of systems- Review of Systems  Constitutional: Positive for  malaise/fatigue. Negative for chills, fever and weight loss.  HENT: Negative for congestion, ear discharge and nosebleeds.   Eyes: Negative for blurred vision.  Respiratory: Positive for shortness of breath. Negative for cough, hemoptysis, sputum production and wheezing.   Cardiovascular: Positive for leg swelling. Negative for chest pain, palpitations, orthopnea and claudication.  Gastrointestinal: Positive for heartburn. Negative for abdominal pain, blood in stool, constipation, diarrhea, melena, nausea and vomiting.  Genitourinary: Negative for dysuria, flank pain, frequency, hematuria and urgency.  Musculoskeletal: Negative for back pain, joint pain and myalgias.  Skin: Negative for rash.  Neurological: Negative for dizziness, tingling, focal weakness, seizures, weakness and headaches.  Endo/Heme/Allergies: Does not bruise/bleed easily.  Psychiatric/Behavioral: Negative for depression and suicidal ideas. The patient does not have insomnia.     Allergies  Allergen Reactions  . Codeine Nausea And Vomiting    Patient Active Problem List   Diagnosis Date Noted  . Personal history of malignant neoplasm of breast 11/09/2016  . GI bleed 08/21/2016  . Hereditary and idiopathic peripheral neuropathy 09/09/2015  . Malignant neoplasm of left female breast ([place]) 09/09/2015  . Prolapse of vaginal wall with midline cystocele 08/10/2015  . Incomplete bladder emptying 08/10/2015  . Midline low back pain without sciatica 08/10/2015  . Fecal incontinence 08/10/2015  . Recurrent UTI 07/19/2015  . Atrophic vaginitis 07/19/2015  . Adenomatous colon polyp 02/24/2015  . Anemia 02/24/2015  . Chronic sinusitis 02/24/2015  . Diverticulosis 02/24/2015  . Esophagitis 02/24/2015  . GERD (gastroesophageal reflux disease) 02/24/2015  . Hiatal hernia 02/24/2015  . Breast cancer, left breast ([place])  09/05/2012  . Hypertension      Past Medical History:  Diagnosis Date  . Allergy   . Arthritis    back   . Benign neoplasm of skin, site unspecified   . Bowel trouble   . Breast cancer ([place])   . Cancer ([place])    colon 20 years ago  . Cancer of breast [facility]) April 10,.2013   Left breast, 3.8 cm, 4 cm axillary node, T2, N1a, ER negative PR negative, HER-2/neu not amplified.  . Cataract   . Colon cancer ([place])   . Hard of hearing   . Heartburn   . Hiatal hernia   . History of stomach ulcers   . History of stomach ulcers   . Hypertension 2012  . Personal history of malignant neoplasm of breast 2013   LEFT MASTECTOMY,left modified radical mastectomy on September 06, 2011 483.8 cm primary tumor with a 4.6 cm axillary metastasis. 3 of 13 nodes were positive. T2,N1a lesion. This was a triple negative lesion  . Shingles Dec 2015  . Vaginal atrophy 08/10/2015     Past Surgical History:  Procedure Laterality Date  . ABDOMINAL HYSTERECTOMY     42 YEARS AGO  . BREAST SURGERY Left 09-06-11   left modified radical mastectomy on September 06, 2011 483.8 cm primary tumor with a 4.6 cm axillary metastasis. 3 of 13 nodes were positive. T2,N1a lesion. This was a triple negative lesion  . CARPAL TUNNEL RELEASE Right September 25, 2014   Skip Estimable, M.D.  . COLON RESECTION  1985  . COLON SURGERY  20 YEARS AGO  . COLONOSCOPY  2012   DR. ELLIOTT  . COLONOSCOPY WITH PROPOFOL N/A 08/25/2016   Procedure: COLONOSCOPY WITH PROPOFOL;  Surgeon: [name], MD;  Location: [facility] ENDOSCOPY;  Service: Gastroenterology;  Laterality: N/A;  . ESOPHAGOGASTRODUODENOSCOPY (EGD) WITH PROPOFOL N/A 08/23/2016   Procedure: ESOPHAGOGASTRODUODENOSCOPY (EGD) WITH PROPOFOL;  Surgeon: [name], MD;  Location: [facility] ENDOSCOPY;  Service: Endoscopy;  Laterality: N/A;  . MASTECTOMY Left 2013   left modified radical mastectomy on September 06, 2011 Stage 2; 3.8 cm primary tumor with a 4.6 cm axillary metastasis. 3 of 13 nodes were positive. T2,N1a lesion. This was a triple negative lesion  . UPPER GI ENDOSCOPY  2012   BLEEDING    Social History    Social History  . Marital status: Widowed    Spouse name: N/A  . Number of children: N/A  . Years of education: N/A   Occupational History  . Not on file.   Social History Main Topics  . Smoking status: Never Smoker  . Smokeless tobacco: Never Used  . Alcohol use No  . Drug use: No  . Sexual activity: No   Other Topics Concern  . Not on file   Social History Narrative  . No narrative on file     Family History  Problem Relation Age of Onset  . Lung cancer Brother        colono ca  . Cancer Brother   . Skin cancer Daughter   . Leukemia Father   . Cancer Father   . Colon cancer Mother   . Cancer Mother   . Skin cancer Son   . Kidney disease Neg Hx   . Bladder Cancer Neg Hx      Current Outpatient Prescriptions:  .  acetaminophen (TYLENOL) 650 MG CR tablet, Take by mouth., Disp: , Rfl:  .  colestipol (COLESTID) 1 g tablet, Take 1 tablet by mouth  2 (two) times daily., Disp: , Rfl:  .  Cranberry 500 MG CAPS, Take by mouth daily., Disp: , Rfl:  .  estradiol (ESTRACE VAGINAL) 0.1 MG/GM vaginal cream, Apply 0.40m (pea-sized amount)  just inside the vaginal introitus with a finger-tip every night for two weeks and then Monday, Wednesday and Friday nights., Disp: 30 g, Rfl: 12 .  gabapentin (NEURONTIN) 100 MG capsule, Take on tablet each night for two weeks. Increase to two tablets nightly if no improvement., Disp: 90 capsule, Rfl: 6 .  lansoprazole (PREVACID) 30 MG capsule, Take 1 capsule (30 mg total) by mouth daily at 12 noon., Disp: 60 capsule, Rfl: 1 .  loperamide (IMODIUM A-D) 2 MG tablet, Take 2 mg by mouth as needed for diarrhea or loose stools., Disp: , Rfl:  .  metoprolol succinate (TOPROL-XL) 25 MG 24 hr tablet, Take 25 mg by mouth daily., Disp: , Rfl:  .  OXYQUINOLONE SULFATE VAGINAL 0.025 % GEL, Place 1 Applicatorful vaginally 2 (two) times a week., Disp: 1 Tube, Rfl: 3 .  Potassium Gluconate 595 MG CAPS, Take 1 capsule by mouth daily. , Disp: , Rfl:  .   Probiotic Product (ALIGN) 4 MG CAPS, Take 1 capsule by mouth daily. , Disp: , Rfl:  .  temazepam (RESTORIL) 15 MG capsule, Take 15 mg by mouth at bedtime., Disp: , Rfl:  .  triamcinolone (NASACORT ALLERGY 24HR) 55 MCG/ACT AERO nasal inhaler, Place 2 sprays into the nose as needed. , Disp: , Rfl:  .  vitamin B-12 (CYANOCOBALAMIN) 1000 MCG tablet, Take 1,000 mcg by mouth daily., Disp: , Rfl:    Physical exam:  Vitals:   12/01/16 1440  BP: 117/64  Pulse: 71  Resp: 18  Temp: 97.9 F (36.6 C)  TempSrc: Tympanic  Weight: 156 lb 8 oz (71 kg)  Height: _0  (1.651 m)   Physical Exam  Constitutional: She is oriented to person, place, and time and well-developed, well-nourished, and in no distress.  HENT:  Head: Normocephalic and atraumatic.  Eyes: EOM are normal. Pupils are equal, round, and reactive to light.  Neck: Normal range of motion.  Cardiovascular: Normal rate, regular rhythm and normal heart sounds.   Pulmonary/Chest: Effort normal and breath sounds normal.  Abdominal: Soft. Bowel sounds are normal.  Musculoskeletal: She exhibits edema (b/l +1 edema R >L).  Neurological: She is alert and oriented to person, place, and time.  Skin: Skin is warm and dry.       CMP Latest Ref Rng & Units 11/27/2016  Glucose 65 - 99 mg/dL 95  BUN 6 - 20 mg/dL 9  Creatinine 0.44 - 1.00 mg/dL 0.63  Sodium 135 - 145 mmol/L 138  Potassium 3.5 - 5.1 mmol/L 3.7  Chloride 101 - 111 mmol/L 107  CO2 22 - 32 mmol/L 25  Calcium 8.9 - 10.3 mg/dL 8.0(L)  Total Protein 6.4 - 8.2 g/dL -  Total Bilirubin 0.2 - 1.0 mg/dL -  Alkaline Phos 50 - 136 Unit/L -  AST 15 - 37 Unit/L -  ALT 12 - 78 U/L -   CBC Latest Ref Rng & Units 11/28/2016  WBC 3.6 - 11.0 K/uL 8.2  Hemoglobin 12.0 - 16.0 g/dL 7.6(L)  Hematocrit 35.0 - 47.0 % 24.4(L)  Platelets 150 - 440 K/uL 359     Assessment and plan- Patient is a 83y.o. female referred to me for severe iron deficiency anemia due to obscure GI bleeding  1. Workup for  iron deficiency anemia has  been negative so far. Patient is 83 years of age but fairly functional and I would therefore recommend giving her IV iron for symptom management and to maintain her quality of life. We are unable to accommodate IV iron or blood transfusion today. I will repeat her CBC and ferritin and iron studies today. If her hemoglobin is less than 7 we will plan for elective blood transfusion on Monday. If her hemoglobin is more than 7 then I will plan to give HER-2 weekly doses of ferriheme 510 mg IV. I discussed risks and benefits of blood transfusion as well as IV iron including all but not limited to risk of infusion reaction which the patient understands and agrees to proceed. I will see her back in 3 weeks' time with repeat CBC. If she continues to have significant iron deficiency anemia requiring frequent blood transfusions and need for IV iron, it may be worthwhile to get a capsule study to see if she has bleeding AVMs. Non-invasive measures such as octreotide could be tried for control of bleeding AVMs in her case.   Patient and her family are in understanding of the plan   Thank you for this kind referral and the opportunity to participate in the care of this patient   Visit Diagnosis 1. Iron deficiency anemia due to chronic blood loss   2. Anemia, unspecified type     Dr. Randa Evens, MD, MPH [facility] at [facility] Pager- [phone] 12/01/2016

## 2016-12-01 NOTE — Progress Notes (Signed)
Patient here today as new evaluation regarding anemia.  Referred by Dr. Kary Kos.  Patient recently seen by Dr. Vicente Males who performed and upper endoscopy as well as colonoscopy.  Patient complains of being SOB since March.  She was seen by pulmonologist who states she does not have pulmonary problems.   Patient has a history of breast and colon cancer.

## 2016-12-04 ENCOUNTER — Inpatient Hospital Stay: Payer: Medicare Other

## 2016-12-04 VITALS — BP 129/76 | HR 66 | Temp 98.4°F | Resp 20

## 2016-12-04 DIAGNOSIS — D500 Iron deficiency anemia secondary to blood loss (chronic): Secondary | ICD-10-CM

## 2016-12-04 MED ORDER — SODIUM CHLORIDE 0.9 % IV SOLN
Freq: Once | INTRAVENOUS | Status: AC
  Administered 2016-12-04: 14:00:00 via INTRAVENOUS
  Filled 2016-12-04: qty 1000

## 2016-12-04 MED ORDER — FERUMOXYTOL INJECTION 510 MG/17 ML
510.0000 mg | Freq: Once | INTRAVENOUS | Status: AC
  Administered 2016-12-04: 510 mg via INTRAVENOUS
  Filled 2016-12-04: qty 17

## 2016-12-05 ENCOUNTER — Ambulatory Visit: Payer: Medicare Other | Admitting: Gastroenterology

## 2016-12-05 ENCOUNTER — Telehealth: Payer: Self-pay

## 2016-12-05 NOTE — Telephone Encounter (Signed)
-----   Message from [name], MD sent at 12/04/2016  9:56 AM EDT ----- Dahlia Byes Can you check if she has a script for zantac 150 mg BID and make sure she is advised to take daily  Kiran     ----- Message ----- From: Sindy Guadeloupe, MD Sent: 12/01/2016   4:05 PM To: [name], MD  Hello,  Patient states you had recommended protonix instead of zantac but she was not discharged on that from the hospital. Please look into it and clarify with her.  Thanks, Astrid Divine

## 2016-12-07 ENCOUNTER — Other Ambulatory Visit: Payer: Self-pay

## 2016-12-07 DIAGNOSIS — K219 Gastro-esophageal reflux disease without esophagitis: Secondary | ICD-10-CM

## 2016-12-07 MED ORDER — PANTOPRAZOLE SODIUM 40 MG PO TBEC: 40.0000 mg | DELAYED_RELEASE_TABLET | Freq: Every day | ORAL | 3 refills | Status: DC

## 2016-12-11 ENCOUNTER — Inpatient Hospital Stay: Payer: Medicare Other

## 2016-12-11 VITALS — BP 167/77 | HR 69 | Temp 98.1°F | Resp 20

## 2016-12-11 DIAGNOSIS — D500 Iron deficiency anemia secondary to blood loss (chronic): Secondary | ICD-10-CM

## 2016-12-11 MED ORDER — SODIUM CHLORIDE 0.9 % IV SOLN
510.0000 mg | Freq: Once | INTRAVENOUS | Status: AC
  Administered 2016-12-11: 510 mg via INTRAVENOUS
  Filled 2016-12-11: qty 17

## 2016-12-11 MED ORDER — SODIUM CHLORIDE 0.9 % IV SOLN
Freq: Once | INTRAVENOUS | Status: AC
  Administered 2016-12-11: 15:00:00 via INTRAVENOUS
  Filled 2016-12-11: qty 1000

## 2016-12-13 ENCOUNTER — Other Ambulatory Visit: Payer: Self-pay | Admitting: Family Medicine

## 2016-12-13 ENCOUNTER — Ambulatory Visit
Admission: RE | Admit: 2016-12-13 | Discharge: 2016-12-13 | Disposition: A | Discharge status: Home / Self Care | Payer: Medicare Other | Source: Ambulatory Visit | Attending: Family Medicine | Admitting: Family Medicine

## 2016-12-13 DIAGNOSIS — I82431 Acute embolism and thrombosis of right popliteal vein: Secondary | ICD-10-CM | POA: Insufficient documentation

## 2016-12-13 DIAGNOSIS — M79661 Pain in right lower leg: Secondary | ICD-10-CM

## 2016-12-13 DIAGNOSIS — I82890 Acute embolism and thrombosis of other specified veins: Secondary | ICD-10-CM | POA: Diagnosis not present

## 2016-12-13 DIAGNOSIS — R609 Edema, unspecified: Secondary | ICD-10-CM | POA: Insufficient documentation

## 2016-12-15 ENCOUNTER — Telehealth: Payer: Self-pay | Admitting: *Deleted

## 2016-12-15 NOTE — Telephone Encounter (Signed)
Called pt home and spoke to her daughter. Daughter states that she drives her mom to appts and pt can't hear well on the phone. I had told her that she has appt for next Friday but Dr. Kary Kos office called and said we may need to see pt sooner due to the blooc clot she was dx with . Family agreeable to come 7/24 at 2 pm for labs and 2:15 to see md. appt changed.

## 2016-12-19 ENCOUNTER — Encounter: Payer: Self-pay | Admitting: Oncology

## 2016-12-19 ENCOUNTER — Inpatient Hospital Stay: Payer: Medicare Other

## 2016-12-19 ENCOUNTER — Inpatient Hospital Stay (HOSPITAL_BASED_OUTPATIENT_CLINIC_OR_DEPARTMENT_OTHER): Payer: Medicare Other | Admitting: Oncology

## 2016-12-19 ENCOUNTER — Other Ambulatory Visit: Payer: Self-pay | Admitting: *Deleted

## 2016-12-19 VITALS — BP 160/73 | HR 68 | Temp 98.6°F | Ht 65.0 in | Wt 151.4 lb

## 2016-12-19 DIAGNOSIS — I82431 Acute embolism and thrombosis of right popliteal vein: Secondary | ICD-10-CM | POA: Diagnosis not present

## 2016-12-19 DIAGNOSIS — I10 Essential (primary) hypertension: Secondary | ICD-10-CM

## 2016-12-19 DIAGNOSIS — D500 Iron deficiency anemia secondary to blood loss (chronic): Secondary | ICD-10-CM

## 2016-12-19 DIAGNOSIS — R5383 Other fatigue: Secondary | ICD-10-CM

## 2016-12-19 DIAGNOSIS — Z171 Estrogen receptor negative status [ER-]: Secondary | ICD-10-CM

## 2016-12-19 DIAGNOSIS — Z800 Family history of malignant neoplasm of digestive organs: Secondary | ICD-10-CM

## 2016-12-19 DIAGNOSIS — Z79899 Other long term (current) drug therapy: Secondary | ICD-10-CM

## 2016-12-19 DIAGNOSIS — K922 Gastrointestinal hemorrhage, unspecified: Secondary | ICD-10-CM

## 2016-12-19 DIAGNOSIS — Z853 Personal history of malignant neoplasm of breast: Secondary | ICD-10-CM

## 2016-12-19 DIAGNOSIS — Q2733 Arteriovenous malformation of digestive system vessel: Secondary | ICD-10-CM

## 2016-12-19 DIAGNOSIS — Z87440 Personal history of urinary (tract) infections: Secondary | ICD-10-CM

## 2016-12-19 DIAGNOSIS — M1990 Unspecified osteoarthritis, unspecified site: Secondary | ICD-10-CM

## 2016-12-19 DIAGNOSIS — G609 Hereditary and idiopathic neuropathy, unspecified: Secondary | ICD-10-CM

## 2016-12-19 DIAGNOSIS — Z808 Family history of malignant neoplasm of other organs or systems: Secondary | ICD-10-CM

## 2016-12-19 DIAGNOSIS — Z85038 Personal history of other malignant neoplasm of large intestine: Secondary | ICD-10-CM

## 2016-12-19 DIAGNOSIS — K449 Diaphragmatic hernia without obstruction or gangrene: Secondary | ICD-10-CM

## 2016-12-19 DIAGNOSIS — K219 Gastro-esophageal reflux disease without esophagitis: Secondary | ICD-10-CM

## 2016-12-19 DIAGNOSIS — K209 Esophagitis, unspecified: Secondary | ICD-10-CM

## 2016-12-19 DIAGNOSIS — Z7901 Long term (current) use of anticoagulants: Secondary | ICD-10-CM

## 2016-12-19 DIAGNOSIS — D649 Anemia, unspecified: Secondary | ICD-10-CM

## 2016-12-19 DIAGNOSIS — Z806 Family history of leukemia: Secondary | ICD-10-CM

## 2016-12-19 DIAGNOSIS — Z9012 Acquired absence of left breast and nipple: Secondary | ICD-10-CM

## 2016-12-19 LAB — CBC
HEMATOCRIT: 37.2 % (ref 35.0–47.0)
Hemoglobin: 12.1 g/dL (ref 12.0–16.0)
MCH: 26.9 pg (ref 26.0–34.0)
MCHC: 32.5 g/dL (ref 32.0–36.0)
MCV: 82.9 fL (ref 80.0–100.0)
Platelets: 311 10*3/uL (ref 150–440)
RBC: 4.49 MIL/uL (ref 3.80–5.20)
RDW: 30.5 % — AB (ref 11.5–14.5)
WBC: 7.5 10*3/uL (ref 3.6–11.0)

## 2016-12-19 NOTE — Progress Notes (Signed)
Patient here for follow from recent hospitalization. She developed a DVT in her right foot.

## 2016-12-21 NOTE — Progress Notes (Signed)
Hematology/Oncology Consult note [facility]  [phone] [phone]  Patient Care Team: [name], MD as PCP - General (Family Medicine) Bary Castilla Forest Gleason, MD as Consulting Physician (General Surgery) [name], MD as Referring Physician (Family Medicine)   Name of the patient: [name]  [id number]  1921/02/15   Date of visit: 12/21/16  Diagnosis- 1. iron deficiency anemia due to obscure GI bleeding  2. RLE DVT  Chief complaint/ Reason for visit- discuss anticoagulation options  Heme/Onc history: patient is a 83 year old female with severe iron deficiency anemia. In March 2018 she was admitted for GI bleeding and underwent EGD and colonoscopy. EGD showed mild esophagitis and a large hiatal hernia. Colonoscopy was essentially normal except for an small polyp. She was given blood transfusion and was discharged on oral iron. She has not been able to tolerate oral iron. She was readmitted to the hospital in June 2018 again with severe iron deficiency anemia. She received blood transfusion as well as IV iron. She has not had small bowel capsule study yet and she is 39 and there were concerns anemia and didn't find evidence of bleeding on capsule study she would not be able to tolerate a push enteroscopy. She has therefore been referred to me for IV iron. She has had urinalysis in the past which has been negative for hematuria. Most recent CBC from 11/28/2016 showed white count of 8.2, H&H of 7.6/24.4 with an MCV of 73.8 and a platelet count of 359  Patient reports dark stools but denies any melanotic tarry stools or bright red blood per rectum. Denies any nosebleeds or gum bleeds. Denies any consistent use of NSAIDs. She does have gradually worsening shortness of breath as well as leg swelling over the last few months. She is independent of her ADLs   patient received 2 doses of feraheme in July 2018. She then presented to her pcp  with RLE pain and swelling. She underwent doppler of RLE which showed: Positive for deep venous thrombosis of the right popliteal vein, and some of the right calf veins.   Given concern for GI bleed patient was started on prophylactic dose of lovenox  Interval history- Patient reports pain and swelling in her right leg has gone down. She is using lovenoxonce a day but does not like the shots. Denies any melena or bleeding in stool  ECOG PS- 2 Pain scale- 0   Review of systems- Review of Systems  Constitutional: Negative for chills, fever, malaise/fatigue and weight loss.  HENT: Negative for congestion, ear discharge and nosebleeds.   Eyes: Negative for blurred vision.  Respiratory: Negative for cough, hemoptysis, sputum production, shortness of breath and wheezing.   Cardiovascular: Negative for chest pain, palpitations, orthopnea and claudication.  Gastrointestinal: Negative for abdominal pain, blood in stool, constipation, diarrhea, heartburn, melena, nausea and vomiting.  Genitourinary: Negative for dysuria, flank pain, frequency, hematuria and urgency.  Musculoskeletal: Negative for back pain, joint pain and myalgias.       Right leg pain and swelling  Skin: Negative for rash.  Neurological: Negative for dizziness, tingling, focal weakness, seizures, weakness and headaches.  Endo/Heme/Allergies: Does not bruise/bleed easily.  Psychiatric/Behavioral: Negative for depression and suicidal ideas. The patient does not have insomnia.       Allergies  Allergen Reactions  . Codeine Nausea And Vomiting     Past Medical History:  Diagnosis Date  . Allergy   . Arthritis    back  . Benign neoplasm  of skin, site unspecified   . Bowel trouble   . Breast cancer ([place])   . Cancer ([place])    colon 20 years ago  . Cancer of breast [facility]) April 10,.2013   Left breast, 3.8 cm, 4 cm axillary node, T2, N1a, ER negative PR negative, HER-2/neu not amplified.  . Cataract   . Colon cancer  ([place])   . Hard of hearing   . Heartburn   . Hiatal hernia   . History of stomach ulcers   . History of stomach ulcers   . Hypertension 2012  . Personal history of malignant neoplasm of breast 2013   LEFT MASTECTOMY,left modified radical mastectomy on September 06, 2011 483.8 cm primary tumor with a 4.6 cm axillary metastasis. 3 of 13 nodes were positive. T2,N1a lesion. This was a triple negative lesion  . Shingles Dec 2015  . Vaginal atrophy 08/10/2015     Past Surgical History:  Procedure Laterality Date  . ABDOMINAL HYSTERECTOMY     42 YEARS AGO  . BREAST SURGERY Left 09-06-11   left modified radical mastectomy on September 06, 2011 483.8 cm primary tumor with a 4.6 cm axillary metastasis. 3 of 13 nodes were positive. T2,N1a lesion. This was a triple negative lesion  . CARPAL TUNNEL RELEASE Right September 25, 2014   Skip Estimable, M.D.  . COLON RESECTION  1985  . COLON SURGERY  20 YEARS AGO  . COLONOSCOPY  2012   DR. ELLIOTT  . COLONOSCOPY WITH PROPOFOL N/A 08/25/2016   Procedure: COLONOSCOPY WITH PROPOFOL;  Surgeon: [name], MD;  Location: [facility] ENDOSCOPY;  Service: Gastroenterology;  Laterality: N/A;  . ESOPHAGOGASTRODUODENOSCOPY (EGD) WITH PROPOFOL N/A 08/23/2016   Procedure: ESOPHAGOGASTRODUODENOSCOPY (EGD) WITH PROPOFOL;  Surgeon: [name], MD;  Location: [facility] ENDOSCOPY;  Service: Endoscopy;  Laterality: N/A;  . MASTECTOMY Left 2013   left modified radical mastectomy on September 06, 2011 Stage 2; 3.8 cm primary tumor with a 4.6 cm axillary metastasis. 3 of 13 nodes were positive. T2,N1a lesion. This was a triple negative lesion  . UPPER GI ENDOSCOPY  2012   BLEEDING    Social History   Social History  . Marital status: Widowed    Spouse name: N/A  . Number of children: N/A  . Years of education: N/A   Occupational History  . Not on file.   Social History Main Topics  . Smoking status: Never Smoker  . Smokeless tobacco: Never Used  . Alcohol use No  . Drug use: No  . Sexual  activity: No   Other Topics Concern  . Not on file   Social History Narrative  . No narrative on file    Family History  Problem Relation Age of Onset  . Lung cancer Brother        colono ca  . Cancer Brother   . Skin cancer Daughter   . Leukemia Father   . Cancer Father   . Colon cancer Mother   . Cancer Mother   . Skin cancer Son   . Kidney disease Neg Hx   . Bladder Cancer Neg Hx      Current Outpatient Prescriptions:  .  acetaminophen (TYLENOL) 650 MG CR tablet, Take by mouth., Disp: , Rfl:  .  colestipol (COLESTID) 1 g tablet, Take 1 tablet by mouth 2 (two) times daily., Disp: , Rfl:  .  Cranberry 500 MG CAPS, Take by mouth daily., Disp: , Rfl:  .  enoxaparin (LOVENOX) 60 MG/0.6ML injection, Inject into  the skin., Disp: , Rfl:  .  estradiol (ESTRACE VAGINAL) 0.1 MG/GM vaginal cream, Apply 0.109m (pea-sized amount)  just inside the vaginal introitus with a finger-tip every night for two weeks and then Monday, Wednesday and Friday nights., Disp: 30 g, Rfl: 12 .  gabapentin (NEURONTIN) 100 MG capsule, Take on tablet each night for two weeks. Increase to two tablets nightly if no improvement., Disp: 90 capsule, Rfl: 6 .  lansoprazole (PREVACID) 30 MG capsule, Take 1 capsule (30 mg total) by mouth daily at 12 noon., Disp: 60 capsule, Rfl: 1 .  loperamide (IMODIUM A-D) 2 MG tablet, Take 2 mg by mouth as needed for diarrhea or loose stools., Disp: , Rfl:  .  metoprolol succinate (TOPROL-XL) 25 MG 24 hr tablet, Take 25 mg by mouth daily., Disp: , Rfl:  .  OXYQUINOLONE SULFATE VAGINAL 0.025 % GEL, Place 1 Applicatorful vaginally 2 (two) times a week., Disp: 1 Tube, Rfl: 3 .  pantoprazole (PROTONIX) 40 MG tablet, Take 1 tablet (40 mg total) by mouth daily., Disp: 90 tablet, Rfl: 3 .  Potassium Gluconate 595 MG CAPS, Take 1 capsule by mouth daily. , Disp: , Rfl:  .  Probiotic Product (ALIGN) 4 MG CAPS, Take 1 capsule by mouth daily. , Disp: , Rfl:  .  temazepam (RESTORIL) 15 MG  capsule, Take 15 mg by mouth at bedtime., Disp: , Rfl:  .  triamcinolone (NASACORT ALLERGY 24HR) 55 MCG/ACT AERO nasal inhaler, Place 2 sprays into the nose as needed. , Disp: , Rfl:  .  vitamin B-12 (CYANOCOBALAMIN) 1000 MCG tablet, Take 1,000 mcg by mouth daily., Disp: , Rfl:   Physical exam:  Vitals:   12/19/16 1415  BP: (!) 160/73  Pulse: 68  Temp: 98.6 F (37 C)  TempSrc: Tympanic  Weight: 151 lb 6.4 oz (68.7 kg)  Height: 5' 5" (1.651 m)   Physical Exam  Constitutional: She is oriented to person, place, and time.  Elderly lady in no acute distress  HENT:  Head: Normocephalic and atraumatic.  Eyes: Pupils are equal, round, and reactive to light. EOM are normal.  Neck: Normal range of motion.  Cardiovascular: Normal rate, regular rhythm and normal heart sounds.   Pulmonary/Chest: Effort normal and breath sounds normal.  Abdominal: Soft. Bowel sounds are normal.  Musculoskeletal:  RLE swollen. B/ l edema R >L  Neurological: She is alert and oriented to person, place, and time.  Skin: Skin is warm and dry.     CMP Latest Ref Rng & Units 11/27/2016  Glucose 65 - 99 mg/dL 95  BUN 6 - 20 mg/dL 9  Creatinine 0.44 - 1.00 mg/dL 0.63  Sodium 135 - 145 mmol/L 138  Potassium 3.5 - 5.1 mmol/L 3.7  Chloride 101 - 111 mmol/L 107  CO2 22 - 32 mmol/L 25  Calcium 8.9 - 10.3 mg/dL 8.0(L)  Total Protein 6.4 - 8.2 g/dL -  Total Bilirubin 0.2 - 1.0 mg/dL -  Alkaline Phos 50 - 136 Unit/L -  AST 15 - 37 Unit/L -  ALT 12 - 78 U/L -   CBC Latest Ref Rng & Units 12/19/2016  WBC 3.6 - 11.0 K/uL 7.5  Hemoglobin 12.0 - 16.0 g/dL 12.1  Hematocrit 35.0 - 47.0 % 37.2  Platelets 150 - 440 K/uL 311    No images are attached to the encounter.  [place]Venous Img Lower Unilateral Right  Addendum Date: 12/13/2016   ADDENDUM REPORT: 12/13/2016 14:18 ADDENDUM: Study discussed by telephone with Dr. JJeneen Rinks  [place] on 12/13/2016 at 1415 hours. Electronically Signed   By: Genevie Ann M.D.   On: 12/13/2016 14:18     Result Date: 12/13/2016 CLINICAL DATA:  83 year old female with right calf pain and edema. Varicose veins. EXAM: RIGHT LOWER EXTREMITY VENOUS DOPPLER ULTRASOUND TECHNIQUE: Gray-scale sonography with graded compression, as well as color Doppler and duplex ultrasound were performed to evaluate the lower extremity deep venous systems from the level of the common femoral vein and including the common femoral, femoral, profunda femoral, popliteal and calf veins including the posterior tibial, peroneal and gastrocnemius veins when visible. The superficial great saphenous vein was also interrogated. Spectral Doppler was utilized to evaluate flow at rest and with distal augmentation maneuvers in the common femoral, femoral and popliteal veins. COMPARISON:  None. FINDINGS: Contralateral Common Femoral Vein: Respiratory phasicity is normal and symmetric with the symptomatic side. No evidence of thrombus. Normal compressibility. Common Femoral Vein: No evidence of thrombus. Normal compressibility, respiratory phasicity and response to augmentation. Saphenofemoral Junction: No evidence of thrombus. Normal compressibility and flow on color Doppler imaging. Profunda Femoral Vein: No evidence of thrombus. Normal compressibility and flow on color Doppler imaging. Femoral Vein: No evidence of thrombus. Normal compressibility, respiratory phasicity and response to augmentation. Popliteal Vein: Evidence of nonocclusive thrombus in the proximal right popliteal vein (Image 31), while the distal right popliteal appears occluded by echogenic thrombus (images 33 through 35). Calf Veins: Occlusion of one of the posterior calf veins on the right (images 38 and 42), while other calf veins appear to remain patent. Venous Reflux:  None. Other Findings:  None. IMPRESSION: Positive for deep venous thrombosis of the right popliteal vein, and some of the right calf veins. Electronically Signed: By: Genevie Ann M.D. On: 12/13/2016 14:11      Assessment and plan- Patient is a 83 y.o. female who sees me for following issues:  1. Iron deficiency anemia secondary to GI bleeding- H/H significantly improved after IV iron. Will monitor cbc Q2 weeks while she is on blood thinner  2. RLE DVT- patient has right popliteal vein DVT. May have been post recent hospitalization and lack of mobility. I would recommend 3 months of anticoagulation as long as her H/H is stable and she does not have GI bleed. Discussed risks and benefits of lovenox versus coumadin versus NOAC. Would not recommend NOAC given lack of reversal agent in the setting of recent GI bleed. Patient prefers pill over shots and would therefore like to switch to coumadin. I have spoken to her pcp's ffice and recommend lovenox bridging while INR is therapeutic on coumadin and then continue coumadin to keep INR between 2-3. Coumadin to be managed by Dr. Kary Kos. I will chceck cbc Q2 weeks. See her back in 1 month. If she were to develop GI bleed on coumadin, we will stop coumadin and insert IVC filter at that point. She will call us sooner if she were to develop GI bleeding in the interim   Total face to face encounter time for this patient visit was 30 min. >50% of the time was  spent in counseling and coordination of care.     Visit Diagnosis 1. Iron deficiency anemia due to chronic blood loss   2. Acute deep vein thrombosis (DVT) of popliteal vein of right lower extremity (HCC)      Dr. Randa Evens, MD, MPH [place] [facility] Pager- [phone] 12/21/2016 8:33 AM

## 2016-12-22 ENCOUNTER — Ambulatory Visit: Payer: Medicare Other | Admitting: Oncology

## 2016-12-22 ENCOUNTER — Other Ambulatory Visit: Payer: Medicare Other

## 2017-01-02 ENCOUNTER — Other Ambulatory Visit: Payer: Self-pay | Admitting: *Deleted

## 2017-01-02 ENCOUNTER — Inpatient Hospital Stay: Payer: Medicare Other | Attending: Oncology

## 2017-01-02 ENCOUNTER — Telehealth: Payer: Self-pay | Admitting: *Deleted

## 2017-01-02 DIAGNOSIS — K922 Gastrointestinal hemorrhage, unspecified: Secondary | ICD-10-CM | POA: Diagnosis not present

## 2017-01-02 DIAGNOSIS — Z853 Personal history of malignant neoplasm of breast: Secondary | ICD-10-CM | POA: Diagnosis not present

## 2017-01-02 DIAGNOSIS — I824Y1 Acute embolism and thrombosis of unspecified deep veins of right proximal lower extremity: Secondary | ICD-10-CM

## 2017-01-02 DIAGNOSIS — K449 Diaphragmatic hernia without obstruction or gangrene: Secondary | ICD-10-CM | POA: Insufficient documentation

## 2017-01-02 DIAGNOSIS — Z7901 Long term (current) use of anticoagulants: Secondary | ICD-10-CM | POA: Insufficient documentation

## 2017-01-02 DIAGNOSIS — Z806 Family history of leukemia: Secondary | ICD-10-CM | POA: Diagnosis not present

## 2017-01-02 DIAGNOSIS — H9190 Unspecified hearing loss, unspecified ear: Secondary | ICD-10-CM | POA: Diagnosis not present

## 2017-01-02 DIAGNOSIS — Z800 Family history of malignant neoplasm of digestive organs: Secondary | ICD-10-CM | POA: Insufficient documentation

## 2017-01-02 DIAGNOSIS — Z9012 Acquired absence of left breast and nipple: Secondary | ICD-10-CM | POA: Diagnosis not present

## 2017-01-02 DIAGNOSIS — Z801 Family history of malignant neoplasm of trachea, bronchus and lung: Secondary | ICD-10-CM | POA: Insufficient documentation

## 2017-01-02 DIAGNOSIS — Z79899 Other long term (current) drug therapy: Secondary | ICD-10-CM | POA: Diagnosis not present

## 2017-01-02 DIAGNOSIS — R0602 Shortness of breath: Secondary | ICD-10-CM | POA: Diagnosis not present

## 2017-01-02 DIAGNOSIS — Z85038 Personal history of other malignant neoplasm of large intestine: Secondary | ICD-10-CM | POA: Diagnosis not present

## 2017-01-02 DIAGNOSIS — D500 Iron deficiency anemia secondary to blood loss (chronic): Secondary | ICD-10-CM | POA: Insufficient documentation

## 2017-01-02 DIAGNOSIS — I82431 Acute embolism and thrombosis of right popliteal vein: Secondary | ICD-10-CM | POA: Insufficient documentation

## 2017-01-02 DIAGNOSIS — I10 Essential (primary) hypertension: Secondary | ICD-10-CM | POA: Diagnosis not present

## 2017-01-02 LAB — CBC WITH DIFFERENTIAL/PLATELET
BASOS ABS: 0.1 10*3/uL (ref 0–0.1)
BASOS PCT: 1 %
EOS ABS: 0.1 10*3/uL (ref 0–0.7)
EOS PCT: 1 %
HCT: 38.4 % (ref 35.0–47.0)
Hemoglobin: 13 g/dL (ref 12.0–16.0)
Lymphocytes Relative: 24 %
Lymphs Abs: 1.7 10*3/uL (ref 1.0–3.6)
MCH: 28.5 pg (ref 26.0–34.0)
MCHC: 33.8 g/dL (ref 32.0–36.0)
MCV: 84.5 fL (ref 80.0–100.0)
MONO ABS: 0.5 10*3/uL (ref 0.2–0.9)
Monocytes Relative: 8 %
Neutro Abs: 4.9 10*3/uL (ref 1.4–6.5)
Neutrophils Relative %: 66 %
PLATELETS: 320 10*3/uL (ref 150–440)
RBC: 4.55 MIL/uL (ref 3.80–5.20)
RDW: 29 % — AB (ref 11.5–14.5)
WBC: 7.3 10*3/uL (ref 3.6–11.0)

## 2017-01-02 LAB — PROTIME-INR
INR: 1.29
Prothrombin Time: 16.2 seconds — ABNORMAL HIGH (ref 11.4–15.2)

## 2017-01-02 NOTE — Telephone Encounter (Signed)
Son came upstairs and said that his mother had blood work ordered for our cancer center and also for Dr. Kary Kos. Everytime that pt goes to Dr. Kary Kos they have a hard time sticking her. He wondered if she could get PT/INr here today and send to Dr. Kary Kos. I called Janese Banks and she said it is ok. I called [place] office and spoek to Amy and she said that it was ok and  I agreed to fax results to their office. I told son that we could do it and that I would fax results to [facility] office. I also spoke to son and Amy at [facility] office about it this comes up again could we add PT/ INR on and she said that if it is the same day or 1 day different yes.  Son will call in future to let me know.

## 2017-01-04 ENCOUNTER — Encounter: Payer: Self-pay | Admitting: Podiatry

## 2017-01-04 ENCOUNTER — Ambulatory Visit (INDEPENDENT_AMBULATORY_CARE_PROVIDER_SITE_OTHER): Payer: Medicare Other | Admitting: Podiatry

## 2017-01-04 DIAGNOSIS — B351 Tinea unguium: Secondary | ICD-10-CM | POA: Diagnosis not present

## 2017-01-04 DIAGNOSIS — M79676 Pain in unspecified toe(s): Secondary | ICD-10-CM

## 2017-01-04 DIAGNOSIS — L84 Corns and callosities: Secondary | ICD-10-CM

## 2017-01-04 NOTE — Progress Notes (Signed)
Complaint:  Visit Type: Patient returns to my office for continued preventative foot care services. Complaint: Patient states" my nails have grown long and thick and become painful to walk and wear shoes"  The patient presents for preventative foot care services. No changes to ROS  Podiatric Exam: Vascular: dorsalis pedis and posterior tibial pulses are palpable bilateral. Capillary return is immediate. Temperature gradient is WNL. Skin turgor WNL  Sensorium: Normal Semmes Weinstein monofilament test. Normal tactile sensation bilaterally. Nail Exam: Pt has thick disfigured discolored nails with subungual debris noted bilateral entire nail hallux through fifth toenails Ulcer Exam: There is no evidence of ulcer or pre-ulcerative changes or infection. Orthopedic Exam: Muscle tone and strength are WNL. No limitations in general ROM. No crepitus or effusions noted. Foot type and digits show no abnormalities. Bony prominences are unremarkable. Skin: No Porokeratosis. No infection or ulcers.  Asymptomatic callus right hallux.  Diagnosis:  Onychomycosis, , Pain in right toe, pain in left toes  Treatment & Plan Procedures and Treatment: Consent by patient was obtained for treatment procedures. The patient understood the discussion of treatment and procedures well. All questions were answered thoroughly reviewed. Debridement of mycotic and hypertrophic toenails, 1 through 5 bilateral and clearing of subungual debris. No ulceration, no infection noted. Recommended O'Keefe cream. Return Visit-Office Procedure: Patient instructed to return to the office for a follow up visit 3 months for continued evaluation and treatment.    Gardiner Barefoot DPM

## 2017-01-12 ENCOUNTER — Telehealth: Payer: Self-pay | Admitting: *Deleted

## 2017-01-12 NOTE — Telephone Encounter (Signed)
I called Dr. Barbarann Ehlers office and spoke to [place] and told her that he was frustrated because the INR not getting to the therapeutic level. She states that the doctor has been adjusting it each time but it sometimes takes several months to get it where it needs to be.  She will send message to Dr. Kary Kos about son concerns that she is not therapeutic. I called pt back and left him a message that I did check with PCP office and they are working on her and sometimes it takes several months to get it in therapeutic range.  Sometimes medications can affect it as well as green leafy vegetable will affect the level going up because green leafy vegetables has vit. B in which could thicken the blood. I suggested to son to call and talk to PCP and see there are any other suggestions to help.  I also alerted Dr. Barbarann Ehlers office that you are concerned that the labs values has not achieved therapeutic level yet. Told him he could call back with any further questions but suggest to call PCP to discuss this issue.  I did tell him on message that Dr. Janese Banks was not in country and will not return til 8/27. The on call md suggested to call PCP office and ask them the concerns he has.

## 2017-01-12 NOTE — Telephone Encounter (Signed)
Son Ed called to ask for help adjusting warfarin. He reports that her PCP is not regulating it correctly. Her last few INR have been 1.5, 1.3, and most recently 1.2 when it should be 2.0. Please advise.   This a patient of Dr Janese Banks she sees for IDA and her PCP is managing Coumadin

## 2017-01-23 ENCOUNTER — Other Ambulatory Visit: Payer: Self-pay | Admitting: *Deleted

## 2017-01-23 ENCOUNTER — Inpatient Hospital Stay (HOSPITAL_BASED_OUTPATIENT_CLINIC_OR_DEPARTMENT_OTHER): Payer: Medicare Other | Admitting: Oncology

## 2017-01-23 ENCOUNTER — Inpatient Hospital Stay: Payer: Medicare Other

## 2017-01-23 ENCOUNTER — Encounter: Payer: Self-pay | Admitting: Oncology

## 2017-01-23 ENCOUNTER — Telehealth: Payer: Self-pay | Admitting: *Deleted

## 2017-01-23 VITALS — BP 98/68 | HR 65 | Temp 98.6°F | Resp 20 | Ht 65.0 in | Wt 153.0 lb

## 2017-01-23 DIAGNOSIS — D509 Iron deficiency anemia, unspecified: Secondary | ICD-10-CM

## 2017-01-23 DIAGNOSIS — I10 Essential (primary) hypertension: Secondary | ICD-10-CM

## 2017-01-23 DIAGNOSIS — H9190 Unspecified hearing loss, unspecified ear: Secondary | ICD-10-CM

## 2017-01-23 DIAGNOSIS — Z7901 Long term (current) use of anticoagulants: Secondary | ICD-10-CM

## 2017-01-23 DIAGNOSIS — Z853 Personal history of malignant neoplasm of breast: Secondary | ICD-10-CM | POA: Diagnosis not present

## 2017-01-23 DIAGNOSIS — K922 Gastrointestinal hemorrhage, unspecified: Secondary | ICD-10-CM | POA: Diagnosis not present

## 2017-01-23 DIAGNOSIS — R0602 Shortness of breath: Secondary | ICD-10-CM

## 2017-01-23 DIAGNOSIS — I82431 Acute embolism and thrombosis of right popliteal vein: Secondary | ICD-10-CM | POA: Diagnosis not present

## 2017-01-23 DIAGNOSIS — Z79899 Other long term (current) drug therapy: Secondary | ICD-10-CM | POA: Diagnosis not present

## 2017-01-23 DIAGNOSIS — K449 Diaphragmatic hernia without obstruction or gangrene: Secondary | ICD-10-CM

## 2017-01-23 DIAGNOSIS — Z800 Family history of malignant neoplasm of digestive organs: Secondary | ICD-10-CM

## 2017-01-23 DIAGNOSIS — Z85038 Personal history of other malignant neoplasm of large intestine: Secondary | ICD-10-CM | POA: Diagnosis not present

## 2017-01-23 DIAGNOSIS — Z806 Family history of leukemia: Secondary | ICD-10-CM

## 2017-01-23 DIAGNOSIS — Z9012 Acquired absence of left breast and nipple: Secondary | ICD-10-CM

## 2017-01-23 DIAGNOSIS — Z801 Family history of malignant neoplasm of trachea, bronchus and lung: Secondary | ICD-10-CM

## 2017-01-23 DIAGNOSIS — D500 Iron deficiency anemia secondary to blood loss (chronic): Secondary | ICD-10-CM

## 2017-01-23 LAB — CBC WITH DIFFERENTIAL/PLATELET
BASOS ABS: 0.1 10*3/uL (ref 0–0.1)
BASOS PCT: 1 %
EOS ABS: 0.1 10*3/uL (ref 0–0.7)
EOS PCT: 1 %
HCT: 41.8 % (ref 35.0–47.0)
Hemoglobin: 14 g/dL (ref 12.0–16.0)
Lymphocytes Relative: 26 %
Lymphs Abs: 2.2 10*3/uL (ref 1.0–3.6)
MCH: 29 pg (ref 26.0–34.0)
MCHC: 33.6 g/dL (ref 32.0–36.0)
MCV: 86.2 fL (ref 80.0–100.0)
MONO ABS: 0.6 10*3/uL (ref 0.2–0.9)
Monocytes Relative: 7 %
Neutro Abs: 5.5 10*3/uL (ref 1.4–6.5)
Neutrophils Relative %: 65 %
PLATELETS: 293 10*3/uL (ref 150–440)
RBC: 4.85 MIL/uL (ref 3.80–5.20)
RDW: 26.1 % — AB (ref 11.5–14.5)
WBC: 8.5 10*3/uL (ref 3.6–11.0)

## 2017-01-23 LAB — PROTIME-INR
INR: 2.83
PROTHROMBIN TIME: 30.3 s — AB (ref 11.4–15.2)

## 2017-01-23 NOTE — Progress Notes (Signed)
Patient here for follow up. She has had an adjustment to her warfarin this past week.

## 2017-01-23 NOTE — Telephone Encounter (Signed)
I called the office and got secretary and I told her that pt came today and her PT/INR was drawn because son said that she was suppose to come today to their office and get it checked.  Per Network engineer she states that pt does not have a lab ordered for this week. I told her that we did it based on it was due and we did it and if she would please tell amy that it was done.  I did talk to son and he said that the office called his sister on Monday and told her to dec. Coumadin to 5 mg from 7.5 mg and to come wed. And have the level rechecked.  I did fax the results when they were done.

## 2017-01-23 NOTE — Progress Notes (Signed)
Hematology/Oncology Consult note [facility]  Telephone:([phone] [phone]  Patient Care Team: [name], MD as PCP - General (Family Medicine) Bary Castilla Forest Gleason, MD as Consulting Physician (General Surgery) [name], MD as Referring Physician (Family Medicine)   Name of the patient: [name]  [id number]  [birth year]   Date of visit: 01/23/17  Diagnosis- 1. iron deficiency anemia due to obscure GI bleeding  2. RLE DVT  Chief complaint/ Reason for visit- routine follow-up  Heme/Onc history: patient is a 83 year old female with severe iron deficiency anemia. In March 2018 she was admitted for GI bleeding and underwent EGD and colonoscopy. EGD showed mild esophagitis and a large hiatal hernia. Colonoscopy was essentially normal except for an small polyp. She was given blood transfusion and was discharged on oral iron. She has not been able to tolerate oral iron. She was readmitted to the hospital in June 2018 again with severe iron deficiency anemia. She received blood transfusion as well as IV iron. She has not had small bowel capsule study yet and she is 88 and there were concerns anemia and didn't find evidence of bleeding on capsule study she would not be able to tolerate a push enteroscopy. She has therefore been referred to me for IV iron. She has had urinalysis in the past which has been negative for hematuria. Most recent CBC from 11/28/2016 showed white count of 8.2, H&H of 7.6/24.4 with an MCV of 73.8 and a platelet count of 359  Patient reports dark stools but denies any melanotic tarry stools or bright red blood per rectum. Denies any nosebleeds or gum bleeds. Denies any consistent use of NSAIDs. She does have gradually worsening shortness of breath as well as leg swelling over the last few months. She is independent of her ADLs  Patient received 2 doses of feraheme in July 2018. She then presented to her pcp with RLE pain and  swelling. She underwent doppler of RLE which showed: Positive for deep venous thrombosis of the right popliteal vein, and some of the right calf veins.  Given concern for GI bleed patient was started on prophylactic dose of lovenox and then transitioned to coumadin which is managed by PCP Dr. Kary Kos.   Interval history- Patient reports pain and swelling in her right leg has gone down and has less pain. She is taking coumadin. Denies any melena or bleeding in stool. She says that since receiving the iron infusion she feels 'great', has ample energy and has returned to doing many of her usual activities. She continues to use a cane for ambulation and is accompanied by her son today.   ECOG PS- 2 Pain scale- 0  Review of systems- Review of Systems  Constitutional: Negative for chills, fever, malaise/fatigue and weight loss.  HENT: Negative for congestion, ear discharge and nosebleeds.   Eyes: Negative for blurred vision.  Respiratory: Negative for cough, hemoptysis, sputum production, shortness of breath and wheezing.   Cardiovascular: Negative for chest pain, palpitations, orthopnea and claudication.  Gastrointestinal: Negative for abdominal pain, blood in stool, constipation, diarrhea, heartburn, melena, nausea and vomiting.  Genitourinary: Negative for dysuria, flank pain, frequency, hematuria and urgency.  Musculoskeletal: Negative for back pain, joint pain and myalgias. Right leg swelling & mild discomfort Skin: Negative for rash.  Neurological: Negative for dizziness, tingling, focal weakness, seizures, weakness and headaches.  Endo/Heme/Allergies: Does not bruise/bleed easily.  Psychiatric/Behavioral: Negative for depression and suicidal ideas. The patient does not have insomnia.  Allergies  Allergen Reactions  . Codeine Nausea And Vomiting   Past Medical History:  Diagnosis Date  . Allergy   . Arthritis    back  . Benign neoplasm of skin, site unspecified   . Bowel trouble    . Breast cancer ([place])   . Cancer ([place])    colon 20 years ago  . Cancer of breast [facility]) April 10,.2013   Left breast, 3.8 cm, 4 cm axillary node, T2, N1a, ER negative PR negative, HER-2/neu not amplified.  . Cataract   . Colon cancer ([place])   . Hard of hearing   . Heartburn   . Hiatal hernia   . History of stomach ulcers   . History of stomach ulcers   . Hypertension 2012  . Personal history of malignant neoplasm of breast 2013   LEFT MASTECTOMY,left modified radical mastectomy on September 06, 2011 483.8 cm primary tumor with a 4.6 cm axillary metastasis. 3 of 13 nodes were positive. T2,N1a lesion. This was a triple negative lesion  . Shingles Dec 2015  . Vaginal atrophy 08/10/2015   Past Surgical History:  Procedure Laterality Date  . ABDOMINAL HYSTERECTOMY     42 YEARS AGO  . BREAST SURGERY Left 09-06-11   left modified radical mastectomy on September 06, 2011 483.8 cm primary tumor with a 4.6 cm axillary metastasis. 3 of 13 nodes were positive. T2,N1a lesion. This was a triple negative lesion  . CARPAL TUNNEL RELEASE Right September 25, 2014   Skip Estimable, M.D.  . COLON RESECTION  1985  . COLON SURGERY  20 YEARS AGO  . COLONOSCOPY  2012   DR. ELLIOTT  . COLONOSCOPY WITH PROPOFOL N/A 08/25/2016   Procedure: COLONOSCOPY WITH PROPOFOL;  Surgeon: [name], MD;  Location: [facility] ENDOSCOPY;  Service: Gastroenterology;  Laterality: N/A;  . ESOPHAGOGASTRODUODENOSCOPY (EGD) WITH PROPOFOL N/A 08/23/2016   Procedure: ESOPHAGOGASTRODUODENOSCOPY (EGD) WITH PROPOFOL;  Surgeon: [name], MD;  Location: [facility] ENDOSCOPY;  Service: Endoscopy;  Laterality: N/A;  . MASTECTOMY Left 2013   left modified radical mastectomy on September 06, 2011 Stage 2; 3.8 cm primary tumor with a 4.6 cm axillary metastasis. 3 of 13 nodes were positive. T2,N1a lesion. This was a triple negative lesion  . UPPER GI ENDOSCOPY  2012   BLEEDING   Social History   Social History  . Marital status: Widowed    Spouse name: N/A  .  Number of children: N/A  . Years of education: N/A   Occupational History  . Not on file.   Social History Main Topics  . Smoking status: Never Smoker  . Smokeless tobacco: Never Used  . Alcohol use No  . Drug use: No  . Sexual activity: No   Other Topics Concern  . Not on file   Social History Narrative  . No narrative on file   Family History  Problem Relation Age of Onset  . Lung cancer Brother        colono ca  . Cancer Brother   . Skin cancer Daughter   . Leukemia Father   . Cancer Father   . Colon cancer Mother   . Cancer Mother   . Skin cancer Son   . Kidney disease Neg Hx   . Bladder Cancer Neg Hx     Current Outpatient Prescriptions:  .  acetaminophen (TYLENOL) 650 MG CR tablet, Take by mouth., Disp: , Rfl:  .  colestipol (COLESTID) 1 g tablet, Take 1 tablet by mouth 2 (two) times  daily., Disp: , Rfl:  .  Cranberry 500 MG CAPS, Take by mouth daily., Disp: , Rfl:  .  estradiol (ESTRACE VAGINAL) 0.1 MG/GM vaginal cream, Apply 0.12m (pea-sized amount)  just inside the vaginal introitus with a finger-tip every night for two weeks and then Monday, Wednesday and Friday nights., Disp: 30 g, Rfl: 12 .  gabapentin (NEURONTIN) 100 MG capsule, Take on tablet each night for two weeks. Increase to two tablets nightly if no improvement., Disp: 90 capsule, Rfl: 6 .  lansoprazole (PREVACID) 30 MG capsule, Take 1 capsule (30 mg total) by mouth daily at 12 noon., Disp: 60 capsule, Rfl: 1 .  loperamide (IMODIUM A-D) 2 MG tablet, Take 2 mg by mouth as needed for diarrhea or loose stools., Disp: , Rfl:  .  metoprolol succinate (TOPROL-XL) 25 MG 24 hr tablet, Take 25 mg by mouth daily., Disp: , Rfl:  .  OXYQUINOLONE SULFATE VAGINAL 0.025 % GEL, Place 1 Applicatorful vaginally 2 (two) times a week., Disp: 1 Tube, Rfl: 3 .  pantoprazole (PROTONIX) 40 MG tablet, Take 1 tablet (40 mg total) by mouth daily., Disp: 90 tablet, Rfl: 3 .  Potassium Gluconate 595 MG CAPS, Take 1 capsule by  mouth daily. , Disp: , Rfl:  .  Probiotic Product (ALIGN) 4 MG CAPS, Take 1 capsule by mouth daily. , Disp: , Rfl:  .  temazepam (RESTORIL) 15 MG capsule, Take 15 mg by mouth at bedtime., Disp: , Rfl:  .  triamcinolone (NASACORT ALLERGY 24HR) 55 MCG/ACT AERO nasal inhaler, Place 2 sprays into the nose as needed. , Disp: , Rfl:  .  vitamin B-12 (CYANOCOBALAMIN) 1000 MCG tablet, Take 1,000 mcg by mouth daily., Disp: , Rfl:  .  warfarin (COUMADIN) 5 MG tablet, Take by mouth., Disp: , Rfl:   Physical exam:  Vitals:   01/23/17 1345 01/23/17 1352  BP:  98/68  Pulse:  65  Resp:  20  Temp:  98.6 F (37 C)  TempSrc:  Tympanic  Weight: 153 lb (69.4 kg)   Height: '5\' 5"'  (1.651 m)    Physical Exam  Constitutional: She is oriented to person, place, and time.  Elderly lady in no acute distress , accompanied by her son HENT: Hard of hearing & wears hearing aids Head: Normocephalic and atraumatic.  Eyes: Pupils are equal, round, and reactive to light. EOM are normal.  Neck: Normal range of motion.  Cardiovascular: Normal rate, regular rhythm and normal heart sounds.   Pulmonary/Chest: Effort normal and breath sounds normal.  Abdominal: Soft. Bowel sounds are normal.  Musculoskeletal: RLE mildly erythematous but reduced compared to previous visits, warm, pink/red Neurological: She is alert and oriented to person, place, and time.  Skin: Skin is warm and dry.     CMP Latest Ref Rng & Units 11/27/2016  Glucose 65 - 99 mg/dL 95  BUN 6 - 20 mg/dL 9  Creatinine 0.44 - 1.00 mg/dL 0.63  Sodium 135 - 145 mmol/L 138  Potassium 3.5 - 5.1 mmol/L 3.7  Chloride 101 - 111 mmol/L 107  CO2 22 - 32 mmol/L 25  Calcium 8.9 - 10.3 mg/dL 8.0(L)  Total Protein 6.4 - 8.2 g/dL -  Total Bilirubin 0.2 - 1.0 mg/dL -  Alkaline Phos 50 - 136 Unit/L -  AST 15 - 37 Unit/L -  ALT 12 - 78 U/L -   CBC Latest Ref Rng & Units 01/23/2017  WBC 3.6 - 11.0 K/uL 8.5  Hemoglobin 12.0 - 16.0 g/dL 14.0  Hematocrit 35.0 - 47.0 %  41.8  Platelets 150 - 440 K/uL 293    No images are attached to the encounter.  No results found.   Assessment and plan- Patient is a 83 y.o. female who sees me for following issues:  1. Iron deficiency anemia secondary to GI bleeding and iron deficiency. Last Feraheme infusion on 12/11/16 significantly improved her symptoms and her H/H has continued to rise to today's level of 14.0/41.8. Will repeat CBC and Iron studies in 6 weeks then CBC in 12 weeks and plan to re-evaluate her at that time for Feraheme.   2. RLE DVT- Patient has right popliteal vein DVT. May have been provoked during recent hospitalization r/t lack of mobility during that admission. 3 months of anticoagulation were recommended as long as her H/H continues to be stable and she does not show any indication of bleeding. If patient were to develop GI bleed as she has in the past, coumadin would need to be stopped and an IVC filter should be considered. Previously risks and benefits of lovenox versus coumadin versus NOACs were discussed and patient stated she had preferred pills. PCP is managing her INR and coumadin. Recommendation is to keep INR between 2-3. Per patient request, INR was checked to day and was 2.83 compared to 1.29 3 weeks ago. Will send this result to Dr. Kary Kos for continued management.   Total face to face encounter time for this patient visit was 30 min. >50% of the time was  spent in counseling and coordination of care.     Visit Diagnosis 1. Iron deficiency anemia, unspecified iron deficiency anemia type   2. Acute deep vein thrombosis (DVT) of popliteal vein of right lower extremity (HCC)      Dr. Randa Evens, MD, MPH [place] [facility] Pager- [phone] 01/23/2017 2:52 PM

## 2017-01-24 ENCOUNTER — Encounter: Payer: Self-pay | Admitting: Gastroenterology

## 2017-01-24 ENCOUNTER — Ambulatory Visit (INDEPENDENT_AMBULATORY_CARE_PROVIDER_SITE_OTHER): Payer: Medicare Other | Admitting: Gastroenterology

## 2017-01-24 VITALS — BP 153/81 | HR 75 | Ht 65.0 in | Wt 155.4 lb

## 2017-01-24 DIAGNOSIS — K921 Melena: Secondary | ICD-10-CM | POA: Diagnosis not present

## 2017-01-24 NOTE — Progress Notes (Signed)
[name] MD, MRCP(U.K) [address]  [place]  [place], [place] [address]  Main: [phone]  Fax: [phone]   Primary Care Physician: [name], MD  Primary Gastroenterologist:  Dr. [name]   Chief Complaint  Patient presents with  . Follow-up    HPI: [name] is a 83 y.o. female    Summary of history :Here to follow up for iron deficiency anemia   She was admitted on 08/21/16 for a GI bleed. She used to suffer from chronic diarrhea and see Dr Tiffany Kocher in the past . She also has a history of GERD , colon cancer and breast cancer . On admission she was noted to be short of breath, have melena, acute drop in HB , microcytic anemia. She had been taking NSAID on a regular basis for a few weeks prior to the event. I performed an EGD and found mild esophagitis, large hiatal hernia . I followed it up with a colonoscopy which was essentially normal except for a small polyp.  Interval history   09/2016-  01/24/2017   She was admitted in 10/2016 with anemia, at that time she was on oral iron and wasn't able to tolerate it. Sees bDr Janese Banks in hematology and has since began to get IV iron .Recently developed DVT of RLE, commenced on Lovenox  CBC Latest Ref Rng & Units 01/23/2017 01/02/2017 12/19/2016  WBC 3.6 - 11.0 K/uL 8.5 7.3 7.5  Hemoglobin 12.0 - 16.0 g/dL 14.0 13.0 12.1  Hematocrit 35.0 - 47.0 % 41.8 38.4 37.2  Platelets 150 - 440 K/uL 293 320 311   Doing well no overt blood loss.     Current Outpatient Prescriptions  Medication Sig Dispense Refill  . acetaminophen (TYLENOL) 650 MG CR tablet Take by mouth.    . colestipol (COLESTID) 1 g tablet Take 1 tablet by mouth 2 (two) times daily.    . Cranberry 500 MG CAPS Take by mouth daily.    Marland Kitchen gabapentin (NEURONTIN) 100 MG capsule Take on tablet each night for two weeks. Increase to two tablets nightly if no improvement. 90 capsule 6  . loperamide (IMODIUM A-D) 2 MG tablet Take 2 mg by mouth as needed for diarrhea  or loose stools.    . metoprolol succinate (TOPROL-XL) 25 MG 24 hr tablet Take 25 mg by mouth daily.    Levin Erp SULFATE VAGINAL 0.025 % GEL Place 1 Applicatorful vaginally 2 (two) times a week. 1 Tube 3  . pantoprazole (PROTONIX) 40 MG tablet Take 1 tablet (40 mg total) by mouth daily. 90 tablet 3  . Potassium Gluconate 595 MG CAPS Take 1 capsule by mouth daily.     . Probiotic Product (ALIGN) 4 MG CAPS Take 1 capsule by mouth daily.     . temazepam (RESTORIL) 15 MG capsule Take 15 mg by mouth at bedtime.    . triamcinolone (NASACORT ALLERGY 24HR) 55 MCG/ACT AERO nasal inhaler Place 2 sprays into the nose as needed.     . vitamin B-12 (CYANOCOBALAMIN) 1000 MCG tablet Take 1,000 mcg by mouth daily.    Marland Kitchen warfarin (COUMADIN) 5 MG tablet Take by mouth.    . estradiol (ESTRACE VAGINAL) 0.1 MG/GM vaginal cream Apply 0.5mg  (pea-sized amount)  just inside the vaginal introitus with a finger-tip every night for two weeks and then Monday, Wednesday and Friday nights. (Patient not taking: Reported on 01/24/2017) 30 g 12   No current facility-administered medications for this visit.  Allergies as of 01/24/2017 - Review Complete 01/24/2017  Allergen Reaction Noted  . Codeine Nausea And Vomiting 07/27/2012    ROS:  General: Negative for anorexia, weight loss, fever, chills, fatigue, weakness. ENT: Negative for hoarseness, difficulty swallowing , nasal congestion. CV: Negative for chest pain, angina, palpitations, dyspnea on exertion, peripheral edema.  Respiratory: Negative for dyspnea at rest, dyspnea on exertion, cough, sputum, wheezing.  GI: See history of present illness. GU:  Negative for dysuria, hematuria, urinary incontinence, urinary frequency, nocturnal urination.  Endo: Negative for unusual weight change.    Physical Examination:   BP (!) 153/81 (BP Location: Right Arm, Patient Position: Sitting, Cuff Size: Normal)   Pulse 75   Ht 5\' 5"  (1.651 m)   Wt 155 lb 6.4 oz (70.5 kg)    BMI 25.86 kg/m   General: Well-nourished, well-developed in no acute distress.  Eyes: No icterus. Conjunctivae pink. Mouth: Oropharyngeal mucosa moist and pink , no lesions erythema or exudate. Lungs: Clear to auscultation bilaterally. Non-labored. Heart: Regular rate and rhythm, no murmurs rubs or gallops.  Abdomen: Bowel sounds are normal, nontender, nondistended, no hepatosplenomegaly or masses, no abdominal bruits or hernia , no rebound or guarding.    Extremities: No lower extremity edema. No clubbing or deformities. Neuro: Alert and oriented x 3.  Grossly intact. Skin: Warm and dry, no jaundice.   Psych: Alert and cooperative, normal mood and affect.   Imaging Studies: No results found.  Assessment and Plan:   [name] is a 83 y.o. y/o femalehere to follow up from hospital discharge for Iron deficiency anemia with prior long term use of NSAID's Very likely the anemia was from chronic NSAID use. EGD+colonoscopy was negative for source of blood loss. We have not done a capsule study so far as she wouldn't really want a small bowel enteroscopy , presently plan is to closely follow her Hb and if there is a concern for ongoing bleed and if we have no other option then to consider capsule study   Plan  1. She will continue to follow up with Hematology who will monitor her CBC 2. Continue Protonix at present dose as Zantac did not help with her symptoms   Dr [name]  MD,MRCP [facility]) Follow up as needed

## 2017-01-31 ENCOUNTER — Ambulatory Visit (INDEPENDENT_AMBULATORY_CARE_PROVIDER_SITE_OTHER): Payer: Medicare Other | Admitting: Obstetrics and Gynecology

## 2017-01-31 VITALS — BP 196/94 | HR 90 | Ht 65.0 in | Wt 155.5 lb

## 2017-01-31 DIAGNOSIS — Z87440 Personal history of urinary (tract) infections: Secondary | ICD-10-CM | POA: Diagnosis not present

## 2017-01-31 DIAGNOSIS — R339 Retention of urine, unspecified: Secondary | ICD-10-CM | POA: Diagnosis not present

## 2017-01-31 DIAGNOSIS — N952 Postmenopausal atrophic vaginitis: Secondary | ICD-10-CM | POA: Diagnosis not present

## 2017-01-31 DIAGNOSIS — Z4689 Encounter for fitting and adjustment of other specified devices: Secondary | ICD-10-CM | POA: Diagnosis not present

## 2017-01-31 DIAGNOSIS — N8111 Cystocele, midline: Secondary | ICD-10-CM

## 2017-01-31 NOTE — Progress Notes (Signed)
    GYNECOLOGY PROGRESS NOTE  Subjective:    Patient ID: [name], female    DOB: 11-Apr-1921, 83 y.o.   MRN: [id number]  Urinary Tract Infection      Patient is a 83 y.o. [id number] female who presents for pessary check.   Denies vaginal bleeding or abnormal discharge.  She denies difficulty voiding or passing stools.   Notes that the pessary is working pretty well for her.    The following portions of the patient's history were reviewed and updated as appropriate: allergies, current medications, past family history, past medical history, past social history, past surgical history and problem list.  Review of Systems A comprehensive review of systems was negative except for: feeling "not quite right" today.  Notes feeling drained, fatigued. Thinks she may have another UTI. Has been taking Cranberry tablets for 2-3 days.   Objective:   Blood pressure (!) 196/94, pulse 90, height 5\' 5"  (1.651 m), weight 155 lb 8 oz (70.5 kg). General appearance: alert and no distress Abdomen: soft, non-tender; bowel sounds normal; no masses,  no organomegaly.  Pelvic:  The patient's size 5 pessary was removed, cleaned and replaced without complications.  Speculum examination revealed normal vaginal mucosa with no lesions or lacerations   Assessment:   Pessary maintenance H/o incomplete bladder emptying H/o recurrent UTIs.  Cystocele with vaginal vault prolapse Vaginal atrophy (moderate)  HTN (uncontrolled)  Plan:    - Pessary cleaned today.  The patient should return in 10-12 weeks for a pessary check.  Continue to use Trimo-San gel internally once or twice weekly, and continue to use Premarin cream for external use near urethral meatus for h/o recurrent UTIs.   - HTN uncontrolled.  Patient thinks she forgot to take her BP meds this morning as she has had several doctor appointments today.  Encouraged to take as soon as possible.    Rubie Maid, MD [facility]

## 2017-02-03 DIAGNOSIS — Z87440 Personal history of urinary (tract) infections: Secondary | ICD-10-CM | POA: Insufficient documentation

## 2017-02-03 DIAGNOSIS — Z960 Presence of urogenital implants: Secondary | ICD-10-CM | POA: Insufficient documentation

## 2017-03-06 ENCOUNTER — Inpatient Hospital Stay: Payer: Medicare Other | Attending: Oncology

## 2017-03-06 DIAGNOSIS — D500 Iron deficiency anemia secondary to blood loss (chronic): Secondary | ICD-10-CM | POA: Diagnosis not present

## 2017-03-06 DIAGNOSIS — K922 Gastrointestinal hemorrhage, unspecified: Secondary | ICD-10-CM | POA: Diagnosis not present

## 2017-03-06 DIAGNOSIS — D509 Iron deficiency anemia, unspecified: Secondary | ICD-10-CM

## 2017-03-06 LAB — CBC WITH DIFFERENTIAL/PLATELET
Basophils Absolute: 0.1 10*3/uL (ref 0–0.1)
Basophils Relative: 1 %
Eosinophils Absolute: 0 10*3/uL (ref 0–0.7)
Eosinophils Relative: 1 %
HEMATOCRIT: 43.5 % (ref 35.0–47.0)
Hemoglobin: 14.4 g/dL (ref 12.0–16.0)
LYMPHS PCT: 21 %
Lymphs Abs: 2 10*3/uL (ref 1.0–3.6)
MCH: 30.1 pg (ref 26.0–34.0)
MCHC: 33.2 g/dL (ref 32.0–36.0)
MCV: 90.7 fL (ref 80.0–100.0)
MONO ABS: 0.6 10*3/uL (ref 0.2–0.9)
MONOS PCT: 7 %
NEUTROS ABS: 6.7 10*3/uL — AB (ref 1.4–6.5)
Neutrophils Relative %: 70 %
Platelets: 307 10*3/uL (ref 150–440)
RBC: 4.8 MIL/uL (ref 3.80–5.20)
RDW: 15.9 % — AB (ref 11.5–14.5)
WBC: 9.5 10*3/uL (ref 3.6–11.0)

## 2017-03-06 LAB — IRON AND TIBC
IRON: 57 ug/dL (ref 28–170)
Saturation Ratios: 16 % (ref 10.4–31.8)
TIBC: 347 ug/dL (ref 250–450)
UIBC: 290 ug/dL

## 2017-03-06 LAB — FERRITIN: Ferritin: 34 ng/mL (ref 11–307)

## 2017-04-12 ENCOUNTER — Ambulatory Visit: Payer: Medicare Other | Admitting: Podiatry

## 2017-04-17 ENCOUNTER — Other Ambulatory Visit: Payer: Self-pay

## 2017-04-17 ENCOUNTER — Encounter: Payer: Self-pay | Admitting: Oncology

## 2017-04-17 ENCOUNTER — Inpatient Hospital Stay (HOSPITAL_BASED_OUTPATIENT_CLINIC_OR_DEPARTMENT_OTHER): Payer: Medicare Other | Admitting: Oncology

## 2017-04-17 ENCOUNTER — Inpatient Hospital Stay: Payer: Medicare Other | Attending: Oncology

## 2017-04-17 VITALS — BP 158/86 | HR 77 | Wt 157.0 lb

## 2017-04-17 DIAGNOSIS — I82531 Chronic embolism and thrombosis of right popliteal vein: Secondary | ICD-10-CM | POA: Diagnosis not present

## 2017-04-17 DIAGNOSIS — K449 Diaphragmatic hernia without obstruction or gangrene: Secondary | ICD-10-CM | POA: Insufficient documentation

## 2017-04-17 DIAGNOSIS — Z853 Personal history of malignant neoplasm of breast: Secondary | ICD-10-CM

## 2017-04-17 DIAGNOSIS — I10 Essential (primary) hypertension: Secondary | ICD-10-CM | POA: Diagnosis not present

## 2017-04-17 DIAGNOSIS — R600 Localized edema: Secondary | ICD-10-CM | POA: Diagnosis not present

## 2017-04-17 DIAGNOSIS — M1990 Unspecified osteoarthritis, unspecified site: Secondary | ICD-10-CM | POA: Diagnosis not present

## 2017-04-17 DIAGNOSIS — R0602 Shortness of breath: Secondary | ICD-10-CM | POA: Insufficient documentation

## 2017-04-17 DIAGNOSIS — Z7901 Long term (current) use of anticoagulants: Secondary | ICD-10-CM | POA: Diagnosis not present

## 2017-04-17 DIAGNOSIS — Z801 Family history of malignant neoplasm of trachea, bronchus and lung: Secondary | ICD-10-CM | POA: Diagnosis not present

## 2017-04-17 DIAGNOSIS — Z808 Family history of malignant neoplasm of other organs or systems: Secondary | ICD-10-CM

## 2017-04-17 DIAGNOSIS — D500 Iron deficiency anemia secondary to blood loss (chronic): Secondary | ICD-10-CM | POA: Diagnosis not present

## 2017-04-17 DIAGNOSIS — Z800 Family history of malignant neoplasm of digestive organs: Secondary | ICD-10-CM | POA: Diagnosis not present

## 2017-04-17 DIAGNOSIS — Z9012 Acquired absence of left breast and nipple: Secondary | ICD-10-CM | POA: Diagnosis not present

## 2017-04-17 DIAGNOSIS — Z85038 Personal history of other malignant neoplasm of large intestine: Secondary | ICD-10-CM | POA: Diagnosis not present

## 2017-04-17 DIAGNOSIS — Z79899 Other long term (current) drug therapy: Secondary | ICD-10-CM | POA: Insufficient documentation

## 2017-04-17 DIAGNOSIS — Z8719 Personal history of other diseases of the digestive system: Secondary | ICD-10-CM | POA: Insufficient documentation

## 2017-04-17 DIAGNOSIS — K209 Esophagitis, unspecified: Secondary | ICD-10-CM | POA: Diagnosis not present

## 2017-04-17 LAB — CBC WITH DIFFERENTIAL/PLATELET
BASOS PCT: 1 %
Basophils Absolute: 0.1 10*3/uL (ref 0–0.1)
EOS ABS: 0.1 10*3/uL (ref 0–0.7)
EOS PCT: 1 %
HCT: 41.5 % (ref 35.0–47.0)
HEMOGLOBIN: 13.9 g/dL (ref 12.0–16.0)
Lymphocytes Relative: 23 %
Lymphs Abs: 2.2 10*3/uL (ref 1.0–3.6)
MCH: 31.7 pg (ref 26.0–34.0)
MCHC: 33.6 g/dL (ref 32.0–36.0)
MCV: 94.2 fL (ref 80.0–100.0)
MONO ABS: 0.8 10*3/uL (ref 0.2–0.9)
MONOS PCT: 8 %
NEUTROS PCT: 67 %
Neutro Abs: 6.3 10*3/uL (ref 1.4–6.5)
PLATELETS: 286 10*3/uL (ref 150–440)
RBC: 4.4 MIL/uL (ref 3.80–5.20)
RDW: 14.3 % (ref 11.5–14.5)
WBC: 9.4 10*3/uL (ref 3.6–11.0)

## 2017-04-17 LAB — IRON AND TIBC
Iron: 59 ug/dL (ref 28–170)
SATURATION RATIOS: 18 % (ref 10.4–31.8)
TIBC: 321 ug/dL (ref 250–450)
UIBC: 262 ug/dL

## 2017-04-17 LAB — FERRITIN: Ferritin: 57 ng/mL (ref 11–307)

## 2017-04-17 NOTE — Progress Notes (Signed)
Hematology/Oncology Consult note [facility]  [phone] [phone]  Patient Care Team: [name], MD as PCP - General (Family Medicine) Bary Castilla Forest Gleason, MD as Consulting Physician (General Surgery) [name], MD as Referring Physician (Family Medicine)   Name of the patient: [name]  [id number]  [birth year]   Date of visit: 04/17/17  Diagnosis- 1. iron deficiency anemia due to obscure GI bleeding  2. RLE DVT  Chief complaint/ Reason for visit- routine f/u of iron deficiency anemia and DVT  Heme/Onc history: patient is a 83 year old female with severe iron deficiency anemia. In March 2018 she was admitted for GI bleeding and underwent EGD and colonoscopy. EGD showed mild esophagitis and a large hiatal hernia. Colonoscopy was essentially normal except for an small polyp. She was given blood transfusion and was discharged on oral iron. She has not been able to tolerate oral iron. She was readmitted to the hospital in June 2018 again with severe iron deficiency anemia. She received blood transfusion as well as IV iron. She has not had smallbowel capsule study yet and she is 27 and there were concerns anemia and didn't find evidence of bleeding on capsule study she would not be able to tolerate a push enteroscopy. She has therefore been referred to me for IV iron. She has had urinalysis in the past which has been negative for hematuria. Most recent CBC from 11/28/2016 showed white count of 8.2, H&H of 7.6/24.4 with an MCV of 73.8 and a platelet count of 359  Patient reports dark stools but denies any melanotic tarry stools or bright red blood per rectum. Denies any nosebleeds or gum bleeds. Denies any consistent use of NSAIDs. She does have gradually worsening shortness of breath as well as leg swelling over the last few months. She is independent of her ADLs   patient received 2 doses of feraheme in July 2018. She then  presented to her pcp with RLE pain and swelling. She underwent doppler of RLE which showed: Positive for deep venous thrombosis of the right popliteal vein, and some of the right calf veins.  Patient was started on lovenox and then switched to coumadin and has completed 3 months of anticoagulation   Interval history- she is now off coumadin. RLE swelling has resolved. She denies any bleeding in stool or urine. Denies dark melanotic stools  ECOG PS- 1 Pain scale- 0   Review of systems- Review of Systems  Constitutional: Negative for chills, fever, malaise/fatigue and weight loss.  HENT: Negative for congestion, ear discharge and nosebleeds.   Eyes: Negative for blurred vision.  Respiratory: Negative for cough, hemoptysis, sputum production, shortness of breath and wheezing.   Cardiovascular: Negative for chest pain, palpitations, orthopnea and claudication.  Gastrointestinal: Negative for abdominal pain, blood in stool, constipation, diarrhea, heartburn, melena, nausea and vomiting.  Genitourinary: Negative for dysuria, flank pain, frequency, hematuria and urgency.  Musculoskeletal: Negative for back pain, joint pain and myalgias.  Skin: Negative for rash.  Neurological: Negative for dizziness, tingling, focal weakness, seizures, weakness and headaches.  Endo/Heme/Allergies: Does not bruise/bleed easily.  Psychiatric/Behavioral: Negative for depression and suicidal ideas. The patient does not have insomnia.       Allergies  Allergen Reactions  . Codeine Nausea And Vomiting     Past Medical History:  Diagnosis Date  . Allergy   . Arthritis    back  . Benign neoplasm of skin, site unspecified   . Bowel trouble   . Breast  cancer ([place])   . Cancer ([employer])    colon 20 years ago  . Cancer of breast [facility]) April 10,.2013   Left breast, 3.8 cm, 4 cm axillary node, T2, N1a, ER negative PR negative, HER-2/neu not amplified.  . Cataract   . Colon cancer ([place])   . Hard of hearing   .  Heartburn   . Hiatal hernia   . History of stomach ulcers   . History of stomach ulcers   . Hypertension 2012  . Personal history of malignant neoplasm of breast 2013   LEFT MASTECTOMY,left modified radical mastectomy on September 06, 2011 483.8 cm primary tumor with a 4.6 cm axillary metastasis. 3 of 13 nodes were positive. T2,N1a lesion. This was a triple negative lesion  . Shingles Dec 2015  . Vaginal atrophy 08/10/2015     Past Surgical History:  Procedure Laterality Date  . ABDOMINAL HYSTERECTOMY     42 YEARS AGO  . BREAST SURGERY Left 09-06-11   left modified radical mastectomy on September 06, 2011 483.8 cm primary tumor with a 4.6 cm axillary metastasis. 3 of 13 nodes were positive. T2,N1a lesion. This was a triple negative lesion  . CARPAL TUNNEL RELEASE Right September 25, 2014   Skip Estimable, M.D.  . COLON RESECTION  1985  . COLON SURGERY  20 YEARS AGO  . COLONOSCOPY  2012   DR. ELLIOTT  . COLONOSCOPY WITH PROPOFOL N/A 08/25/2016   Performed by [name], MD at [place]  . ESOPHAGOGASTRODUODENOSCOPY (EGD) WITH PROPOFOL N/A 08/23/2016   Performed by [name], MD at [place]  . MASTECTOMY Left 2013   left modified radical mastectomy on September 06, 2011 Stage 2; 3.8 cm primary tumor with a 4.6 cm axillary metastasis. 3 of 13 nodes were positive. T2,N1a lesion. This was a triple negative lesion  . UPPER GI ENDOSCOPY  2012   BLEEDING    Social History   Socioeconomic History  . Marital status: Widowed    Spouse name: Not on file  . Number of children: Not on file  . Years of education: Not on file  . Highest education level: Not on file  Social Needs  . Financial resource strain: Not on file  . Food insecurity - worry: Not on file  . Food insecurity - inability: Not on file  . Transportation needs - medical: Not on file  . Transportation needs - non-medical: Not on file  Occupational History  . Not on file  Tobacco Use  . Smoking status: Never Smoker  .  Smokeless tobacco: Never Used  Substance and Sexual Activity  . Alcohol use: No  . Drug use: No  . Sexual activity: No    Birth control/protection: None  Other Topics Concern  . Not on file  Social History Narrative  . Not on file    Family History  Problem Relation Age of Onset  . Lung cancer Brother        colono ca  . Cancer Brother   . Skin cancer Daughter   . Leukemia Father   . Cancer Father   . Colon cancer Mother   . Cancer Mother   . Skin cancer Son   . Kidney disease Neg Hx   . Bladder Cancer Neg Hx      Current Outpatient Medications:  .  acetaminophen (TYLENOL) 650 MG CR tablet, Take by mouth., Disp: , Rfl:  .  colestipol (COLESTID) 1 g tablet, Take 1 tablet by mouth 2 (two)  times daily., Disp: , Rfl:  .  Cranberry 500 MG CAPS, Take by mouth daily., Disp: , Rfl:  .  estradiol (ESTRACE VAGINAL) 0.1 MG/GM vaginal cream, Apply 0.45m (pea-sized amount)  just inside the vaginal introitus with a finger-tip every night for two weeks and then Monday, Wednesday and Friday nights., Disp: 30 g, Rfl: 12 .  gabapentin (NEURONTIN) 100 MG capsule, Take on tablet each night for two weeks. Increase to two tablets nightly if no improvement., Disp: 90 capsule, Rfl: 6 .  lansoprazole (PREVACID) 30 MG capsule, take 1 capsule by mouth once daily AT 12 NOON, Disp: , Rfl: 0 .  loperamide (IMODIUM A-D) 2 MG tablet, Take 2 mg by mouth as needed for diarrhea or loose stools., Disp: , Rfl:  .  metoprolol succinate (TOPROL-XL) 25 MG 24 hr tablet, Take 25 mg by mouth daily., Disp: , Rfl:  .  OXYQUINOLONE SULFATE VAGINAL 0.025 % GEL, Place 1 Applicatorful vaginally 2 (two) times a week., Disp: 1 Tube, Rfl: 3 .  pantoprazole (PROTONIX) 40 MG tablet, Take 1 tablet (40 mg total) by mouth daily., Disp: 90 tablet, Rfl: 3 .  Potassium Gluconate 595 MG CAPS, Take 1 capsule by mouth daily. , Disp: , Rfl:  .  Probiotic Product (ALIGN) 4 MG CAPS, Take 1 capsule by mouth daily. , Disp: , Rfl:  .   temazepam (RESTORIL) 15 MG capsule, Take 15 mg by mouth at bedtime., Disp: , Rfl:  .  triamcinolone (NASACORT ALLERGY 24HR) 55 MCG/ACT AERO nasal inhaler, Place 2 sprays into the nose as needed. , Disp: , Rfl:  .  vitamin B-12 (CYANOCOBALAMIN) 1000 MCG tablet, Take 1,000 mcg by mouth daily., Disp: , Rfl:  .  warfarin (COUMADIN) 5 MG tablet, Take by mouth., Disp: , Rfl:   Physical exam:  Vitals:   04/17/17 1551  BP: (!) 158/86  Pulse: 77  Weight: 157 lb (71.2 kg)   Physical Exam  Constitutional: She is oriented to person, place, and time and well-developed, well-nourished, and in no distress.  HENT:  Head: Normocephalic and atraumatic.  Eyes: EOM are normal. Pupils are equal, round, and reactive to light.  Neck: Normal range of motion.  Cardiovascular: Normal rate, regular rhythm and normal heart sounds.  Pulmonary/Chest: Effort normal and breath sounds normal.  Abdominal: Soft. Bowel sounds are normal.  Musculoskeletal: She exhibits edema (trace R>L but RLE swelling significantly improved).  Neurological: She is alert and oriented to person, place, and time.  Skin: Skin is warm and dry.     CMP Latest Ref Rng & Units 11/27/2016  Glucose 65 - 99 mg/dL 95  BUN 6 - 20 mg/dL 9  Creatinine 0.44 - 1.00 mg/dL 0.63  Sodium 135 - 145 mmol/L 138  Potassium 3.5 - 5.1 mmol/L 3.7  Chloride 101 - 111 mmol/L 107  CO2 22 - 32 mmol/L 25  Calcium 8.9 - 10.3 mg/dL 8.0(L)  Total Protein 6.4 - 8.2 g/dL -  Total Bilirubin 0.2 - 1.0 mg/dL -  Alkaline Phos 50 - 136 Unit/L -  AST 15 - 37 Unit/L -  ALT 12 - 78 U/L -   CBC Latest Ref Rng & Units 04/17/2017  WBC 3.6 - 11.0 K/uL 9.4  Hemoglobin 12.0 - 16.0 g/dL 13.9  Hematocrit 35.0 - 47.0 % 41.5  Platelets 150 - 440 K/uL 286      Assessment and plan- Patient is a 83y.o. female with following issues:  1. Iron deficiency anemia- H/H stable at 13.9/41.5.  Iron studies from today pending. Given stable CBC, no need for IV iron at this time  2. H/o  DVT- s/p 3 months of coumadin. Long term anticoagulation not recommended due to age and h/o GI bleed in the past.  Repeat cbc ferritin and iron studies in 3 and 6 montsh and I will see her in 6 months   Visit Diagnosis 1. Iron deficiency anemia due to chronic blood loss   2. Chronic deep vein thrombosis (DVT) of popliteal vein of right lower extremity (HCC)      Dr. Randa Evens, MD, MPH [place] at [facility] Pager- [phone] 04/17/2017 4:26 PM

## 2017-04-25 ENCOUNTER — Encounter: Payer: Medicare Other | Admitting: Obstetrics and Gynecology

## 2017-04-30 ENCOUNTER — Ambulatory Visit (INDEPENDENT_AMBULATORY_CARE_PROVIDER_SITE_OTHER): Payer: Medicare Other | Admitting: Obstetrics and Gynecology

## 2017-04-30 ENCOUNTER — Encounter: Payer: Self-pay | Admitting: Obstetrics and Gynecology

## 2017-04-30 VITALS — BP 174/81 | HR 75 | Wt 155.1 lb

## 2017-04-30 DIAGNOSIS — I10 Essential (primary) hypertension: Secondary | ICD-10-CM | POA: Diagnosis not present

## 2017-04-30 DIAGNOSIS — N952 Postmenopausal atrophic vaginitis: Secondary | ICD-10-CM

## 2017-04-30 DIAGNOSIS — R339 Retention of urine, unspecified: Secondary | ICD-10-CM

## 2017-04-30 DIAGNOSIS — N8111 Cystocele, midline: Secondary | ICD-10-CM

## 2017-04-30 DIAGNOSIS — Z4689 Encounter for fitting and adjustment of other specified devices: Secondary | ICD-10-CM | POA: Diagnosis not present

## 2017-04-30 NOTE — Progress Notes (Signed)
    GYNECOLOGY PROGRESS NOTE  Subjective:    Patient ID: [name], female    DOB: [birth year], 83 y.o.   MRN: [id number]  Urinary Tract Infection      Patient is a 83 y.o. [id number] female who presents for pessary check.   Denies vaginal bleeding or abnormal discharge.  She denies difficulty voiding or passing stools.  Denies any other complaints today.    The following portions of the patient's history were reviewed and updated as appropriate: allergies, current medications, past family history, past medical history, past social history, past surgical history and problem list.  Review of Systems A comprehensive review of systems was negative.   Objective:   Blood pressure (!) 174/81, pulse 75, weight 155 lb 2 oz (70.4 kg). General appearance: alert and no distress Abdomen: soft, non-tender; bowel sounds normal; no masses,  no organomegaly.  Pelvic:  The patient's size 5 pessary was removed, cleaned and replaced without complications.  Speculum examination revealed normal vaginal mucosa with no lesions or lacerations   Assessment:   Pessary maintenance H/o incomplete bladder emptying H/o recurrent UTIs.  Cystocele with vaginal vault prolapse Vaginal atrophy (moderate)  HTN (uncontrolled)  Plan:    - Pessary cleaned today.  The patient should return in 10-12 weeks for a pessary check.  Continue to use Trimo-San gel internally once or twice weekly, and continue to use Premarin cream for external use near urethral meatus for h/o recurrent UTIs.   - HTN, currently managed by PCP, uncontrolled today.  Currently asymptomatic. Advised to f/u with PCP for management as indicated.    Rubie Maid, MD [facility]

## 2017-04-30 NOTE — Progress Notes (Signed)
Pt is here for a pessary check.

## 2017-07-02 ENCOUNTER — Inpatient Hospital Stay: Payer: Medicare Other | Attending: Oncology

## 2017-07-02 DIAGNOSIS — K922 Gastrointestinal hemorrhage, unspecified: Secondary | ICD-10-CM | POA: Insufficient documentation

## 2017-07-02 DIAGNOSIS — D500 Iron deficiency anemia secondary to blood loss (chronic): Secondary | ICD-10-CM | POA: Diagnosis present

## 2017-07-02 LAB — CBC WITH DIFFERENTIAL/PLATELET
Basophils Absolute: 0.1 10*3/uL (ref 0–0.1)
Basophils Relative: 1 %
EOS ABS: 0.1 10*3/uL (ref 0–0.7)
Eosinophils Relative: 1 %
HEMATOCRIT: 22.5 % — AB (ref 35.0–47.0)
HEMOGLOBIN: 7 g/dL — AB (ref 12.0–16.0)
LYMPHS ABS: 1.4 10*3/uL (ref 1.0–3.6)
Lymphocytes Relative: 16 %
MCH: 26.2 pg (ref 26.0–34.0)
MCHC: 31.1 g/dL — AB (ref 32.0–36.0)
MCV: 84.3 fL (ref 80.0–100.0)
MONO ABS: 0.7 10*3/uL (ref 0.2–0.9)
MONOS PCT: 8 %
NEUTROS PCT: 74 %
Neutro Abs: 6.2 10*3/uL (ref 1.4–6.5)
Platelets: 472 10*3/uL — ABNORMAL HIGH (ref 150–440)
RBC: 2.67 MIL/uL — ABNORMAL LOW (ref 3.80–5.20)
RDW: 19.7 % — ABNORMAL HIGH (ref 11.5–14.5)
WBC: 8.4 10*3/uL (ref 3.6–11.0)

## 2017-07-02 LAB — FERRITIN: Ferritin: 6 ng/mL — ABNORMAL LOW (ref 11–307)

## 2017-07-03 ENCOUNTER — Telehealth: Payer: Self-pay | Admitting: *Deleted

## 2017-07-03 NOTE — Telephone Encounter (Signed)
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 She is still taking the gabapentin 3 tablets daily and that is not helping. She stated that her right hand is ice cold and they hurt a lot. She went to see Dr.Hooten at [facility] last week and he gave her a shot and she said that didn't help at all. Patient also stated that Dr.Byrnett is her favorite doctor and she wants his opinion on this, and what she should do.

## 2017-07-04 ENCOUNTER — Inpatient Hospital Stay: Payer: Medicare Other

## 2017-07-04 ENCOUNTER — Telehealth: Payer: Self-pay | Admitting: *Deleted

## 2017-07-04 ENCOUNTER — Other Ambulatory Visit: Payer: Self-pay | Admitting: *Deleted

## 2017-07-04 ENCOUNTER — Other Ambulatory Visit: Payer: Self-pay

## 2017-07-04 ENCOUNTER — Other Ambulatory Visit (HOSPITAL_COMMUNITY): Payer: Self-pay | Admitting: Oncology

## 2017-07-04 DIAGNOSIS — D500 Iron deficiency anemia secondary to blood loss (chronic): Secondary | ICD-10-CM

## 2017-07-04 DIAGNOSIS — D649 Anemia, unspecified: Secondary | ICD-10-CM

## 2017-07-04 LAB — HEMOGLOBIN: HEMOGLOBIN: 7.2 g/dL — AB (ref 12.0–16.0)

## 2017-07-04 NOTE — Telephone Encounter (Signed)
I called to check on pt because I jsut got results that hgb 7 from 2/4.  The appt was moved up and we did not know about it.  The patient is feeling lousy, sob on phoen speaking. She is agreeable to 1 unit of blood tom. She does not see any blood in urine or stool. Dr. Janese Banks will sig orders for blood. Patient wants me to call daughter and ask her about how to arrange it. I called daughter and she tells me that she has stopped taking protonix and she realized it today. The pt. Was told to always take the med every day and she got confused about what med it was. She has 22 protonix at her house and she should have ra out on 1/26.  The daughter states that is why she is gotten so bad.  She will ring her today to get type and screen and come back tom. At 9:30 to get 1 unit of blood. Daughter agreeable

## 2017-07-05 ENCOUNTER — Other Ambulatory Visit: Payer: Self-pay | Admitting: Oncology

## 2017-07-05 ENCOUNTER — Inpatient Hospital Stay: Payer: Medicare Other

## 2017-07-05 VITALS — BP 140/70 | HR 74 | Temp 99.0°F | Resp 20

## 2017-07-05 DIAGNOSIS — C801 Malignant (primary) neoplasm, unspecified: Secondary | ICD-10-CM

## 2017-07-05 DIAGNOSIS — D500 Iron deficiency anemia secondary to blood loss (chronic): Secondary | ICD-10-CM | POA: Diagnosis not present

## 2017-07-05 LAB — PREPARE RBC (CROSSMATCH)

## 2017-07-05 MED ORDER — SODIUM CHLORIDE 0.9 % IV SOLN
INTRAVENOUS | Status: DC
  Administered 2017-07-05: 10:00:00 via INTRAVENOUS
  Filled 2017-07-05: qty 1000

## 2017-07-05 MED ORDER — ACETAMINOPHEN 325 MG PO TABS
650.0000 mg | ORAL_TABLET | Freq: Once | ORAL | Status: AC
  Administered 2017-07-05: 650 mg via ORAL
  Filled 2017-07-05: qty 2

## 2017-07-05 NOTE — Progress Notes (Signed)
10:30 - Patient and son state that patient has had previous blood transfusions at [facility] in the past, unsure of dates.  Unknown if patient has had previous blood transfusions reaction.

## 2017-07-06 LAB — BPAM RBC
Blood Product Expiration Date: 201902182359
ISSUE DATE / TIME: 201902071032
UNIT TYPE AND RH: 6200

## 2017-07-06 LAB — TYPE AND SCREEN
ABO/RH(D): A POS
ANTIBODY SCREEN: POSITIVE
Donor AG Type: NEGATIVE
PT AG TYPE: POSITIVE
UNIT DIVISION: 0

## 2017-07-09 ENCOUNTER — Telehealth: Payer: Self-pay

## 2017-07-09 ENCOUNTER — Ambulatory Visit: Payer: Medicare Other | Admitting: Gastroenterology

## 2017-07-09 NOTE — Telephone Encounter (Signed)
-----   Message from [name], MD sent at 07/08/2017  5:53 PM EST ----- [place]   See message below. Ask her to come in and visit me to discuss options  Kir[place]  ----- Message ----- From: Luella Cook, RN Sent: 07/06/2017   1:48 PM To: [name], MD  Pt has iron def. Anemia.  Due to obscure GI bleeding. You saw her in August in [place]. And you scoped her EGD, and colonoscopy and it was neg. She was put on protonix because the zantac did not work for.  In Nov-her hgb 13.9 and on 2/4 her hgb 7.0.  The patients daughter moved her labs up because the patient was feeling bad.  According to the daughter the patient thought her protonix was her imodium and did not need it but maybe 1-2 a week.  So the patient has missed over 30 days of her protonix.   Dr. Janese Banks was wondering because she had such a drop of hgb she wanted to know if pt may need capsule study.  She is 84 years old. Just let me know your answer. Thank you, Moishe Spice, RN for Dr. Janese Banks

## 2017-07-09 NOTE — Telephone Encounter (Signed)
Advised patient's daughter [name] about office visit with Dr. Vicente Males.   [name] will call back to schedule the visit.

## 2017-07-10 ENCOUNTER — Ambulatory Visit (INDEPENDENT_AMBULATORY_CARE_PROVIDER_SITE_OTHER): Payer: Medicare Other | Admitting: Gastroenterology

## 2017-07-10 ENCOUNTER — Other Ambulatory Visit
Admission: RE | Admit: 2017-07-10 | Discharge: 2017-07-10 | Disposition: A | Discharge status: Home / Self Care | Payer: Medicare Other | Source: Ambulatory Visit | Attending: Gastroenterology | Admitting: Gastroenterology

## 2017-07-10 DIAGNOSIS — D509 Iron deficiency anemia, unspecified: Secondary | ICD-10-CM | POA: Insufficient documentation

## 2017-07-10 LAB — CBC WITH DIFFERENTIAL/PLATELET
BASOS ABS: 0.1 10*3/uL (ref 0–0.1)
BASOS PCT: 1 %
EOS PCT: 5 %
Eosinophils Absolute: 0.4 10*3/uL (ref 0–0.7)
HCT: 28.1 % — ABNORMAL LOW (ref 35.0–47.0)
Hemoglobin: 8.8 g/dL — ABNORMAL LOW (ref 12.0–16.0)
LYMPHS PCT: 18 %
Lymphs Abs: 1.4 10*3/uL (ref 1.0–3.6)
MCH: 25.3 pg — ABNORMAL LOW (ref 26.0–34.0)
MCHC: 31.2 g/dL — ABNORMAL LOW (ref 32.0–36.0)
MCV: 81.1 fL (ref 80.0–100.0)
MONO ABS: 0.9 10*3/uL (ref 0.2–0.9)
Monocytes Relative: 12 %
Neutro Abs: 5.1 10*3/uL (ref 1.4–6.5)
Neutrophils Relative %: 64 %
Platelets: 498 10*3/uL — ABNORMAL HIGH (ref 150–440)
RBC: 3.46 MIL/uL — ABNORMAL LOW (ref 3.80–5.20)
RDW: 21 % — AB (ref 11.5–14.5)
WBC: 8 10*3/uL (ref 3.6–11.0)

## 2017-07-10 NOTE — Progress Notes (Signed)
[name] MD, MRCP(U.K) [address]  [place]  [place], [place] [address]  Main: [phone]  Fax: [phone]   Primary Care Physician: [name], MD  Primary Gastroenterologist:  Dr. [name]   No chief complaint on file.   HPI: [name] is a 84 y.o. female   Summary of history :Here to follow up for iron deficiency anemia   She was admitted on 08/21/16 for a GI bleed. She used to suffer from chronic diarrhea and see Dr Tiffany Kocher in the past . She also has a history of GERD , colon cancer and breast cancer . On admission she was noted to be short of breath, have melena, acute drop in HB , microcytic anemia. She had been taking NSAID on a regular basis for a few weeks prior to the event. I performed an EGD and found mild esophagitis, large hiatal hernia . I followed it up with a colonoscopy which was essentially normal except for a small polyp.She was admitted in 10/2016 with anemia, at that time she was on oral iron and wasn't able to tolerate it. Sees bDr Janese Banks in hematology and has since began to get IV iron   Interval history  01/24/2017 -07/10/17  She was doing well till about two months back with Hb stable at 13.9 , all of a sudden 8 days back noted Hb dropped to 7 grams with MCV 84 and ferritin of 6 . Warfarin was stopped end of 02/2017 for DVT. Recently started to get short of breath, Was also found out that she had not been taking her Protonix . Had one unit of blood transfused. Follows with Dr Janese Banks . Felt much better after the transfusion- next day felt week again .Since then has had good and bad days. Due for iron on Thursday . Has possibly noted "dark stool", denies NSAID use.    Current Outpatient Medications  Medication Sig Dispense Refill  . ciprofloxacin (CIPRO) 250 MG tablet Take by mouth.    Marland Kitchen acetaminophen (TYLENOL) 650 MG CR tablet Take by mouth.    . colestipol (COLESTID) 1 g tablet Take 1 tablet by mouth 2 (two) times daily.    . Cranberry 500  MG CAPS Take by mouth daily.    Marland Kitchen estradiol (ESTRACE VAGINAL) 0.1 MG/GM vaginal cream Apply 0.5mg  (pea-sized amount)  just inside the vaginal introitus with a finger-tip every night for two weeks and then Monday, Wednesday and Friday nights. 30 g 12  . gabapentin (NEURONTIN) 100 MG capsule Take on tablet each night for two weeks. Increase to two tablets nightly if no improvement. 90 capsule 6  . lansoprazole (PREVACID) 30 MG capsule take 1 capsule by mouth once daily AT 12 NOON  0  . loperamide (IMODIUM A-D) 2 MG tablet Take 2 mg by mouth as needed for diarrhea or loose stools.    . metoprolol succinate (TOPROL-XL) 25 MG 24 hr tablet Take 25 mg by mouth daily.    Levin Erp SULFATE VAGINAL 0.025 % GEL Place 1 Applicatorful vaginally 2 (two) times a week. 1 Tube 3  . pantoprazole (PROTONIX) 40 MG tablet Take 1 tablet (40 mg total) by mouth daily. 90 tablet 3  . Potassium Gluconate 595 MG CAPS Take 1 capsule by mouth daily.     . Probiotic Product (ALIGN) 4 MG CAPS Take 1 capsule by mouth daily.     . temazepam (RESTORIL) 15 MG capsule Take 15 mg by mouth at bedtime.    Marland Kitchen  triamcinolone (NASACORT ALLERGY 24HR) 55 MCG/ACT AERO nasal inhaler Place 2 sprays into the nose as needed.     . vitamin B-12 (CYANOCOBALAMIN) 1000 MCG tablet Take 1,000 mcg by mouth daily.     No current facility-administered medications for this visit.     Allergies as of 07/10/2017 - Review Complete 04/30/2017  Allergen Reaction Noted  . Codeine Nausea And Vomiting 07/27/2012    ROS:  General: Negative for anorexia, weight loss, fever, chills, fatigue, weakness. ENT: Negative for hoarseness, difficulty swallowing , nasal congestion. CV: Negative for chest pain, angina, palpitations, dyspnea on exertion, peripheral edema.  Respiratory: Negative for dyspnea at rest, dyspnea on exertion, cough, sputum, wheezing.  GI: See history of present illness. GU:  Negative for dysuria, hematuria, urinary incontinence, urinary  frequency, nocturnal urination.  Endo: Negative for unusual weight change.    Physical Examination:   There were no vitals taken for this visit.  General: Well-nourished, well-developed in no acute distress.  Eyes: No icterus. Conjunctivae pink. Mouth: Oropharyngeal mucosa moist and pink , no lesions erythema or exudate. Lungs: Clear to auscultation bilaterally. Non-labored. Heart: Regular rate and rhythm, no murmurs rubs or gallops.  Abdomen: Bowel sounds are normal, nontender, nondistended, no hepatosplenomegaly or masses, no abdominal bruits or hernia , no rebound or guarding.   Extremities: No lower extremity edema. No clubbing or deformities. Neuro: Alert and oriented x 3.  Grossly intact. Skin: Warm and dry, no jaundice.   Psych: Alert and cooperative, normal mood and affect.   Imaging Studies: No results found.  Assessment and Plan:   [name] is a 84 y.o. y/o femalehere to follow upfor Iron deficiency anemia with prior long term use of NSAID's Very likely the anemia was from chronic NSAID use. EGD+colonoscopy was negative for source of blood loss. We have not done a capsule study so far, she is not very keen on a capsule study. I have strongly suggested obtaining one . She will think about it   Plan  1.stat CBC 2. Continue Protonix 3. 2 weeks follow up to discuss capsule study yet again which she has not yet decided on    Dr [name]  MD,MRCP [facility]) Follow up in 2 weeks

## 2017-07-11 ENCOUNTER — Telehealth: Payer: Self-pay | Admitting: *Deleted

## 2017-07-11 NOTE — Telephone Encounter (Signed)
Patient states she is still having problems with her right hand. She states she is not feeling well at this time and has to have an iron treatment tomorrow. The patient declines making an appointment for reassessment at this time since she is not feeling well. Patient instructed to call the office if she wishes to make an appointment once she is felling better. She verbalizes understanding and is appreciative for the call today.

## 2017-07-11 NOTE — Telephone Encounter (Signed)
-----   Message from Robert Bellow, MD sent at 07/11/2017  3:10 PM EST ----- Patient called on Feb 5 with more problems with her right hand. If it is still an issue, arrange a follow up appointment for re-assessment. Thanks.

## 2017-07-12 ENCOUNTER — Inpatient Hospital Stay: Payer: Medicare Other

## 2017-07-12 VITALS — BP 109/62 | HR 68 | Temp 98.7°F | Resp 18

## 2017-07-12 DIAGNOSIS — D500 Iron deficiency anemia secondary to blood loss (chronic): Secondary | ICD-10-CM

## 2017-07-12 MED ORDER — SODIUM CHLORIDE 0.9 % IV SOLN
510.0000 mg | Freq: Once | INTRAVENOUS | Status: AC
  Administered 2017-07-12: 510 mg via INTRAVENOUS
  Filled 2017-07-12: qty 17

## 2017-07-12 MED ORDER — SODIUM CHLORIDE 0.9 % IV SOLN
Freq: Once | INTRAVENOUS | Status: AC
  Administered 2017-07-12: 15:00:00 via INTRAVENOUS
  Filled 2017-07-12: qty 1000

## 2017-07-12 NOTE — Progress Notes (Signed)
Pt tolerated infusion well. Pt and VS stable at discharge.  

## 2017-07-18 ENCOUNTER — Other Ambulatory Visit: Payer: Medicare Other

## 2017-07-19 ENCOUNTER — Inpatient Hospital Stay: Payer: Medicare Other

## 2017-07-19 VITALS — BP 132/67 | HR 75 | Temp 99.1°F | Resp 18

## 2017-07-19 DIAGNOSIS — D500 Iron deficiency anemia secondary to blood loss (chronic): Secondary | ICD-10-CM | POA: Diagnosis not present

## 2017-07-19 MED ORDER — SODIUM CHLORIDE 0.9 % IV SOLN
510.0000 mg | Freq: Once | INTRAVENOUS | Status: AC
  Administered 2017-07-19: 510 mg via INTRAVENOUS
  Filled 2017-07-19: qty 17

## 2017-07-19 MED ORDER — SODIUM CHLORIDE 0.9 % IV SOLN
Freq: Once | INTRAVENOUS | Status: AC
  Administered 2017-07-19: 14:00:00 via INTRAVENOUS
  Filled 2017-07-19: qty 1000

## 2017-07-24 ENCOUNTER — Encounter: Payer: Self-pay | Admitting: Obstetrics and Gynecology

## 2017-07-24 ENCOUNTER — Ambulatory Visit (INDEPENDENT_AMBULATORY_CARE_PROVIDER_SITE_OTHER): Payer: Medicare Other | Admitting: Obstetrics and Gynecology

## 2017-07-24 VITALS — BP 142/73 | HR 91 | Wt 152.5 lb

## 2017-07-24 DIAGNOSIS — Z87440 Personal history of urinary (tract) infections: Secondary | ICD-10-CM

## 2017-07-24 DIAGNOSIS — Z960 Presence of urogenital implants: Secondary | ICD-10-CM

## 2017-07-24 DIAGNOSIS — Z9289 Personal history of other medical treatment: Secondary | ICD-10-CM

## 2017-07-24 DIAGNOSIS — R399 Unspecified symptoms and signs involving the genitourinary system: Secondary | ICD-10-CM | POA: Diagnosis not present

## 2017-07-24 LAB — POCT URINALYSIS DIPSTICK
Bilirubin, UA: NEGATIVE
Glucose, UA: NEGATIVE
Ketones, UA: NEGATIVE
Nitrite, UA: POSITIVE
SPEC GRAV UA: 1.02 (ref 1.010–1.025)
Urobilinogen, UA: 0.2 E.U./dL
pH, UA: 5 (ref 5.0–8.0)

## 2017-07-24 MED ORDER — PHENAZOPYRIDINE HCL 200 MG PO TABS: 200.0000 mg | ORAL_TABLET | Freq: Three times a day (TID) | ORAL | 1 refills | Status: DC | PRN

## 2017-07-24 MED ORDER — NITROFURANTOIN MONOHYD MACRO 100 MG PO CAPS: 100.0000 mg | ORAL_CAPSULE | Freq: Two times a day (BID) | ORAL | 0 refills | Status: DC

## 2017-07-24 NOTE — Progress Notes (Signed)
    GYNECOLOGY PROGRESS NOTE  Subjective:    Patient ID: DEZIREE MOKRY, female    DOB: 07-19-1920, 84 y.o.   MRN: [id number]  HPI  Patient is a 84 y.o. [id number] female with h/o cystocele with vaginal prolapse and pessary use who presents for complaints of burning with urination x 2 days and just an overall feeling of malaise.  Patient reports a h/o UTI's in the past.  Also noting decreased appetite.  Of note, patient was recently treated for a UTI earlier this month (was E. Coli), was treated with Cipro.    The following portions of the patient's history were reviewed and updated as appropriate: allergies, current medications, past family history, past medical history, past social history, past surgical history and problem list.  Review of Systems Pertinent items noted in HPI and remainder of comprehensive ROS otherwise negative.   Objective:   Blood pressure (!) 142/73, pulse 91, weight 152 lb 8 oz (69.2 kg). General appearance: alert and no distress Abdomen: soft, non-tender; bowel sounds normal; no masses,  no organomegaly Pelvic: deferred Back: range of motion normal, mild CVA tenderness on right.    Labs:  Results for orders placed or performed in visit on 07/24/17  POCT urinalysis dipstick  Result Value Ref Range   Color, UA yellow    Clarity, UA clear    Glucose, UA neg    Bilirubin, UA neg    Ketones, UA neg    Spec Grav, UA 1.020 1.010 - 1.025   Blood, UA trace    pH, UA 5.0 5.0 - 8.0   Protein, UA 30+    Urobilinogen, UA 0.2 0.2 or 1.0 E.U./dL   Nitrite, UA positive    Leukocytes, UA Moderate (2+) (A) Negative   Appearance yellow    Odor      Assessment:   Urinary tract infection H/o recurrent UTI's.   Pessary use  Plan:    1. Patient with nitrates again on UA.  Also symptomatic with UTI symptoms. Will treat with Macrobid. Also prescribed Urobel for UTI symptoms (Currently already taking cranberry tablets, but notes it has not been helping recently). 2.   Patient with h/o UTI's, has had a fairly long interval without infections, however now with 2 in the past month. Tried to assess if anything has changed with her diet, hydration, or hygiene.  Patient notes no recent changes. Encouraged use of moist wipes when using the restroom.  3. Pessary use for vaginal vault prolapse, patient is due for a pessary check next week.  Will also f/u with UTI symptoms at that time.     Rubie Maid, MD [facility]

## 2017-07-24 NOTE — Progress Notes (Signed)
Pt stated that she has burning when she urninate x2 days. Pt stated that he hurts really bad. Pt has no appetite and just feels really bad.

## 2017-07-27 ENCOUNTER — Telehealth: Payer: Self-pay | Admitting: *Deleted

## 2017-07-27 NOTE — Telephone Encounter (Signed)
Son called concerned that patient does not have an appointment until June to recheck her labs after having received blood transfusion and 2 iron infusions. Asking if she needs to come in sooner for at least a lab check. Please advise

## 2017-07-27 NOTE — Telephone Encounter (Signed)
Lets repeat cbc in 3 weeks.

## 2017-07-30 NOTE — Telephone Encounter (Signed)
Call placed to son and he will call me back when he can look at his mothers calendar

## 2017-07-30 NOTE — Telephone Encounter (Signed)
Appointment made for 3/21 and accepted

## 2017-07-31 ENCOUNTER — Encounter: Payer: Medicare Other | Admitting: Obstetrics and Gynecology

## 2017-08-01 ENCOUNTER — Other Ambulatory Visit: Payer: Self-pay | Admitting: *Deleted

## 2017-08-01 DIAGNOSIS — D500 Iron deficiency anemia secondary to blood loss (chronic): Secondary | ICD-10-CM

## 2017-08-02 ENCOUNTER — Other Ambulatory Visit: Payer: Self-pay | Admitting: *Deleted

## 2017-08-02 DIAGNOSIS — D500 Iron deficiency anemia secondary to blood loss (chronic): Secondary | ICD-10-CM

## 2017-08-03 ENCOUNTER — Ambulatory Visit (INDEPENDENT_AMBULATORY_CARE_PROVIDER_SITE_OTHER): Payer: Medicare Other | Admitting: Obstetrics and Gynecology

## 2017-08-03 ENCOUNTER — Encounter: Payer: Self-pay | Admitting: Obstetrics and Gynecology

## 2017-08-03 VITALS — BP 157/65 | HR 88 | Wt 151.1 lb

## 2017-08-03 DIAGNOSIS — N952 Postmenopausal atrophic vaginitis: Secondary | ICD-10-CM | POA: Diagnosis not present

## 2017-08-03 DIAGNOSIS — Z4689 Encounter for fitting and adjustment of other specified devices: Secondary | ICD-10-CM | POA: Diagnosis not present

## 2017-08-03 DIAGNOSIS — N8111 Cystocele, midline: Secondary | ICD-10-CM | POA: Diagnosis not present

## 2017-08-03 DIAGNOSIS — Z87440 Personal history of urinary (tract) infections: Secondary | ICD-10-CM | POA: Diagnosis not present

## 2017-08-03 DIAGNOSIS — R309 Painful micturition, unspecified: Secondary | ICD-10-CM

## 2017-08-03 LAB — POCT URINALYSIS DIPSTICK
BILIRUBIN UA: NEGATIVE
GLUCOSE UA: NEGATIVE
Ketones, UA: NEGATIVE
Nitrite, UA: NEGATIVE
Protein, UA: NEGATIVE
Spec Grav, UA: <=1.005 — AB (ref 1.010–1.025)
Urobilinogen, UA: 0.2 E.U./dL
pH, UA: 6 (ref 5.0–8.0)

## 2017-08-03 NOTE — Progress Notes (Signed)
    GYNECOLOGY PROGRESS NOTE  Subjective:    Patient ID: [name], female    DOB: 17-Jul-1920, 84 y.o.   MRN: [id number]    Patient is a 84 y.o. [id number] female who presents for pessary check.   Denies vaginal bleeding or abnormal discharge.  She denies difficulty voiding or passing stools.  Patient still notes that she is feels some urinary discomfort despite recent treatment for UTI. Overall notes though that she is feeling a lot better since last week. Denies any other complaints today.    The following portions of the patient's history were reviewed and updated as appropriate: allergies, current medications, past family history, past medical history, past social history, past surgical history and problem list.  Review of Systems A comprehensive review of systems was negative.   Objective:   Blood pressure (!) 157/65, pulse 88, weight 151 lb 1.6 oz (68.5 kg). General appearance: alert and no distress Abdomen: soft, non-tender; bowel sounds normal; no masses,  no organomegaly.  Pelvic:  The patient's size 5 pessary was removed, cleaned and replaced without complications.  Speculum examination revealed normal vaginal mucosa with no lesions or lacerations    Labs:  Results for orders placed or performed in visit on 08/03/17  POCT urinalysis dipstick  Result Value Ref Range   Color, UA yellow    Clarity, UA clear    Glucose, UA neg    Bilirubin, UA neg    Ketones, UA neg    Spec Grav, UA <=1.005 (A) 1.010 - 1.025   Blood, UA mod    pH, UA 6.0 5.0 - 8.0   Protein, UA neg    Urobilinogen, UA 0.2 0.2 or 1.0 E.U./dL   Nitrite, UA neg    Leukocytes, UA Moderate (2+) (A) Negative   Appearance yellow    Odor      Assessment:   Pessary maintenance H/o recurrent UTIs.  Cystocele with vaginal vault prolapse Vaginal atrophy (moderate)    Plan:    - Pessary cleaned today.  The patient should return in 10-12 weeks for a pessary check.  Continue to use Trimo-San gel  internally once or twice weekly, and continue to use Premarin cream for external use near urethral meatus for h/o recurrent UTIs.   - Patient still with irritative bladder symptoms, UA today notes absence of nitrites. Advised on increasing hydration and cranberry juice/supplements as needed.    Rubie Maid, MD [facility]

## 2017-08-03 NOTE — Progress Notes (Signed)
Pt is having uti symptoms.

## 2017-08-16 ENCOUNTER — Other Ambulatory Visit: Payer: Self-pay

## 2017-08-16 ENCOUNTER — Telehealth: Payer: Self-pay

## 2017-08-16 ENCOUNTER — Inpatient Hospital Stay: Payer: Medicare Other | Attending: Oncology

## 2017-08-16 DIAGNOSIS — D649 Anemia, unspecified: Secondary | ICD-10-CM

## 2017-08-16 DIAGNOSIS — D500 Iron deficiency anemia secondary to blood loss (chronic): Secondary | ICD-10-CM | POA: Diagnosis present

## 2017-08-16 LAB — CBC
HCT: 36.9 % (ref 35.0–47.0)
Hemoglobin: 12.2 g/dL (ref 12.0–16.0)
MCH: 28.1 pg (ref 26.0–34.0)
MCHC: 33.1 g/dL (ref 32.0–36.0)
MCV: 85 fL (ref 80.0–100.0)
PLATELETS: 448 10*3/uL — AB (ref 150–440)
RBC: 4.34 MIL/uL (ref 3.80–5.20)
RDW: 25.8 % — ABNORMAL HIGH (ref 11.5–14.5)
WBC: 7.5 10*3/uL (ref 3.6–11.0)

## 2017-08-16 LAB — SAMPLE TO BLOOD BANK

## 2017-08-16 NOTE — Telephone Encounter (Signed)
Per Dr. Janese Banks the patient was contaced  about lab results, the patient was agreeable and understanding.

## 2017-08-17 ENCOUNTER — Encounter (INDEPENDENT_AMBULATORY_CARE_PROVIDER_SITE_OTHER): Payer: Self-pay

## 2017-09-07 ENCOUNTER — Encounter: Payer: Self-pay | Admitting: *Deleted

## 2017-09-17 ENCOUNTER — Inpatient Hospital Stay: Payer: Medicare Other | Attending: Oncology

## 2017-09-17 DIAGNOSIS — D500 Iron deficiency anemia secondary to blood loss (chronic): Secondary | ICD-10-CM

## 2017-09-17 DIAGNOSIS — D649 Anemia, unspecified: Secondary | ICD-10-CM

## 2017-09-17 DIAGNOSIS — Z8719 Personal history of other diseases of the digestive system: Secondary | ICD-10-CM | POA: Insufficient documentation

## 2017-09-17 LAB — CBC WITH DIFFERENTIAL/PLATELET
Basophils Absolute: 0.1 10*3/uL (ref 0–0.1)
Basophils Relative: 1 %
EOS ABS: 0 10*3/uL (ref 0–0.7)
Eosinophils Relative: 0 %
HCT: 40.5 % (ref 35.0–47.0)
Hemoglobin: 13.6 g/dL (ref 12.0–16.0)
LYMPHS ABS: 1.5 10*3/uL (ref 1.0–3.6)
LYMPHS PCT: 16 %
MCH: 28.7 pg (ref 26.0–34.0)
MCHC: 33.6 g/dL (ref 32.0–36.0)
MCV: 85.5 fL (ref 80.0–100.0)
Monocytes Absolute: 0.7 10*3/uL (ref 0.2–0.9)
Monocytes Relative: 7 %
NEUTROS PCT: 76 %
Neutro Abs: 7.1 10*3/uL — ABNORMAL HIGH (ref 1.4–6.5)
Platelets: 319 10*3/uL (ref 150–440)
RBC: 4.73 MIL/uL (ref 3.80–5.20)
RDW: 25.1 % — ABNORMAL HIGH (ref 11.5–14.5)
WBC: 9.4 10*3/uL (ref 3.6–11.0)

## 2017-09-17 LAB — IRON AND TIBC
Iron: 49 ug/dL (ref 28–170)
SATURATION RATIOS: 16 % (ref 10.4–31.8)
TIBC: 304 ug/dL (ref 250–450)
UIBC: 255 ug/dL

## 2017-09-17 LAB — FERRITIN: Ferritin: 57 ng/mL (ref 11–307)

## 2017-09-23 ENCOUNTER — Encounter: Payer: Self-pay | Admitting: Gastroenterology

## 2017-10-03 ENCOUNTER — Telehealth: Payer: Self-pay | Admitting: *Deleted

## 2017-10-03 NOTE — Telephone Encounter (Signed)
OK to see her Monday if no SOB.

## 2017-10-03 NOTE — Telephone Encounter (Signed)
Patient called to let Dr Bary Castilla know she tripped over a rug fell Friday and hit her left side(post mastectomy 2013). She states she cut her left hand/wrist but seems to be improving with the use of antibiotic ointment. She is taking tylenol but her left chest/side is still bother her especially when she gets up and down. No shortness of breath. Recommended heating pad as well. She wanted an appointment, I recommending her to see Dr Kary Kos but she really wants to see you. I told her you were in surgery but that I would let you know. ?? Thanks Her one year f/u is 11-08-17.

## 2017-10-03 NOTE — Telephone Encounter (Signed)
appt for Monday at 11:30, pt agrees.

## 2017-10-08 ENCOUNTER — Encounter: Payer: Self-pay | Admitting: General Surgery

## 2017-10-08 ENCOUNTER — Ambulatory Visit (INDEPENDENT_AMBULATORY_CARE_PROVIDER_SITE_OTHER): Payer: Medicare Other | Admitting: General Surgery

## 2017-10-08 VITALS — BP 164/72 | HR 87 | Ht 62.0 in | Wt 153.0 lb

## 2017-10-08 DIAGNOSIS — R0789 Other chest pain: Secondary | ICD-10-CM | POA: Insufficient documentation

## 2017-10-08 NOTE — Patient Instructions (Signed)
atient to use gold bond power right breast.  Return as scheduled.

## 2017-10-08 NOTE — Progress Notes (Signed)
Patient ID: [name], female   DOB: Aug 09, 1920, 84 y.o.   MRN: [id number]  Chief Complaint  Patient presents with  . Follow-up    HPI [name] is a 84 y.o. female here today for evaluation of left chest wall pain.  The patient was in her usual state of good health until Sep 26, 2017 when she tripped coming into the house after going to get her mail.  She landed on her left side and buttock.  She has had pain in the left lateral chest wall since that time, worse since she wore a braw to dress up for Mother's Day yesterday.   Rash under her right breast recently treated with nystatin cream.    Son , present at visit.    HPI  Past Medical History:  Diagnosis Date  . Allergy   . Arthritis    back  . Benign neoplasm of skin, site unspecified   . Bowel trouble   . Breast cancer ([place])   . Cancer ([place])    colon 20 years ago  . Cancer of breast [facility]) April 10,.2013   Left breast, 3.8 cm, 4 cm axillary node, T2, N1a, ER negative PR negative, HER-2/neu not amplified.  . Cataract   . Colon cancer ([place])   . Hard of hearing   . Heartburn   . Hiatal hernia   . History of stomach ulcers   . History of stomach ulcers   . Hypertension 2012  . Personal history of malignant neoplasm of breast 2013   LEFT MASTECTOMY,left modified radical mastectomy on September 06, 2011 483.8 cm primary tumor with a 4.6 cm axillary metastasis. 3 of 13 nodes were positive. T2,N1a lesion. This was a triple negative lesion  . Shingles Dec 2015  . Vaginal atrophy 08/10/2015    Past Surgical History:  Procedure Laterality Date  . ABDOMINAL HYSTERECTOMY     42 YEARS AGO  . BREAST SURGERY Left 09-06-11   left modified radical mastectomy on September 06, 2011 483.8 cm primary tumor with a 4.6 cm axillary metastasis. 3 of 13 nodes were positive. T2,N1a lesion. This was a triple negative lesion  . CARPAL TUNNEL RELEASE Right September 25, 2014   Skip Estimable, M.D.  . COLON RESECTION  1985  . COLON SURGERY  20 YEARS  [place]  . COLONOSCOPY  2012   DR. ELLIOTT  . COLONOSCOPY WITH PROPOFOL N/A 08/25/2016   Procedure: COLONOSCOPY WITH PROPOFOL;  Surgeon: [name], MD;  Location: [facility] ENDOSCOPY;  Service: Gastroenterology;  Laterality: N/A;  . ESOPHAGOGASTRODUODENOSCOPY (EGD) WITH PROPOFOL N/A 08/23/2016   Procedure: ESOPHAGOGASTRODUODENOSCOPY (EGD) WITH PROPOFOL;  Surgeon: [name], MD;  Location: [facility] ENDOSCOPY;  Service: Endoscopy;  Laterality: N/A;  . MASTECTOMY Left 2013   left modified radical mastectomy on September 06, 2011 Stage 2; 3.8 cm primary tumor with a 4.6 cm axillary metastasis. 3 of 13 nodes were positive. T2,N1a lesion. This was a triple negative lesion  . UPPER GI ENDOSCOPY  2012   BLEEDING    Family History  Problem Relation Age of Onset  . Lung cancer Brother        colono ca  . Cancer Brother   . Skin cancer Daughter   . Leukemia Father   . Cancer Father   . Colon cancer Mother   . Cancer Mother   . Skin cancer Son   . Kidney disease Neg Hx   . Bladder Cancer Neg Hx     Social History Social  History   Tobacco Use  . Smoking status: Never Smoker  . Smokeless tobacco: Never Used  Substance Use Topics  . Alcohol use: No  . Drug use: No    Allergies  Allergen Reactions  . Codeine Nausea And Vomiting    Current Outpatient Medications  Medication Sig Dispense Refill  . acetaminophen (TYLENOL) 650 MG CR tablet Take by mouth.    . colestipol (COLESTID) 1 g tablet Take 1 tablet by mouth 2 (two) times daily.    . Cranberry 500 MG CAPS Take by mouth daily.    Marland Kitchen estradiol (ESTRACE VAGINAL) 0.1 MG/GM vaginal cream Apply 0.52m (pea-sized amount)  just inside the vaginal introitus with a finger-tip every night for two weeks and then Monday, Wednesday and Friday nights. 30 g 12  . gabapentin (NEURONTIN) 100 MG capsule Take on tablet each night for two weeks. Increase to two tablets nightly if no improvement. 90 capsule 6  . lansoprazole (PREVACID) 30 MG capsule take 1 capsule by  mouth once daily AT 12 NOON  0  . loperamide (IMODIUM A-D) 2 MG tablet Take 2 mg by mouth as needed for diarrhea or loose stools.    . metoprolol succinate (TOPROL-XL) 25 MG 24 hr tablet Take 25 mg by mouth daily.    .Levin ErpSULFATE VAGINAL 0.025 % GEL Place 1 Applicatorful vaginally 2 (two) times a week. 1 Tube 3  . pantoprazole (PROTONIX) 40 MG tablet Take 1 tablet (40 mg total) by mouth daily. 90 tablet 3  . phenazopyridine (PYRIDIUM) 200 MG tablet Take 1 tablet (200 mg total) by mouth 3 (three) times daily as needed for pain (urethral spasm). 10 tablet 1  . Potassium Gluconate 595 MG CAPS Take 1 capsule by mouth daily.     . Probiotic Product (ALIGN) 4 MG CAPS Take 1 capsule by mouth daily.     . temazepam (RESTORIL) 15 MG capsule Take 15 mg by mouth at bedtime.    . triamcinolone (NASACORT ALLERGY 24HR) 55 MCG/ACT AERO nasal inhaler Place 2 sprays into the nose as needed.     . vitamin B-12 (CYANOCOBALAMIN) 1000 MCG tablet Take 1,000 mcg by mouth daily.     No current facility-administered medications for this visit.     Review of Systems Review of Systems  Constitutional: Negative.   Respiratory: Negative.   Cardiovascular: Negative.     Blood pressure (!) 164/72, pulse 87, height 5' 2" (1.575 m), weight 153 lb (69.4 kg).  Physical Exam Physical Exam  Constitutional: She is oriented to person, place, and time. She appears well-developed and well-nourished.  Eyes: Conjunctivae are normal. No scleral icterus.  Neck: Neck supple.  Cardiovascular: Normal rate, regular rhythm and normal heart sounds.  Pulmonary/Chest: Effort normal and breath sounds normal. Right breast exhibits no inverted nipple, no mass, no nipple discharge, no skin change and no tenderness.  No evidence of pleural effusion or pleural friction rub on auscultation.    Lymphadenopathy:    She has no cervical adenopathy.  Neurological: She is alert and oriented to person, place, and time.  Skin: Skin is  warm and dry.       Assessment    Chest wall trauma status post fall.    Plan   Patient to use gold bond power and take care to dry the area under the right breast after bathing.  She will return in 1 month for her regularly scheduled appointment for reassessment of her symptoms.    The patient can  use a heating pad to the lateral chest wall for comfort as well as OTC analgesics.   HPI, Physical Exam, Assessment and Plan have been scribed under the direction and in the presence of Hervey Ard, MD.  Gaspar Cola, CMA  I have completed the exam and reviewed the above documentation for accuracy and completeness.  I agree with the above.  Haematologist has been used and any errors in dictation or transcription are unintentional.  Hervey Ard, M.D., F.A.C.S.  Forest Gleason  10/08/2017, 2:07 PM

## 2017-10-12 ENCOUNTER — Encounter: Payer: Medicare Other | Admitting: Obstetrics and Gynecology

## 2017-10-16 ENCOUNTER — Ambulatory Visit: Payer: Medicare Other | Admitting: Oncology

## 2017-10-16 ENCOUNTER — Other Ambulatory Visit: Payer: Medicare Other

## 2017-10-17 ENCOUNTER — Encounter: Payer: Self-pay | Admitting: Obstetrics and Gynecology

## 2017-10-17 ENCOUNTER — Ambulatory Visit (INDEPENDENT_AMBULATORY_CARE_PROVIDER_SITE_OTHER): Payer: Medicare Other | Admitting: Obstetrics and Gynecology

## 2017-10-17 ENCOUNTER — Inpatient Hospital Stay: Payer: Medicare Other | Attending: Oncology

## 2017-10-17 ENCOUNTER — Other Ambulatory Visit: Payer: Self-pay

## 2017-10-17 VITALS — BP 184/83 | HR 85 | Ht 62.0 in | Wt 154.9 lb

## 2017-10-17 DIAGNOSIS — Z8719 Personal history of other diseases of the digestive system: Secondary | ICD-10-CM | POA: Insufficient documentation

## 2017-10-17 DIAGNOSIS — D649 Anemia, unspecified: Secondary | ICD-10-CM

## 2017-10-17 DIAGNOSIS — Z87440 Personal history of urinary (tract) infections: Secondary | ICD-10-CM | POA: Diagnosis not present

## 2017-10-17 DIAGNOSIS — N8111 Cystocele, midline: Secondary | ICD-10-CM

## 2017-10-17 DIAGNOSIS — N952 Postmenopausal atrophic vaginitis: Secondary | ICD-10-CM | POA: Diagnosis not present

## 2017-10-17 DIAGNOSIS — N3000 Acute cystitis without hematuria: Secondary | ICD-10-CM | POA: Diagnosis not present

## 2017-10-17 DIAGNOSIS — D500 Iron deficiency anemia secondary to blood loss (chronic): Secondary | ICD-10-CM | POA: Insufficient documentation

## 2017-10-17 DIAGNOSIS — K209 Esophagitis, unspecified: Secondary | ICD-10-CM | POA: Diagnosis not present

## 2017-10-17 DIAGNOSIS — Z4689 Encounter for fitting and adjustment of other specified devices: Secondary | ICD-10-CM

## 2017-10-17 LAB — POCT URINALYSIS DIPSTICK
Bilirubin, UA: NEGATIVE
Glucose, UA: NEGATIVE
KETONES UA: NEGATIVE
NITRITE UA: POSITIVE
PROTEIN UA: POSITIVE — AB
SPEC GRAV UA: 1.02 (ref 1.010–1.025)
Urobilinogen, UA: 0.2 E.U./dL
pH, UA: 5 (ref 5.0–8.0)

## 2017-10-17 LAB — CBC WITH DIFFERENTIAL/PLATELET
BASOS ABS: 0.1 10*3/uL (ref 0–0.1)
BASOS PCT: 1 %
Eosinophils Absolute: 0.1 10*3/uL (ref 0–0.7)
Eosinophils Relative: 1 %
HCT: 38.7 % (ref 35.0–47.0)
Hemoglobin: 13.1 g/dL (ref 12.0–16.0)
Lymphocytes Relative: 21 %
Lymphs Abs: 1.8 10*3/uL (ref 1.0–3.6)
MCH: 30.3 pg (ref 26.0–34.0)
MCHC: 33.9 g/dL (ref 32.0–36.0)
MCV: 89.5 fL (ref 80.0–100.0)
MONO ABS: 0.7 10*3/uL (ref 0.2–0.9)
Monocytes Relative: 9 %
Neutro Abs: 6 10*3/uL (ref 1.4–6.5)
Neutrophils Relative %: 68 %
PLATELETS: 342 10*3/uL (ref 150–440)
RBC: 4.33 MIL/uL (ref 3.80–5.20)
RDW: 16.2 % — AB (ref 11.5–14.5)
WBC: 8.6 10*3/uL (ref 3.6–11.0)

## 2017-10-17 MED ORDER — CIPROFLOXACIN HCL 500 MG PO TABS: 500.0000 mg | ORAL_TABLET | Freq: Two times a day (BID) | ORAL | 0 refills | Status: DC

## 2017-10-17 NOTE — Progress Notes (Signed)
    GYNECOLOGY PROGRESS NOTE  Subjective:    Patient ID: [name], female    DOB: 1921/03/01, 84 y.o.   MRN: [id number]    Patient is a 84 y.o. [id number] female who presents for pessary check.   Denies vaginal bleeding or abnormal discharge.  She denies difficulty voiding or passing stools.  Patient noting mild urinary discomfort. Has h/o UTI's.  Denies any other complaints today.    The following portions of the patient's history were reviewed and updated as appropriate: allergies, current medications, past family history, past medical history, past social history, past surgical history and problem list.  Review of Systems A comprehensive review of systems was negative.   Objective:   Blood pressure (!) 184/83, pulse 85, height 5\' 2"  (1.575 m), weight 154 lb 14.4 oz (70.3 kg). General appearance: alert and no distress Abdomen: soft, non-tender; bowel sounds normal; no masses,  no organomegaly.  Pelvic:  The patient's size 5 pessary was removed, cleaned and replaced without complications.  Speculum examination revealed normal vaginal mucosa with no lesions or lacerations    Labs:  Results for orders placed or performed in visit on 10/17/17  POCT urinalysis dipstick  Result Value Ref Range   Color, UA yellow    Clarity, UA clear    Glucose, UA Negative Negative   Bilirubin, UA neg    Ketones, UA neg    Spec Grav, UA 1.020 1.010 - 1.025   Blood, UA 1+    pH, UA 5.0 5.0 - 8.0   Protein, UA Positive (A) Negative   Urobilinogen, UA 0.2 0.2 or 1.0 E.U./dL   Nitrite, UA positive    Leukocytes, UA Moderate (2+) (A) Negative   Appearance yellow    Odor        Assessment:   Pessary maintenance H/o recurrent UTIs.  Acute UTI Cystocele with vaginal vault prolapse Vaginal atrophy (moderate)    Plan:    - Pessary cleaned today.  The patient should return in 10-12 weeks for a pessary check.  Continue to use Trimo-San gel internally once or twice weekly, and continue to use  Premarin cream for external use near urethral meatus for h/o recurrent UTIs.   - Patient with mild bladder irritation.  UA today notes positive nitrites. Will send prescription for Cipro.  Continue to advise on increasing hydration and cranberry juice/supplements as needed.     Rubie Maid, MD [facility]

## 2017-10-17 NOTE — Progress Notes (Signed)
Pt is present today for her 10 week pessary check. Pt stated that pessary is working for her. No concerns stated by the patient.

## 2017-11-01 ENCOUNTER — Inpatient Hospital Stay (HOSPITAL_BASED_OUTPATIENT_CLINIC_OR_DEPARTMENT_OTHER): Payer: Medicare Other | Admitting: Oncology

## 2017-11-01 ENCOUNTER — Encounter: Payer: Self-pay | Admitting: Oncology

## 2017-11-01 ENCOUNTER — Inpatient Hospital Stay: Payer: Medicare Other | Attending: Oncology

## 2017-11-01 VITALS — BP 142/76 | HR 84 | Temp 98.2°F | Resp 18 | Ht 62.0 in | Wt 154.0 lb

## 2017-11-01 DIAGNOSIS — Z86718 Personal history of other venous thrombosis and embolism: Secondary | ICD-10-CM | POA: Diagnosis not present

## 2017-11-01 DIAGNOSIS — D509 Iron deficiency anemia, unspecified: Secondary | ICD-10-CM

## 2017-11-01 DIAGNOSIS — K449 Diaphragmatic hernia without obstruction or gangrene: Secondary | ICD-10-CM

## 2017-11-01 DIAGNOSIS — Z801 Family history of malignant neoplasm of trachea, bronchus and lung: Secondary | ICD-10-CM | POA: Insufficient documentation

## 2017-11-01 DIAGNOSIS — Z9012 Acquired absence of left breast and nipple: Secondary | ICD-10-CM

## 2017-11-01 DIAGNOSIS — Z79899 Other long term (current) drug therapy: Secondary | ICD-10-CM

## 2017-11-01 DIAGNOSIS — Z8719 Personal history of other diseases of the digestive system: Secondary | ICD-10-CM | POA: Insufficient documentation

## 2017-11-01 DIAGNOSIS — I10 Essential (primary) hypertension: Secondary | ICD-10-CM

## 2017-11-01 DIAGNOSIS — Z800 Family history of malignant neoplasm of digestive organs: Secondary | ICD-10-CM | POA: Insufficient documentation

## 2017-11-01 DIAGNOSIS — Z853 Personal history of malignant neoplasm of breast: Secondary | ICD-10-CM

## 2017-11-01 DIAGNOSIS — D649 Anemia, unspecified: Secondary | ICD-10-CM

## 2017-11-01 DIAGNOSIS — Z85038 Personal history of other malignant neoplasm of large intestine: Secondary | ICD-10-CM | POA: Insufficient documentation

## 2017-11-01 LAB — IRON AND TIBC
Iron: 70 ug/dL (ref 28–170)
SATURATION RATIOS: 21 % (ref 10.4–31.8)
TIBC: 330 ug/dL (ref 250–450)
UIBC: 260 ug/dL

## 2017-11-01 LAB — CBC WITH DIFFERENTIAL/PLATELET
BASOS ABS: 0.1 10*3/uL (ref 0–0.1)
Basophils Relative: 1 %
Eosinophils Absolute: 0.1 10*3/uL (ref 0–0.7)
Eosinophils Relative: 1 %
HEMATOCRIT: 40.8 % (ref 35.0–47.0)
HEMOGLOBIN: 13.8 g/dL (ref 12.0–16.0)
Lymphocytes Relative: 18 %
Lymphs Abs: 1.6 10*3/uL (ref 1.0–3.6)
MCH: 30.9 pg (ref 26.0–34.0)
MCHC: 33.9 g/dL (ref 32.0–36.0)
MCV: 91.3 fL (ref 80.0–100.0)
Monocytes Absolute: 0.7 10*3/uL (ref 0.2–0.9)
Monocytes Relative: 7 %
NEUTROS ABS: 6.7 10*3/uL — AB (ref 1.4–6.5)
NEUTROS PCT: 73 %
Platelets: 332 10*3/uL (ref 150–440)
RBC: 4.47 MIL/uL (ref 3.80–5.20)
RDW: 15.6 % — ABNORMAL HIGH (ref 11.5–14.5)
WBC: 9.1 10*3/uL (ref 3.6–11.0)

## 2017-11-01 LAB — FERRITIN: FERRITIN: 42 ng/mL (ref 11–307)

## 2017-11-01 NOTE — Progress Notes (Signed)
No new changes noted today 

## 2017-11-02 NOTE — Progress Notes (Signed)
   Hematology/Oncology Consult note [facility]  [phone] [phone]  Patient Care Team: Hedrick, James, MD as PCP - General (Family Medicine) Byrnett, Jeffrey W, MD as Consulting Physician (General Surgery) Hedrick, James, MD as Referring Physician (Family Medicine)   Name of the patient: [name]  [id number]  05/29/1921   Date of visit: 11/02/17  Diagnosis- iron deficiency anemia likely due to GI bleed   Chief complaint/ Reason for visit- routine f/u of iron deficiency anemia  Heme/Onc history: patient is a 84-year-old female with severe iron deficiency anemia. In March 2018 she was admitted for GI bleeding and underwent EGD and colonoscopy. EGD showed mild esophagitis and a large hiatal hernia. Colonoscopy was essentially normal except for an small polyp. She was given blood transfusion and was discharged on oral iron. She has not been able to tolerate oral iron. She was readmitted to the hospital in June 2018 again with severe iron deficiency anemia. She received blood transfusion as well as IV iron. She has not had smallbowel capsule study yet and she is 89 and there were concerns anemia and didn't find evidence of bleeding on capsule study she would not be able to tolerate a push enteroscopy. She has therefore been referred to me for IV iron. She has had urinalysis in the past which has been negative for hematuria. Most recent CBC from 11/28/2016 showed white count of 8.2, H&H of 7.6/24.4 with an MCV of 73.8 and a platelet count of 359  Patient reports dark stools but denies any melanotic tarry stools or bright red blood per rectum. Denies any nosebleeds or gum bleeds. Denies any consistent use of NSAIDs. She does have gradually worsening shortness of breath as well as leg swelling over the last few months. She is independent of her ADLs  patient received 2 doses of feraheme in July 2018. She then presented to her pcp with RLE pain  and swelling. She underwent doppler of RLE which showed:Positive for deep venous thrombosis of the right popliteal vein, and some of the right calf veins.  Patient was started on lovenox and then switched to coumadin and has completed 3 months of anticoagulation    Interval history- she is doing well and feels great. Denies any fatigue or sob. She is driving now  ECOG PS- 1 Pain scale- 0   Review of systems- Review of Systems  Constitutional: Negative for chills, fever, malaise/fatigue and weight loss.  HENT: Negative for congestion, ear discharge and nosebleeds.   Eyes: Negative for blurred vision.  Respiratory: Negative for cough, hemoptysis, sputum production, shortness of breath and wheezing.   Cardiovascular: Negative for chest pain, palpitations, orthopnea and claudication.  Gastrointestinal: Negative for abdominal pain, blood in stool, constipation, diarrhea, heartburn, melena, nausea and vomiting.  Genitourinary: Negative for dysuria, flank pain, frequency, hematuria and urgency.  Musculoskeletal: Negative for back pain, joint pain and myalgias.  Skin: Negative for rash.  Neurological: Negative for dizziness, tingling, focal weakness, seizures, weakness and headaches.  Endo/Heme/Allergies: Does not bruise/bleed easily.  Psychiatric/Behavioral: Negative for depression and suicidal ideas. The patient does not have insomnia.       Allergies  Allergen Reactions  . Codeine Nausea And Vomiting     Past Medical History:  Diagnosis Date  . Allergy   . Arthritis    back  . Benign neoplasm of skin, site unspecified   . Bowel trouble   . Breast cancer ([facility])   . Cancer ([facility])    colon 20   years ago  . Cancer of breast [facility]) April 10,.2013   Left breast, 3.8 cm, 4 cm axillary node, T2, N1a, ER negative PR negative, HER-2/neu not amplified.  . Cataract   . Colon cancer ([place])   . Hard of hearing   . Heartburn   . Hiatal hernia   . History of stomach ulcers   . History  of stomach ulcers   . Hypertension 2012  . Personal history of malignant neoplasm of breast 2013   LEFT MASTECTOMY,left modified radical mastectomy on September 06, 2011 483.8 cm primary tumor with a 4.6 cm axillary metastasis. 3 of 13 nodes were positive. T2,N1a lesion. This was a triple negative lesion  . Shingles Dec 2015  . Vaginal atrophy 08/10/2015     Past Surgical History:  Procedure Laterality Date  . ABDOMINAL HYSTERECTOMY     42 YEARS AGO  . BREAST SURGERY Left 09-06-11   left modified radical mastectomy on September 06, 2011 483.8 cm primary tumor with a 4.6 cm axillary metastasis. 3 of 13 nodes were positive. T2,N1a lesion. This was a triple negative lesion  . CARPAL TUNNEL RELEASE Right September 25, 2014   Skip Estimable, M.D.  . COLON RESECTION  1985  . COLON SURGERY  20 YEARS AGO  . COLONOSCOPY  2012   DR. ELLIOTT  . COLONOSCOPY WITH PROPOFOL N/A 08/25/2016   Procedure: COLONOSCOPY WITH PROPOFOL;  Surgeon: [name], MD;  Location: [facility] ENDOSCOPY;  Service: Gastroenterology;  Laterality: N/A;  . ESOPHAGOGASTRODUODENOSCOPY (EGD) WITH PROPOFOL N/A 08/23/2016   Procedure: ESOPHAGOGASTRODUODENOSCOPY (EGD) WITH PROPOFOL;  Surgeon: [name], MD;  Location: [facility] ENDOSCOPY;  Service: Endoscopy;  Laterality: N/A;  . MASTECTOMY Left 2013   left modified radical mastectomy on September 06, 2011 Stage 2; 3.8 cm primary tumor with a 4.6 cm axillary metastasis. 3 of 13 nodes were positive. T2,N1a lesion. This was a triple negative lesion  . UPPER GI ENDOSCOPY  2012   BLEEDING    Social History   Socioeconomic History  . Marital status: Widowed    Spouse name: Not on file  . Number of children: Not on file  . Years of education: Not on file  . Highest education level: Not on file  Occupational History  . Not on file  Social Needs  . Financial resource strain: Not on file  . Food insecurity:    Worry: Not on file    Inability: Not on file  . Transportation needs:    Medical: Not on file      Non-medical: Not on file  Tobacco Use  . Smoking status: Never Smoker  . Smokeless tobacco: Never Used  Substance and Sexual Activity  . Alcohol use: No  . Drug use: No  . Sexual activity: Never    Birth control/protection: None  Lifestyle  . Physical activity:    Days per week: Not on file    Minutes per session: Not on file  . Stress: Not on file  Relationships  . Social connections:    Talks on phone: Not on file    Gets together: Not on file    Attends religious service: Not on file    Active member of club or organization: Not on file    Attends meetings of clubs or organizations: Not on file    Relationship status: Not on file  . Intimate partner violence:    Fear of current or ex partner: Not on file    Emotionally abused: Not on file  Physically abused: Not on file    Forced sexual activity: Not on file  Other Topics Concern  . Not on file  Social History Narrative  . Not on file    Family History  Problem Relation Age of Onset  . Lung cancer Brother        colono ca  . Cancer Brother   . Skin cancer Daughter   . Leukemia Father   . Cancer Father   . Colon cancer Mother   . Cancer Mother   . Skin cancer Son   . Kidney disease Neg Hx   . Bladder Cancer Neg Hx      Current Outpatient Medications:  .  colestipol (COLESTID) 1 g tablet, Take 1 tablet by mouth 2 (two) times daily., Disp: , Rfl:  .  Cranberry 500 MG CAPS, Take by mouth daily., Disp: , Rfl:  .  gabapentin (NEURONTIN) 100 MG capsule, Take on tablet each night for two weeks. Increase to two tablets nightly if no improvement., Disp: 90 capsule, Rfl: 6 .  lansoprazole (PREVACID) 30 MG capsule, take 1 capsule by mouth once daily AT 12 NOON, Disp: , Rfl: 0 .  loperamide (IMODIUM A-D) 2 MG tablet, Take 2 mg by mouth as needed for diarrhea or loose stools., Disp: , Rfl:  .  pantoprazole (PROTONIX) 40 MG tablet, Take 1 tablet (40 mg total) by mouth daily., Disp: 90 tablet, Rfl: 3 .  Potassium  Gluconate 595 MG CAPS, Take 1 capsule by mouth daily. , Disp: , Rfl:  .  Probiotic Product (ALIGN) 4 MG CAPS, Take 1 capsule by mouth daily. , Disp: , Rfl:  .  temazepam (RESTORIL) 15 MG capsule, Take 15 mg by mouth at bedtime., Disp: , Rfl:  .  vitamin B-12 (CYANOCOBALAMIN) 1000 MCG tablet, Take 1,000 mcg by mouth daily., Disp: , Rfl:  .  acetaminophen (TYLENOL) 650 MG CR tablet, Take by mouth., Disp: , Rfl:  .  ciprofloxacin (CIPRO) 500 MG tablet, Take 1 tablet (500 mg total) by mouth 2 (two) times daily. (Patient not taking: Reported on 11/01/2017), Disp: 6 tablet, Rfl: 0 .  estradiol (ESTRACE VAGINAL) 0.1 MG/GM vaginal cream, Apply 0.59m (pea-sized amount)  just inside the vaginal introitus with a finger-tip every night for two weeks and then Monday, Wednesday and Friday nights. (Patient not taking: Reported on 11/01/2017), Disp: 30 g, Rfl: 12 .  metoprolol succinate (TOPROL-XL) 25 MG 24 hr tablet, Take 25 mg by mouth daily., Disp: , Rfl:  .  OXYQUINOLONE SULFATE VAGINAL 0.025 % GEL, Place 1 Applicatorful vaginally 2 (two) times a week. (Patient not taking: Reported on 11/01/2017), Disp: 1 Tube, Rfl: 3 .  phenazopyridine (PYRIDIUM) 200 MG tablet, Take 1 tablet (200 mg total) by mouth 3 (three) times daily as needed for pain (urethral spasm). (Patient not taking: Reported on 10/17/2017), Disp: 10 tablet, Rfl: 1 .  triamcinolone (NASACORT ALLERGY 24HR) 55 MCG/ACT AERO nasal inhaler, Place 2 sprays into the nose as needed. , Disp: , Rfl:   Physical exam:  Vitals:   11/01/17 1406  BP: (!) 142/76  Pulse: 84  Resp: 18  Temp: 98.2 F (36.8 C)  TempSrc: Oral  SpO2: 95%  Weight: 154 lb (69.9 kg)  Height: 5' 2" (1.575 m)   Physical Exam  Constitutional: She is oriented to person, place, and time. She appears well-developed and well-nourished.  HENT:  Head: Normocephalic and atraumatic.  Eyes: Pupils are equal, round, and reactive to light. EOM are normal.  Neck: Normal range of motion.    Cardiovascular: Normal rate, regular rhythm and normal heart sounds.  Pulmonary/Chest: Effort normal and breath sounds normal.  Abdominal: Soft. Bowel sounds are normal.  Neurological: She is alert and oriented to person, place, and time.  Skin: Skin is warm and dry.     CMP Latest Ref Rng & Units 11/27/2016  Glucose 65 - 99 mg/dL 95  BUN 6 - 20 mg/dL 9  Creatinine 0.44 - 1.00 mg/dL 0.63  Sodium 135 - 145 mmol/L 138  Potassium 3.5 - 5.1 mmol/L 3.7  Chloride 101 - 111 mmol/L 107  CO2 22 - 32 mmol/L 25  Calcium 8.9 - 10.3 mg/dL 8.0(L)  Total Protein 6.4 - 8.2 g/dL -  Total Bilirubin 0.2 - 1.0 mg/dL -  Alkaline Phos 50 - 136 Unit/L -  AST 15 - 37 Unit/L -  ALT 12 - 78 U/L -   CBC Latest Ref Rng & Units 11/01/2017  WBC 3.6 - 11.0 K/uL 9.1  Hemoglobin 12.0 - 16.0 g/dL 13.8  Hematocrit 35.0 - 47.0 % 40.8  Platelets 150 - 440 K/uL 332     Assessment and plan- Patient is a 84 y.o. female with iron deficiency anemia  She is not anemic today. Iron studies are wnl. Will aim to keep ferritin closer to 50. She is asymptomatic. Repeat cbc ferritin and iron studies in 3 and 6 months and I will see her in 6 months   Visit Diagnosis 1. Iron deficiency anemia, unspecified iron deficiency anemia type      Dr.  , MD, MPH [facility] [phone] 11/02/2017 12:18 PM                

## 2017-11-08 ENCOUNTER — Ambulatory Visit (INDEPENDENT_AMBULATORY_CARE_PROVIDER_SITE_OTHER): Payer: Medicare Other | Admitting: General Surgery

## 2017-11-08 ENCOUNTER — Encounter: Payer: Self-pay | Admitting: General Surgery

## 2017-11-08 VITALS — BP 142/78 | HR 76 | Resp 14 | Ht 65.0 in | Wt 155.0 lb

## 2017-11-08 DIAGNOSIS — Z853 Personal history of malignant neoplasm of breast: Secondary | ICD-10-CM | POA: Diagnosis not present

## 2017-11-08 NOTE — Patient Instructions (Signed)
The patient is aware to call back for any questions or concerns.  

## 2017-11-08 NOTE — Progress Notes (Signed)
Patient ID: [name], female   DOB: 10-Oct-1920, 84 y.o.   MRN: [id number]  Chief Complaint  Patient presents with  . Breast Cancer    HPI [name] is a 84 y.o. female here today for her one year breast cancer. Patient states she is doing well.  Daughter, [name] is present at visit.  HPI  Past Medical History:  Diagnosis Date  . Allergy   . Arthritis    back  . Benign neoplasm of skin, site unspecified   . Bowel trouble   . Breast cancer ([place])   . Cancer ([place])    colon 20 years ago  . Cancer of breast [facility]) April 10,.2013   Left breast, 3.8 cm, 4 cm axillary node, T2, N1a, ER negative PR negative, HER-2/neu not amplified.  . Cataract   . Colon cancer ([place])   . Hard of hearing   . Heartburn   . Hiatal hernia   . History of stomach ulcers   . History of stomach ulcers   . Hypertension 2012  . Personal history of malignant neoplasm of breast 2013   LEFT MASTECTOMY,left modified radical mastectomy on September 06, 2011 483.8 cm primary tumor with a 4.6 cm axillary metastasis. 3 of 13 nodes were positive. T2,N1a lesion. This was a triple negative lesion  . Shingles Dec 2015  . Vaginal atrophy 08/10/2015    Past Surgical History:  Procedure Laterality Date  . ABDOMINAL HYSTERECTOMY     42 YEARS AGO  . BREAST SURGERY Left 09-06-11   left modified radical mastectomy on September 06, 2011 483.8 cm primary tumor with a 4.6 cm axillary metastasis. 3 of 13 nodes were positive. T2,N1a lesion. This was a triple negative lesion  . CARPAL TUNNEL RELEASE Right September 25, 2014   Skip Estimable, M.D.  . COLON RESECTION  1985  . COLON SURGERY  20 YEARS AGO  . COLONOSCOPY  2012   DR. ELLIOTT  . COLONOSCOPY WITH PROPOFOL N/A 08/25/2016   Procedure: COLONOSCOPY WITH PROPOFOL;  Surgeon: [name], MD;  Location[facility] ENDOSCOPY;  Service: Gastroenterology;  Laterality: N/A;  . ESOPHAGOGASTRODUODENOSCOPY (EGD) WITH PROPOFOL N/A 08/23/2016   Procedure: ESOPHAGOGASTRODUODENOSCOPY (EGD) WITH  PROPOFOL;  Surgeon: [name], MD;  Location: [facility] ENDOSCOPY;  Service: Endoscopy;  Laterality: N/A;  . MASTECTOMY Left 2013   left modified radical mastectomy on September 06, 2011 Stage 2; 3.8 cm primary tumor with a 4.6 cm axillary metastasis. 3 of 13 nodes were positive. T2,N1a lesion. This was a triple negative lesion  . UPPER GI ENDOSCOPY  2012   BLEEDING    Family History  Problem Relation Age of Onset  . Lung cancer Brother        colono ca  . Cancer Brother   . Skin cancer Daughter   . Leukemia Father   . Cancer Father   . Colon cancer Mother   . Cancer Mother   . Skin cancer Son   . Kidney disease Neg Hx   . Bladder Cancer Neg Hx     Social History Social History   Tobacco Use  . Smoking status: Never Smoker  . Smokeless tobacco: Never Used  Substance Use Topics  . Alcohol use: No  . Drug use: No    Allergies  Allergen Reactions  . Codeine Nausea And Vomiting    Current Outpatient Medications  Medication Sig Dispense Refill  . acetaminophen (TYLENOL) 650 MG CR tablet Take by mouth.    . ciprofloxacin (CIPRO) 500  MG tablet Take 1 tablet (500 mg total) by mouth 2 (two) times daily. (Patient not taking: Reported on 11/01/2017) 6 tablet 0  . colestipol (COLESTID) 1 g tablet Take 1 tablet by mouth 2 (two) times daily.    . Cranberry 500 MG CAPS Take by mouth daily.    Marland Kitchen estradiol (ESTRACE VAGINAL) 0.1 MG/GM vaginal cream Apply 0.50m (pea-sized amount)  just inside the vaginal introitus with a finger-tip every night for two weeks and then Monday, Wednesday and Friday nights. (Patient not taking: Reported on 11/01/2017) 30 g 12  . gabapentin (NEURONTIN) 100 MG capsule Take on tablet each night for two weeks. Increase to two tablets nightly if no improvement. 90 capsule 6  . lansoprazole (PREVACID) 30 MG capsule take 1 capsule by mouth once daily AT 12 NOON  0  . loperamide (IMODIUM A-D) 2 MG tablet Take 2 mg by mouth as needed for diarrhea or loose stools.    . metoprolol  succinate (TOPROL-XL) 25 MG 24 hr tablet Take 25 mg by mouth daily.    .Levin ErpSULFATE VAGINAL 0.025 % GEL Place 1 Applicatorful vaginally 2 (two) times a week. (Patient not taking: Reported on 11/01/2017) 1 Tube 3  . pantoprazole (PROTONIX) 40 MG tablet Take 1 tablet (40 mg total) by mouth daily. 90 tablet 3  . phenazopyridine (PYRIDIUM) 200 MG tablet Take 1 tablet (200 mg total) by mouth 3 (three) times daily as needed for pain (urethral spasm). (Patient not taking: Reported on 10/17/2017) 10 tablet 1  . Potassium Gluconate 595 MG CAPS Take 1 capsule by mouth daily.     . Probiotic Product (ALIGN) 4 MG CAPS Take 1 capsule by mouth daily.     . temazepam (RESTORIL) 15 MG capsule Take 15 mg by mouth at bedtime.    . triamcinolone (NASACORT ALLERGY 24HR) 55 MCG/ACT AERO nasal inhaler Place 2 sprays into the nose as needed.     . vitamin B-12 (CYANOCOBALAMIN) 1000 MCG tablet Take 1,000 mcg by mouth daily.     No current facility-administered medications for this visit.     Review of Systems Review of Systems  Constitutional: Negative.   Respiratory: Negative.   Cardiovascular: Negative.     Blood pressure (!) 142/78, pulse 76, resp. rate 14, height '5\' 5"'  (1.651 m), weight 155 lb (70.3 kg).  Physical Exam Physical Exam  Constitutional: She appears well-developed and well-nourished.  HENT:  Mouth/Throat: Oropharynx is clear and moist. No oropharyngeal exudate.  Eyes: Conjunctivae are normal. No scleral icterus.  Neck: Neck supple.  Cardiovascular: Normal rate, regular rhythm and normal heart sounds.  Pulmonary/Chest: Effort normal and breath sounds normal. Right breast exhibits no inverted nipple, no mass, no nipple discharge, no skin change and no tenderness.  Left mastectomy site well healed.    Lymphadenopathy:    She has no cervical adenopathy.  Neurological: She is alert.  Skin: Skin is warm and dry.  Psychiatric: Her behavior is normal.    Data Reviewed Patient has  declined additional mammograms.  Assessment    No evidence of recurrence of her T2, N1a triple negative tumor from 2013.    Plan  Follow up in one year. The patient is aware to call back for any questions or new concerns.    HPI, Physical Exam, Assessment and Plan have been scribed under the direction and in the presence of JRobert Bellow MD. MKarie Fetch RN  I have completed the exam and reviewed the above documentation for accuracy  and completeness.  I agree with the above.  Haematologist has been used and any errors in dictation or transcription are unintentional.  Hervey Ard, M.D., F.A.C.S.  Forest Gleason Jermell Holeman 11/10/2017, 11:46 AM

## 2017-11-16 ENCOUNTER — Encounter: Payer: Self-pay | Admitting: Oncology

## 2017-11-19 ENCOUNTER — Other Ambulatory Visit: Payer: Self-pay | Admitting: Oncology

## 2017-11-19 ENCOUNTER — Encounter: Payer: Self-pay | Admitting: *Deleted

## 2017-11-19 DIAGNOSIS — D509 Iron deficiency anemia, unspecified: Secondary | ICD-10-CM

## 2017-12-27 ENCOUNTER — Other Ambulatory Visit: Payer: Medicare Other

## 2017-12-31 ENCOUNTER — Other Ambulatory Visit: Payer: Self-pay

## 2017-12-31 ENCOUNTER — Telehealth: Payer: Self-pay | Admitting: Obstetrics and Gynecology

## 2017-12-31 ENCOUNTER — Other Ambulatory Visit: Payer: Medicare Other

## 2017-12-31 ENCOUNTER — Other Ambulatory Visit (INDEPENDENT_AMBULATORY_CARE_PROVIDER_SITE_OTHER): Payer: Medicare Other

## 2017-12-31 DIAGNOSIS — Z87440 Personal history of urinary (tract) infections: Secondary | ICD-10-CM | POA: Diagnosis not present

## 2017-12-31 DIAGNOSIS — R309 Painful micturition, unspecified: Secondary | ICD-10-CM

## 2017-12-31 LAB — POCT URINALYSIS DIPSTICK
BILIRUBIN UA: NEGATIVE
BILIRUBIN UA: NEGATIVE
Glucose, UA: NEGATIVE
Glucose, UA: NEGATIVE
KETONES UA: NEGATIVE
Ketones, UA: NEGATIVE
Nitrite, UA: 1
PROTEIN UA: POSITIVE — AB
PROTEIN UA: POSITIVE — AB
SPEC GRAV UA: 1.015 (ref 1.010–1.025)
Spec Grav, UA: 1.015 (ref 1.010–1.025)
UROBILINOGEN UA: 0.2 U/dL
Urobilinogen, UA: 0.2 E.U./dL
pH, UA: 6 (ref 5.0–8.0)
pH, UA: 6 (ref 5.0–8.0)

## 2017-12-31 NOTE — Telephone Encounter (Signed)
Pt called and informed that she could come by the office and pick up an urine cup and we could check the sample when it is returned. Pt's daughter stated that she would be by around before lunch to pick the urine cup.

## 2017-12-31 NOTE — Telephone Encounter (Signed)
[name] called for her mother stating she thinks she has a UTI and is asking if she can pick up a sterile cup and bring in a specimen or does she need to bring her mother in to do a urine so she can get an antibiotic?  Please advise, thanks.

## 2018-01-01 ENCOUNTER — Other Ambulatory Visit: Payer: Self-pay

## 2018-01-01 MED ORDER — NITROFURANTOIN MONOHYD MACRO 100 MG PO CAPS: 100.0000 mg | ORAL_CAPSULE | Freq: Two times a day (BID) | ORAL | 0 refills | Status: DC

## 2018-01-01 NOTE — Telephone Encounter (Signed)
Pt was called and informed that she as an UTI and medication has been called in for her to [place] to be token BID x 14 days.

## 2018-01-01 NOTE — Telephone Encounter (Signed)
Pt was called and informed that she had an UTI and Macrobid was called in for her infection.

## 2018-01-02 LAB — URINE CULTURE

## 2018-01-10 ENCOUNTER — Inpatient Hospital Stay: Payer: Medicare Other | Attending: Oncology

## 2018-01-10 DIAGNOSIS — D509 Iron deficiency anemia, unspecified: Secondary | ICD-10-CM

## 2018-01-10 DIAGNOSIS — D500 Iron deficiency anemia secondary to blood loss (chronic): Secondary | ICD-10-CM | POA: Diagnosis not present

## 2018-01-10 DIAGNOSIS — K209 Esophagitis, unspecified: Secondary | ICD-10-CM | POA: Insufficient documentation

## 2018-01-10 DIAGNOSIS — Z8719 Personal history of other diseases of the digestive system: Secondary | ICD-10-CM | POA: Insufficient documentation

## 2018-01-10 LAB — CBC WITH DIFFERENTIAL/PLATELET
BASOS ABS: 0.1 10*3/uL (ref 0–0.1)
BASOS PCT: 1 %
Eosinophils Absolute: 0.1 10*3/uL (ref 0–0.7)
Eosinophils Relative: 2 %
HEMATOCRIT: 40.3 % (ref 35.0–47.0)
Hemoglobin: 13.7 g/dL (ref 12.0–16.0)
LYMPHS PCT: 24 %
Lymphs Abs: 1.9 10*3/uL (ref 1.0–3.6)
MCH: 32 pg (ref 26.0–34.0)
MCHC: 34 g/dL (ref 32.0–36.0)
MCV: 94.3 fL (ref 80.0–100.0)
MONO ABS: 0.6 10*3/uL (ref 0.2–0.9)
Monocytes Relative: 7 %
NEUTROS ABS: 5.3 10*3/uL (ref 1.4–6.5)
Neutrophils Relative %: 66 %
PLATELETS: 362 10*3/uL (ref 150–440)
RBC: 4.28 MIL/uL (ref 3.80–5.20)
RDW: 13.9 % (ref 11.5–14.5)
WBC: 8.1 10*3/uL (ref 3.6–11.0)

## 2018-01-10 LAB — IRON AND TIBC
Iron: 81 ug/dL (ref 28–170)
Saturation Ratios: 25 % (ref 10.4–31.8)
TIBC: 326 ug/dL (ref 250–450)
UIBC: 245 ug/dL

## 2018-01-10 LAB — FERRITIN: FERRITIN: 48 ng/mL (ref 11–307)

## 2018-01-23 ENCOUNTER — Encounter: Payer: Self-pay | Admitting: Obstetrics and Gynecology

## 2018-01-23 ENCOUNTER — Ambulatory Visit (INDEPENDENT_AMBULATORY_CARE_PROVIDER_SITE_OTHER): Payer: Medicare Other | Admitting: Obstetrics and Gynecology

## 2018-01-23 VITALS — BP 147/81 | HR 84 | Ht 64.0 in | Wt 155.9 lb

## 2018-01-23 DIAGNOSIS — R32 Unspecified urinary incontinence: Secondary | ICD-10-CM

## 2018-01-23 DIAGNOSIS — Z4689 Encounter for fitting and adjustment of other specified devices: Secondary | ICD-10-CM

## 2018-01-23 DIAGNOSIS — R5382 Chronic fatigue, unspecified: Secondary | ICD-10-CM | POA: Diagnosis not present

## 2018-01-23 NOTE — Progress Notes (Signed)
    GYNECOLOGY PROGRESS NOTE  Subjective:    Patient ID: [name], female    DOB: 1921-03-03, 84 y.o.   MRN: [id number]    Patient is a 84 y.o. [id number] female who presents for pessary check.  Pessary currently in place for cystocele with vaginal prolapse. and  Denies vaginal bleeding or abnormal discharge.  She denies difficulty voiding or passing stools.    Of note, patient's daughter notes that patient has been more lethargic over the past few months. Has seen Hematologist for f/u of anemia, which has essentially resolved.  Patient has also been taken off of sleeping pills (which she has been on for 15 years), is now taking Melatonin.  Patient's energy levels have returned some, but still not to previous levels.   Patient and patient's daughter also desires to for Mercer to be checked for a UTI as it sometimes presents with vague symptoms of fatigue, and not always with dysuria. She has a h/o recurrent UTI's.   The following portions of the patient's history were reviewed and updated as appropriate: allergies, current medications, past family history, past medical history, past social history, past surgical history and problem list.  Review of Systems A comprehensive review of systems was negative.   Objective:   Blood pressure (!) 147/81, pulse 84, height 5\' 4"  (1.626 m), weight 155 lb 14.4 oz (70.7 kg). General appearance: alert and no distress Abdomen: soft, non-tender; bowel sounds normal; no masses,  no organomegaly.  Pelvic:  The patient's size 5 pessary was removed, cleaned and replaced without complications.  Speculum examination revealed normal vaginal mucosa with no lesions or lacerations   Labs:   Results for orders placed or performed in visit on 01/23/18  POCT urinalysis dipstick  Result Value Ref Range   Color, UA yellow    Clarity, UA clear    Glucose, UA Negative Negative   Bilirubin, UA neg    Ketones, UA neg    Spec Grav, UA 1.010 1.010 - 1.025   Blood,  UA neg    pH, UA 6.0 5.0 - 8.0   Protein, UA Negative Negative   Urobilinogen, UA 0.2 0.2 or 1.0 E.U./dL   Nitrite, UA neg    Leukocytes, UA Negative Negative   Appearance yellow    Odor       Assessment:   Pessary maintenance H/o recurrent UTIs.  Cystocele with vaginal vault prolapse Vaginal atrophy (moderate)  Fatigue  Plan:    - Pessary cleaned today, reinserted.  The patient should return in 8-10  weeks for a pessary check.  Continue to use Trimo-San gel internally once or twice weekly, and continue to use Premarin cream for external use near urethral meatus for h/o recurrent UTIs.   - UA performed today - Fatigue, unexplained. Now with normalized iron studies.  Discussed possibility of vitamin deficiency, thyroid issues.  Will check Vitamin B12 and Vitamin D levels, as well as TSH.     Rubie Maid, MD [facility]

## 2018-01-23 NOTE — Progress Notes (Signed)
Pt is present today for a pessary check. Pt stated that she is doing well.

## 2018-01-24 ENCOUNTER — Telehealth: Payer: Self-pay

## 2018-01-24 ENCOUNTER — Encounter: Payer: Self-pay | Admitting: Obstetrics and Gynecology

## 2018-01-24 LAB — POCT URINALYSIS DIPSTICK
Bilirubin, UA: NEGATIVE
Blood, UA: NEGATIVE
GLUCOSE UA: NEGATIVE
KETONES UA: NEGATIVE
LEUKOCYTES UA: NEGATIVE
NITRITE UA: NEGATIVE
PROTEIN UA: NEGATIVE
Spec Grav, UA: 1.01 (ref 1.010–1.025)
Urobilinogen, UA: 0.2 E.U./dL
pH, UA: 6 (ref 5.0–8.0)

## 2018-01-24 NOTE — Telephone Encounter (Signed)
Called pt to let her know that her urine sample was negative.

## 2018-01-28 LAB — VITAMIN D 1,25 DIHYDROXY
Vitamin D 1, 25 (OH)2 Total: 36 pg/mL
Vitamin D2 1, 25 (OH)2: <10 pg/mL
Vitamin D3 1, 25 (OH)2: 36 pg/mL

## 2018-01-28 LAB — VITAMIN B12: Vitamin B-12: 936 pg/mL (ref 232–1245)

## 2018-01-29 ENCOUNTER — Telehealth: Payer: Self-pay | Admitting: Obstetrics and Gynecology

## 2018-01-29 NOTE — Telephone Encounter (Signed)
[name], patient's daughter, called today asking for the results of her blood and urine from 01/23/18.  She is asking for a call back to her due to her mother's bad hearing.  She is on the [facility] for all [facility] Practices.  Please advise, thanks.

## 2018-01-30 NOTE — Telephone Encounter (Signed)
Called pt's daughter informing her via voicemail that her mother's urine sample was negative and was good along with all of her other test results. Pt's daughter was advised to contact the office if she had any questions

## 2018-01-31 ENCOUNTER — Inpatient Hospital Stay: Payer: Medicare Other | Attending: Oncology

## 2018-01-31 ENCOUNTER — Other Ambulatory Visit: Payer: Self-pay

## 2018-01-31 DIAGNOSIS — D509 Iron deficiency anemia, unspecified: Secondary | ICD-10-CM | POA: Insufficient documentation

## 2018-01-31 LAB — IRON AND TIBC
Iron: 52 ug/dL (ref 28–170)
SATURATION RATIOS: 15 % (ref 10.4–31.8)
TIBC: 343 ug/dL (ref 250–450)
UIBC: 291 ug/dL

## 2018-01-31 LAB — FERRITIN: FERRITIN: 28 ng/mL (ref 11–307)

## 2018-01-31 LAB — CBC
HCT: 42.4 % (ref 35.0–47.0)
Hemoglobin: 14.2 g/dL (ref 12.0–16.0)
MCH: 31.5 pg (ref 26.0–34.0)
MCHC: 33.4 g/dL (ref 32.0–36.0)
MCV: 94.1 fL (ref 80.0–100.0)
PLATELETS: 313 10*3/uL (ref 150–440)
RBC: 4.51 MIL/uL (ref 3.80–5.20)
RDW: 13.9 % (ref 11.5–14.5)
WBC: 9.7 10*3/uL (ref 3.6–11.0)

## 2018-01-31 NOTE — Telephone Encounter (Signed)
Pt's daughter was called and informed of her mother's blood work and urine test.

## 2018-01-31 NOTE — Telephone Encounter (Signed)
Pt's daughter was called; the phone rang several times no answer and no voicemail picked up the line just dropped after the phone rung a few times.

## 2018-02-01 ENCOUNTER — Other Ambulatory Visit: Payer: Self-pay | Admitting: Oncology

## 2018-02-11 ENCOUNTER — Inpatient Hospital Stay: Payer: Medicare Other

## 2018-02-11 VITALS — BP 134/70 | HR 82 | Temp 97.6°F | Resp 18

## 2018-02-11 DIAGNOSIS — D509 Iron deficiency anemia, unspecified: Secondary | ICD-10-CM | POA: Diagnosis not present

## 2018-02-11 DIAGNOSIS — D500 Iron deficiency anemia secondary to blood loss (chronic): Secondary | ICD-10-CM

## 2018-02-11 MED ORDER — SODIUM CHLORIDE 0.9 % IV SOLN
Freq: Once | INTRAVENOUS | Status: AC
  Administered 2018-02-11: 14:00:00 via INTRAVENOUS
  Filled 2018-02-11: qty 250

## 2018-02-11 MED ORDER — SODIUM CHLORIDE 0.9 % IV SOLN
510.0000 mg | Freq: Once | INTRAVENOUS | Status: AC
  Administered 2018-02-11: 510 mg via INTRAVENOUS
  Filled 2018-02-11: qty 17

## 2018-02-18 ENCOUNTER — Inpatient Hospital Stay: Payer: Medicare Other

## 2018-02-18 VITALS — BP 144/79 | HR 62 | Temp 96.3°F | Resp 20

## 2018-02-18 DIAGNOSIS — D500 Iron deficiency anemia secondary to blood loss (chronic): Secondary | ICD-10-CM

## 2018-02-18 DIAGNOSIS — D509 Iron deficiency anemia, unspecified: Secondary | ICD-10-CM | POA: Diagnosis not present

## 2018-02-18 MED ORDER — SODIUM CHLORIDE 0.9 % IV SOLN
510.0000 mg | Freq: Once | INTRAVENOUS | Status: AC
  Administered 2018-02-18: 510 mg via INTRAVENOUS
  Filled 2018-02-18: qty 17

## 2018-02-18 MED ORDER — SODIUM CHLORIDE 0.9 % IV SOLN
Freq: Once | INTRAVENOUS | Status: AC
  Administered 2018-02-18: 14:00:00 via INTRAVENOUS
  Filled 2018-02-18: qty 250

## 2018-02-21 ENCOUNTER — Other Ambulatory Visit: Payer: Self-pay | Admitting: Gastroenterology

## 2018-02-21 DIAGNOSIS — K219 Gastro-esophageal reflux disease without esophagitis: Secondary | ICD-10-CM

## 2018-03-07 ENCOUNTER — Telehealth: Payer: Self-pay | Admitting: Obstetrics and Gynecology

## 2018-03-07 ENCOUNTER — Other Ambulatory Visit: Payer: Self-pay

## 2018-03-07 DIAGNOSIS — N390 Urinary tract infection, site not specified: Secondary | ICD-10-CM

## 2018-03-07 LAB — POCT URINALYSIS DIPSTICK
BILIRUBIN UA: NEGATIVE
Glucose, UA: NEGATIVE
KETONES UA: NEGATIVE
NITRITE UA: NEGATIVE
PH UA: 6 (ref 5.0–8.0)
Protein, UA: NEGATIVE
RBC UA: NEGATIVE
UROBILINOGEN UA: 0.2 U/dL

## 2018-03-07 NOTE — Telephone Encounter (Signed)
The patients daughter called and stated that the patient needs to do an urgent urine drop off today and would like a call back as soon as possible. Please advise.

## 2018-03-07 NOTE — Telephone Encounter (Signed)
Pt's daughter was called and informed that her mother's urine did not have any signs of a UTI but her urine was going to be sent for cultures. Pt's daughter stated that was fine. Pt's daughter was also advise to take her mother to see her PCP for the back pain if it increases.

## 2018-03-07 NOTE — Telephone Encounter (Signed)
Spoke with pt's daughter. Daughter [name] stated that her mother had been complaining of back pain and urine smelling really bad. Pt's daughter [name] was advised to drop off some urine to be tested.

## 2018-03-07 NOTE — Addendum Note (Signed)
Addended by: Edwyna Shell on: 03/07/2018 04:42 PM   Modules accepted: Orders

## 2018-03-09 ENCOUNTER — Other Ambulatory Visit: Payer: Self-pay | Admitting: Obstetrics and Gynecology

## 2018-03-09 LAB — URINE CULTURE

## 2018-03-09 MED ORDER — NITROFURANTOIN MONOHYD MACRO 100 MG PO CAPS: 100.0000 mg | ORAL_CAPSULE | Freq: Two times a day (BID) | ORAL | 0 refills | Status: DC

## 2018-03-11 ENCOUNTER — Other Ambulatory Visit: Payer: Self-pay

## 2018-03-11 MED ORDER — NITROFURANTOIN MONOHYD MACRO 100 MG PO CAPS: 100.0000 mg | ORAL_CAPSULE | Freq: Two times a day (BID) | ORAL | 0 refills | Status: DC

## 2018-03-11 NOTE — Telephone Encounter (Signed)
Pt's daughter was called no answer LM via voicemail that her mother had an E. Coli UTI and Macrobid 100 mg BID x 7 days was sent into her pharmacy.

## 2018-04-17 ENCOUNTER — Encounter: Payer: Medicare Other | Admitting: Obstetrics and Gynecology

## 2018-04-17 ENCOUNTER — Encounter: Payer: Self-pay | Admitting: Obstetrics and Gynecology

## 2018-04-17 ENCOUNTER — Ambulatory Visit (INDEPENDENT_AMBULATORY_CARE_PROVIDER_SITE_OTHER): Payer: Medicare Other | Admitting: Obstetrics and Gynecology

## 2018-04-17 VITALS — BP 152/90 | HR 76 | Ht 64.0 in | Wt 154.8 lb

## 2018-04-17 DIAGNOSIS — R399 Unspecified symptoms and signs involving the genitourinary system: Secondary | ICD-10-CM | POA: Diagnosis not present

## 2018-04-17 DIAGNOSIS — Z4689 Encounter for fitting and adjustment of other specified devices: Secondary | ICD-10-CM

## 2018-04-17 DIAGNOSIS — N8111 Cystocele, midline: Secondary | ICD-10-CM

## 2018-04-17 DIAGNOSIS — N952 Postmenopausal atrophic vaginitis: Secondary | ICD-10-CM

## 2018-04-17 NOTE — Progress Notes (Signed)
    GYNECOLOGY PROGRESS NOTE  Subjective:    Patient ID: [name], female    DOB: 1920-11-12, 84 y.o.   MRN: [id number]    Patient is a 84 y.o. [id number] female who presents for pessary check.  Pessary currently in place for cystocele with vaginal prolapse. and  Denies vaginal bleeding or abnormal discharge.  She denies difficulty voiding or passing stools.  Notes she is doing well today, denies complaints.    The following portions of the patient's history were reviewed and updated as appropriate: allergies, current medications, past family history, past medical history, past social history, past surgical history and problem list.  Review of Systems A comprehensive review of systems was negative.   Objective:   Blood pressure (!) 152/90, pulse 76, height 5\' 4"  (1.626 m), weight 154 lb 12.8 oz (70.2 kg). General appearance: alert and no distress Abdomen: soft, non-tender; bowel sounds normal; no masses,  no organomegaly.  Pelvic:  The patient's size 5 pessary was removed, cleaned and replaced without complications.  Speculum examination revealed normal vaginal mucosa with no lesions or lacerations.   Assessment:   Pessary maintenance Cystocele with vaginal vault prolapse Vaginal atrophy (moderate)   Plan:    - Pessary cleaned today, reinserted.  The patient should return in 8-10  weeks for a pessary check.  Continue to use Trimo-San gel internally once or twice weekly, and continue to use Premarin cream for external use near urethral meatus for h/o recurrent UTIs.     Rubie Maid, MD [facility]

## 2018-04-17 NOTE — Progress Notes (Signed)
Pt is present today for pessary check. Pt stated that the pessary is working for her.

## 2018-04-20 ENCOUNTER — Encounter: Payer: Self-pay | Admitting: Obstetrics and Gynecology

## 2018-05-02 ENCOUNTER — Inpatient Hospital Stay: Payer: Medicare Other | Attending: Oncology | Admitting: Oncology

## 2018-05-02 ENCOUNTER — Inpatient Hospital Stay: Payer: Medicare Other

## 2018-05-02 ENCOUNTER — Other Ambulatory Visit: Payer: Self-pay

## 2018-05-02 VITALS — BP 138/75 | HR 77 | Temp 97.5°F | Resp 18 | Wt 154.6 lb

## 2018-05-02 DIAGNOSIS — Z79899 Other long term (current) drug therapy: Secondary | ICD-10-CM

## 2018-05-02 DIAGNOSIS — D509 Iron deficiency anemia, unspecified: Secondary | ICD-10-CM

## 2018-05-02 DIAGNOSIS — Z9012 Acquired absence of left breast and nipple: Secondary | ICD-10-CM

## 2018-05-02 DIAGNOSIS — Z86718 Personal history of other venous thrombosis and embolism: Secondary | ICD-10-CM

## 2018-05-02 DIAGNOSIS — M1990 Unspecified osteoarthritis, unspecified site: Secondary | ICD-10-CM | POA: Diagnosis not present

## 2018-05-02 DIAGNOSIS — Z801 Family history of malignant neoplasm of trachea, bronchus and lung: Secondary | ICD-10-CM | POA: Diagnosis not present

## 2018-05-02 DIAGNOSIS — Z171 Estrogen receptor negative status [ER-]: Secondary | ICD-10-CM

## 2018-05-02 DIAGNOSIS — Z8052 Family history of malignant neoplasm of bladder: Secondary | ICD-10-CM | POA: Diagnosis not present

## 2018-05-02 DIAGNOSIS — I10 Essential (primary) hypertension: Secondary | ICD-10-CM

## 2018-05-02 DIAGNOSIS — Z853 Personal history of malignant neoplasm of breast: Secondary | ICD-10-CM | POA: Diagnosis not present

## 2018-05-02 DIAGNOSIS — Z800 Family history of malignant neoplasm of digestive organs: Secondary | ICD-10-CM | POA: Diagnosis not present

## 2018-05-02 DIAGNOSIS — Z808 Family history of malignant neoplasm of other organs or systems: Secondary | ICD-10-CM

## 2018-05-02 DIAGNOSIS — K209 Esophagitis, unspecified: Secondary | ICD-10-CM | POA: Diagnosis not present

## 2018-05-02 DIAGNOSIS — K449 Diaphragmatic hernia without obstruction or gangrene: Secondary | ICD-10-CM

## 2018-05-02 LAB — CBC
HEMATOCRIT: 44.9 % (ref 36.0–46.0)
HEMOGLOBIN: 14.5 g/dL (ref 12.0–15.0)
MCH: 31 pg (ref 26.0–34.0)
MCHC: 32.3 g/dL (ref 30.0–36.0)
MCV: 96.1 fL (ref 80.0–100.0)
PLATELETS: 289 10*3/uL (ref 150–400)
RBC: 4.67 MIL/uL (ref 3.87–5.11)
RDW: 13.8 % (ref 11.5–15.5)
WBC: 8.8 10*3/uL (ref 4.0–10.5)
nRBC: 0 % (ref 0.0–0.2)

## 2018-05-02 LAB — IRON AND TIBC
IRON: 70 ug/dL (ref 28–170)
Saturation Ratios: 25 % (ref 10.4–31.8)
TIBC: 281 ug/dL (ref 250–450)
UIBC: 211 ug/dL

## 2018-05-02 LAB — FERRITIN: Ferritin: 274 ng/mL (ref 11–307)

## 2018-05-02 NOTE — Progress Notes (Signed)
Hematology/Oncology Consult note [facility]  Telephone:([phone] [phone]  Patient Care Team: [name], MD as PCP - General (Family Medicine) Bary Castilla, Forest Gleason, MD as Consulting Physician (General Surgery) [name], MD as Referring Physician (Family Medicine)   Name of the patient: [name]  [id number]  1921-05-20   Date of visit: 05/02/18  Diagnosis- iron deficiency anemia likely due to GI bleed   Chief complaint/ Reason for visit-routine follow-up of iron deficiency anemia  Heme/Onc history: patient is a 84 year old female with severe iron deficiency anemia. In March 2018 she was admitted for GI bleeding and underwent EGD and colonoscopy. EGD showed mild esophagitis and a large hiatal hernia. Colonoscopy was essentially normal except for an small polyp. She was given blood transfusion and was discharged on oral iron. She has not been able to tolerate oral iron. She was readmitted to the hospital in June 2018 again with severe iron deficiency anemia. She received blood transfusion as well as IV iron. She has not had smallbowel capsule study yet and she is 50 and there were concerns anemia and didn't find evidence of bleeding on capsule study she would not be able to tolerate a push enteroscopy. She has therefore been referred to me for IV iron. She has had urinalysis in the past which has been negative for hematuria. Most recent CBC from 11/28/2016 showed white count of 8.2, H&H of 7.6/24.4 with an MCV of 73.8 and a platelet count of 359  Patient reports dark stools but denies any melanotic tarry stools or bright red blood per rectum. Denies any nosebleeds or gum bleeds. Denies any consistent use of NSAIDs. She does have gradually worsening shortness of breath as well as leg swelling over the last few months. She is independent of her ADLs  patient received 2 doses of feraheme in July 2018. She then presented to her pcp with RLE  pain and swelling. She underwent doppler of RLE which showed:Positive for deep venous thrombosis of the right popliteal vein, and some of the right calf veins.  Patient was started on lovenox and then switched to coumadin and has completed 3 months of anticoagulation  Interval history-she feels good today.  Denies any fatigue.  Denies any blood in her stools or urine.  Denies any dark melanotic stools.  ECOG PS- 1-2 Pain scale- 0 Opioid associated constipation- no  Review of systems- Review of Systems  Constitutional: Negative for chills, fever, malaise/fatigue and weight loss.  HENT: Negative for congestion, ear discharge and nosebleeds.   Eyes: Negative for blurred vision.  Respiratory: Negative for cough, hemoptysis, sputum production, shortness of breath and wheezing.   Cardiovascular: Negative for chest pain, palpitations, orthopnea and claudication.  Gastrointestinal: Negative for abdominal pain, blood in stool, constipation, diarrhea, heartburn, melena, nausea and vomiting.  Genitourinary: Negative for dysuria, flank pain, frequency, hematuria and urgency.  Musculoskeletal: Negative for back pain, joint pain and myalgias.  Skin: Negative for rash.  Neurological: Negative for dizziness, tingling, focal weakness, seizures, weakness and headaches.  Endo/Heme/Allergies: Does not bruise/bleed easily.  Psychiatric/Behavioral: Negative for depression and suicidal ideas. The patient does not have insomnia.       Allergies  Allergen Reactions  . Codeine Nausea And Vomiting     Past Medical History:  Diagnosis Date  . Allergy   . Arthritis    back  . Benign neoplasm of skin, site unspecified   . Bowel trouble   . Breast cancer ([place])   . Cancer (  [place])    colon 20 years ago  . Cancer of breast [facility]) April 10,.2013   Left breast, 3.8 cm, 4 cm axillary node, T2, N1a, ER negative PR negative, HER-2/neu not amplified.  . Cataract   . Colon cancer ([place])   . Hard of hearing   .  Heartburn   . Hiatal hernia   . History of stomach ulcers   . History of stomach ulcers   . Hypertension 2012  . Personal history of malignant neoplasm of breast 2013   LEFT MASTECTOMY,left modified radical mastectomy on September 06, 2011 483.8 cm primary tumor with a 4.6 cm axillary metastasis. 3 of 13 nodes were positive. T2,N1a lesion. This was a triple negative lesion  . Shingles Dec 2015  . Vaginal atrophy 08/10/2015     Past Surgical History:  Procedure Laterality Date  . ABDOMINAL HYSTERECTOMY     42 YEARS AGO  . BREAST SURGERY Left 09-06-11   left modified radical mastectomy on September 06, 2011 483.8 cm primary tumor with a 4.6 cm axillary metastasis. 3 of 13 nodes were positive. T2,N1a lesion. This was a triple negative lesion  . CARPAL TUNNEL RELEASE Right September 25, 2014   Skip Estimable, M.D.  . COLON RESECTION  1985  . COLON SURGERY  20 YEARS AGO  . COLONOSCOPY  2012   DR. ELLIOTT  . COLONOSCOPY WITH PROPOFOL N/A 08/25/2016   Procedure: COLONOSCOPY WITH PROPOFOL;  Surgeon: [name], MD;  Location: [facility] ENDOSCOPY;  Service: Gastroenterology;  Laterality: N/A;  . ESOPHAGOGASTRODUODENOSCOPY (EGD) WITH PROPOFOL N/A 08/23/2016   Procedure: ESOPHAGOGASTRODUODENOSCOPY (EGD) WITH PROPOFOL;  Surgeon: [name], MD;  Location: [facility] ENDOSCOPY;  Service: Endoscopy;  Laterality: N/A;  . MASTECTOMY Left 2013   left modified radical mastectomy on September 06, 2011 Stage 2; 3.8 cm primary tumor with a 4.6 cm axillary metastasis. 3 of 13 nodes were positive. T2,N1a lesion. This was a triple negative lesion  . UPPER GI ENDOSCOPY  2012   BLEEDING    Social History   Socioeconomic History  . Marital status: Widowed    Spouse name: Not on file  . Number of children: Not on file  . Years of education: Not on file  . Highest education level: Not on file  Occupational History  . Not on file  Social Needs  . Financial resource strain: Not on file  . Food insecurity:    Worry: Not on file     Inability: Not on file  . Transportation needs:    Medical: Not on file    Non-medical: Not on file  Tobacco Use  . Smoking status: Never Smoker  . Smokeless tobacco: Never Used  Substance and Sexual Activity  . Alcohol use: No  . Drug use: No  . Sexual activity: Never    Birth control/protection: None  Lifestyle  . Physical activity:    Days per week: Not on file    Minutes per session: Not on file  . Stress: Not on file  Relationships  . Social connections:    Talks on phone: Not on file    Gets together: Not on file    Attends religious service: Not on file    Active member of club or organization: Not on file    Attends meetings of clubs or organizations: Not on file    Relationship status: Not on file  . Intimate partner violence:    Fear of current or ex partner: Not on file    Emotionally  abused: Not on file    Physically abused: Not on file    Forced sexual activity: Not on file  Other Topics Concern  . Not on file  Social History Narrative  . Not on file    Family History  Problem Relation Age of Onset  . Lung cancer Brother        colono ca  . Cancer Brother   . Skin cancer Daughter   . Leukemia Father   . Cancer Father   . Colon cancer Mother   . Cancer Mother   . Skin cancer Son   . Kidney disease Neg Hx   . Bladder Cancer Neg Hx      Current Outpatient Medications:  .  acetaminophen (TYLENOL) 650 MG CR tablet, Take by mouth., Disp: , Rfl:  .  colestipol (COLESTID) 1 g tablet, Take 1 tablet by mouth 2 (two) times daily., Disp: , Rfl:  .  Cranberry 500 MG CAPS, Take by mouth daily., Disp: , Rfl:  .  estradiol (ESTRACE VAGINAL) 0.1 MG/GM vaginal cream, Apply 0.22m (pea-sized amount)  just inside the vaginal introitus with a finger-tip every night for two weeks and then Monday, Wednesday and Friday nights., Disp: 30 g, Rfl: 12 .  ferrous sulfate 325 (65 FE) MG tablet, Take 325 mg by mouth daily with breakfast., Disp: , Rfl:  .  gabapentin (NEURONTIN)  100 MG capsule, Take on tablet each night for two weeks. Increase to two tablets nightly if no improvement., Disp: 90 capsule, Rfl: 6 .  lansoprazole (PREVACID) 30 MG capsule, take 1 capsule by mouth once daily AT 12 NOON, Disp: , Rfl: 0 .  loperamide (IMODIUM A-D) 2 MG tablet, Take 2 mg by mouth as needed for diarrhea or loose stools., Disp: , Rfl:  .  Melatonin 5 MG CAPS, Take by mouth., Disp: , Rfl:  .  metoprolol succinate (TOPROL-XL) 25 MG 24 hr tablet, Take 25 mg by mouth daily., Disp: , Rfl:  .  nitrofurantoin, macrocrystal-monohydrate, (MACROBID) 100 MG capsule, Take 1 capsule (100 mg total) by mouth 2 (two) times daily. (Patient not taking: Reported on 04/17/2018), Disp: 14 capsule, Rfl: 0 .  OXYQUINOLONE SULFATE VAGINAL 0.025 % GEL, Place 1 Applicatorful vaginally 2 (two) times a week., Disp: 1 Tube, Rfl: 3 .  pantoprazole (PROTONIX) 40 MG tablet, TAKE 1 TABLET BY MOUTH ONCE DAILY, Disp: 90 tablet, Rfl: 2 .  phenazopyridine (PYRIDIUM) 200 MG tablet, Take 1 tablet (200 mg total) by mouth 3 (three) times daily as needed for pain (urethral spasm). (Patient not taking: Reported on 10/17/2017), Disp: 10 tablet, Rfl: 1 .  Potassium Gluconate 595 MG CAPS, Take 1 capsule by mouth daily. , Disp: , Rfl:  .  Probiotic Product (ALIGN) 4 MG CAPS, Take 1 capsule by mouth daily. , Disp: , Rfl:  .  temazepam (RESTORIL) 15 MG capsule, Take 15 mg by mouth at bedtime., Disp: , Rfl:  .  triamcinolone (NASACORT ALLERGY 24HR) 55 MCG/ACT AERO nasal inhaler, Place 2 sprays into the nose as needed. , Disp: , Rfl:  .  vitamin B-12 (CYANOCOBALAMIN) 1000 MCG tablet, Take 1,000 mcg by mouth daily., Disp: , Rfl:   Physical exam:  Vitals:   05/02/18 1503  BP: 138/75  Pulse: 77  Resp: 18  Temp: (!) 97.5 F (36.4 C)  TempSrc: Tympanic  Weight: 154 lb 9.6 oz (70.1 kg)   Physical Exam  Constitutional: She is oriented to person, place, and time.  Elderly female in no  acute distress  HENT:  Head: Normocephalic and  atraumatic.  Eyes: Pupils are equal, round, and reactive to light. EOM are normal.  Neck: Normal range of motion.  Cardiovascular: Normal rate, regular rhythm and normal heart sounds.  Pulmonary/Chest: Effort normal and breath sounds normal.  Abdominal: Soft. Bowel sounds are normal.  Neurological: She is alert and oriented to person, place, and time.  Skin: Skin is warm and dry.     CMP Latest Ref Rng & Units 11/27/2016  Glucose 65 - 99 mg/dL 95  BUN 6 - 20 mg/dL 9  Creatinine 0.44 - 1.00 mg/dL 0.63  Sodium 135 - 145 mmol/L 138  Potassium 3.5 - 5.1 mmol/L 3.7  Chloride 101 - 111 mmol/L 107  CO2 22 - 32 mmol/L 25  Calcium 8.9 - 10.3 mg/dL 8.0(L)  Total Protein 6.4 - 8.2 g/dL -  Total Bilirubin 0.2 - 1.0 mg/dL -  Alkaline Phos 50 - 136 Unit/L -  AST 15 - 37 Unit/L -  ALT 12 - 78 U/L -   CBC Latest Ref Rng & Units 05/02/2018  WBC 4.0 - 10.5 K/uL 8.8  Hemoglobin 12.0 - 15.0 g/dL 14.5  Hematocrit 36.0 - 46.0 % 44.9  Platelets 150 - 400 K/uL 289      Assessment and plan- Patient is a 84 y.o. female with iron deficiency anemia possibly secondary to GI bleed  Patient's hemoglobin has remained stable between 13-14 over the last 6 months.  Her hemoglobin is 14.5 today.  Her iron studies are pending but I do not foresee that she will need IV iron at this time.  She does tend to drop her iron levels intermittently requiring IV iron.  I will therefore continue to monitor her CBC ferritin and iron studies at 3 in 6 months and see her back in 6 months   Visit Diagnosis 1. Iron deficiency anemia, unspecified iron deficiency anemia type      Dr. Randa Evens, MD, MPH [facility] at [facility] [phone] 05/02/2018 2:59 PM

## 2018-05-02 NOTE — Progress Notes (Signed)
Here for follow up per pt  Doing " ok " here w daughter. Pulse ox 96% on RA -denies sob.  Energy good she stated.

## 2018-05-03 ENCOUNTER — Encounter: Payer: Self-pay | Admitting: Oncology

## 2018-06-05 LAB — POCT URINALYSIS DIPSTICK
Bilirubin, UA: NEGATIVE
Blood, UA: NEGATIVE
Glucose, UA: NEGATIVE
Ketones, UA: NEGATIVE
Nitrite, UA: NEGATIVE
Protein, UA: NEGATIVE
Spec Grav, UA: 1.02 (ref 1.010–1.025)
Urobilinogen, UA: 0.2 E.U./dL
pH, UA: 6 (ref 5.0–8.0)

## 2018-06-05 NOTE — Addendum Note (Signed)
Addended by: Edwyna Shell on: 06/05/2018 05:06 PM   Modules accepted: Orders

## 2018-06-06 ENCOUNTER — Telehealth: Payer: Self-pay

## 2018-06-06 NOTE — Telephone Encounter (Signed)
Spoke with pt's daughter due to urine being dropped off at the office for testing and cultures. Pt's daughter is aware that urine results were negative but her urine was sent to the lab for cultures. Pt's daughter was informed that as soon as the results return that she would be receiving a call.

## 2018-06-09 LAB — URINE CULTURE

## 2018-07-18 ENCOUNTER — Encounter: Payer: Medicare Other | Admitting: Obstetrics and Gynecology

## 2018-07-18 ENCOUNTER — Encounter: Payer: Self-pay | Admitting: Obstetrics and Gynecology

## 2018-07-18 ENCOUNTER — Ambulatory Visit: Payer: Medicare Other | Admitting: Obstetrics and Gynecology

## 2018-07-18 NOTE — Progress Notes (Unsigned)
Pt is present today for pessary check. Pt stated that her pessary is working well. No problems.

## 2018-08-01 ENCOUNTER — Inpatient Hospital Stay: Payer: Medicare Other

## 2018-08-05 ENCOUNTER — Ambulatory Visit (INDEPENDENT_AMBULATORY_CARE_PROVIDER_SITE_OTHER): Payer: Medicare Other | Admitting: Obstetrics and Gynecology

## 2018-08-05 ENCOUNTER — Inpatient Hospital Stay: Payer: Medicare Other | Attending: Oncology

## 2018-08-05 ENCOUNTER — Encounter: Payer: Self-pay | Admitting: Obstetrics and Gynecology

## 2018-08-05 ENCOUNTER — Other Ambulatory Visit: Payer: Self-pay

## 2018-08-05 VITALS — BP 141/71 | HR 72 | Ht 64.0 in | Wt 155.3 lb

## 2018-08-05 DIAGNOSIS — Z4689 Encounter for fitting and adjustment of other specified devices: Secondary | ICD-10-CM | POA: Diagnosis not present

## 2018-08-05 DIAGNOSIS — Z87440 Personal history of urinary (tract) infections: Secondary | ICD-10-CM | POA: Diagnosis not present

## 2018-08-05 DIAGNOSIS — N952 Postmenopausal atrophic vaginitis: Secondary | ICD-10-CM

## 2018-08-05 DIAGNOSIS — D509 Iron deficiency anemia, unspecified: Secondary | ICD-10-CM | POA: Insufficient documentation

## 2018-08-05 DIAGNOSIS — N814 Uterovaginal prolapse, unspecified: Secondary | ICD-10-CM | POA: Diagnosis not present

## 2018-08-05 LAB — CBC
HCT: 42.8 % (ref 36.0–46.0)
Hemoglobin: 14.5 g/dL (ref 12.0–15.0)
MCH: 32.4 pg (ref 26.0–34.0)
MCHC: 33.9 g/dL (ref 30.0–36.0)
MCV: 95.5 fL (ref 80.0–100.0)
Platelets: 275 10*3/uL (ref 150–400)
RBC: 4.48 MIL/uL (ref 3.87–5.11)
RDW: 13.1 % (ref 11.5–15.5)
WBC: 8.5 10*3/uL (ref 4.0–10.5)
nRBC: 0 % (ref 0.0–0.2)

## 2018-08-05 LAB — IRON AND TIBC
Iron: 115 ug/dL (ref 28–170)
Saturation Ratios: 40 % — ABNORMAL HIGH (ref 10.4–31.8)
TIBC: 285 ug/dL (ref 250–450)
UIBC: 170 ug/dL

## 2018-08-05 LAB — FERRITIN: Ferritin: 284 ng/mL (ref 11–307)

## 2018-08-05 NOTE — Progress Notes (Signed)
    GYNECOLOGY PROGRESS NOTE  Subjective:    Patient ID: CARLOTTA TELFAIR, female    DOB: June 18, 1920, 85 y.o.   MRN: [id number]    Patient is a 85 y.o. [id number] female who presents for pessary check.  Pessary currently in place for cystocele with vaginal prolapse. Also with vaginal atrophy. Denies vaginal bleeding or abnormal discharge.  She denies difficulty voiding or passing stools.  Notes she is doing well today, denies complaints.    The following portions of the patient's history were reviewed and updated as appropriate: allergies, current medications, past family history, past medical history, past social history, past surgical history and problem list.  Review of Systems A comprehensive review of systems was negative.   Objective:   Blood pressure (!) 141/71, pulse 72, height 5\' 4"  (1.626 m), weight 155 lb 4.8 oz (70.4 kg). General appearance: alert and no distress Abdomen: soft, non-tender; bowel sounds normal; no masses,  no organomegaly.  Pelvic:  The patient's size 5 pessary was removed, cleaned and replaced without complications.  Speculum examination revealed normal vaginal mucosa with no lesions or lacerations.   Assessment:   Pessary maintenance Cystocele with vaginal vault prolapse Vaginal atrophy (moderate)  H/o recurrent UTI's .  Plan:    - Pessary cleaned today, reinserted.  The patient should return in 3 months for a pessary check.  Continue to use Trimo-San gel internally once or twice weekly, and continue to use Premarin cream for external use  for h/o recurrent UTIs.     Rubie Maid, MD [facility]

## 2018-08-05 NOTE — Progress Notes (Signed)
Pt present today for f/u for pessary maintenance. Pt stated that she was doing well and had no problems or concerns to report at this time.

## 2018-08-06 ENCOUNTER — Other Ambulatory Visit: Payer: Self-pay | Admitting: Family Medicine

## 2018-08-06 DIAGNOSIS — M25552 Pain in left hip: Secondary | ICD-10-CM

## 2018-08-08 ENCOUNTER — Ambulatory Visit
Admission: RE | Admit: 2018-08-08 | Discharge: 2018-08-08 | Disposition: A | Discharge status: Home / Self Care | Payer: Medicare Other | Source: Ambulatory Visit | Attending: Family Medicine | Admitting: Family Medicine

## 2018-08-08 ENCOUNTER — Other Ambulatory Visit: Payer: Self-pay

## 2018-08-08 DIAGNOSIS — M25552 Pain in left hip: Secondary | ICD-10-CM

## 2018-08-08 MED ORDER — METHYLPREDNISOLONE ACETATE 40 MG/ML INJ SUSP (RADIOLOG
120.0000 mg | Freq: Once | INTRAMUSCULAR | Status: AC
  Administered 2018-08-08: 120 mg via INTRA_ARTICULAR

## 2018-08-08 MED ORDER — IOPAMIDOL (ISOVUE-M 200) INJECTION 41%
1.0000 mL | Freq: Once | INTRAMUSCULAR | Status: AC
  Administered 2018-08-08: 1 mL via INTRA_ARTICULAR

## 2018-09-26 ENCOUNTER — Other Ambulatory Visit: Payer: Self-pay | Admitting: Family Medicine

## 2018-09-26 DIAGNOSIS — M545 Low back pain, unspecified: Secondary | ICD-10-CM

## 2018-10-24 ENCOUNTER — Other Ambulatory Visit: Payer: Self-pay

## 2018-10-24 ENCOUNTER — Ambulatory Visit
Admission: RE | Admit: 2018-10-24 | Discharge: 2018-10-24 | Disposition: A | Discharge status: Home / Self Care | Payer: Medicare Other | Source: Ambulatory Visit | Attending: Family Medicine | Admitting: Family Medicine

## 2018-10-24 DIAGNOSIS — M545 Low back pain, unspecified: Secondary | ICD-10-CM

## 2018-10-24 MED ORDER — IOPAMIDOL (ISOVUE-M 200) INJECTION 41%
1.0000 mL | Freq: Once | INTRAMUSCULAR | Status: AC
  Administered 2018-10-24: 15:00:00 1 mL via EPIDURAL

## 2018-10-24 MED ORDER — METHYLPREDNISOLONE ACETATE 40 MG/ML INJ SUSP (RADIOLOG
120.0000 mg | Freq: Once | INTRAMUSCULAR | Status: AC
  Administered 2018-10-24: 120 mg via EPIDURAL

## 2018-10-24 NOTE — Discharge Instructions (Signed)

## 2018-11-01 ENCOUNTER — Other Ambulatory Visit: Payer: Medicare Other

## 2018-11-01 ENCOUNTER — Ambulatory Visit: Payer: Medicare Other | Admitting: Oncology

## 2018-11-04 ENCOUNTER — Telehealth: Payer: Self-pay | Admitting: Gastroenterology

## 2018-11-04 ENCOUNTER — Other Ambulatory Visit
Admission: RE | Admit: 2018-11-04 | Discharge: 2018-11-04 | Disposition: A | Discharge status: Home / Self Care | Payer: Medicare Other | Source: Ambulatory Visit | Attending: Gastroenterology | Admitting: Gastroenterology

## 2018-11-04 ENCOUNTER — Other Ambulatory Visit: Payer: Self-pay

## 2018-11-04 DIAGNOSIS — R197 Diarrhea, unspecified: Secondary | ICD-10-CM | POA: Diagnosis present

## 2018-11-04 DIAGNOSIS — A082 Adenoviral enteritis: Secondary | ICD-10-CM | POA: Insufficient documentation

## 2018-11-04 DIAGNOSIS — A045 Campylobacter enteritis: Secondary | ICD-10-CM | POA: Insufficient documentation

## 2018-11-04 LAB — C DIFFICILE QUICK SCREEN W PCR REFLEX
C Diff antigen: NEGATIVE
C Diff interpretation: NOT DETECTED
C Diff toxin: NEGATIVE

## 2018-11-04 NOTE — Telephone Encounter (Signed)
Pt son is calling stating he dropped of pt stool sample and she is still having uncontrollable Diarrhea she is weak he wants to Thank Dr. Vicente Males  For helping pt.

## 2018-11-04 NOTE — Telephone Encounter (Signed)
Noted email if having severe diarrhea  1. Get stool tests for C diff and GI PCR 2. IF abnormal , will treat  3. If needed I will find a spot to see her after stool tests before end of the week

## 2018-11-04 NOTE — Telephone Encounter (Signed)
Pt's son, Percell Miller notified to go and pick up stool containers from [facility] lab. He has been advised of Dr. Georgeann Oppenheim recommendation if positive stool or negative.

## 2018-11-04 NOTE — Telephone Encounter (Signed)
Pt son is calling very concerned about pt having severe Diarrhea and nausea  pt is also a pt of  Dr. Hedwig Morton  Informed pt Dr. Vicente Males is on call this week no available apt until next week he asked to receive a call today from nurse for Advise  Son's name Savi Lastinger cb [phone]

## 2018-11-05 ENCOUNTER — Telehealth: Payer: Self-pay

## 2018-11-05 LAB — GASTROINTESTINAL PANEL BY PCR, STOOL (REPLACES STOOL CULTURE)
Adenovirus F40/41: DETECTED — AB
Astrovirus: NOT DETECTED
Campylobacter species: DETECTED — AB
Cryptosporidium: NOT DETECTED
Cyclospora cayetanensis: NOT DETECTED
Entamoeba histolytica: NOT DETECTED
Enteroaggregative E coli (EAEC): NOT DETECTED
Enteropathogenic E coli (EPEC): NOT DETECTED
Enterotoxigenic E coli (ETEC): NOT DETECTED
Giardia lamblia: NOT DETECTED
Norovirus GI/GII: NOT DETECTED
Plesimonas shigelloides: NOT DETECTED
Rotavirus A: NOT DETECTED
Salmonella species: NOT DETECTED
Sapovirus (I, II, IV, and V): NOT DETECTED
Shiga like toxin producing E coli (STEC): NOT DETECTED
Shigella/Enteroinvasive E coli (EIEC): NOT DETECTED
Vibrio cholerae: NOT DETECTED
Vibrio species: NOT DETECTED
Yersinia enterocolitica: NOT DETECTED

## 2018-11-05 NOTE — Telephone Encounter (Signed)
[name]  Inform has Campylobacter and Adeno virus in stool. This explains the diarrhea.   Usually campylobacter gets better on its own in a few days. IF this has not been getting better then suggest azithromycin is 500 mg orally daily for three days .   Adeno virus is a viral illness and has no treatment and resolves on own. Advise to drink adequate fluids .  Check on the patient in 2 days . If patient gets worse to call us back asap    Dr [name] MD,MRCP [facility]) Gastroenterology/Hepatology Pager: [phone]

## 2018-11-05 NOTE — Progress Notes (Deleted)
Pt is present today for pessary check. Pt stated

## 2018-11-05 NOTE — Telephone Encounter (Signed)
Check on her tomorrow, if continues to improve hold on antibiotics. If not better then start. Hold on colistipol till this gets Better

## 2018-11-05 NOTE — Telephone Encounter (Signed)
Spoke with [name] and informed him of Dr. Georgeann Oppenheim suggestions. I explained that I will call back tomorrow for updates.

## 2018-11-05 NOTE — Telephone Encounter (Signed)
Spoke with pt's daughter. Pt has no symptoms and has face mask. Due to age someone will be bringing her.    Coronavirus (COVID-19) Are you at risk?  Are you at risk for the Coronavirus (COVID-19)?  To be considered HIGH RISK for Coronavirus (COVID-19), you have to meet the following criteria:  . Traveled to [place], [place], [place], [place] or [place]; or in the [place] to [place], [place], [place], or [place]; and have fever, cough, and shortness of breath within the last 2 weeks of travel OR . Been in close contact with a person diagnosed with COVID-19 within the last 2 weeks and have fever, cough, and shortness of breath . IF YOU DO NOT MEET THESE CRITERIA, YOU ARE CONSIDERED LOW RISK FOR COVID-19.  What to do if you are HIGH RISK for COVID-19?  Marland Kitchen If you are having a medical emergency, call 911. . Seek medical care right away. Before you go to a doctor's office, urgent care or emergency department, call ahead and tell them about your recent travel, contact with someone diagnosed with COVID-19, and your symptoms. You should receive instructions from your physician's office regarding next steps of care.  . When you arrive at healthcare provider, tell the healthcare staff immediately you have returned from visiting [place], [place], [place], [place] or [place]; or traveled in the [place] to [place], [place], [place], or [place]; in the last two weeks or you have been in close contact with a person diagnosed with COVID-19 in the last 2 weeks.   . Tell the health care staff about your symptoms: fever, cough and shortness of breath. . After you have been seen by a medical provider, you will be either: o Tested for (COVID-19) and discharged home on quarantine except to seek medical care if symptoms worsen, and asked to  - Stay home and avoid contact with others until you get your results (4-5 days)  - Avoid travel on public transportation if possible (such as bus, train,  or airplane) or o Sent to the Emergency Department by EMS for evaluation, COVID-19 testing, and possible admission depending on your condition and test results.  What to do if you are LOW RISK for COVID-19?  Reduce your risk of any infection by using the same precautions used for avoiding the common cold or flu:  Marland Kitchen Wash your hands often with soap and warm water for at least 20 seconds.  If soap and water are not readily available, use an alcohol-based hand sanitizer with at least 60% alcohol.  . If coughing or sneezing, cover your mouth and nose by coughing or sneezing into the elbow areas of your shirt or coat, into a tissue or into your sleeve (not your hands). . Avoid shaking hands with others and consider head nods or verbal greetings only. . Avoid touching your eyes, nose, or mouth with unwashed hands.  . Avoid close contact with people who are sick. . Avoid places or events with large numbers of people in one location, like concerts or sporting events. . Carefully consider travel plans you have or are making. . If you are planning any travel outside or inside the [place], visit the CDC's Travelers' Health webpage for the latest health notices. . If you have some symptoms but not all symptoms, continue to monitor at home and seek medical attention if your symptoms worsen. . If you are having a medical emergency, call 911.   ADDITIONAL HEALTHCARE OPTIONS FOR PATIENTS  [place] / e-Visit: [email or web address]         [facility]: [facility]: [phone]                   [facility]: [phone]

## 2018-11-05 NOTE — Telephone Encounter (Signed)
Called pt son, Percell Miller, to inform him of pt stool test results and Dr. Georgeann Oppenheim directions.  Unable to contact, LVM to return call

## 2018-11-06 ENCOUNTER — Telehealth: Payer: Self-pay | Admitting: Gastroenterology

## 2018-11-06 ENCOUNTER — Other Ambulatory Visit: Payer: Self-pay

## 2018-11-06 ENCOUNTER — Encounter: Payer: Medicare Other | Admitting: Obstetrics and Gynecology

## 2018-11-06 MED ORDER — DIPHENHYDRAMINE HCL 12.5 MG/5ML PO SYRP: 12.5000 mg | ORAL_SOLUTION | Freq: Every evening | ORAL | 0 refills | Status: DC | PRN

## 2018-11-06 MED ORDER — AZITHROMYCIN 500 MG PO TABS: 500.0000 mg | ORAL_TABLET | Freq: Every day | ORAL | 0 refills | Status: AC

## 2018-11-06 NOTE — Telephone Encounter (Signed)
Called pt son, Percell Miller, to inform him of Dr. Georgeann Oppenheim instructions for pt to commence on Azithromycin 500 mg daily for 3 days. Prescription has been sent to pt preferred pharmacy.  Unable to contact, LVM to return call

## 2018-11-06 NOTE — Telephone Encounter (Signed)
She is doing better, some nausea yesterday, he will call me and update me again tomorrow through the office. Is aware to keep fluid intake and electrolytes adequate. Requests script for low dose benadryl PRN

## 2018-11-06 NOTE — Telephone Encounter (Signed)
Benadryl 12.5 script requested

## 2018-11-06 NOTE — Telephone Encounter (Signed)
Think we need to start on the Azithromycin - can you call it in please . Lets touch base with her in 24 hours , continue oral fluids

## 2018-11-07 ENCOUNTER — Other Ambulatory Visit: Payer: Self-pay

## 2018-11-07 MED ORDER — PROMETHAZINE HCL 12.5 MG RE SUPP: 12.5000 mg | Freq: Every day | RECTAL | 0 refills | Status: DC | PRN

## 2018-11-07 NOTE — Telephone Encounter (Signed)
Yes PRN imodium, start zofran 4 mg  PRN Q 12 hourly for nausea

## 2018-11-07 NOTE — Telephone Encounter (Signed)
Spoke with pt son, Percell Miller, and informed him of Dr. Georgeann Oppenheim directions for pt to continue the immodium as needed for diarrhea. Mr. Percell Miller states they were able to purchase the phenergan suppositories.

## 2018-11-07 NOTE — Telephone Encounter (Signed)
Ok, noted, continue antibiotics, encourage fluids, send script for phenargan supp to be used once daily PRN for nausea. Check on her tomorrow. IF oral intake not keeoing up or looks more sick will need to be admitted please inform

## 2018-11-11 ENCOUNTER — Other Ambulatory Visit: Payer: Self-pay

## 2018-11-11 ENCOUNTER — Ambulatory Visit: Payer: Medicare Other | Admitting: Oncology

## 2018-11-11 ENCOUNTER — Inpatient Hospital Stay: Payer: Medicare Other | Attending: Oncology

## 2018-11-11 ENCOUNTER — Telehealth: Payer: Self-pay | Admitting: *Deleted

## 2018-11-11 ENCOUNTER — Telehealth: Payer: Self-pay | Admitting: Gastroenterology

## 2018-11-11 ENCOUNTER — Other Ambulatory Visit: Payer: Medicare Other

## 2018-11-11 DIAGNOSIS — R0602 Shortness of breath: Secondary | ICD-10-CM | POA: Insufficient documentation

## 2018-11-11 DIAGNOSIS — D509 Iron deficiency anemia, unspecified: Secondary | ICD-10-CM | POA: Insufficient documentation

## 2018-11-11 DIAGNOSIS — Z7901 Long term (current) use of anticoagulants: Secondary | ICD-10-CM | POA: Diagnosis not present

## 2018-11-11 DIAGNOSIS — R5381 Other malaise: Secondary | ICD-10-CM | POA: Diagnosis not present

## 2018-11-11 DIAGNOSIS — R5383 Other fatigue: Secondary | ICD-10-CM | POA: Diagnosis not present

## 2018-11-11 DIAGNOSIS — Z171 Estrogen receptor negative status [ER-]: Secondary | ICD-10-CM | POA: Insufficient documentation

## 2018-11-11 DIAGNOSIS — Z9012 Acquired absence of left breast and nipple: Secondary | ICD-10-CM | POA: Insufficient documentation

## 2018-11-11 DIAGNOSIS — E785 Hyperlipidemia, unspecified: Secondary | ICD-10-CM | POA: Insufficient documentation

## 2018-11-11 DIAGNOSIS — Z853 Personal history of malignant neoplasm of breast: Secondary | ICD-10-CM | POA: Insufficient documentation

## 2018-11-11 DIAGNOSIS — M7989 Other specified soft tissue disorders: Secondary | ICD-10-CM | POA: Diagnosis not present

## 2018-11-11 DIAGNOSIS — R531 Weakness: Secondary | ICD-10-CM

## 2018-11-11 DIAGNOSIS — Z86718 Personal history of other venous thrombosis and embolism: Secondary | ICD-10-CM | POA: Diagnosis not present

## 2018-11-11 DIAGNOSIS — I10 Essential (primary) hypertension: Secondary | ICD-10-CM | POA: Insufficient documentation

## 2018-11-11 DIAGNOSIS — Z79899 Other long term (current) drug therapy: Secondary | ICD-10-CM | POA: Diagnosis not present

## 2018-11-11 LAB — CBC WITH DIFFERENTIAL/PLATELET
Abs Immature Granulocytes: 0.09 10*3/uL — ABNORMAL HIGH (ref 0.00–0.07)
Basophils Absolute: 0.1 10*3/uL (ref 0.0–0.1)
Basophils Relative: 1 %
Eosinophils Absolute: 0.2 10*3/uL (ref 0.0–0.5)
Eosinophils Relative: 1 %
HCT: 41.3 % (ref 36.0–46.0)
Hemoglobin: 13.3 g/dL (ref 12.0–15.0)
Immature Granulocytes: 1 %
Lymphocytes Relative: 23 %
Lymphs Abs: 2.8 10*3/uL (ref 0.7–4.0)
MCH: 31 pg (ref 26.0–34.0)
MCHC: 32.2 g/dL (ref 30.0–36.0)
MCV: 96.3 fL (ref 80.0–100.0)
Monocytes Absolute: 1.1 10*3/uL — ABNORMAL HIGH (ref 0.1–1.0)
Monocytes Relative: 8 %
Neutro Abs: 8.2 10*3/uL — ABNORMAL HIGH (ref 1.7–7.7)
Neutrophils Relative %: 66 %
Platelets: 428 10*3/uL — ABNORMAL HIGH (ref 150–400)
RBC: 4.29 MIL/uL (ref 3.87–5.11)
RDW: 12.9 % (ref 11.5–15.5)
WBC: 12.5 10*3/uL — ABNORMAL HIGH (ref 4.0–10.5)
nRBC: 0 % (ref 0.0–0.2)

## 2018-11-11 LAB — SAMPLE TO BLOOD BANK

## 2018-11-11 LAB — IRON AND TIBC
Iron: 39 ug/dL (ref 28–170)
Saturation Ratios: 18 % (ref 10.4–31.8)
TIBC: 223 ug/dL — ABNORMAL LOW (ref 250–450)
UIBC: 184 ug/dL

## 2018-11-11 LAB — FERRITIN: Ferritin: 436 ng/mL — ABNORMAL HIGH (ref 11–307)

## 2018-11-11 NOTE — Telephone Encounter (Signed)
Ok. Labs cbc ferritin and iron studies today. Move her video visit with me this week to thursday

## 2018-11-11 NOTE — Addendum Note (Signed)
Addended by: Betti Cruz on: 11/11/2018 03:07 PM   Modules accepted: Orders

## 2018-11-11 NOTE — Telephone Encounter (Signed)
Patient's daughter [name]  called stating patient was feeling better while on the medications. Now she has completed the antibiotics she is feeling very bad  & very week. Having diarrhea with blood in stool  & dehydrated she has been giving her pedi sure & imodium. Please call & advise!!

## 2018-11-11 NOTE — Telephone Encounter (Signed)
Appointment accepted for tomorrow at 2 by her son.

## 2018-11-11 NOTE — Telephone Encounter (Signed)
Labs and see me tomorrow at 2 pm

## 2018-11-11 NOTE — Telephone Encounter (Signed)
Son Ed called reporting that patient has gotten worse and that she is having labored breathing and is very weak ike her blood is done, Asking to bring her in for lab check today. Please advise

## 2018-11-12 ENCOUNTER — Other Ambulatory Visit: Payer: Self-pay | Admitting: Gastroenterology

## 2018-11-12 ENCOUNTER — Other Ambulatory Visit: Payer: Self-pay

## 2018-11-12 ENCOUNTER — Encounter: Payer: Self-pay | Admitting: Oncology

## 2018-11-12 ENCOUNTER — Inpatient Hospital Stay (HOSPITAL_BASED_OUTPATIENT_CLINIC_OR_DEPARTMENT_OTHER): Payer: Medicare Other | Admitting: Oncology

## 2018-11-12 ENCOUNTER — Inpatient Hospital Stay: Payer: Medicare Other

## 2018-11-12 VITALS — BP 163/94 | HR 87 | Temp 97.0°F | Resp 16 | Ht 64.0 in

## 2018-11-12 DIAGNOSIS — D509 Iron deficiency anemia, unspecified: Secondary | ICD-10-CM

## 2018-11-12 DIAGNOSIS — K219 Gastro-esophageal reflux disease without esophagitis: Secondary | ICD-10-CM

## 2018-11-12 DIAGNOSIS — R5383 Other fatigue: Secondary | ICD-10-CM

## 2018-11-12 DIAGNOSIS — R5381 Other malaise: Secondary | ICD-10-CM

## 2018-11-12 DIAGNOSIS — R0602 Shortness of breath: Secondary | ICD-10-CM

## 2018-11-12 DIAGNOSIS — R531 Weakness: Secondary | ICD-10-CM

## 2018-11-12 DIAGNOSIS — R600 Localized edema: Secondary | ICD-10-CM | POA: Diagnosis not present

## 2018-11-12 DIAGNOSIS — Z7901 Long term (current) use of anticoagulants: Secondary | ICD-10-CM

## 2018-11-12 DIAGNOSIS — Z79899 Other long term (current) drug therapy: Secondary | ICD-10-CM

## 2018-11-12 MED ORDER — SODIUM CHLORIDE 0.9 % IV SOLN
Freq: Once | INTRAVENOUS | Status: AC
  Administered 2018-11-12: 16:00:00 via INTRAVENOUS
  Filled 2018-11-12: qty 250

## 2018-11-12 NOTE — Progress Notes (Signed)
Pt states that she feels fatigued. Her children feed her and give her plenty of fluids. She has 2 diarrhea stools yest. With some form. But loose and she took imodium. Today she has not had any stools. She gets sob on exertion when  Getting up and moving. She can't sit in wheelchair and wanted a bed. We do not have beds but offered her a recliner

## 2018-11-13 NOTE — Progress Notes (Signed)
Hematology/Oncology Consult note [facility]  Telephone:([phone] [phone]  Patient Care Team: [name], MD as PCP - General (Family Medicine) Bary Castilla Forest Gleason, MD as Consulting Physician (General Surgery) [name], MD as Referring Physician (Family Medicine)   Name of the patient: [name]  [id number]  12/16/1920   Date of visit: 11/13/18  Diagnosis- iron deficiency anemia  Chief complaint/ Reason for visit- routine f/u of iron deficiency anemia  Heme/Onc history: patient is a 85 year old female with severe iron deficiency anemia. In March 2018 she was admitted for GI bleeding and underwent EGD and colonoscopy. EGD showed mild esophagitis and a large hiatal hernia. Colonoscopy was essentially normal except for an small polyp. She was given blood transfusion and was discharged on oral iron. She has not been able to tolerate oral iron. She was readmitted to the hospital in June 2018 again with severe iron deficiency anemia. She received blood transfusion as well as IV iron. She has not had smallbowel capsule study yet and she is 18 and there were concerns anemia and didn't find evidence of bleeding on capsule study she would not be able to tolerate a push enteroscopy. She has therefore been referred to me for IV iron. She has had urinalysis in the past which has been negative for hematuria. Most recent CBC from 11/28/2016 showed white count of 8.2, H&H of 7.6/24.4 with an MCV of 73.8 and a platelet count of 359  Patient reports dark stools but denies any melanotic tarry stools or bright red blood per rectum. Denies any nosebleeds or gum bleeds. Denies any consistent use of NSAIDs. She does have gradually worsening shortness of breath as well as leg swelling over the last few months. She is independent of her ADLs  patient received 2 doses of feraheme in July 2018. She then presented to her pcp with RLE pain and swelling. She  underwent doppler of RLE which showed:Positive for deep venous thrombosis of the right popliteal vein, and some of the right calf veins.  Patient was started on lovenox and then switched to coumadin and has completed 3 months of anticoagulation  Interval history- she has been having some loose stools and bloating couple of weeks ago. Dr. Vicente Males did stool studies and found she was positive for adenovirus and campylobacter. She was started on azithromycin. She has had some improvement since then but still feels fatigued. Family spoke to me ove rthe phone. She is having good oral intake but does drink enough fluids.   ECOG PS- 2 Pain scale- 0   Review of systems- Review of Systems  Constitutional: Positive for malaise/fatigue. Negative for chills, fever and weight loss.  HENT: Negative for congestion, ear discharge and nosebleeds.   Eyes: Negative for blurred vision.  Respiratory: Negative for cough, hemoptysis, sputum production, shortness of breath and wheezing.   Cardiovascular: Negative for chest pain, palpitations, orthopnea and claudication.  Gastrointestinal: Negative for abdominal pain, blood in stool, constipation, diarrhea, heartburn, melena, nausea and vomiting.       Abdominal bloating  Genitourinary: Negative for dysuria, flank pain, frequency, hematuria and urgency.  Musculoskeletal: Negative for back pain, joint pain and myalgias.  Skin: Negative for rash.  Neurological: Negative for dizziness, tingling, focal weakness, seizures, weakness and headaches.  Endo/Heme/Allergies: Does not bruise/bleed easily.  Psychiatric/Behavioral: Negative for depression and suicidal ideas. The patient does not have insomnia.       Allergies  Allergen Reactions   Codeine Nausea And Vomiting  Past Medical History:  Diagnosis Date   Allergy    Arthritis    back   Benign neoplasm of skin, site unspecified    Bowel trouble    Breast cancer ([place])    Cancer ([place])    colon 20  years ago   Cancer of breast ([place]) April 10,.2013   Left breast, 3.8 cm, 4 cm axillary node, T2, N1a, ER negative PR negative, HER-2/neu not amplified.   Cataract    Colon cancer ([place])    Hard of hearing    Heartburn    Hiatal hernia    History of stomach ulcers    History of stomach ulcers    Hypertension 2012   Personal history of malignant neoplasm of breast 2013   LEFT MASTECTOMY,left modified radical mastectomy on September 06, 2011 483.8 cm primary tumor with a 4.6 cm axillary metastasis. 3 of 13 nodes were positive. T2,N1a lesion. This was a triple negative lesion   Shingles Dec 2015   Vaginal atrophy 08/10/2015     Past Surgical History:  Procedure Laterality Date   ABDOMINAL HYSTERECTOMY     42 YEARS AGO   BREAST SURGERY Left 09-06-11   left modified radical mastectomy on September 06, 2011 483.8 cm primary tumor with a 4.6 cm axillary metastasis. 3 of 13 nodes were positive. T2,N1a lesion. This was a triple negative lesion   CARPAL TUNNEL RELEASE Right September 25, 2014   Skip Estimable, M.D.   COLON RESECTION  1985   COLON SURGERY  [place]  2012   DR. ELLIOTT   COLONOSCOPY WITH PROPOFOL N/A 08/25/2016   Procedure: COLONOSCOPY WITH PROPOFOL;  Surgeon: [name], MD;  [facility] END[facility]COPY;  Service: Gastroenterology;  Laterality: N/A;   ESOPHAGOGASTRODUODENOSCOPY (EGD) WITH PROPOFOL N/A 08/23/2016   Procedure: ESOPHAGOGASTRODUODENOSCOPY (EGD) WITH PROPOFOL;  Surgeon: [name], MD;  Location: [facility] ENDOSCOPY;  Service: Endoscopy;  Laterality: N/A;   MASTECTOMY Left 2013   left modified radical mastectomy on September 06, 2011 Stage 2; 3.8 cm primary tumor with a 4.6 cm axillary metastasis. 3 of 13 nodes were positive. T2,N1a lesion. This was a triple negative lesion   UPPER GI ENDOSCOPY  2012   BLEEDING    Social History   Socioeconomic History   Marital status: Widowed    Spouse name: Not on file   Number of children: Not on file    Years of education: Not on file   Highest education level: Not on file  Occupational History   Not on file  Social Needs   Financial resource strain: Not on file   Food insecurity    Worry: Not on file    Inability: Not on file   Transportation needs    Medical: Not on file    Non-medical: Not on file  Tobacco Use   Smoking status: Never Smoker   Smokeless tobacco: Never Used  Substance and Sexual Activity   Alcohol use: No   Drug use: No   Sexual activity: Never    Birth control/protection: None  Lifestyle   Physical activity    Days per week: Not on file    Minutes per session: Not on file   Stress: Not on file  Relationships   Social connections    Talks on phone: Not on file    Gets together: Not on file    Attends religious service: Not on file    Active member of club or organization: Not on file  Attends meetings of clubs or organizations: Not on file    Relationship status: Not on file   Intimate partner violence    Fear of current or ex partner: Not on file    Emotionally abused: Not on file    Physically abused: Not on file    Forced sexual activity: Not on file  Other Topics Concern   Not on file  Social History Narrative   Not on file    Family History  Problem Relation Age of Onset   Lung cancer Brother        colono ca   Cancer Brother    Skin cancer Daughter    Leukemia Father    Cancer Father    Colon cancer Mother    Cancer Mother    Skin cancer Son    Kidney disease Neg Hx    Bladder Cancer Neg Hx      Current Outpatient Medications:    acetaminophen (TYLENOL) 650 MG CR tablet, Take by mouth., Disp: , Rfl:    Ascorbic Acid (VITAMIN C) 1000 MG tablet, Take 1,000 mg by mouth daily., Disp: , Rfl:    Cholecalciferol (VITAMIN D) 50 MCG (2000 UT) CAPS, Take 1 capsule by mouth daily., Disp: , Rfl:    citalopram (CELEXA) 10 MG tablet, TAKE 1 TABLET BY MOUTH EVERY DAY, Disp: , Rfl:    Cranberry 500 MG CAPS,  Take by mouth daily., Disp: , Rfl:    estradiol (ESTRACE VAGINAL) 0.1 MG/GM vaginal cream, Apply 0.27m (pea-sized amount)  just inside the vaginal introitus with a finger-tip every night for two weeks and then Monday, Wednesday and Friday nights., Disp: 30 g, Rfl: 12   gabapentin (NEURONTIN) 100 MG capsule, Take on tablet each night for two weeks. Increase to two tablets nightly if no improvement. (Patient taking differently: Take 100 mg by mouth 3 (three) times daily. Take on tablet each night for two weeks. Increase to two tablets nightly if no improvement.), Disp: 90 capsule, Rfl: 6   Lactobacillus Rhamnosus, GG, (PROBIOTIC COLIC PO), Take 1 Dose by mouth daily. doterra brands, Disp: , Rfl:    loperamide (IMODIUM A-D) 2 MG tablet, Take 2 mg by mouth as needed for diarrhea or loose stools., Disp: , Rfl:    Melatonin 5 MG CAPS, Take 1 capsule by mouth. , Disp: , Rfl:    metoprolol succinate (TOPROL-XL) 25 MG 24 hr tablet, Take 25 mg by mouth daily., Disp: , Rfl:    OXYQUINOLONE SULFATE VAGINAL 0.025 % GEL, Place 1 Applicatorful vaginally 2 (two) times a week., Disp: 1 Tube, Rfl: 3   pantoprazole (PROTONIX) 40 MG tablet, TAKE 1 TABLET BY MOUTH ONCE DAILY, Disp: 90 tablet, Rfl: 2   Potassium 99 MG TABS, Take 1 tablet by mouth daily., Disp: , Rfl:    vitamin B-12 (CYANOCOBALAMIN) 1000 MCG tablet, Take 1,000 mcg by mouth daily., Disp: , Rfl:    colestipol (COLESTID) 1 g tablet, Take 1 tablet by mouth 2 (two) times daily., Disp: , Rfl:    Probiotic Product (ALIGN) 4 MG CAPS, Take 1 capsule by mouth daily. , Disp: , Rfl:   Physical exam:  Vitals:   11/12/18 1443  BP: (!) 163/94  Pulse: 87  Resp: 16  Temp: (!) 97 F (36.1 C)  TempSrc: Tympanic  SpO2: 96%  Height: _0  (1.626 m)   Physical Exam Constitutional:      Comments: Appears fatigued  HENT:     Head: Normocephalic and atraumatic.  Eyes:     Pupils: Pupils are equal, round, and reactive to light.  Neck:      Musculoskeletal: Normal range of motion.  Cardiovascular:     Rate and Rhythm: Normal rate and regular rhythm.     Heart sounds: Normal heart sounds.  Pulmonary:     Effort: Pulmonary effort is normal.     Breath sounds: Normal breath sounds.  Abdominal:     General: Bowel sounds are normal. There is no distension.     Palpations: Abdomen is soft.     Tenderness: There is no abdominal tenderness.  Skin:    General: Skin is warm and dry.  Neurological:     Mental Status: She is alert and oriented to person, place, and time.      CMP Latest Ref Rng & Units 11/27/2016  Glucose 65 - 99 mg/dL 95  BUN 6 - 20 mg/dL 9  Creatinine 0.44 - 1.00 mg/dL 0.63  Sodium 135 - 145 mmol/L 138  Potassium 3.5 - 5.1 mmol/L 3.7  Chloride 101 - 111 mmol/L 107  CO2 22 - 32 mmol/L 25  Calcium 8.9 - 10.3 mg/dL 8.0(L)  Total Protein 6.4 - 8.2 g/dL -  Total Bilirubin 0.2 - 1.0 mg/dL -  Alkaline Phos 50 - 136 Unit/L -  AST 15 - 37 Unit/L -  ALT 12 - 78 U/L -   CBC Latest Ref Rng & Units 11/11/2018  WBC 4.0 - 10.5 K/uL 12.5(H)  Hemoglobin 12.0 - 15.0 g/dL 13.3  Hematocrit 36.0 - 46.0 % 41.3  Platelets 150 - 400 K/uL 428(H)    No images are attached to the encounter.  Dg Inject Diag/thera/inc Needle/cath/plc Epi/lumb/sac W/img  Result Date: 10/24/2018 CLINICAL DATA:  Lumbosacral spondylosis without myelopathy. Left hip pain. Good response to prior epidural injections. The patient reports no significant improvement following the intra-articular left hip injection in 07/2018. FLUOROSCOPY TIME:  Fluoroscopy Time: 7 seconds Radiation Exposure Index: 18.03 microGray*m^2 PROCEDURE: The procedure, risks, benefits, and alternatives were explained to the patient. Questions regarding the procedure were encouraged and answered. The patient understands and consents to the procedure. LUMBAR EPIDURAL INJECTION: An interlaminar approach was performed on the left at L4-5. The overlying skin was cleansed and anesthetized. A  3.5 inch 20 gauge epidural needle was advanced using loss-of-resistance technique. DIAGNOSTIC EPIDURAL INJECTION: Injection of Isovue-M 200 shows a good epidural pattern with spread above and below the level of needle placement, primarily on the left. No vascular opacification is seen. THERAPEUTIC EPIDURAL INJECTION: 100 mg of Depo-Medrol mixed with 1.25 mL of 1% lidocaine were instilled. The patient experienced significant discomfort with the injection and could not tolerate a larger injection volume. The patient was discharged thirty minutes following the injection in good condition. COMPLICATIONS: None IMPRESSION: Technically successful lumbar interlaminar epidural injection on the left at L4-5. Electronically Signed   By: Logan Bores M.D.   On: 10/24/2018 15:27     Assessment and plan- Patient is a 85 y.o. female with iron deficiency anemia  She is not anemic and iron studies are normal. No need for IV iron at this time. Repeat cbc ferritin and iron studies in 3 and 6 months. I will see her in 6 months.  Fatigue- likely due to ongoing campylobacter infection. S/p azithromycin by GI. I will give her 1L IVF today. Family knows they can call us if they feel she is dehydrated over next few weeks.  Family has been updated   Visit Diagnosis 1. Weakness  2. Iron deficiency anemia, unspecified iron deficiency anemia type      Dr. Randa Evens, MD, MPH [facility] at [facility] [phone] 11/13/2018 11:22 AM

## 2018-11-14 ENCOUNTER — Ambulatory Visit: Payer: Medicare Other | Admitting: Oncology

## 2018-11-14 ENCOUNTER — Ambulatory Visit: Payer: Medicare Other | Admitting: General Surgery

## 2018-11-20 ENCOUNTER — Encounter: Payer: Self-pay | Admitting: Obstetrics and Gynecology

## 2018-11-20 ENCOUNTER — Ambulatory Visit (INDEPENDENT_AMBULATORY_CARE_PROVIDER_SITE_OTHER): Payer: Medicare Other | Admitting: Obstetrics and Gynecology

## 2018-11-20 ENCOUNTER — Other Ambulatory Visit: Payer: Self-pay

## 2018-11-20 VITALS — BP 110/76 | HR 60 | Ht 64.0 in | Wt 150.0 lb

## 2018-11-20 DIAGNOSIS — N952 Postmenopausal atrophic vaginitis: Secondary | ICD-10-CM | POA: Diagnosis not present

## 2018-11-20 DIAGNOSIS — N814 Uterovaginal prolapse, unspecified: Secondary | ICD-10-CM

## 2018-11-20 DIAGNOSIS — Z4689 Encounter for fitting and adjustment of other specified devices: Secondary | ICD-10-CM | POA: Diagnosis not present

## 2018-11-20 DIAGNOSIS — Z87440 Personal history of urinary (tract) infections: Secondary | ICD-10-CM

## 2018-11-20 LAB — POCT URINALYSIS DIPSTICK
Bilirubin, UA: NEGATIVE
Blood, UA: NEGATIVE
Glucose, UA: NEGATIVE
Ketones, UA: NEGATIVE
Leukocytes, UA: NEGATIVE
Nitrite, UA: NEGATIVE
Protein, UA: NEGATIVE
Spec Grav, UA: 1.015 (ref 1.010–1.025)
Urobilinogen, UA: 0.2 E.U./dL
pH, UA: 6.5 (ref 5.0–8.0)

## 2018-11-20 NOTE — Progress Notes (Signed)
    GYNECOLOGY PROGRESS NOTE  Subjective:    Patient ID: [name], female    DOB: 05-01-1921, 85 y.o.   MRN: [id number]    Patient is a 85 y.o. [id number] female who presents for pessary check.  Pessary currently in place for cystocele with vaginal prolapse. Also with vaginal atrophy. Denies vaginal bleeding or abnormal discharge.  She denies difficulty voiding or passing stools.  Notes she is doing well today, denies complaints.   Of note, patient;s daughter states she was sick for over a month with gastroenteritis, dehydration. Is just now doing somewhat better.   Patient also desires to have urine checked due to her history of UTI's. Denies any dysuria or hematuria.   The following portions of the patient's history were reviewed and updated as appropriate: allergies, current medications, past family history, past medical history, past social history, past surgical history and problem list.  Review of Systems A comprehensive review of systems was negative.   Objective:   Blood pressure 110/76, pulse 60, height 5\' 4"  (1.626 m), weight 150 lb (68 kg). General appearance: alert and no distress Abdomen: soft, non-tender; bowel sounds normal; no masses,  no organomegaly.  Pelvic:  The patient's size 5 pessary was removed, cleaned and replaced without complications.  Speculum examination revealed normal vaginal mucosa with no lesions or lacerations.    Labs:  Results for orders placed or performed in visit on 11/20/18  POCT urinalysis dipstick  Result Value Ref Range   Color, UA yellow    Clarity, UA clear    Glucose, UA Negative Negative   Bilirubin, UA neg    Ketones, UA neg    Spec Grav, UA 1.015 1.010 - 1.025   Blood, UA neg    pH, UA 6.5 5.0 - 8.0   Protein, UA Negative Negative   Urobilinogen, UA 0.2 0.2 or 1.0 E.U./dL   Nitrite, UA neg    Leukocytes, UA Negative Negative   Appearance yellow    Odor      Assessment:   Pessary maintenance Cystocele with vaginal  vault prolapse Vaginal atrophy (moderate)  H/o recurrent UTI's  Plan:    - Pessary cleaned today, reinserted.  The patient should return in 3 months for a pessary check.  Continue to use Trimo-San gel internally once or twice weekly, and continue to use Premarin cream for external use  for h/o recurrent UTIs.   - UA performed today. Patient with h/o recurrent UTI's and asymptomatic bacteriuria.  Will stll send culture.    Rubie Maid, MD [facility]

## 2018-11-20 NOTE — Progress Notes (Signed)
Pt is present today for pessary check. Pt stated that the pessary is doing well, but are having some other issues with her health.

## 2018-11-21 ENCOUNTER — Other Ambulatory Visit: Payer: Medicare Other

## 2018-11-21 ENCOUNTER — Ambulatory Visit: Payer: Medicare Other | Admitting: Oncology

## 2018-11-23 LAB — URINE CULTURE: Organism ID, Bacteria: NO GROWTH

## 2018-11-28 ENCOUNTER — Telehealth: Payer: Self-pay

## 2018-11-28 NOTE — Telephone Encounter (Signed)
Pt's daughter called no answer left informing pt's daughter of her mother's test results.

## 2018-12-03 ENCOUNTER — Other Ambulatory Visit: Payer: Self-pay

## 2018-12-03 ENCOUNTER — Encounter: Payer: Self-pay | Admitting: General Surgery

## 2018-12-03 ENCOUNTER — Ambulatory Visit (INDEPENDENT_AMBULATORY_CARE_PROVIDER_SITE_OTHER): Payer: Medicare Other | Admitting: General Surgery

## 2018-12-03 VITALS — BP 163/67 | HR 90 | Temp 95.2°F | Ht 64.0 in | Wt 148.0 lb

## 2018-12-03 DIAGNOSIS — S91002A Unspecified open wound, left ankle, initial encounter: Secondary | ICD-10-CM

## 2018-12-03 DIAGNOSIS — S81002A Unspecified open wound, left knee, initial encounter: Secondary | ICD-10-CM

## 2018-12-03 DIAGNOSIS — Z853 Personal history of malignant neoplasm of breast: Secondary | ICD-10-CM | POA: Diagnosis not present

## 2018-12-03 DIAGNOSIS — S81802A Unspecified open wound, left lower leg, initial encounter: Secondary | ICD-10-CM | POA: Diagnosis not present

## 2018-12-03 NOTE — Progress Notes (Signed)
Patient ID: [name], female   DOB: 09-Feb-1921, 85 y.o.   MRN: [id number]  Chief Complaint  Patient presents with  . Follow-up    Breast Cancer,  1 yr f/u rec hx breast ca, no mammos    HPI [name] is a 85 y.o. female.  Here for her follow up breast cancer. She is here with her daughter, [name]. She has had some Gi issues with diarrhea and resulting dehydration.  She suffered an injury to the skin of the left proximal anterior calf when pulling up her clothing resulting in loss of the skin layer requiring debridement at the urgent care center.    HPI  Past Medical History:  Diagnosis Date  . Allergy   . Arthritis    back  . Benign neoplasm of skin, site unspecified   . Bowel trouble   . Breast cancer ([place])   . Cancer ([place])    colon 20 years ago  . Cancer of breast [facility]) April 10,.2013   Left breast, 3.8 cm, 4 cm axillary node, T2, N1a, ER negative PR negative, HER-2/neu not amplified.  . Cataract   . Colon cancer ([place])   . Hard of hearing   . Heartburn   . Hiatal hernia   . History of stomach ulcers   . History of stomach ulcers   . Hypertension 2012  . Personal history of malignant neoplasm of breast 2013   LEFT MASTECTOMY,left modified radical mastectomy on September 06, 2011 483.8 cm primary tumor with a 4.6 cm axillary metastasis. 3 of 13 nodes were positive. T2,N1a lesion. This was a triple negative lesion  . Shingles Dec 2015  . Vaginal atrophy 08/10/2015    Past Surgical History:  Procedure Laterality Date  . ABDOMINAL HYSTERECTOMY     42 YEARS AGO  . BREAST SURGERY Left 09-06-11   left modified radical mastectomy on September 06, 2011 483.8 cm primary tumor with a 4.6 cm axillary metastasis. 3 of 13 nodes were positive. T2,N1a lesion. This was a triple negative lesion  . CARPAL TUNNEL RELEASE Right September 25, 2014   Skip Estimable, M.D.  . COLON RESECTION  1985  . COLON SURGERY  20 YEARS AGO  . COLONOSCOPY  2012   DR. ELLIOTT  . COLONOSCOPY WITH PROPOFOL N/A  08/25/2016   Procedure: COLONOSCOPY WITH PROPOFOL;  Surgeon: [name], MD;  Location: [facility] ENDOSCOPY;  Service: Gastroenterology;  Laterality: N/A;  . ESOPHAGOGASTRODUODENOSCOPY (EGD) WITH PROPOFOL N/A 08/23/2016   Procedure: ESOPHAGOGASTRODUODENOSCOPY (EGD) WITH PROPOFOL;  Surgeon: [name], MD;  Location: [facility] ENDOSCOPY;  Service: Endoscopy;  Laterality: N/A;  . MASTECTOMY Left 2013   left modified radical mastectomy on September 06, 2011 Stage 2; 3.8 cm primary tumor with a 4.6 cm axillary metastasis. 3 of 13 nodes were positive. T2,N1a lesion. This was a triple negative lesion  . UPPER GI ENDOSCOPY  2012   BLEEDING    Family History  Problem Relation Age of Onset  . Lung cancer Brother        colono ca  . Cancer Brother   . Skin cancer Daughter   . Leukemia Father   . Cancer Father   . Colon cancer Mother   . Cancer Mother   . Skin cancer Son   . Kidney disease Neg Hx   . Bladder Cancer Neg Hx     Social History Social History   Tobacco Use  . Smoking status: Never Smoker  . Smokeless tobacco: Never Used  Substance  Use Topics  . Alcohol use: No  . Drug use: No    Allergies  Allergen Reactions  . Codeine Nausea And Vomiting    Current Outpatient Medications  Medication Sig Dispense Refill  . acetaminophen (TYLENOL) 650 MG CR tablet Take by mouth.    . Ascorbic Acid (VITAMIN C) 1000 MG tablet Take 1,000 mg by mouth daily.    . Cholecalciferol (VITAMIN D) 50 MCG (2000 UT) CAPS Take 1 capsule by mouth daily.    . citalopram (CELEXA) 10 MG tablet Take 10 mg by mouth daily.    . colestipol (COLESTID) 1 g tablet Take 1 tablet by mouth 2 (two) times daily.    . Cranberry 500 MG CAPS Take by mouth daily.    Marland Kitchen estradiol (ESTRACE VAGINAL) 0.1 MG/GM vaginal cream Apply 0.'5mg'$  (pea-sized amount)  just inside the vaginal introitus with a finger-tip every night for two weeks and then Monday, Wednesday and Friday nights. 30 g 12  . gabapentin (NEURONTIN) 100 MG capsule Take on  tablet each night for two weeks. Increase to two tablets nightly if no improvement. (Patient taking differently: Take 100 mg by mouth 3 (three) times daily. Take on tablet each night for two weeks. Increase to two tablets nightly if no improvement.) 90 capsule 6  . Lactobacillus Rhamnosus, GG, (PROBIOTIC COLIC PO) Take 1 Dose by mouth daily. doterra brands    . loperamide (IMODIUM A-D) 2 MG tablet Take 2 mg by mouth as needed for diarrhea or loose stools.    . Melatonin 5 MG CAPS Take 1 capsule by mouth.     . metoprolol succinate (TOPROL-XL) 25 MG 24 hr tablet Take 25 mg by mouth daily.    Levin Erp SULFATE VAGINAL 0.025 % GEL Place 1 Applicatorful vaginally 2 (two) times a week. 1 Tube 3  . pantoprazole (PROTONIX) 40 MG tablet TAKE 1 TABLET BY MOUTH ONCE DAILY 90 tablet 2  . Potassium 99 MG TABS Take 1 tablet by mouth daily.    . vitamin B-12 (CYANOCOBALAMIN) 1000 MCG tablet Take 1,000 mcg by mouth daily.     No current facility-administered medications for this visit.     Review of Systems Review of Systems  Constitutional: Negative.   Respiratory: Negative.   Cardiovascular: Negative.     Blood pressure (!) 163/67, pulse 90, temperature (!) 95.2 F (35.1 C), temperature source Temporal, height '5\' 4"'$  (1.626 m), weight 148 lb (67.1 kg), SpO2 95 %.  The patient's weight is down 7 pounds from last year.   Physical Exam Physical Exam Constitutional:      Appearance: Normal appearance. She is well-developed.  Eyes:     General: No scleral icterus.    Conjunctiva/sclera: Conjunctivae normal.  Neck:     Musculoskeletal: Neck supple.  Cardiovascular:     Rate and Rhythm: Normal rate and regular rhythm.     Heart sounds: Normal heart sounds.  Pulmonary:     Effort: Pulmonary effort is normal.     Breath sounds: Normal breath sounds.  Chest:     Breasts:        Right: No inverted nipple, mass, nipple discharge, skin change or tenderness.        Left: Absent.     Lymphadenopathy:     Cervical: No cervical adenopathy.     Upper Body:     Right upper body: No supraclavicular or axillary adenopathy.     Left upper body: No supraclavicular or axillary adenopathy.  Skin:  General: Skin is warm and dry.          Comments: Skin tear left lower leg  Neurological:     Mental Status: She is alert and oriented to person, place, and time.  Psychiatric:        Behavior: Behavior normal.      Degloving type injury to the left leg.  Data Reviewed Urgent care notes.  Assessment No evidence of recurrent breast cancer.  Soft tissue trauma to the left leg, should heal spontaneously.  Plan  Vaseline or Neosporin gently applied to the scab to minimize trauma when it separates.  The patient is aware to call back for any questions or new concerns.  Follow up in one year, no mammograms.   HPI, assessment, plan and physical exam has been scribed under the direction and in the presence of Robert Bellow, MD. Karie Fetch, RN  I have completed the exam and reviewed the above documentation for accuracy and completeness.  I agree with the above.  Haematologist has been used and any errors in dictation or transcription are unintentional.  Hervey Ard, M.D., F.A.C.S.   Forest Gleason Zaquan Duffner 12/04/2018, 9:08 PM

## 2018-12-03 NOTE — Patient Instructions (Addendum)
The patient is aware to call back for any questions or new concerns.  

## 2018-12-04 DIAGNOSIS — S81802A Unspecified open wound, left lower leg, initial encounter: Secondary | ICD-10-CM | POA: Insufficient documentation

## 2018-12-04 DIAGNOSIS — S81002A Unspecified open wound, left knee, initial encounter: Secondary | ICD-10-CM | POA: Insufficient documentation

## 2018-12-10 ENCOUNTER — Encounter: Payer: Medicare Other | Attending: Internal Medicine | Admitting: Internal Medicine

## 2018-12-10 ENCOUNTER — Other Ambulatory Visit: Payer: Self-pay

## 2018-12-10 DIAGNOSIS — Z885 Allergy status to narcotic agent status: Secondary | ICD-10-CM | POA: Insufficient documentation

## 2018-12-10 DIAGNOSIS — I10 Essential (primary) hypertension: Secondary | ICD-10-CM | POA: Diagnosis not present

## 2018-12-10 DIAGNOSIS — Z9049 Acquired absence of other specified parts of digestive tract: Secondary | ICD-10-CM | POA: Insufficient documentation

## 2018-12-10 DIAGNOSIS — I872 Venous insufficiency (chronic) (peripheral): Secondary | ICD-10-CM | POA: Diagnosis not present

## 2018-12-10 DIAGNOSIS — Z85038 Personal history of other malignant neoplasm of large intestine: Secondary | ICD-10-CM | POA: Insufficient documentation

## 2018-12-10 DIAGNOSIS — Z853 Personal history of malignant neoplasm of breast: Secondary | ICD-10-CM | POA: Diagnosis not present

## 2018-12-10 DIAGNOSIS — W228XXA Striking against or struck by other objects, initial encounter: Secondary | ICD-10-CM | POA: Insufficient documentation

## 2018-12-10 DIAGNOSIS — L97822 Non-pressure chronic ulcer of other part of left lower leg with fat layer exposed: Secondary | ICD-10-CM | POA: Diagnosis not present

## 2018-12-10 DIAGNOSIS — S81812A Laceration without foreign body, left lower leg, initial encounter: Secondary | ICD-10-CM | POA: Diagnosis not present

## 2018-12-10 NOTE — Progress Notes (Signed)
ORIS, CALMES ([id number]) Visit Report for 12/10/2018 Abuse/Suicide Risk Screen Details Patient Name: [name], [name] Date of Service: 12/10/2018 1:15 PM Medical Record Number: [id number] Patient Account Number: [id number] Date of Birth/Sex: 06-01-1920 (85 y.o. F) Treating RN: Army Melia Primary Care Arrington Bencomo: [name] Other Clinician: Referring Katherine Syme: Kathie Dike Treating Colten Desroches/Extender: Beverly Gust in Treatment: 0 Abuse/Suicide Risk Screen Items Answer ABUSE RISK SCREEN: Has anyone close to you tried to hurt or harm you recentlyo No Do you feel uncomfortable with anyone in your familyo No Has anyone forced you do things that you didnot want to doo No Electronic Signature(s) Signed: 12/10/2018 3:04:02 PM By: Army Melia Entered By: Army Melia on 12/10/2018 13:34:31 [name] ([id number]) -------------------------------------------------------------------------------- Activities of Daily Living Details Patient Name: [name], [name] Date of Service: 12/10/2018 1:15 PM Medical Record Number: [id number] Patient Account Number: [id number] Date of Birth/Sex: 1921-02-01 (85 y.o. F) Treating RN: Army Melia Primary Care Mustapha Colson: [name] Other Clinician: Referring Lawanna Cecere: Kathie Dike Treating Nolawi Kanady/Extender: Beverly Gust in Treatment: 0 Activities of Daily Living Items Answer Activities of Daily Living (Please select one for each item) Drive Automobile Not Able Take Medications Completely Able Use Telephone Completely Able Care for Appearance Completely Able Use Toilet Completely Able Bath / Shower Completely Able Dress Self Completely Able Feed Self Completely Able Walk Completely Able Get In / Out Bed Completely Able Housework Need Assistance Prepare Meals Completely [place] for Self Completely Able Electronic Signature(s) Signed: 12/10/2018 3:04:02 PM By: Army Melia Entered  By: Army Melia on 12/10/2018 13:34:57 [name] ([id number]) -------------------------------------------------------------------------------- Education Screening Details Patient Name: Sandra Pair B. Date of Service: 12/10/2018 1:15 PM Medical Record Number: [id number] Patient Account Number: [id number] Date of Birth/Sex: [birth year] (85 y.o. F) Treating RN: Army Melia Primary Care Allana Shrestha: [name] Other Clinician: Referring Bradford Cazier: Kathie Dike Treating Kiaya Haliburton/Extender: Beverly Gust in Treatment: 0 Primary Learner Assessed: Patient Learning Preferences/Education Level/Primary Language Learning Preference: Explanation, Demonstration, Video Highest Education Level: High School Preferred Language: English Cognitive Barrier Language Barrier: No Translator Needed: No Memory Deficit: No Emotional Barrier: No Cultural/Religious Beliefs Affecting Medical Care: No Physical Barrier Impaired Vision: No Impaired Hearing: No Decreased Hand dexterity: No Knowledge/Comprehension Knowledge Level: High Comprehension Level: High Ability to understand written High instructions: Ability to understand verbal High instructions: Motivation Anxiety Level: Calm Cooperation: Cooperative Education Importance: Acknowledges Need Interest in Health Problems: Asks Questions Perception: Coherent Willingness to Engage in Self- High Management Activities: Readiness to Engage in Self- High Management Activities: Electronic Signature(s) Signed: 12/10/2018 3:04:02 PM By: Army Melia Entered By: Army Melia on 12/10/2018 13:35:31 [name] ([id number]) -------------------------------------------------------------------------------- Fall Risk Assessment Details Patient Name: Sandra Massed. Date of Service: 12/10/2018 1:15 PM Medical Record Number: [id number] Patient Account Number: [id number] Date of Birth/Sex: [birth year] (85 y.o. F) Treating RN: Army Melia Primary Care Charrise Lardner: [name] Other Clinician: Referring Anessa Charley: Kathie Dike Treating Jaelen Gellerman/Extender: Beverly Gust in Treatment: 0 Fall Risk Assessment Items Have you had 2 or more falls in the last 12 monthso 0 No Have you had any fall that resulted in injury in the last 12 monthso 0 No FALLS RISK SCREEN History of falling - immediate or within 3 months 0 No Secondary diagnosis (Do you have 2 or more medical diagnoseso) 0 No Ambulatory aid None/bed rest/wheelchair/nurse 0 No Crutches/cane/walker 0 No Furniture 0 No Intravenous therapy Access/Saline/Heparin Lock 0 No Gait/Transferring Normal/ bed rest/ wheelchair 0 No Weak (short steps with  or without shuffle, stooped but able to lift head while 0 No walking, may seek support from furniture) Impaired (short steps with shuffle, may have difficulty arising from chair, head 0 No down, impaired balance) Mental Status Oriented to own ability 0 No Electronic Signature(s) Signed: 12/10/2018 3:04:02 PM By: Army Melia Entered By: Army Melia on 12/10/2018 13:35:44 [name] ([id number]) -------------------------------------------------------------------------------- Foot Assessment Details Patient Name: [name], [name] Date of Service: 12/10/2018 1:15 PM Medical Record Number: [id number] Patient Account Number: [id number] Date of Birth/Sex: 22-Dec-1920 (85 y.o. F) Treating RN: Army Melia Primary Care Gayl Ivanoff: [name] Other Clinician: Referring Carrine Kroboth: Kathie Dike Treating Dimitra Woodstock/Extender: Beverly Gust in Treatment: 0 Foot Assessment Items Site Locations + = Sensation present, - = Sensation absent, C = Callus, U = Ulcer R = Redness, W = Warmth, M = Maceration, PU = Pre-ulcerative lesion F = Fissure, S = Swelling, D = Dryness Assessment Right: Left: Other Deformity: No No Prior Foot Ulcer: No No Prior Amputation: No No Charcot Joint: No No Ambulatory Status:  Ambulatory Without Help Gait: Steady Electronic Signature(s) Signed: 12/10/2018 3:04:02 PM By: Army Melia Entered By: Army Melia on 12/10/2018 13:36:27 [name] ([id number]) -------------------------------------------------------------------------------- Nutrition Risk Screening Details Patient Name: [name], [name] Date of Service: 12/10/2018 1:15 PM Medical Record Number: [id number] Patient Account Number: [id number] Date of Birth/Sex: [birth year] (85 y.o. F) Treating RN: Army Melia Primary Care Maddock Finigan: [name] Other Clinician: Referring Monaca Wadas: Kathie Dike Treating Nikhil Osei/Extender: Beverly Gust in Treatment: 0 Height (in): 64 Weight (lbs): 148 Body Mass Index (BMI): 25.4 Nutrition Risk Screening Items Score Screening NUTRITION RISK SCREEN: I have an illness or condition that made me change the kind and/or amount of 0 No food I eat I eat fewer than two meals per day 0 No I eat few fruits and vegetables, or milk products 0 No I have three or more drinks of beer, liquor or wine almost every day 0 No I have tooth or mouth problems that make it hard for me to eat 0 No I don't always have enough money to buy the food I need 0 No I eat alone most of the time 0 No I take three or more different prescribed or over-the-counter drugs a day 0 No Without wanting to, I have lost or gained 10 pounds in the last six months 0 No I am not always physically able to shop, cook and/or feed myself 0 No Nutrition Protocols Good Risk Protocol 0 No interventions needed Moderate Risk Protocol High Risk Proctocol Risk Level: Good Risk Score: 0 Electronic Signature(s) Signed: 12/10/2018 3:04:02 PM By: Army Melia Entered By: Army Melia on 12/10/2018 13:35:51

## 2018-12-11 NOTE — Progress Notes (Signed)
PERSEPHONE, SCHRIEVER ([id number]) Visit Report for 12/10/2018 Chief Complaint Document Details Patient Name: [name], [name] Date of Service: 12/10/2018 1:15 PM Medical Record Number: [id number] Patient Account Number: [id number] Date of Birth/Sex: 1920-11-07 (85 y.o. F) Treating RN: Montey Hora Primary Care Provider: [name] Other Clinician: Referring Provider: Kathie Dike Treating Provider/Extender: Beverly Gust in Treatment: 0 Information Obtained from: Patient Chief Complaint Left leg wound, referred by her Surgeon Electronic Signature(s) Signed: 12/10/2018 1:55:10 PM By: Tobi Bastos Entered By: Tobi Bastos on 12/10/2018 13:55:10 Deschene, [name] ([id number]) -------------------------------------------------------------------------------- Debridement Details Patient Name: Sandra Pair B. Date of Service: 12/10/2018 1:15 PM Medical Record Number: [id number] Patient Account Number: [id number] Date of Birth/Sex: [birth year] (85 y.o. F) Treating RN: Montey Hora Primary Care Provider: [name] Other Clinician: Referring Provider: Kathie Dike Treating Provider/Extender: Beverly Gust in Treatment: 0 Debridement Performed for Wound #1 Left,Anterior Lower Leg Assessment: Performed By: Clinician Montey Hora, RN Debridement Type: Chemical/Enzymatic/Mechanical Agent Used: Santyl Level of Consciousness (Pre- Awake and Alert procedure): Pre-procedure Verification/Time Yes - 13:49 Out Taken: Start Time: 13:49 Pain Control: Lidocaine 4% Topical Solution Instrument: Other : tongue blade Bleeding: None End Time: 13:50 Procedural Pain: 0 Post Procedural Pain: 0 Response to Treatment: Procedure was tolerated well Level of Consciousness Awake and Alert (Post-procedure): Post Debridement Measurements of Total Wound Length: (cm) 7.2 Width: (cm) 3 Depth: (cm) 0.1 Volume: (cm) 1.696 Character of Wound/Ulcer Post Debridement:  Improved Post Procedure Diagnosis Same as Pre-procedure Electronic Signature(s) Signed: 12/10/2018 4:11:29 PM By: Tobi Bastos Signed: 12/10/2018 4:43:58 PM By: Montey Hora Entered By: Montey Hora on 12/10/2018 13:50:08 Sachse, [name] ([id number]) -------------------------------------------------------------------------------- HPI Details Patient Name: Sandra Pair B. Date of Service: 12/10/2018 1:15 PM Medical Record Number: [id number] Patient Account Number: [id number] Date of Birth/Sex: [birth year] (85 y.o. F) Treating RN: Montey Hora Primary Care Provider: [name] Other Clinician: Referring Provider: Kathie Dike Treating Provider/Extender: Beverly Gust in Treatment: 0 History of Present Illness HPI Description: 85 year old female who developed a left anterior leg skin tear more than a month ago with a flap on it, this first started out when family were helping her put on a pair of pants and the edge of the pant leg scraped and pulled off in the area of skin on the left anterior shin. She was seen at an urgent care for the skin tear where the used a scissors to cut the loose lying skin flap on top of the wound. To give her course of antibiotics which I believe was for 10 days that she completed almost a month ago. Since then the wound had been scabbed but she experienced a lot of pain in this area family has been using Vaseline on this area after using Neosporin for some time and she is referred to the clinic for further care of the wound with scab on it. Patient denies any fevers chills shakes. She is ambulatory and has family assist her when needed. Patient is at her baseline in terms of her activity level and functional status other than experiencing some pain in the left leg around the scab. ABIs were not obtained today on the left on account of significant pain associated with the scabbing wound Patient has history of breast cancer status post  mastectomy in remission, colon cancer status post colectomy in remission, iron deficiency for which she gets iron infusions, hypertension, she is not on any blood thinners Electronic Signature(s) Signed: 12/10/2018 1:58:06 PM By: Tobi Bastos Entered By: Tobi Bastos on 12/10/2018  13:58:06 [name], [name] ([id number]) -------------------------------------------------------------------------------- Physical Exam Details Patient Name: Face, [name] Date of Service: 12/10/2018 1:15 PM Medical Record Number: [id number] Patient Account Number: [id number] Date of Birth/Sex: 1920-12-13 (85 y.o. F) Treating RN: Montey Hora Primary Care Provider: [name] Other Clinician: Referring Provider: Kathie Dike Treating Provider/Extender: Beverly Gust in Treatment: 0 Constitutional alert and oriented x 3. sitting or standing blood pressure is within target range for patient.. supine blood pressure is within target range for patient.. pulse regular and within target range for patient.Marland Weeks respirations regular, non-labored and within target range for patient.Marland Weeks temperature within target range for patient.. . . Well-nourished and well-hydrated in no acute distress. Eyes conjunctiva clear no eyelid edema noted. pupils equal round and reactive to light and accommodation. Ears, Nose, Mouth, and Throat no gross abnormality of ear auricles or external auditory canals. normal hearing noted during conversation. mucus membranes moist. Neck supple with no LAD noted in anterior or posterior cervical chain. not enlarged. Respiratory normal breathing without difficulty. clear to auscultation bilaterally. Cardiovascular regular rate and rhythm with normal S1, S2. no bruits with no significant JVD. 2+ femoral pulses. 2+ dorsalis pedis/posterior tibialis pulses. no clubbing, cyanosis, significant edema, <3 sec cap refill. Gastrointestinal (GI) soft, non-tender, non-distended, +BS. no  hepatosplenomegaly. no ventral hernia noted. Neurological cranial nerves 2-12 intact. Patient has normal sensation in the feet bilaterally to light touch. Psychiatric this patient is able to make decisions and demonstrates good insight into disease process. Alert and Oriented x 3. pleasant and cooperative. Notes The left anterior shin wound has scab on the surface, the wound surface and the surrounding immediate area is tender to touch. There is slight degree of edema in both legs and venous insufficiency with varicosities. Remainder the skin appears unremarkable There is stasis dermatitis mostly in the right calf area Pulses appear to be palpable in the dorsalis pedis on both sides Capillary refill appears to be adequate Noted area of seborrheic keratoses on left forearm and an area where she picked at 1 of these lesions which is now scabbing Electronic Signature(s) Signed: 12/10/2018 2:00:12 PM By: Tobi Bastos Entered By: Tobi Bastos on 12/10/2018 14:00:11 [name], [name] ([id number]) -------------------------------------------------------------------------------- Physician Orders Details Patient Name: Sandra Pair B. Date of Service: 12/10/2018 1:15 PM Medical Record Number: [id number] Patient Account Number: [id number] Date of Birth/Sex: [birth year] (85 y.o. F) Treating RN: Montey Hora Primary Care Provider: [name] Other Clinician: Referring Provider: Kathie Dike Treating Provider/Extender: Beverly Gust in Treatment: 0 Verbal / Phone Orders: No Diagnosis Coding Wound Cleansing Wound #1 Left,Anterior Lower Leg o Dial antibacterial soap, wash wounds, rinse and pat dry prior to dressing wounds o May Shower, gently pat wound dry prior to applying new dressing. Primary Wound Dressing Wound #1 Left,Anterior Lower Leg o Santyl Ointment Secondary Dressing Wound #1 Left,Anterior Lower Leg o Gauze, ABD and Kerlix/Conform Dressing Change  Frequency Wound #1 Left,Anterior Lower Leg o Change dressing every day. Follow-up Appointments o Return Appointment in 1 week. Patient Medications Allergies: codeine Notifications Medication Indication Start End Santyl 12/10/2018 DOSE 1 - topical 250 unit/gram ointment - 1 ointment topical apply to left leg wound as directed Electronic Signature(s) Signed: 12/10/2018 2:13:02 PM By: Tobi Bastos Entered By: Tobi Bastos on 12/10/2018 14:13:01 [name], [name] ([id number]) -------------------------------------------------------------------------------- Problem List Details Patient Name: Tadlock, [name] Date of Service: 12/10/2018 1:15 PM Medical Record Number: [id number] Patient Account Number: [id number] Date of Birth/Sex: [birth year] (85 y.o. F) Treating RN: Montey Hora Primary Care Provider: Kary Kos,  Jeneen Rinks Other Clinician: Referring Provider: Kathie Dike Treating Provider/Extender: Beverly Gust in Treatment: 0 Active Problems ICD-10 Evaluated Encounter Code Description Active Date Today Diagnosis L97.829 Non-pressure chronic ulcer of other part of left lower leg with 12/10/2018 No Yes unspecified severity I87.2 Venous insufficiency (chronic) (peripheral) 12/10/2018 No Yes Inactive Problems Resolved Problems Electronic Signature(s) Signed: 12/10/2018 1:54:44 PM By: Tobi Bastos Entered By: Tobi Bastos on 12/10/2018 13:54:43 [name], [name] ([id number]) -------------------------------------------------------------------------------- Progress Note Details Patient Name: Ohaver, [name] Date of Service: 12/10/2018 1:15 PM Medical Record Number: [id number] Patient Account Number: [id number] Date of Birth/Sex: [birth year] (85 y.o. F) Treating RN: Montey Hora Primary Care Provider: [name] Other Clinician: Referring Provider: Kathie Dike Treating Provider/Extender: Beverly Gust in Treatment: 0 Subjective Chief  Complaint Information obtained from Patient Left leg wound, referred by her Surgeon History of Present Illness (HPI) 85 year old female who developed a left anterior leg skin tear more than a month ago with a flap on it, this first started out when family were helping her put on a pair of pants and the edge of the pant leg scraped and pulled off in the area of skin on the left anterior shin. She was seen at an urgent care for the skin tear where the used a scissors to cut the loose lying skin flap on top of the wound. To give her course of antibiotics which I believe was for 10 days that she completed almost a month ago. Since then the wound had been scabbed but she experienced a lot of pain in this area family has been using Vaseline on this area after using Neosporin for some time and she is referred to the clinic for further care of the wound with scab on it. Patient denies any fevers chills shakes. She is ambulatory and has family assist her when needed. Patient is at her baseline in terms of her activity level and functional status other than experiencing some pain in the left leg around the scab. ABIs were not obtained today on the left on account of significant pain associated with the scabbing wound Patient has history of breast cancer status post mastectomy in remission, colon cancer status post colectomy in remission, iron deficiency for which she gets iron infusions, hypertension, she is not on any blood thinners Patient History Information obtained from Patient. Allergies codeine (Severity: Severe, Reaction: nausea, LOC) Family History Cancer - Father,Mother,Siblings, No family history of Diabetes, Heart Disease, Hereditary Spherocytosis, Hypertension, Kidney Disease, Lung Disease, Seizures, Stroke, Thyroid Problems, Tuberculosis. Social History Never smoker, Marital Status - Widowed, Alcohol Use - Never, Drug Use - No History, Caffeine Use - Daily - coffee. Medical  History Eyes Patient has history of Cataracts - both Hematologic/Lymphatic Denies history of Anemia, Hemophilia, Human Immunodeficiency Virus, Lymphedema, Sickle Cell Disease Respiratory Denies history of Aspiration, Asthma, Chronic Obstructive Pulmonary Disease (COPD), Pneumothorax, Sleep Apnea, Tuberculosis Cardiovascular Patient has history of Hypertension Denies history of Angina, Arrhythmia, Congestive Heart Failure, Coronary Artery Disease, Deep Vein Thrombosis, Hypotension, Myocardial Infarction, Peripheral Arterial Disease, Peripheral Venous Disease, Phlebitis, Vasculitis Neidhardt, Amalia B. ([id number]) Gastrointestinal Denies history of Cirrhosis , Colitis, Crohn s, Hepatitis A, Hepatitis B, Hepatitis C Endocrine Denies history of Type I Diabetes, Type II Diabetes Genitourinary Denies history of End Stage Renal Disease Immunological Denies history of Lupus Erythematosus, Raynaud s, Scleroderma Integumentary (Skin) Denies history of History of Burn, History of pressure wounds Musculoskeletal Denies history of Gout, Rheumatoid Arthritis, Osteoarthritis Neurologic Denies history of Dementia, Neuropathy, Quadriplegia, Paraplegia, Seizure Disorder  Oncologic Denies history of Received Chemotherapy Psychiatric Denies history of Anorexia/bulimia, Confinement Anxiety Medical And Surgical History Notes Gastrointestinal ulcer issues Review of Systems (ROS) Constitutional Symptoms (General Health) Denies complaints or symptoms of Fatigue, Fever, Chills, Marked Weight Change. Eyes Denies complaints or symptoms of Dry Eyes, Vision Changes, Glasses / Contacts. Ear/Nose/Mouth/Throat Denies complaints or symptoms of Difficult clearing ears, Sinusitis. Hematologic/Lymphatic Denies complaints or symptoms of Bleeding / Clotting Disorders, Human Immunodeficiency Virus. Respiratory Denies complaints or symptoms of Chronic or frequent coughs, Shortness of Breath. Cardiovascular Denies  complaints or symptoms of Chest pain, LE edema. Gastrointestinal Denies complaints or symptoms of Frequent diarrhea, Nausea, Vomiting. Endocrine Denies complaints or symptoms of Hepatitis, Thyroid disease, Polydypsia (Excessive Thirst). Genitourinary Denies complaints or symptoms of Kidney failure/ Dialysis, Incontinence/dribbling. Immunological Denies complaints or symptoms of Hives, Itching. Integumentary (Skin) Complains or has symptoms of Wounds. Denies complaints or symptoms of Bleeding or bruising tendency, Breakdown, Swelling. Musculoskeletal Denies complaints or symptoms of Muscle Pain, Muscle Weakness. Neurologic Denies complaints or symptoms of Numbness/parasthesias, Focal/Weakness. Oncologic left breast removed d/t breast cancer colon cancer Psychiatric Denies complaints or symptoms of Anxiety, Claustrophobia. [name], [name] ([id number]) Objective Constitutional alert and oriented x 3. sitting or standing blood pressure is within target range for patient.. supine blood pressure is within target range for patient.. pulse regular and within target range for patient.Marland Weeks respirations regular, non-labored and within target range for patient.Marland Weeks temperature within target range for patient.. Well-nourished and well-hydrated in no acute distress. Vitals Time Taken: 1:19 PM, Height: 64 in, Source: Stated, Weight: 148 lbs, Source: Stated, BMI: 25.4, Temperature: 98.6 F, Pulse: 67 bpm, Respiratory Rate: 16 breaths/min, Blood Pressure: 148/73 mmHg. Eyes conjunctiva clear no eyelid edema noted. pupils equal round and reactive to light and accommodation. Ears, Nose, Mouth, and Throat no gross abnormality of ear auricles or external auditory canals. normal hearing noted during conversation. mucus membranes moist. Neck supple with no LAD noted in anterior or posterior cervical chain. not enlarged. Respiratory normal breathing without difficulty. clear to auscultation  bilaterally. Cardiovascular regular rate and rhythm with normal S1, S2. no bruits with no significant JVD. 2+ femoral pulses. 2+ dorsalis pedis/posterior tibialis pulses. no clubbing, cyanosis, significant edema, Gastrointestinal (GI) soft, non-tender, non-distended, +BS. no hepatosplenomegaly. no ventral hernia noted. Neurological cranial nerves 2-12 intact. Patient has normal sensation in the feet bilaterally to light touch. Psychiatric this patient is able to make decisions and demonstrates good insight into disease process. Alert and Oriented x 3. pleasant and cooperative. General Notes: The left anterior shin wound has scab on the surface, the wound surface and the surrounding immediate area is tender to touch. There is slight degree of edema in both legs and venous insufficiency with varicosities. Remainder the skin appears unremarkable There is stasis dermatitis mostly in the right calf area Pulses appear to be palpable in the dorsalis pedis on both sides Capillary refill appears to be adequate Noted area of seborrheic keratoses on left forearm and an area where she picked at 1 of these lesions which is now scabbing Integumentary (Hair, Skin) Wound #1 status is Open. Original cause of wound was Other Lesion. The wound is located on the Left,Anterior Lower Leg. The wound measures 7.2cm length x 3cm width x 0.1cm depth; 16.965cm^2 area and 1.696cm^3 volume. The wound is limited to skin breakdown. There is no tunneling or undermining noted. There is a none present amount of drainage noted. There is small (1-33%) red granulation within the wound bed. There is a large (67-100%)  amount of necrotic tissue within the wound bed including Eschar. [name], [name] ([id number]) Assessment Active Problems ICD-10 Non-pressure chronic ulcer of other part of left lower leg with unspecified severity Venous insufficiency (chronic) (peripheral) Procedures Wound #1 Pre-procedure diagnosis of Wound #1  is a Skin Tear located on the Left,Anterior Lower Leg . There was a Chemical/Enzymatic/Mechanical debridement performed by Montey Hora, RN. With the following instrument(s): tongue blade after achieving pain control using Lidocaine 4% Topical Solution. Agent used was [employer]. A time out was conducted at 13:49, prior to the start of the procedure. There was no bleeding. The procedure was tolerated well with a pain level of 0 throughout and a pain level of 0 following the procedure. Post Debridement Measurements: 7.2cm length x 3cm width x 0.1cm depth; 1.696cm^3 volume. Character of Wound/Ulcer Post Debridement is improved. Post procedure Diagnosis Wound #1: Same as Pre-Procedure Plan Wound Cleansing: Wound #1 Left,Anterior Lower Leg: Dial antibacterial soap, wash wounds, rinse and pat dry prior to dressing wounds May Shower, gently pat wound dry prior to applying new dressing. Primary Wound Dressing: Wound #1 Left,Anterior Lower Leg: Santyl Ointment Secondary Dressing: Wound #1 Left,Anterior Lower Leg: Gauze, ABD and Kerlix/Conform Dressing Change Frequency: Wound #1 Left,Anterior Lower Leg: Change dressing every day. Follow-up Appointments: Return Appointment in 1 week. 1. Left anterior shin wound with scab surface, she is completed the systemic antibiotic treatment, we can use Santyl to the wound and change dressings every other day 2. Return to clinic next week 3. Consider formal vascular studies if there is concern about arterial insufficiency which I do not have any major concerns of at this time 4. Encourage patient to keep legs elevated as much as possible 5. Recommended to patient strongly not to pick at any of the skin lesions such as seborrheic keratoses. [name], [name] ([id number]) Electronic Signature(s) Signed: 12/10/2018 2:01:39 PM By: Tobi Bastos Entered By: Tobi Bastos on 12/10/2018 14:01:39 Carneiro, [name]  ([id number]) -------------------------------------------------------------------------------- ROS/PFSH Details Patient Name: Sandra Pair B. Date of Service: 12/10/2018 1:15 PM Medical Record Number: [id number] Patient Account Number: [id number] Date of Birth/Sex: 04-09-1921 (85 y.o. F) Treating RN: Army Melia Primary Care Provider: [name] Other Clinician: Referring Provider: Kathie Dike Treating Provider/Extender: Beverly Gust in Treatment: 0 Information Obtained From Patient Constitutional Symptoms (General Health) Complaints and Symptoms: Negative for: Fatigue; Fever; Chills; Marked Weight Change Eyes Complaints and Symptoms: Negative for: Dry Eyes; Vision Changes; Glasses / Contacts Medical History: Positive for: Cataracts - both Ear/Nose/Mouth/Throat Complaints and Symptoms: Negative for: Difficult clearing ears; Sinusitis Hematologic/Lymphatic Complaints and Symptoms: Negative for: Bleeding / Clotting Disorders; Human Immunodeficiency Virus Medical History: Negative for: Anemia; Hemophilia; Human Immunodeficiency Virus; Lymphedema; Sickle Cell Disease Respiratory Complaints and Symptoms: Negative for: Chronic or frequent coughs; Shortness of Breath Medical History: Negative for: Aspiration; Asthma; Chronic Obstructive Pulmonary Disease (COPD); Pneumothorax; Sleep Apnea; Tuberculosis Cardiovascular Complaints and Symptoms: Negative for: Chest pain; LE edema Medical History: Positive for: Hypertension Negative for: Angina; Arrhythmia; Congestive Heart Failure; Coronary Artery Disease; Deep Vein Thrombosis; Hypotension; Myocardial Infarction; Peripheral Arterial Disease; Peripheral Venous Disease; Phlebitis; Vasculitis Gastrointestinal [name], [name] ([id number]) Complaints and Symptoms: Negative for: Frequent diarrhea; Nausea; Vomiting Medical History: Negative for: Cirrhosis ; Colitis; Crohnos; Hepatitis A; Hepatitis B; Hepatitis C Past  Medical History Notes: ulcer issues Endocrine Complaints and Symptoms: Negative for: Hepatitis; Thyroid disease; Polydypsia (Excessive Thirst) Medical History: Negative for: Type I Diabetes; Type II Diabetes Genitourinary Complaints and Symptoms: Negative for: Kidney failure/ Dialysis; Incontinence/dribbling Medical History: Negative for:  End Stage Renal Disease Immunological Complaints and Symptoms: Negative for: Hives; Itching Medical History: Negative for: Lupus Erythematosus; Raynaudos; Scleroderma Integumentary (Skin) Complaints and Symptoms: Positive for: Wounds Negative for: Bleeding or bruising tendency; Breakdown; Swelling Medical History: Negative for: History of Burn; History of pressure wounds Musculoskeletal Complaints and Symptoms: Negative for: Muscle Pain; Muscle Weakness Medical History: Negative for: Gout; Rheumatoid Arthritis; Osteoarthritis Neurologic Complaints and Symptoms: Negative for: Numbness/parasthesias; Focal/Weakness Medical History: Negative for: Dementia; Neuropathy; Quadriplegia; Paraplegia; Seizure Disorder Psychiatric CLASSIE, WENG. ([id number]) Complaints and Symptoms: Negative for: Anxiety; Claustrophobia Medical History: Negative for: Anorexia/bulimia; Confinement Anxiety Oncologic Complaints and Symptoms: Review of System Notes: left breast removed d/t breast cancer colon cancer Medical History: Negative for: Received Chemotherapy HBO Extended History Items Eyes: Cataracts Immunizations Pneumococcal Vaccine: Received Pneumococcal Vaccination: Yes Implantable Devices None Family and Social History Cancer: Yes - Father,Mother,Siblings; Diabetes: No; Heart Disease: No; Hereditary Spherocytosis: No; Hypertension: No; Kidney Disease: No; Lung Disease: No; Seizures: No; Stroke: No; Thyroid Problems: No; Tuberculosis: No; Never smoker; Marital Status - Widowed; Alcohol Use: Never; Drug Use: No History; Caffeine Use: Daily -  coffee; Financial Concerns: No; Food, Clothing or Shelter Needs: No; Support System Lacking: No; Transportation Concerns: No Electronic Signature(s) Signed: 12/10/2018 3:04:02 PM By: Army Melia Signed: 12/10/2018 4:11:29 PM By: Tobi Bastos Entered By: Army Melia on 12/10/2018 13:34:17 Luckenbach, [name] ([id number]) -------------------------------------------------------------------------------- SuperBill Details Patient Name: Sandra Pair B. Date of Service: 12/10/2018 Medical Record Number: [id number] Patient Account Number: [id number] Date of Birth/Sex: Nov 16, 1920 (85 y.o. F) Treating RN: Montey Hora Primary Care Provider: [name] Other Clinician: Referring Provider: Kathie Dike Treating Provider/Extender: Beverly Gust in Treatment: 0 Diagnosis Coding ICD-10 Codes Code Description [phone] Non-pressure chronic ulcer of other part of left lower leg with unspecified severity I87.2 Venous insufficiency (chronic) (peripheral) Facility Procedures CPT4 Code: [id number] Description: 27253 - WOUND CARE VISIT-LEV 3 EST PT Modifier: Quantity: 1 CPT4 Code: [id number] Description: 42595 - DEBRIDE W/O ANES NON SELECT Modifier: Quantity: 1 Physician Procedures CPT4 Code Description: [id number] - WC PHYS LEVEL 4 - NEW PT ICD-10 Diagnosis Description 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

## 2018-12-11 NOTE — Progress Notes (Signed)
ENYAH, MOMAN ([id number]) Visit Report for 12/10/2018 Allergy List Details Patient Name: Weeks, [name] Date of Service: 12/10/2018 1:15 PM Medical Record Number: [id number] Patient Account Number: [id number] Date of Birth/Sex: [birth year] (85 y.o. F) Treating RN: Army Melia Primary Care Carletta Feasel: [name] Other Clinician: Referring Story Conti: Kathie Dike Treating Dagmar Adcox/Extender: Beverly Gust in Treatment: 0 Allergies Active Allergies codeine Reaction: nausea, LOC Severity: Severe Allergy Notes Electronic Signature(s) Signed: 12/10/2018 3:04:02 PM By: Army Melia Entered By: Army Melia on 12/10/2018 13:28:53 Weeks, [name] ([id number]) -------------------------------------------------------------------------------- Arrival Information Details Patient Name: [name] B. Date of Service: 12/10/2018 1:15 PM Medical Record Number: [id number] Patient Account Number: [id number] Date of Birth/Sex: [birth year] (85 y.o. F) Treating RN: Army Melia Primary Care Jacquiline Zurcher: [name] Other Clinician: Referring Amazin Pincock: Kathie Dike Treating Hyland Mollenkopf/Extender: Beverly Gust in Treatment: 0 Visit Information Patient Arrived: [name] Arrival Time: 13:17 Accompanied By: daughter Transfer Assistance: None Electronic Signature(s) Signed: 12/10/2018 3:04:02 PM By: Army Melia Entered By: Army Melia on 12/10/2018 13:18:01 [place], [name] ([id number]) -------------------------------------------------------------------------------- Clinic Level of Care Assessment Details Patient Name: [name], [name] Date of Service: 12/10/2018 1:15 PM Medical Record Number: [id number] Patient Account Number: [id number] Date of Birth/Sex: 07/23/1920 (85 y.o. F) Treating RN: Montey Hora Primary Care Ellyana Crigler: [name] Other Clinician: Referring Denise Washburn: Kathie Dike Treating Nas Wafer/Extender: Beverly Gust in Treatment: 0 Clinic Level of  Care Assessment Items TOOL 1 Quantity Score []  - Use when EandM and Procedure is performed on INITIAL visit 0 ASSESSMENTS - Nursing Assessment / Reassessment X - General Physical Exam (combine w/ comprehensive assessment (listed just below) when 1 20 performed on new pt. evals) X- 1 25 Comprehensive Assessment (HX, ROS, Risk Assessments, Wounds Hx, etc.) ASSESSMENTS - Wound and Skin Assessment / Reassessment []  - Dermatologic / Skin Assessment (not related to wound area) 0 ASSESSMENTS - Ostomy and/or Continence Assessment and Care []  - Incontinence Assessment and Management 0 []  - 0 Ostomy Care Assessment and Management (repouching, etc.) PROCESS - Coordination of Care X - Simple Patient / Family Education for ongoing care 1 15 []  - 0 Complex (extensive) Patient / Family Education for ongoing care X- 1 10 Staff obtains Programmer, systems, Records, Test Results / Process Orders []  - 0 Staff telephones HHA, Nursing Homes / Clarify orders / etc []  - 0 Routine Transfer to another Facility (non-emergent condition) []  - 0 Routine Hospital Admission (non-emergent condition) X- 1 15 New Admissions / Biomedical engineer / Ordering NPWT, Apligraf, etc. []  - 0 Emergency Hospital Admission (emergent condition) PROCESS - Special Needs []  - Pediatric / Minor Patient Management 0 []  - 0 Isolation Patient Management []  - 0 Hearing / Language / Visual special needs []  - 0 Assessment of Community assistance (transportation, D/C planning, etc.) []  - 0 Additional assistance / Altered mentation []  - 0 Support Surface(s) Assessment (bed, cushion, seat, etc.) Weeks, [name] INTERVENTIONS - Miscellaneous []  - External ear exam 0 []  - 0 Patient Transfer (multiple staff / Civil Service fast streamer / Similar devices) []  - 0 Simple Staple / Suture removal (25 or less) []  - 0 Complex Staple / Suture removal (26 or more) []  - 0 Hypo/Hyperglycemic Management (do not check if billed separately) X- 1  15 Ankle / Brachial Index (ABI) - do not check if billed separately Has the patient been seen at the hospital within the last three years: Yes Total Score: 100 Level Of Care: New/Established - Level 3 Electronic Signature(s) Signed: 12/10/2018 4:43:58 PM By: Montey Hora Entered  By: Montey Hora on 12/10/2018 13:52:22 [name], [name] ([id number]) -------------------------------------------------------------------------------- Encounter Discharge Information Details Patient Name: Weeks, [name] Date of Service: 12/10/2018 1:15 PM Medical Record Number: [id number] Patient Account Number: [id number] Date of Birth/Sex: 23-Jan-1921 (85 y.o. F) Treating RN: Montey Hora Primary Care Adham Johnson: [name] Other Clinician: Referring Sherilee Smotherman: Kathie Dike Treating Iver Miklas/Extender: Beverly Gust in Treatment: 0 Encounter Discharge Information Items Post Procedure Vitals Discharge Condition: Stable Temperature (F): 98.6 Ambulatory Status: Cane Pulse (bpm): 67 Discharge Destination: Home Respiratory Rate (breaths/min): 18 Transportation: Private Auto Blood Pressure (mmHg): 148/73 Accompanied By: daughter Schedule Follow-up Appointment: Yes Clinical Summary of Care: Electronic Signature(s) Signed: 12/10/2018 4:43:58 PM By: Montey Hora Entered By: Montey Hora on 12/10/2018 13:53:56 [name], [name] ([id number]) -------------------------------------------------------------------------------- Lower Extremity Assessment Details Patient Name: Weeks, [name] Date of Service: 12/10/2018 1:15 PM Medical Record Number: [id number] Patient Account Number: [id number] Date of Birth/Sex: 1920-11-17 (85 y.o. F) Treating RN: Army Melia Primary Care Jamar Casagrande: [name] Other Clinician: Referring Duriel Deery: Kathie Dike Treating Bonny Vanleeuwen/Extender: Beverly Gust in Treatment: 0 Edema Assessment Assessed: [Left: No] [Right: No] Edema: [Left: N] [Right:  o] Calf Left: Right: Point of Measurement: 33 cm From Medial Instep 38 cm cm Ankle Left: Right: Point of Measurement: 10 cm From Medial Instep 23 cm cm Vascular Assessment Pulses: Dorsalis Pedis Palpable: [Left:Yes] Notes unable to obtain ABIs d/t pain with cuff inflation Electronic Signature(s) Signed: 12/10/2018 3:04:02 PM By: Army Melia Entered By: Army Melia on 12/10/2018 13:27:12 [place], [name] ([id number]) -------------------------------------------------------------------------------- Multi Wound Chart Details Patient Name: [name] B. Date of Service: 12/10/2018 1:15 PM Medical Record Number: [id number] Patient Account Number: [id number] Date of Birth/Sex: May 24, 1921 (85 y.o. F) Treating RN: Montey Hora Primary Care Michel Hendon: [name] Other Clinician: Referring Kyndell Zeiser: Kathie Dike Treating Envi Weeks/Extender: Beverly Gust in Treatment: 0 Vital Signs Height(in): 64 Pulse(bpm): 33 Weight(lbs): 148 Blood Pressure(mmHg): 148/73 Body Mass Index(BMI): 25 Temperature(F): 98.6 Respiratory Rate 16 (breaths/min): Photos: [N/A:N/A] Wound Location: Left Lower Leg - Anterior N/A N/A Wounding Event: Other Lesion N/A N/A Primary Etiology: Skin Tear N/A N/A Date Acquired: 11/06/2018 N/A N/A Weeks of Treatment: 0 N/A N/A Wound Status: Open N/A N/A Measurements L x W x D 7.2x3x0.1 N/A N/A (cm) Area (cm) : 16.965 N/A N/A Volume (cm) : 1.696 N/A N/A Classification: Partial Thickness N/A N/A Exudate Amount: None Present N/A N/A Granulation Amount: Small (1-33%) N/A N/A Granulation Quality: Red N/A N/A Necrotic Amount: Large (67-100%) N/A N/A Necrotic Tissue: Eschar N/A N/A Exposed Structures: Fascia: No N/A N/A Fat Layer (Subcutaneous Tissue) Exposed: No Tendon: No Muscle: No Joint: No Bone: No Limited to Skin Breakdown Epithelialization: None N/A N/A Treatment Notes CLOTHILDE, TIPPETTS ([id number]) Electronic Signature(s) Signed:  12/10/2018 4:43:58 PM By: Montey Hora Entered By: Montey Hora on 12/10/2018 13:48:26 Ibs[place], [name] ([id number]) -------------------------------------------------------------------------------- Pain Assessment Details Patient Name: [name] B. Date of Service: 12/10/2018 1:15 PM Medical Record Number: [id number] Patient Account Number: [id number] Date of Birth/Sex: Oct 22, 1920 (85 y.o. F) Treating RN: Army Melia Primary Care Cobin Cadavid: [name] Other Clinician: Referring Ekaterini Capitano: Kathie Dike Treating Aida Lemaire/Extender: Beverly Gust in Treatment: 0 Active Problems Location of Pain Severity and Description of Pain Patient Has Paino No Site Locations Pain Management and Medication Current Pain Management: Electronic Signature(s) Signed: 12/10/2018 3:04:02 PM By: Army Melia Entered By: Army Melia on 12/10/2018 13:19:10 [place], [name] ([id number]) -------------------------------------------------------------------------------- Patient/Caregiver Education Details Patient Name: [name] B. Date of Service: 12/10/2018 1:15 PM Medical Record Number: [id number] Patient  Account Number: [id number] Date of Birth/Gender: 01/12/1921 (85 y.o. F) Treating RN: Montey Hora Primary Care Physician: [name] Other Clinician: Referring Physician: Kathie Dike Treating Physician/Extender: Beverly Gust in Treatment: 0 Education Assessment Education Provided To: Patient and Caregiver Education Topics Provided Wound/Skin Impairment: Handouts: Other: wound care as ordered Methods: Demonstration, Explain/Verbal Responses: State content correctly Electronic Signature(s) Signed: 12/10/2018 4:43:58 PM By: Montey Hora Entered By: Montey Hora on 12/10/2018 13:52:44 Noreen, [name] ([id number]) -------------------------------------------------------------------------------- Wound Assessment Details Patient Name: Weeks, [name] Date of  Service: 12/10/2018 1:15 PM Medical Record Number: [id number] Patient Account Number: [id number] Date of Birth/Sex: [birth year] (85 y.o. F) Treating RN: Army Melia Primary Care Natelie Ostrosky: [name] Other Clinician: Referring Lakeith Careaga: Kathie Dike Treating Enos Muhl/Extender: Beverly Gust in Treatment: 0 Wound Status Wound Number: 1 Primary Etiology: Skin Tear Wound Location: Left Lower Leg - Anterior Wound Status: Open Wounding Event: Other Lesion Date Acquired: 11/06/2018 Weeks Of Treatment: 0 Clustered Wound: No Photos Wound Measurements Length: (cm) 7.2 % Reduction i Width: (cm) 3 % Reduction i Depth: (cm) 0.1 Epithelializa Area: (cm) 16.965 Tunneling: Volume: (cm) 1.696 Undermining: n Area: n Volume: tion: None No No Wound Description Classification: Partial Thickness Foul Odor Aft Exudate Amount: None Present Slough/Fibrin er Cleansing: No o Yes Wound Bed Granulation Amount: Small (1-33%) Exposed Structure Granulation Quality: Red Fascia Exposed: No Necrotic Amount: Large (67-100%) Fat Layer (Subcutaneous Tissue) Exposed: No Necrotic Quality: Eschar Tendon Exposed: No Muscle Exposed: No Joint Exposed: No Bone Exposed: No Limited to Skin Breakdown Treatment Notes Wound #1 (Left, Anterior Lower Leg) Weeks, [name] ([id number]) Notes santyl, gauze, abd and conform Electronic Signature(s) Signed: 12/10/2018 3:04:02 PM By: Army Melia Entered By: Army Melia on 12/10/2018 13:23:04 [place], [name] ([id number]) -------------------------------------------------------------------------------- Vitals Details Patient Name: [name] B. Date of Service: 12/10/2018 1:15 PM Medical Record Number: [id number] Patient Account Number: [id number] Date of Birth/Sex: January 20, 1921 (85 y.o. F) Treating RN: Army Melia Primary Care Aneesa Romey: [name] Other Clinician: Referring Ahmad Vanwey: Kathie Dike Treating Esli Clements/Extender: Beverly Gust in Treatment: 0 Vital Signs Time Taken: 13:19 Temperature (F): 98.6 Height (in): 64 Pulse (bpm): 67 Source: Stated Respiratory Rate (breaths/min): 16 Weight (lbs): 148 Blood Pressure (mmHg): 148/73 Source: Stated Reference Range: 80 - 120 mg / dl Body Mass Index (BMI): 25.4 Electronic Signature(s) Signed: 12/10/2018 3:04:02 PM By: Army Melia Entered By: Army Melia on 12/10/2018 13:19:54

## 2018-12-17 ENCOUNTER — Ambulatory Visit: Payer: Medicare Other | Admitting: Physician Assistant

## 2018-12-19 ENCOUNTER — Other Ambulatory Visit: Payer: Self-pay

## 2018-12-19 ENCOUNTER — Encounter: Payer: Medicare Other | Admitting: Physician Assistant

## 2018-12-19 DIAGNOSIS — I872 Venous insufficiency (chronic) (peripheral): Secondary | ICD-10-CM | POA: Diagnosis not present

## 2018-12-23 ENCOUNTER — Encounter: Payer: Self-pay | Admitting: General Surgery

## 2018-12-24 NOTE — Progress Notes (Signed)
VANESHA, ATHENS ([id number]) Visit Report for 12/19/2018 Chief Complaint Document Details Patient Name: Prothero, Novaleigh B. Date of Service: 12/19/2018 1:00 PM Medical Record Number: [id number] Patient Account Number: [id number] Date of Birth/Sex: November 01, 1920 (85 y.o. F) Treating RN: Army Melia Primary Care Provider: [name] Other Clinician: Referring Provider: [name] Treating Provider/Extender: Melburn Hake, Antario Yasuda Weeks in Treatment: 1 Information Obtained from: Patient Chief Complaint Left leg wound, referred by her Surgeon Electronic Signature(s) Signed: 12/21/2018 12:16:19 AM By: Worthy Keeler PA-C Entered By: Worthy Keeler on 12/19/2018 13:21:49 Wery, Jackie Plum ([id number]) -------------------------------------------------------------------------------- Debridement Details Patient Name: [name] Date of Service: 12/19/2018 1:00 PM Medical Record Number: [id number] Patient Account Number: [id number] Date of Birth/Sex: 1920/06/17 (85 y.o. F) Treating RN: Cornell Barman Primary Care Provider: [name] Other Clinician: Referring Provider: [name] Treating Provider/Extender: Melburn Hake, Joyann Spidle Weeks in Treatment: 1 Debridement Performed for Wound #1 Left,Anterior Lower Leg Assessment: Performed By: Physician STONE III, Gertude Benito E., PA-C Debridement Type: Chemical/Enzymatic/Mechanical Agent Used: Santyl Level of Consciousness (Pre- Awake and Alert procedure): Pre-procedure Verification/Time Yes - 13:25 Out Taken: Start Time: 13:25 Pain Control: Lidocaine Instrument: Other : tongue blade Bleeding: None End Time: 13:30 Response to Treatment: Procedure was tolerated well Level of Consciousness Awake and Alert (Post-procedure): Post Debridement Measurements of Total Wound Length: (cm) 7.8 Width: (cm) 2.5 Depth: (cm) 0.1 Volume: (cm) 1.532 Character of Wound/Ulcer Post Debridement: Requires Further Debridement Post Procedure Diagnosis Same as  Pre-procedure Electronic Signature(s) Signed: 12/21/2018 12:16:19 AM By: Worthy Keeler PA-C Signed: 12/24/2018 11:55:38 AM By: Gretta Cool, BSN, RN, CWS, Kim RN, BSN Entered By: Gretta Cool, BSN, RN, CWS, Kim on 12/19/2018 13:30:47 Benn, Jackie Plum ([id number]) -------------------------------------------------------------------------------- HPI Details Patient Name: [name] Date of Service: 12/19/2018 1:00 PM Medical Record Number: [id number] Patient Account Number: [id number] Date of Birth/Sex: 1921-02-14 (85 y.o. F) Treating RN: Army Melia Primary Care Provider: [name] Other Clinician: Referring Provider: [name] Treating Provider/Extender: Melburn Hake, Alayza Pieper Weeks in Treatment: 1 History of Present Illness HPI Description: 85 year old female who developed a left anterior leg skin tear more than a month ago with a flap on it, this first started out when family were helping her put on a pair of pants and the edge of the pant leg scraped and pulled off in the area of skin on the left anterior shin. She was seen at an urgent care for the skin tear where the used a scissors to cut the loose lying skin flap on top of the wound. To give her course of antibiotics which I believe was for 10 days that she completed almost a month ago. Since then the wound had been scabbed but she experienced a lot of pain in this area family has been using Vaseline on this area after using Neosporin for some time and she is referred to the clinic for further care of the wound with scab on it. Patient denies any fevers chills shakes. She is ambulatory and has family assist her when needed. Patient is at her baseline in terms of her activity level and functional status other than experiencing some pain in the left leg around the scab. ABIs were not obtained today on the left on account of significant pain associated with the scabbing wound Patient has history of breast cancer status post mastectomy in  remission, colon cancer status post colectomy in remission, iron deficiency for which she gets iron infusions, hypertension, she is not on any blood thinners 12/19/18 on evaluation today patient  actually appears to be redoing okay in regard to the skin tear on the left anterior lower extremity. With that being said this is still showing signs of a significant amount of necrotic tissue on the surface of the wound unfortunately. She's been "trying to let this scab" which obviously is not the right thing at this point. I do think that cental in keeping this area covered is gonna be the ideal way to get this to heal most appropriately. Obviously I explained to the patient as well as her son who was present during the evaluation today that we really need to try and get the necrotic tissue off of the surface of the wound in order to let this heal appropriately. Fortunately there's no signs of active infection. Electronic Signature(s) Signed: 12/21/2018 12:16:19 AM By: Worthy Keeler PA-C Entered By: Worthy Keeler on 12/21/2018 00:07:37 Scaffidi, Jackie Plum ([id number]) -------------------------------------------------------------------------------- Physical Exam Details Patient Name: Fogal, Colbi B. Date of Service: 12/19/2018 1:00 PM Medical Record Number: [id number] Patient Account Number: [id number] Date of Birth/Sex: 1920/10/04 (85 y.o. F) Treating RN: Army Melia Primary Care Provider: [name] Other Clinician: Referring Provider: [name] Treating Provider/Extender: Melburn Hake, Hadley Detloff Weeks in Treatment: 1 Constitutional Well-nourished and well-hydrated in no acute distress. Respiratory normal breathing without difficulty. clear to auscultation bilaterally. Cardiovascular regular rate and rhythm with normal S1, S2. Psychiatric this patient is able to make decisions and demonstrates good insight into disease process. Alert and Oriented x 3. pleasant and cooperative. Notes On  inspection today patient's wound bed actually showed signs of significant necrotic tissue on the surface of the wound which is gonna require sharp debridement at some point if were not able to loosen this up. Again she was having discomfort so that was not attempted today I do believe that we need to see about loosening up some of the tissue in order to be able to be more easily debrided away hopefully next week. With that being said there's no signs of active infection systemically or locally at this point. Electronic Signature(s) Signed: 12/21/2018 12:16:19 AM By: Worthy Keeler PA-C Entered By: Worthy Keeler on 12/21/2018 00:09:13 Moskowitz, Jackie Plum ([id number]) -------------------------------------------------------------------------------- Physician Orders Details Patient Name: [name] Date of Service: 12/19/2018 1:00 PM Medical Record Number: [id number] Patient Account Number: [id number] Date of Birth/Sex: [birth year] (85 y.o. F) Treating RN: Cornell Barman Primary Care Provider: [name] Other Clinician: Referring Provider: [name] Treating Provider/Extender: Melburn Hake, Kaelani Kendrick Weeks in Treatment: 1 Verbal / Phone Orders: No Diagnosis Coding ICD-10 Coding Code Description I87.2 Venous insufficiency (chronic) (peripheral) L97.822 Non-pressure chronic ulcer of other part of left lower leg with fat layer exposed Wound Cleansing Wound #1 Left,Anterior Lower Leg o Dial antibacterial soap, wash wounds, rinse and pat dry prior to dressing wounds o May Shower, gently pat wound dry prior to applying new dressing. Primary Wound Dressing Wound #1 Left,Anterior Lower Leg o Santyl Ointment Secondary Dressing Wound #1 Left,Anterior Lower Leg o Non-adherent pad - Secured with conform an tape. Dressing Change Frequency Wound #1 Left,Anterior Lower Leg o Change dressing every day. Follow-up Appointments o Return Appointment in 1 week. Edema Control Wound #1  Left,Anterior Lower Leg o Other: - Tubigrip G Electronic Signature(s) Signed: 12/21/2018 12:16:19 AM By: Worthy Keeler PA-C Signed: 12/24/2018 11:55:38 AM By: Gretta Cool, BSN, RN, CWS, Kim RN, BSN Entered By: Gretta Cool, BSN, RN, CWS, Kim on 12/19/2018 13:33:46 Lucking, Jackie Plum ([id number]) -------------------------------------------------------------------------------- Problem List Details Patient Name: Vantrease, Lakeitha B. Date  of Service: 12/19/2018 1:00 PM Medical Record Number: [id number] Patient Account Number: [id number] Date of Birth/Sex: [birth year] (85 y.o. F) Treating RN: Army Melia Primary Care Provider: [name] Other Clinician: Referring Provider: [name] Treating Provider/Extender: Melburn Hake, Ariani Seier Weeks in Treatment: 1 Active Problems ICD-10 Evaluated Encounter Code Description Active Date Today Diagnosis I87.2 Venous insufficiency (chronic) (peripheral) 12/10/2018 No Yes L97.822 Non-pressure chronic ulcer of other part of left lower leg with 12/10/2018 No Yes fat layer exposed Inactive Problems Resolved Problems Electronic Signature(s) Signed: 12/21/2018 12:16:19 AM By: Worthy Keeler PA-C Entered By: Worthy Keeler on 12/19/2018 13:21:38 Callanan, Jackie Plum ([id number]) -------------------------------------------------------------------------------- Progress Note Details Patient Name: Orengo, [name] Date of Service: 12/19/2018 1:00 PM Medical Record Number: [id number] Patient Account Number: [id number] Date of Birth/Sex: 09-15-1920 (85 y.o. F) Treating RN: Army Melia Primary Care Provider: [name] Other Clinician: Referring Provider: [name] Treating Provider/Extender: Melburn Hake, Elley Harp Weeks in Treatment: 1 Subjective Chief Complaint Information obtained from Patient Left leg wound, referred by her Surgeon History of Present Illness (HPI) 85 year old female who developed a left anterior leg skin tear more than a month ago with a flap on it, this  first started out when family were helping her put on a pair of pants and the edge of the pant leg scraped and pulled off in the area of skin on the left anterior shin. She was seen at an urgent care for the skin tear where the used a scissors to cut the loose lying skin flap on top of the wound. To give her course of antibiotics which I believe was for 10 days that she completed almost a month ago. Since then the wound had been scabbed but she experienced a lot of pain in this area family has been using Vaseline on this area after using Neosporin for some time and she is referred to the clinic for further care of the wound with scab on it. Patient denies any fevers chills shakes. She is ambulatory and has family assist her when needed. Patient is at her baseline in terms of her activity level and functional status other than experiencing some pain in the left leg around the scab. ABIs were not obtained today on the left on account of significant pain associated with the scabbing wound Patient has history of breast cancer status post mastectomy in remission, colon cancer status post colectomy in remission, iron deficiency for which she gets iron infusions, hypertension, she is not on any blood thinners 12/19/18 on evaluation today patient actually appears to be redoing okay in regard to the skin tear on the left anterior lower extremity. With that being said this is still showing signs of a significant amount of necrotic tissue on the surface of the wound unfortunately. She's been "trying to let this scab" which obviously is not the right thing at this point. I do think that cental in keeping this area covered is gonna be the ideal way to get this to heal most appropriately. Obviously I explained to the patient as well as her son who was present during the evaluation today that we really need to try and get the necrotic tissue off of the surface of the wound in order to let this heal appropriately.  Fortunately there's no signs of active infection. Patient History Information obtained from Patient. Family History Cancer - Father,Mother,Siblings, No family history of Diabetes, Heart Disease, Hereditary Spherocytosis, Hypertension, Kidney Disease, Lung Disease, Seizures, Stroke, Thyroid Problems, Tuberculosis. Social History Never smoker, Marital  Status - Widowed, Alcohol Use - Never, Drug Use - No History, Caffeine Use - Daily - coffee. Medical History Eyes Patient has history of Cataracts - both Hematologic/Lymphatic Denies history of Anemia, Hemophilia, Human Immunodeficiency Virus, Lymphedema, Sickle Cell Disease Respiratory Denies history of Aspiration, Asthma, Chronic Obstructive Pulmonary Disease (COPD), Pneumothorax, Sleep Apnea, Tuberculosis Cardiovascular Winker, Daena B. ([id number]) Patient has history of Hypertension Denies history of Angina, Arrhythmia, Congestive Heart Failure, Coronary Artery Disease, Deep Vein Thrombosis, Hypotension, Myocardial Infarction, Peripheral Arterial Disease, Peripheral Venous Disease, Phlebitis, Vasculitis Gastrointestinal Denies history of Cirrhosis , Colitis, Crohn s, Hepatitis A, Hepatitis B, Hepatitis C Endocrine Denies history of Type I Diabetes, Type II Diabetes Genitourinary Denies history of End Stage Renal Disease Immunological Denies history of Lupus Erythematosus, Raynaud s, Scleroderma Integumentary (Skin) Denies history of History of Burn, History of pressure wounds Musculoskeletal Denies history of Gout, Rheumatoid Arthritis, Osteoarthritis Neurologic Denies history of Dementia, Neuropathy, Quadriplegia, Paraplegia, Seizure Disorder Oncologic Denies history of Received Chemotherapy Psychiatric Denies history of Anorexia/bulimia, Confinement Anxiety Medical And Surgical History Notes Gastrointestinal ulcer issues Review of Systems (ROS) Constitutional Symptoms (General Health) Denies complaints or symptoms of  Fatigue, Fever, Chills, Marked Weight Change. Respiratory Denies complaints or symptoms of Chronic or frequent coughs, Shortness of Breath. Cardiovascular Complains or has symptoms of LE edema. Denies complaints or symptoms of Chest pain. Psychiatric Denies complaints or symptoms of Anxiety, Claustrophobia. Objective Constitutional Well-nourished and well-hydrated in no acute distress. Vitals Time Taken: 1:13 PM, Height: 64 in, Weight: 148 lbs, BMI: 25.4, Temperature: 97.8 F, Pulse: 67 bpm, Respiratory Rate: 16 breaths/min, Blood Pressure: 142/68 mmHg. Respiratory normal breathing without difficulty. clear to auscultation bilaterally. Cardiovascular regular rate and rhythm with normal S1, S2. Bath, Shirley B. ([id number]) Psychiatric this patient is able to make decisions and demonstrates good insight into disease process. Alert and Oriented x 3. pleasant and cooperative. General Notes: On inspection today patient's wound bed actually showed signs of significant necrotic tissue on the surface of the wound which is gonna require sharp debridement at some point if were not able to loosen this up. Again she was having discomfort so that was not attempted today I do believe that we need to see about loosening up some of the tissue in order to be able to be more easily debrided away hopefully next week. With that being said there's no signs of active infection systemically or locally at this point. Integumentary (Hair, Skin) Wound #1 status is Open. Original cause of wound was Other Lesion. The wound is located on the Left,Anterior Lower Leg. The wound measures 7.8cm length x 2.6cm width x 0.1cm depth; 15.928cm^2 area and 1.593cm^3 volume. There is Fat Layer (Subcutaneous Tissue) Exposed exposed. There is no tunneling or undermining noted. There is a small amount of serous drainage noted. The wound margin is indistinct and nonvisible. There is small (1-33%) red granulation within the wound  bed. There is a large (67-100%) amount of necrotic tissue within the wound bed including Eschar. Assessment Active Problems ICD-10 Venous insufficiency (chronic) (peripheral) Non-pressure chronic ulcer of other part of left lower leg with fat layer exposed Procedures Wound #1 Pre-procedure diagnosis of Wound #1 is a Skin Tear located on the Left,Anterior Lower Leg . There was a Chemical/Enzymatic/Mechanical debridement performed by STONE III, Kayron Kalmar E., PA-C. With the following instrument(s): tongue blade after achieving pain control using Lidocaine. Agent used was [employer]. A time out was conducted at 13:25, prior to the start of the procedure. There was no bleeding. The  procedure was tolerated well. Post Debridement Measurements: 7.8cm length x 2.5cm width x 0.1cm depth; 1.532cm^3 volume. Character of Wound/Ulcer Post Debridement requires further debridement. Post procedure Diagnosis Wound #1: Same as Pre-Procedure Plan Wound Cleansing: Wound #1 Left,Anterior Lower Leg: Dial antibacterial soap, wash wounds, rinse and pat dry prior to dressing wounds May Shower, gently pat wound dry prior to applying new dressing. Primary Wound Dressing: BAYLYNN, SHIFFLETT B. ([id number]) Wound #1 Left,Anterior Lower Leg: Santyl Ointment Secondary Dressing: Wound #1 Left,Anterior Lower Leg: Non-adherent pad - Secured with conform an tape. Dressing Change Frequency: Wound #1 Left,Anterior Lower Leg: Change dressing every day. Follow-up Appointments: Return Appointment in 1 week. Edema Control: Wound #1 Left,Anterior Lower Leg: Other: - Tubigrip G My suggestion at this point is gonna be that we go ahead and continue with the recommendation for cental I had a long discussion with the patient and her son regarding the fact that she needs to keep this covered at all times order to help it to heal most appropriately. If anything changes or worsens in the meantime they will contact the office and let me  know. Please see above for specific wound care orders. We will see patient for re-evaluation in 1 week(s) here in the clinic. If anything worsens or changes patient will contact our office for additional recommendations. Electronic Signature(s) Signed: 12/21/2018 12:16:19 AM By: Worthy Keeler PA-C Entered By: Worthy Keeler on 12/21/2018 00:09:44 Badon, Jackie Plum ([id number]) -------------------------------------------------------------------------------- ROS/PFSH Details Patient Name: [name] Date of Service: 12/19/2018 1:00 PM Medical Record Number: [id number] Patient Account Number: [id number] Date of Birth/Sex: 10/16/1920 (85 y.o. F) Treating RN: Army Melia Primary Care Provider: [name] Other Clinician: Referring Provider: [name] Treating Provider/Extender: Melburn Hake, Romello Hoehn Weeks in Treatment: 1 Information Obtained From Patient Constitutional Symptoms (General Health) Complaints and Symptoms: Negative for: Fatigue; Fever; Chills; Marked Weight Change Respiratory Complaints and Symptoms: Negative for: Chronic or frequent coughs; Shortness of Breath Medical History: Negative for: Aspiration; Asthma; Chronic Obstructive Pulmonary Disease (COPD); Pneumothorax; Sleep Apnea; Tuberculosis Cardiovascular Complaints and Symptoms: Positive for: LE edema Negative for: Chest pain Medical History: Positive for: Hypertension Negative for: Angina; Arrhythmia; Congestive Heart Failure; Coronary Artery Disease; Deep Vein Thrombosis; Hypotension; Myocardial Infarction; Peripheral Arterial Disease; Peripheral Venous Disease; Phlebitis; Vasculitis Psychiatric Complaints and Symptoms: Negative for: Anxiety; Claustrophobia Medical History: Negative for: Anorexia/bulimia; Confinement Anxiety Eyes Medical History: Positive for: Cataracts - both Hematologic/Lymphatic Medical History: Negative for: Anemia; Hemophilia; Human Immunodeficiency Virus; Lymphedema; Sickle  Cell Disease Gastrointestinal Medical History: Negative for: Cirrhosis ; Colitis; Crohnos; Hepatitis A; Hepatitis B; Hepatitis C Past Medical History Notes: KALAYNA, NOY ([id number]) ulcer issues Endocrine Medical History: Negative for: Type I Diabetes; Type II Diabetes Genitourinary Medical History: Negative for: End Stage Renal Disease Immunological Medical History: Negative for: Lupus Erythematosus; Raynaudos; Scleroderma Integumentary (Skin) Medical History: Negative for: History of Burn; History of pressure wounds Musculoskeletal Medical History: Negative for: Gout; Rheumatoid Arthritis; Osteoarthritis Neurologic Medical History: Negative for: Dementia; Neuropathy; Quadriplegia; Paraplegia; Seizure Disorder Oncologic Medical History: Negative for: Received Chemotherapy HBO Extended History Items Eyes: Cataracts Immunizations Pneumococcal Vaccine: Received Pneumococcal Vaccination: Yes Implantable Devices None Family and Social History Cancer: Yes - Father,Mother,Siblings; Diabetes: No; Heart Disease: No; Hereditary Spherocytosis: No; Hypertension: No; Kidney Disease: No; Lung Disease: No; Seizures: No; Stroke: No; Thyroid Problems: No; Tuberculosis: No; Never smoker; Marital Status - Widowed; Alcohol Use: Never; Drug Use: No History; Caffeine Use: Daily - coffee; Financial Concerns: No; Food, Clothing or Shelter Needs: No; Support  System Lacking: No; Transportation Concerns: No Physician Affirmation I have reviewed and agree with the above information. Electronic Signature(s) MARKALA, SITTS ([id number]) Signed: 12/21/2018 12:16:19 AM By: Worthy Keeler PA-C Signed: 12/23/2018 11:41:51 AM By: Army Melia Entered By: Worthy Keeler on 12/21/2018 00:08:50 Prude, Jackie Plum ([id number]) -------------------------------------------------------------------------------- SuperBill Details Patient Name: [name] Date of Service: 12/19/2018 Medical Record  Number: [id number] Patient Account Number: [id number] Date of Birth/Sex: 1921/01/25 (85 y.o. F) Treating RN: Army Melia Primary Care Provider: [name] Other Clinician: Referring Provider: [name] Treating Provider/Extender: Melburn Hake, Cherilynn Schomburg Weeks in Treatment: 1 Diagnosis Coding ICD-10 Codes Code Description I87.2 Venous insufficiency (chronic) (peripheral) L97.822 Non-pressure chronic ulcer of other part of left lower leg with fat layer exposed Facility Procedures CPT4 Code: [id number] Description: 34035 - DEBRIDE W/O ANES NON SELECT Modifier: Quantity: 1 Physician Procedures CPT4 Code Description: [id number] - WC PHYS LEVEL 4 - EST PT ICD-10 Diagnosis Description I87.2 Venous insufficiency (chronic) (peripheral) L97.822 Non-pressure chronic ulcer of other part of left lower leg wit Modifier: h fat layer expos Quantity: 1 ed Electronic Signature(s) Signed: 12/21/2018 12:16:19 AM By: Worthy Keeler PA-C Entered By: Worthy Keeler on 12/19/2018 17:08:42

## 2018-12-24 NOTE — Progress Notes (Signed)
SHA, BURLING ([id number]) Visit Report for 12/19/2018 Arrival Information Details Patient Name: LANIQUA, TORRENS. Date of Service: 12/19/2018 1:00 PM Medical Record Number: [id number] Patient Account Number: [id number] Date of Birth/Sex: [birth year] (85 y.o. F) Treating RN: Montey Hora Primary Care Tristy Udovich: [name] Other Clinician: Referring Pailynn Vahey: [name] Treating Meghin Thivierge/Extender: Melburn Hake, HOYT Weeks in Treatment: 1 Visit Information History Since Last Visit Added or deleted any medications: No Patient Arrived: [name] Any new allergies or adverse reactions: No Arrival Time: 13:11 Had a fall or experienced change in No Accompanied By: son activities of daily living that may affect Transfer Assistance: None risk of falls: Patient Identification Verified: Yes Signs or symptoms of abuse/neglect since last visito No Secondary Verification Process Completed: Yes Hospitalized since last visit: No Implantable device outside of the clinic excluding No cellular tissue based products placed in the center since last visit: Has Dressing in Place as Prescribed: No Pain Present Now: No Electronic Signature(s) Signed: 12/19/2018 4:10:49 PM By: Montey Hora Entered By: Montey Hora on 12/19/2018 13:11:37 Maeder, Jackie Plum ([id number]) -------------------------------------------------------------------------------- Encounter Discharge Information Details Patient Name: [name] Date of Service: 12/19/2018 1:00 PM Medical Record Number: [id number] Patient Account Number: [id number] Date of Birth/Sex: 01-17-1921 (85 y.o. F) Treating RN: Cornell Barman Primary Care Jarrell Armond: [name] Other Clinician: Referring Fares Ramthun: [name] Treating Avonell Lenig/Extender: Melburn Hake, HOYT Weeks in Treatment: 1 Encounter Discharge Information Items Post Procedure Vitals Discharge Condition: Stable Temperature (F): 97.7 Ambulatory Status: Ambulatory Pulse (bpm):  67 Discharge Destination: Home Respiratory Rate (breaths/min): 16 Transportation: Private Auto Blood Pressure (mmHg): 142/68 Accompanied By: son Schedule Follow-up Appointment: Yes Clinical Summary of Care: Electronic Signature(s) Signed: 12/24/2018 11:55:38 AM By: Gretta Cool, BSN, RN, CWS, Kim RN, BSN Entered By: Gretta Cool, BSN, RN, CWS, Kim on 12/19/2018 13:35:26 Gaeta, Jackie Plum ([id number]) -------------------------------------------------------------------------------- Lower Extremity Assessment Details Patient Name: Kinkade, Kevin B. Date of Service: 12/19/2018 1:00 PM Medical Record Number: [id number] Patient Account Number: [id number] Date of Birth/Sex: 18-Jul-1920 (85 y.o. F) Treating RN: Montey Hora Primary Care Tarus Briski: [name] Other Clinician: Referring Kindred Heying: [name] Treating Gertrue Willette/Extender: STONE III, HOYT Weeks in Treatment: 1 Edema Assessment Assessed: [Left: No] [Right: No] Edema: [Left: N] [Right: o] Vascular Assessment Pulses: Dorsalis Pedis Palpable: [Left:Yes] Electronic Signature(s) Signed: 12/19/2018 4:10:49 PM By: Montey Hora Entered By: Montey Hora on 12/19/2018 13:18:18 Rudnick, Jackie Plum ([id number]) -------------------------------------------------------------------------------- Multi Wound Chart Details Patient Name: Rivere, [name] Date of Service: 12/19/2018 1:00 PM Medical Record Number: [id number] Patient Account Number: [id number] Date of Birth/Sex: [birth year] (85 y.o. F) Treating RN: Cornell Barman Primary Care Husein Guedes: [name] Other Clinician: Referring Tawnie Ehresman: [name] Treating Tait Balistreri/Extender: Melburn Hake, HOYT Weeks in Treatment: 1 Vital Signs Height(in): 64 Pulse(bpm): 45 Weight(lbs): 148 Blood Pressure(mmHg): 142/68 Body Mass Index(BMI): 25 Temperature(F): 97.8 Respiratory Rate 16 (breaths/min): Photos: [N/A:N/A] Wound Location: Left Lower Leg - Anterior N/A N/A Wounding Event: Other Lesion N/A  N/A Primary Etiology: Skin Tear N/A N/A Comorbid History: Cataracts, Hypertension N/A N/A Date Acquired: 11/06/2018 N/A N/A Weeks of Treatment: 1 N/A N/A Wound Status: Open N/A N/A Measurements L x W x D 7.8x2.6x0.1 N/A N/A (cm) Area (cm) : 15.928 N/A N/A Volume (cm) : 1.593 N/A N/A % Reduction in Area: 6.10% N/A N/A % Reduction in Volume: 6.10% N/A N/A Classification: Partial Thickness N/A N/A Exudate Amount: Small N/A N/A Exudate Type: Serous N/A N/A Exudate Color: amber N/A N/A Wound Margin: Indistinct, nonvisible N/A N/A Granulation Amount: Small (1-33%) N/A N/A Granulation Quality: Red N/A N/A  Necrotic Amount: Large (67-100%) N/A N/A Necrotic Tissue: Eschar N/A N/A Exposed Structures: Fat Layer (Subcutaneous N/A N/A Tissue) Exposed: Yes Fascia: No Tendon: No Muscle: No Joint: No Bone: No Enis, Disa B. ([id number]) Epithelialization: None N/A N/A Treatment Notes Electronic Signature(s) Signed: 12/24/2018 11:55:38 AM By: Gretta Cool, BSN, RN, CWS, Kim RN, BSN Entered By: Gretta Cool, BSN, RN, CWS, Kim on 12/19/2018 13:28:09 Rahm, Jackie Plum ([id number]) -------------------------------------------------------------------------------- [place] Details Patient Name: [name] Date of Service: 12/19/2018 1:00 PM Medical Record Number: [id number] Patient Account Number: [id number] Date of Birth/Sex: 1920/08/13 (85 y.o. F) Treating RN: Cornell Barman Primary Care Demtrius Rounds: [name] Other Clinician: Referring Mao Lockner: [name] Treating Ahmeer Tuman/Extender: Melburn Hake, HOYT Weeks in Treatment: 1 Active Inactive Necrotic Tissue Nursing Diagnoses: Impaired tissue integrity related to necrotic/devitalized tissue Goals: Necrotic/devitalized tissue will be minimized in the wound bed Date Initiated: 12/19/2018 Target Resolution Date: 12/25/2018 Goal Status: Active Interventions: Assess patient pain level pre-, during and post procedure and prior to  discharge Treatment Activities: Enzymatic debridement : 12/19/2018 Notes: Orientation to the Wound Care Program Nursing Diagnoses: Knowledge deficit related to the wound healing center program Goals: Patient/caregiver will verbalize understanding of the [place] Date Initiated: 12/19/2018 Target Resolution Date: 12/25/2018 Goal Status: Active Interventions: Provide education on orientation to the wound center Notes: Pain, Acute or Chronic Nursing Diagnoses: Potential alteration in comfort, pain Goals: Patient will verbalize adequate pain control and receive pain control interventions during procedures as needed Date Initiated: 12/19/2018 Target Resolution Date: 12/25/2018 Goal Status: Active ZOEIE, RITTER ([id number]) Interventions: Provide education on pain management Notes: Wound/Skin Impairment Nursing Diagnoses: Impaired tissue integrity Goals: Patient/caregiver will verbalize understanding of skin care regimen Date Initiated: 12/19/2018 Target Resolution Date: 12/25/2018 Goal Status: Active Interventions: Assess ulceration(s) every visit Treatment Activities: Skin care regimen initiated : 12/19/2018 Notes: Electronic Signature(s) Signed: 12/24/2018 11:55:38 AM By: Gretta Cool, BSN, RN, CWS, Kim RN, BSN Entered By: Gretta Cool, BSN, RN, CWS, Kim on 12/19/2018 13:27:57 Fullwood, Jackie Plum ([id number]) -------------------------------------------------------------------------------- Pain Assessment Details Patient Name: [name] Date of Service: 12/19/2018 1:00 PM Medical Record Number: [id number] Patient Account Number: [id number] Date of Birth/Sex: 1921-01-08 (85 y.o. F) Treating RN: Montey Hora Primary Care Shayaan Parke: [name] Other Clinician: Referring Juaquin Ludington: [name] Treating Khrystian Schauf/Extender: Melburn Hake, HOYT Weeks in Treatment: 1 Active Problems Location of Pain Severity and Description of Pain Patient Has Paino No Site  Locations Pain Management and Medication Current Pain Management: Electronic Signature(s) Signed: 12/19/2018 4:10:49 PM By: Montey Hora Entered By: Montey Hora on 12/19/2018 13:13:38 Mccoin, Jackie Plum ([id number]) -------------------------------------------------------------------------------- Patient/Caregiver Education Details Patient Name: [name] Date of Service: 12/19/2018 1:00 PM Medical Record Number: [id number] Patient Account Number: [id number] Date of Birth/Gender: 05/22/1921 (85 y.o. F) Treating RN: Cornell Barman Primary Care Physician: [name] Other Clinician: Referring Physician: [name] Treating Physician/Extender: Melburn Hake, HOYT Weeks in Treatment: 1 Education Assessment Education Provided To: Patient Education Topics Provided Wound/Skin Impairment: Handouts: Caring for Your Ulcer Methods: Demonstration, Explain/Verbal Responses: State content correctly Electronic Signature(s) Signed: 12/24/2018 11:55:38 AM By: Gretta Cool, BSN, RN, CWS, Kim RN, BSN Entered By: Gretta Cool, BSN, RN, CWS, Kim on 12/19/2018 13:34:21 Marek, Jackie Plum ([id number]) -------------------------------------------------------------------------------- Wound Assessment Details Patient Name: Rideout, Satomi B. Date of Service: 12/19/2018 1:00 PM Medical Record Number: [id number] Patient Account Number: [id number] Date of Birth/Sex: [birth year] (85 y.o. F) Treating RN: Montey Hora Primary Care Amandajo Gonder: [name] Other Clinician: Referring Trice Aspinall: [name] Treating Idell Hissong/Extender: Melburn Hake, HOYT Weeks in Treatment: 1  Wound Status Wound Number: 1 Primary Etiology: Skin Tear Wound Location: Left Lower Leg - Anterior Wound Status: Open Wounding Event: Other Lesion Comorbid History: Cataracts, Hypertension Date Acquired: 11/06/2018 Weeks Of Treatment: 1 Clustered Wound: No Photos Wound Measurements Length: (cm) 7.8 % Reduction Width: (cm) 2.6 % Reduction Depth:  (cm) 0.1 Epithelializ Area: (cm) 15.928 Tunneling: Volume: (cm) 1.593 Undermining in Area: 6.1% in Volume: 6.1% ation: None No : No Wound Description Classification: Partial Thickness Foul Odor A Wound Margin: Indistinct, nonvisible Slough/Fibr Exudate Amount: Small Exudate Type: Serous Exudate Color: amber fter Cleansing: No ino Yes Wound Bed Granulation Amount: Small (1-33%) Exposed Structure Granulation Quality: Red Fascia Exposed: No Necrotic Amount: Large (67-100%) Fat Layer (Subcutaneous Tissue) Exposed: Yes Necrotic Quality: Eschar Tendon Exposed: No Muscle Exposed: No Joint Exposed: No Bone Exposed: No Treatment Notes Vinciguerra, Rosabelle B. ([id number]) Wound #1 (Left, Anterior Lower Leg) Notes santyl, telfa and conform Electronic Signature(s) Signed: 12/19/2018 4:10:49 PM By: Montey Hora Entered By: Montey Hora on 12/19/2018 13:17:03 Stell, Jackie Plum ([id number]) -------------------------------------------------------------------------------- Vitals Details Patient Name: [name] Date of Service: 12/19/2018 1:00 PM Medical Record Number: [id number] Patient Account Number: [id number] Date of Birth/Sex: [birth year] (85 y.o. F) Treating RN: Montey Hora Primary Care Brittnae Aschenbrenner: [name] Other Clinician: Referring Myliyah Rebuck: [name] Treating Nyari Olsson/Extender: Melburn Hake, HOYT Weeks in Treatment: 1 Vital Signs Time Taken: 13:13 Temperature (F): 97.8 Height (in): 64 Pulse (bpm): 67 Weight (lbs): 148 Respiratory Rate (breaths/min): 16 Body Mass Index (BMI): 25.4 Blood Pressure (mmHg): 142/68 Reference Range: 80 - 120 mg / dl Electronic Signature(s) Signed: 12/19/2018 4:10:49 PM By: Montey Hora Entered By: Montey Hora on 12/19/2018 13:15:59

## 2018-12-26 ENCOUNTER — Other Ambulatory Visit: Payer: Self-pay

## 2018-12-26 ENCOUNTER — Encounter: Payer: Medicare Other | Admitting: Physician Assistant

## 2018-12-26 DIAGNOSIS — I872 Venous insufficiency (chronic) (peripheral): Secondary | ICD-10-CM | POA: Diagnosis not present

## 2018-12-26 NOTE — Progress Notes (Addendum)
Sandra, Weeks ([id number]) Visit Report for 12/26/2018 Chief Complaint Document Details Patient Name: Weeks, [name] Date of Service: 12/26/2018 1:45 PM Medical Record Number: [id number] Patient Account Number: [id number] Date of Birth/Sex: 01-Mar-1921 (85 y.o. F) Treating RN: Army Melia Primary Care Provider: [name] Other Clinician: Referring Provider: [name] Treating Provider/Extender: Melburn Hake, Hiroko Tregre Weeks in Treatment: 2 Information Obtained from: Patient Chief Complaint Left leg wound, referred by her Surgeon Electronic Signature(s) Signed: 12/26/2018 2:00:39 PM By: Worthy Keeler PA-C Entered By: Worthy Keeler on 12/26/2018 14:00:39 [name] ([id number]) -------------------------------------------------------------------------------- Debridement Details Patient Name: [name] B. Date of Service: 12/26/2018 1:45 PM Medical Record Number: [id number] Patient Account Number: [id number] Date of Birth/Sex: [birth year] (85 y.o. F) Treating RN: Army Melia Primary Care Provider: [name] Other Clinician: Referring Provider: [name] Treating Provider/Extender: Melburn Hake, Tomasa Dobransky Weeks in Treatment: 2 Debridement Performed for Wound #1 Left,Anterior Lower Leg Assessment: Performed By: Physician STONE III, Jerusha Reising E., PA-C Debridement Type: Debridement Level of Consciousness (Pre- Awake and Alert procedure): Pre-procedure Verification/Time Yes - 14:39 Out Taken: Start Time: 14:39 Total Area Debrided (L x W): 4.9 (cm) x 2.2 (cm) = 10.78 (cm) Tissue and other material Slough, Subcutaneous, Slough debrided: Level: Skin/Subcutaneous Tissue Debridement Description: Excisional Instrument: Curette Bleeding: Minimum Hemostasis Achieved: Pressure End Time: 14:41 Response to Treatment: Procedure was tolerated well Level of Consciousness Awake and Alert (Post-procedure): Post Debridement Measurements of Total Wound Length: (cm) 4.9 Width:  (cm) 2.2 Depth: (cm) 0.1 Volume: (cm) 0.847 Character of Wound/Ulcer Post Debridement: Stable Post Procedure Diagnosis Same as Pre-procedure Electronic Signature(s) Signed: 12/26/2018 4:19:55 PM By: Army Melia Signed: 12/26/2018 4:43:29 PM By: Worthy Keeler PA-C Entered By: Army Melia on 12/26/2018 14:40:14 Weeks, [name] ([id number]) -------------------------------------------------------------------------------- HPI Details Patient Name: [name] B. Date of Service: 12/26/2018 1:45 PM Medical Record Number: [id number] Patient Account Number: [id number] Date of Birth/Sex: 1920-06-16 (85 y.o. F) Treating RN: Army Melia Primary Care Provider: [name] Other Clinician: Referring Provider: [name] Treating Provider/Extender: Melburn Hake, Jakeisha Stricker Weeks in Treatment: 2 History of Present Illness HPI Description: 85 year old female who developed a left anterior leg skin tear more than a month ago with a flap on it, this first started out when family were helping her put on a Weeks of pants and the edge of the pant leg scraped and pulled off in the area of skin on the left anterior shin. She was seen at an urgent care for the skin tear where the used a scissors to cut the loose lying skin flap on top of the wound. To give her course of antibiotics which I believe was for 10 days that she completed almost a month ago. Since then the wound had been scabbed but she experienced a lot of pain in this area family has been using Vaseline on this area after using Neosporin for some time and she is referred to the clinic for further care of the wound with scab on it. Patient denies any fevers chills shakes. She is ambulatory and has family assist her when needed. Patient is at her baseline in terms of her activity level and functional status other than experiencing some pain in the left leg around the scab. ABIs were not obtained today on the left on account of significant pain  associated with the scabbing wound Patient has history of breast cancer status post mastectomy in remission, colon cancer status post colectomy in remission, iron deficiency for which she gets iron infusions, hypertension,  she is not on any blood thinners 12/19/18 on evaluation today patient actually appears to be redoing okay in regard to the skin tear on the left anterior lower extremity. With that being said this is still showing signs of a significant amount of necrotic tissue on the surface of the wound unfortunately. She's been "trying to let this scab" which obviously is not the right thing at this point. I do think that cental in keeping this area covered is gonna be the ideal way to get this to heal most appropriately. Obviously I explained to the patient as well as her son who was present during the evaluation today that we really need to try and get the necrotic tissue off of the surface of the wound in order to let this heal appropriately. Fortunately there's no signs of active infection. 12/26/2018 upon evaluation today patient appears to be doing better with regard to her left lower extremity ulcer. She has been tolerating the dressing changes specifically with the Santyl without complication. Fortunately there is no signs of active infection at this time. No fevers, chills, nausea, vomiting, or diarrhea. The dry dark eschar is loosening up and does seem to be doing much better today which is great news. Overall I am pleased with the progress. Electronic Signature(s) Signed: 12/26/2018 2:54:00 PM By: Worthy Keeler PA-C Entered By: Worthy Keeler on 12/26/2018 14:53:59 Weeks, [name] -------------------------------------------------------------------------------- Physical Exam Details Patient Name: Weeks, [name] Date of Service: 12/26/2018 1:45 PM Medical Record Number: [id number] Patient Account Number: [id number] Date of Birth/Sex: March 30, 1921 (85 y.o. F) Treating  RN: Army Melia Primary Care Provider: [name] Other Clinician: Referring Provider: [name] Treating Provider/Extender: Melburn Hake, Tmya Wigington Weeks in Treatment: 2 Constitutional Well-nourished and well-hydrated in no acute distress. Respiratory normal breathing without difficulty. Psychiatric this patient is able to make decisions and demonstrates good insight into disease process. Alert and Oriented x 3. pleasant and cooperative. Notes Patient's wound bed again showed some slough noted on the surface of the wound although this has softened up quite a bit compared to the prior evaluation. No fevers, chills, nausea, vomiting, or diarrhea. I did discuss sharp debridement with the patient and her son today and they did agree to this. I was able to remove some of the necrotic tissue from the surface of the wound which she tolerated without complication. Patient only had minimal discomfort. Post debridement wound bed appears to be doing significantly better which is excellent news. There was no significant bleeding. Electronic Signature(s) Signed: 12/26/2018 2:55:45 PM By: Worthy Keeler PA-C Entered By: Worthy Keeler on 12/26/2018 14:55:45 Jobe, [name] ([id number]) -------------------------------------------------------------------------------- Physician Orders Details Patient Name: [name] B. Date of Service: 12/26/2018 1:45 PM Medical Record Number: [id number] Patient Account Number: [id number] Date of Birth/Sex: 02-07-1921 (85 y.o. F) Treating RN: Army Melia Primary Care Provider: [name] Other Clinician: Referring Provider: [name] Treating Provider/Extender: Melburn Hake, Teion Ballin Weeks in Treatment: 2 Verbal / Phone Orders: No Diagnosis Coding ICD-10 Coding Code Description I87.2 Venous insufficiency (chronic) (peripheral) L97.822 Non-pressure chronic ulcer of other part of left lower leg with fat layer exposed Wound Cleansing Wound #1  Left,Anterior Lower Leg o Dial antibacterial soap, wash wounds, rinse and pat dry prior to dressing wounds o May Shower, gently pat wound dry prior to applying new dressing. Primary Wound Dressing Wound #1 Left,Anterior Lower Leg o Santyl Ointment Secondary Dressing Wound #1 Left,Anterior Lower Leg o Non-adherent pad - Secured with conform an tape.  Dressing Change Frequency Wound #1 Left,Anterior Lower Leg o Change dressing every day. Follow-up Appointments o Return Appointment in 1 week. Edema Control Wound #1 Left,Anterior Lower Leg o Other: - Tubigrip G Patient Medications Allergies: codeine Notifications Medication Indication Start End Santyl 12/26/2018 DOSE topical 250 unit/gram ointment - ointment topical Apply nickel thick to the wound bed and then cover with a dressing as directed in clinic. Electronic Signature(s) Signed: 12/26/2018 4:42:45 PM By: Worthy Keeler PA-C Previous Signature: 12/26/2018 4:19:55 PM Version By: Doreen Salvage ([id number]) Entered By: Worthy Keeler on 12/26/2018 16:42:44 Stauber, [name] ([id number]) -------------------------------------------------------------------------------- Problem List Details Patient Name: Strauser, Alora B. Date of Service: 12/26/2018 1:45 PM Medical Record Number: [id number] Patient Account Number: [id number] Date of Birth/Sex: [birth year] (85 y.o. F) Treating RN: Army Melia Primary Care Provider: [name] Other Clinician: Referring Provider: [name] Treating Provider/Extender: Melburn Hake, Naomie Crow Weeks in Treatment: 2 Active Problems ICD-10 Evaluated Encounter Code Description Active Date Today Diagnosis I87.2 Venous insufficiency (chronic) (peripheral) 12/10/2018 No Yes L97.822 Non-pressure chronic ulcer of other part of left lower leg with 12/10/2018 No Yes fat layer exposed Inactive Problems Resolved Problems Electronic Signature(s) Signed: 12/26/2018 2:00:32 PM By: Worthy Keeler PA-C Entered By: Worthy Keeler on 12/26/2018 14:00:31 Spengler, [name] ([id number]) -------------------------------------------------------------------------------- Progress Note Details Patient Name: Osment, [name] Date of Service: 12/26/2018 1:45 PM Medical Record Number: [id number] Patient Account Number: [id number] Date of Birth/Sex: Aug 13, 1920 (85 y.o. F) Treating RN: Army Melia Primary Care Provider: [name] Other Clinician: Referring Provider: [name] Treating Provider/Extender: Melburn Hake, Ryliegh Mcduffey Weeks in Treatment: 2 Subjective Chief Complaint Information obtained from Patient Left leg wound, referred by her Surgeon History of Present Illness (HPI) 85 year old female who developed a left anterior leg skin tear more than a month ago with a flap on it, this first started out when family were helping her put on a Weeks of pants and the edge of the pant leg scraped and pulled off in the area of skin on the left anterior shin. She was seen at an urgent care for the skin tear where the used a scissors to cut the loose lying skin flap on top of the wound. To give her course of antibiotics which I believe was for 10 days that she completed almost a month ago. Since then the wound had been scabbed but she experienced a lot of pain in this area family has been using Vaseline on this area after using Neosporin for some time and she is referred to the clinic for further care of the wound with scab on it. Patient denies any fevers chills shakes. She is ambulatory and has family assist her when needed. Patient is at her baseline in terms of her activity level and functional status other than experiencing some pain in the left leg around the scab. ABIs were not obtained today on the left on account of significant pain associated with the scabbing wound Patient has history of breast cancer status post mastectomy in remission, colon cancer status post colectomy in  remission, iron deficiency for which she gets iron infusions, hypertension, she is not on any blood thinners 12/19/18 on evaluation today patient actually appears to be redoing okay in regard to the skin tear on the left anterior lower extremity. With that being said this is still showing signs of a significant amount of necrotic tissue on the surface of the wound unfortunately. She's been "trying to let this scab" which obviously is  not the right thing at this point. I do think that cental in keeping this area covered is gonna be the ideal way to get this to heal most appropriately. Obviously I explained to the patient as well as her son who was present during the evaluation today that we really need to try and get the necrotic tissue off of the surface of the wound in order to let this heal appropriately. Fortunately there's no signs of active infection. 12/26/2018 upon evaluation today patient appears to be doing better with regard to her left lower extremity ulcer. She has been tolerating the dressing changes specifically with the Santyl without complication. Fortunately there is no signs of active infection at this time. No fevers, chills, nausea, vomiting, or diarrhea. The dry dark eschar is loosening up and does seem to be doing much better today which is great news. Overall I am pleased with the progress. Patient History Information obtained from Patient. Family History Cancer - Father,Mother,Siblings, No family history of Diabetes, Heart Disease, Hereditary Spherocytosis, Hypertension, Kidney Disease, Lung Disease, Seizures, Stroke, Thyroid Problems, Tuberculosis. Social History Never smoker, Marital Status - Widowed, Alcohol Use - Never, Drug Use - No History, Caffeine Use - Daily - coffee. Medical History Eyes Patient has history of Cataracts - both Hematologic/Lymphatic [name], [name] ([id number]) Denies history of Anemia, Hemophilia, Human Immunodeficiency Virus, Lymphedema,  Sickle Cell Disease Respiratory Denies history of Aspiration, Asthma, Chronic Obstructive Pulmonary Disease (COPD), Pneumothorax, Sleep Apnea, Tuberculosis Cardiovascular Patient has history of Hypertension Denies history of Angina, Arrhythmia, Congestive Heart Failure, Coronary Artery Disease, Deep Vein Thrombosis, Hypotension, Myocardial Infarction, Peripheral Arterial Disease, Peripheral Venous Disease, Phlebitis, Vasculitis Gastrointestinal Denies history of Cirrhosis , Colitis, Crohn s, Hepatitis A, Hepatitis B, Hepatitis C Endocrine Denies history of Type I Diabetes, Type II Diabetes Genitourinary Denies history of End Stage Renal Disease Immunological Denies history of Lupus Erythematosus, Raynaud s, Scleroderma Integumentary (Skin) Denies history of History of Burn, History of pressure wounds Musculoskeletal Denies history of Gout, Rheumatoid Arthritis, Osteoarthritis Neurologic Denies history of Dementia, Neuropathy, Quadriplegia, Paraplegia, Seizure Disorder Oncologic Denies history of Received Chemotherapy Psychiatric Denies history of Anorexia/bulimia, Confinement Anxiety Medical And Surgical History Notes Gastrointestinal ulcer issues Review of Systems (ROS) Constitutional Symptoms (General Health) Denies complaints or symptoms of Fatigue, Fever, Chills, Marked Weight Change. Respiratory Denies complaints or symptoms of Chronic or frequent coughs, Shortness of Breath. Cardiovascular Complains or has symptoms of LE edema. Denies complaints or symptoms of Chest pain. Psychiatric Denies complaints or symptoms of Anxiety, Claustrophobia. Objective Constitutional Well-nourished and well-hydrated in no acute distress. Vitals Time Taken: 1:54 PM, Height: 64 in, Weight: 148 lbs, BMI: 25.4, Temperature: 98.0 F, Pulse: 72 bpm, Respiratory Rate: 16 breaths/min, Blood Pressure: 152/65 mmHg. Respiratory Arns, [name] ([id number]) normal breathing without  difficulty. Psychiatric this patient is able to make decisions and demonstrates good insight into disease process. Alert and Oriented x 3. pleasant and cooperative. General Notes: Patient's wound bed again showed some slough noted on the surface of the wound although this has softened up quite a bit compared to the prior evaluation. No fevers, chills, nausea, vomiting, or diarrhea. I did discuss sharp debridement with the patient and her son today and they did agree to this. I was able to remove some of the necrotic tissue from the surface of the wound which she tolerated without complication. Patient only had minimal discomfort. Post debridement wound bed appears to be doing significantly better which is excellent news. There was no significant bleeding. Integumentary (  Hair, Skin) Wound #1 status is Open. Original cause of wound was Other Lesion. The wound is located on the Left,Anterior Lower Leg. The wound measures 4.9cm length x 2.2cm width x 0.1cm depth; 8.467cm^2 area and 0.847cm^3 volume. There is Fat Layer (Subcutaneous Tissue) Exposed exposed. There is no tunneling or undermining noted. There is a small amount of serous drainage noted. The wound margin is indistinct and nonvisible. There is small (1-33%) red granulation within the wound bed. There is a large (67-100%) amount of necrotic tissue within the wound bed including Eschar. Assessment Active Problems ICD-10 Venous insufficiency (chronic) (peripheral) Non-pressure chronic ulcer of other part of left lower leg with fat layer exposed Procedures Wound #1 Pre-procedure diagnosis of Wound #1 is a Skin Tear located on the Left,Anterior Lower Leg . There was a Excisional Skin/Subcutaneous Tissue Debridement with a total area of 10.78 sq cm performed by STONE III, [name] E., PA-C. With the following instrument(s): Curette Material removed includes Subcutaneous Tissue and Slough and. A time out was conducted at 14:39, prior to the start  of the procedure. A Minimum amount of bleeding was controlled with Pressure. The procedure was tolerated well. Post Debridement Measurements: 4.9cm length x 2.2cm width x 0.1cm depth; 0.847cm^3 volume. Character of Wound/Ulcer Post Debridement is stable. Post procedure Diagnosis Wound #1: Same as Pre-Procedure Plan Wound Cleansing: Wound #1 Left,Anterior Lower Leg: Dial antibacterial soap, wash wounds, rinse and pat dry prior to dressing wounds May Shower, gently pat wound dry prior to applying new dressing. [name], [name] ([id number]) Primary Wound Dressing: Wound #1 Left,Anterior Lower Leg: Santyl Ointment Secondary Dressing: Wound #1 Left,Anterior Lower Leg: Non-adherent pad - Secured with conform an tape. Dressing Change Frequency: Wound #1 Left,Anterior Lower Leg: Change dressing every day. Follow-up Appointments: Return Appointment in 1 week. Edema Control: Wound #1 Left,Anterior Lower Leg: Other: - Tubigrip G The following medication(s) was prescribed: Santyl topical 250 unit/gram ointment ointment topical Apply nickel thick to the wound bed and then cover with a dressing as directed in clinic. starting 12/26/2018 1. At this time I am to recommend that we continue with the [facility] for the time being since that seems to be beneficial for the patient she is in agreement with the plan. 2. Also would recommend that we continue with the Tubigrip to help at least some with compression as far as helping with the swelling is concerned which will also aid her in healing. 3. Post debridement the wound appears to be doing much better though I think that some continuation with the Santyl will continue to clean this up I am hopeful that she may be ready to proceed to a collagen based dressing by next week. 4. I did recommend that the patient as much as possible attempt to elevate her legs. We will see patient back for reevaluation in 1 week here in the clinic. If anything worsens or changes  patient will contact our office for additional recommendations. Electronic Signature(s) Signed: 12/26/2018 4:43:05 PM By: Worthy Keeler PA-C Previous Signature: 12/26/2018 2:56:48 PM Version By: Worthy Keeler PA-C Entered By: Worthy Keeler on 12/26/2018 16:43:05 [name], [name] ([id number]) -------------------------------------------------------------------------------- ROS/PFSH Details Patient Name: [name] B. Date of Service: 12/26/2018 1:45 PM Medical Record Number: [id number] Patient Account Number: [id number] Date of Birth/Sex: April 19, 1921 (85 y.o. F) Treating RN: Army Melia Primary Care Provider: [name] Other Clinician: Referring Provider: [name] Treating Provider/Extender: Melburn Hake, Jabar Krysiak Weeks in Treatment: 2 Information Obtained From Patient Constitutional Symptoms (General Health)  Complaints and Symptoms: Negative for: Fatigue; Fever; Chills; Marked Weight Change Respiratory Complaints and Symptoms: Negative for: Chronic or frequent coughs; Shortness of Breath Medical History: Negative for: Aspiration; Asthma; Chronic Obstructive Pulmonary Disease (COPD); Pneumothorax; Sleep Apnea; Tuberculosis Cardiovascular Complaints and Symptoms: Positive for: LE edema Negative for: Chest pain Medical History: Positive for: Hypertension Negative for: Angina; Arrhythmia; Congestive Heart Failure; Coronary Artery Disease; Deep Vein Thrombosis; Hypotension; Myocardial Infarction; Peripheral Arterial Disease; Peripheral Venous Disease; Phlebitis; Vasculitis Psychiatric Complaints and Symptoms: Negative for: Anxiety; Claustrophobia Medical History: Negative for: Anorexia/bulimia; Confinement Anxiety Eyes Medical History: Positive for: Cataracts - both Hematologic/Lymphatic Medical History: Negative for: Anemia; Hemophilia; Human Immunodeficiency Virus; Lymphedema; Sickle Cell Disease Gastrointestinal Medical History: Negative for: Cirrhosis ; Colitis;  Crohnos; Hepatitis A; Hepatitis B; Hepatitis C Past Medical History Notes: [name], [name] ([id number]) ulcer issues Endocrine Medical History: Negative for: Type I Diabetes; Type II Diabetes Genitourinary Medical History: Negative for: End Stage Renal Disease Immunological Medical History: Negative for: Lupus Erythematosus; Raynaudos; Scleroderma Integumentary (Skin) Medical History: Negative for: History of Burn; History of pressure wounds Musculoskeletal Medical History: Negative for: Gout; Rheumatoid Arthritis; Osteoarthritis Neurologic Medical History: Negative for: Dementia; Neuropathy; Quadriplegia; Paraplegia; Seizure Disorder Oncologic Medical History: Negative for: Received Chemotherapy HBO Extended History Items Eyes: Cataracts Immunizations Pneumococcal Vaccine: Received Pneumococcal Vaccination: Yes Implantable Devices None Family and Social History Cancer: Yes - Father,Mother,Siblings; Diabetes: No; Heart Disease: No; Hereditary Spherocytosis: No; Hypertension: No; Kidney Disease: No; Lung Disease: No; Seizures: No; Stroke: No; Thyroid Problems: No; Tuberculosis: No; Never smoker; Marital Status - Widowed; Alcohol Use: Never; Drug Use: No History; Caffeine Use: Daily - coffee; Financial Concerns: No; Food, Clothing or Shelter Needs: No; Support System Lacking: No; Transportation Concerns: No Physician Affirmation I have reviewed and agree with the above information. Electronic Signature(s) [name], [name] ([id number]) Signed: 12/26/2018 4:19:55 PM By: Army Melia Signed: 12/26/2018 4:43:29 PM By: Worthy Keeler PA-C Entered By: Worthy Keeler on 12/26/2018 14:55:20 [name], [name] ([id number]) -------------------------------------------------------------------------------- SuperBill Details Patient Name: [name] B. Date of Service: 12/26/2018 Medical Record Number: [id number] Patient Account Number: [id number] Date of Birth/Sex: Oct 12, 1920 (85 y.o.  F) Treating RN: Army Melia Primary Care Provider: [name] Other Clinician: Referring Provider: [name] Treating Provider/Extender: Melburn Hake, Miley Lindon Weeks in Treatment: 2 Diagnosis Coding ICD-10 Codes Code Description I87.2 Venous insufficiency (chronic) (peripheral) L97.822 Non-pressure chronic ulcer of other part of left lower leg with fat layer exposed Facility Procedures CPT4 Code Description: [id number] 11042 - DEB SUBQ TISSUE 20 SQ CM/< ICD-10 Diagnosis Description L97.822 Non-pressure chronic ulcer of other part of left lower leg with Modifier: fat layer expos Quantity: 1 ed Physician Procedures CPT4 Code Description: 7209470 11042 - WC PHYS SUBQ TISS 20 SQ CM ICD-10 Diagnosis Description L97.822 Non-pressure chronic ulcer of other part of left lower leg with Modifier: fat layer expos Quantity: 1 ed Electronic Signature(s) Signed: 12/26/2018 2:59:35 PM By: Worthy Keeler PA-C Entered By: Worthy Keeler on 12/26/2018 14:59:35

## 2018-12-26 NOTE — Progress Notes (Signed)
RAFEEF, LAU ([id number]) Visit Report for 12/26/2018 Arrival Information Details Patient Name: Buccheri, Danya B. Date of Service: 12/26/2018 1:45 PM Medical Record Number: [id number] Patient Account Number: [id number] Date of Birth/Sex: 1921/05/13 (85 y.o. F) Treating RN: Army Melia Primary Care Salmaan Patchin: [name] Other Clinician: Referring Bernie Ransford: [name] Treating Cashel Bellina/Extender: Melburn Hake, HOYT Weeks in Treatment: 2 Visit Information History Since Last Visit Added or deleted any medications: No Patient Arrived: Ambulatory Any new allergies or adverse reactions: No Arrival Time: 13:53 Had a fall or experienced change in No Accompanied By: son activities of daily living that may affect Transfer Assistance: None risk of falls: Signs or symptoms of abuse/neglect since last visito No Hospitalized since last visit: No Has Dressing in Place as Prescribed: Yes Pain Present Now: No Electronic Signature(s) Signed: 12/26/2018 4:19:55 PM By: Army Melia Entered By: Army Melia on 12/26/2018 13:53:55 Withey, Jackie Plum ([id number]) -------------------------------------------------------------------------------- Encounter Discharge Information Details Patient Name: [name] Date of Service: 12/26/2018 1:45 PM Medical Record Number: [id number] Patient Account Number: [id number] Date of Birth/Sex: 08-02-1920 (85 y.o. F) Treating RN: Army Melia Primary Care Symphoni Helbling: [name] Other Clinician: Referring Emeree Mahler: [name] Treating Aimi Essner/Extender: Melburn Hake, HOYT Weeks in Treatment: 2 Encounter Discharge Information Items Post Procedure Vitals Discharge Condition: Stable Temperature (F): 98.0 Ambulatory Status: Ambulatory Pulse (bpm): 72 Discharge Destination: Home Respiratory Rate (breaths/min): 16 Transportation: Private Auto Blood Pressure (mmHg): 152/65 Accompanied By: son Schedule Follow-up Appointment: Yes Clinical Summary of  Care: Electronic Signature(s) Signed: 12/26/2018 4:19:55 PM By: Army Melia Entered By: Army Melia on 12/26/2018 14:43:27 Eastham, Jackie Plum ([id number]) -------------------------------------------------------------------------------- Lower Extremity Assessment Details Patient Name: Velaquez, Makala B. Date of Service: 12/26/2018 1:45 PM Medical Record Number: [id number] Patient Account Number: [id number] Date of Birth/Sex: 1921-03-01 (85 y.o. F) Treating RN: Army Melia Primary Care Keirsten Matuska: [name] Other Clinician: Referring Kaiden Dardis: [name] Treating Wetona Viramontes/Extender: STONE III, HOYT Weeks in Treatment: 2 Edema Assessment Assessed: [Left: No] [Right: No] Edema: [Left: N] [Right: o] Vascular Assessment Pulses: Dorsalis Pedis Palpable: [Left:Yes] Electronic Signature(s) Signed: 12/26/2018 4:19:55 PM By: Army Melia Entered By: Army Melia on 12/26/2018 13:58:38 Wissinger, Loyda BMarland Kitchen ([id number]) -------------------------------------------------------------------------------- Multi Wound Chart Details Patient Name: Yeagle, [name] Date of Service: 12/26/2018 1:45 PM Medical Record Number: [id number] Patient Account Number: [id number] Date of Birth/Sex: March 10, 1921 (85 y.o. F) Treating RN: Army Melia Primary Care Rasheen Schewe: [name] Other Clinician: Referring Darnisha Vernet: [name] Treating Eastyn Skalla/Extender: Melburn Hake, HOYT Weeks in Treatment: 2 Vital Signs Height(in): 64 Pulse(bpm): 72 Weight(lbs): 148 Blood Pressure(mmHg): 152/65 Body Mass Index(BMI): 25 Temperature(F): 98.0 Respiratory Rate 16 (breaths/min): Photos: [N/A:N/A] Wound Location: Left Lower Leg - Anterior N/A N/A Wounding Event: Other Lesion N/A N/A Primary Etiology: Skin Tear N/A N/A Comorbid History: Cataracts, Hypertension N/A N/A Date Acquired: 11/06/2018 N/A N/A Weeks of Treatment: 2 N/A N/A Wound Status: Open N/A N/A Measurements L x W x D 4.9x2.2x0.1 N/A N/A (cm) Area (cm)  : 8.467 N/A N/A Volume (cm) : 0.847 N/A N/A % Reduction in Area: 50.10% N/A N/A % Reduction in Volume: 50.10% N/A N/A Classification: Partial Thickness N/A N/A Exudate Amount: Small N/A N/A Exudate Type: Serous N/A N/A Exudate Color: amber N/A N/A Wound Margin: Indistinct, nonvisible N/A N/A Granulation Amount: Small (1-33%) N/A N/A Granulation Quality: Red N/A N/A Necrotic Amount: Large (67-100%) N/A N/A Necrotic Tissue: Eschar N/A N/A Exposed Structures: Fat Layer (Subcutaneous N/A N/A Tissue) Exposed: Yes Fascia: No Tendon: No Muscle: No Joint: No Bone: No Olarte, Aquilla B. ([id number]) Epithelialization:  None N/A N/A Treatment Notes Electronic Signature(s) Signed: 12/26/2018 4:19:55 PM By: Army Melia Entered By: Army Melia on 12/26/2018 14:38:27 Hauth, Jackie Plum ([id number]) -------------------------------------------------------------------------------- [place] Details Patient Name: [name] Date of Service: 12/26/2018 1:45 PM Medical Record Number: [id number] Patient Account Number: [id number] Date of Birth/Sex: [birth year] (85 y.o. F) Treating RN: Army Melia Primary Care Mariajose Mow: [name] Other Clinician: Referring Meoshia Billing: [name] Treating Carsyn Taubman/Extender: Melburn Hake, HOYT Weeks in Treatment: 2 Active Inactive Necrotic Tissue Nursing Diagnoses: Impaired tissue integrity related to necrotic/devitalized tissue Goals: Necrotic/devitalized tissue will be minimized in the wound bed Date Initiated: 12/19/2018 Target Resolution Date: 12/25/2018 Goal Status: Active Interventions: Assess patient pain level pre-, during and post procedure and prior to discharge Treatment Activities: Enzymatic debridement : 12/19/2018 Notes: Orientation to the Wound Care Program Nursing Diagnoses: Knowledge deficit related to the wound healing center program Goals: Patient/caregiver will verbalize understanding of the [place] Date Initiated: 12/19/2018 Target Resolution Date: 12/25/2018 Goal Status: Active Interventions: Provide education on orientation to the wound center Notes: Pain, Acute or Chronic Nursing Diagnoses: Potential alteration in comfort, pain Goals: Patient will verbalize adequate pain control and receive pain control interventions during procedures as needed Date Initiated: 12/19/2018 Target Resolution Date: 12/25/2018 Goal Status: Active DALANIE, KISNER ([id number]) Interventions: Provide education on pain management Notes: Wound/Skin Impairment Nursing Diagnoses: Impaired tissue integrity Goals: Patient/caregiver will verbalize understanding of skin care regimen Date Initiated: 12/19/2018 Target Resolution Date: 12/25/2018 Goal Status: Active Interventions: Assess ulceration(s) every visit Treatment Activities: Skin care regimen initiated : 12/19/2018 Notes: Electronic Signature(s) Signed: 12/26/2018 4:19:55 PM By: Army Melia Entered By: Army Melia on 12/26/2018 14:38:19 Nold, Ailen BMarland Kitchen ([id number]) -------------------------------------------------------------------------------- Pain Assessment Details Patient Name: [name] Date of Service: 12/26/2018 1:45 PM Medical Record Number: [id number] Patient Account Number: [id number] Date of Birth/Sex: 1920/07/12 (85 y.o. F) Treating RN: Army Melia Primary Care Fidel Caggiano: [name] Other Clinician: Referring Monee Dembeck: [name] Treating Charlton Boule/Extender: Melburn Hake, HOYT Weeks in Treatment: 2 Active Problems Location of Pain Severity and Description of Pain Patient Has Paino No Site Locations Pain Management and Medication Current Pain Management: Electronic Signature(s) Signed: 12/26/2018 4:19:55 PM By: Army Melia Entered By: Army Melia on 12/26/2018 13:54:00 Blakeney, Jackie Plum ([id number]) -------------------------------------------------------------------------------- Patient/Caregiver Education  Details Patient Name: Elesa Massed. Date of Service: 12/26/2018 1:45 PM Medical Record Number: [id number] Patient Account Number: [id number] Date of Birth/Gender: 27-Oct-1920 (85 y.o. F) Treating RN: Army Melia Primary Care Physician: [name] Other Clinician: Referring Physician: [name] Treating Physician/Extender: Melburn Hake, HOYT Weeks in Treatment: 2 Education Assessment Education Provided To: Patient Education Topics Provided Wound/Skin Impairment: Handouts: Caring for Your Ulcer Methods: Demonstration, Explain/Verbal Responses: State content correctly Electronic Signature(s) Signed: 12/26/2018 4:19:55 PM By: Army Melia Entered By: Army Melia on 12/26/2018 14:42:08 Olarte, Lekia BMarland Kitchen ([id number]) -------------------------------------------------------------------------------- Wound Assessment Details Patient Name: Dsouza, Carrol B. Date of Service: 12/26/2018 1:45 PM Medical Record Number: [id number] Patient Account Number: [id number] Date of Birth/Sex: 1920-10-16 (85 y.o. F) Treating RN: Army Melia Primary Care Ashlye O[place]: [name] Other Clinician: Referring Dashia Caldeira: [name] Treating Ashleyanne Hemmingway/Extender: Melburn Hake, HOYT Weeks in Treatment: 2 Wound Status Wound Number: 1 Primary Etiology: Skin Tear Wound Location: Left Lower Leg - Anterior Wound Status: Open Wounding Event: Other Lesion Comorbid History: Cataracts, Hypertension Date Acquired: 11/06/2018 Weeks Of Treatment: 2 Clustered Wound: No Photos Wound Measurements Length: (cm) 4.9 % Reduction Width: (cm) 2.2 % Reduction Depth: (cm) 0.1 Epithelializ Area: (cm) 8.467 Tunneling: Volume: (cm)  0.847 Undermining in Area: 50.1% in Volume: 50.1% ation: None No : No Wound Description Classification: Partial Thickness Foul Odor A Wound Margin: Indistinct, nonvisible Slough/Fibr Exudate Amount: Small Exudate Type: Serous Exudate Color: amber fter Cleansing: No ino Yes Wound  Bed Granulation Amount: Small (1-33%) Exposed Structure Granulation Quality: Red Fascia Exposed: No Necrotic Amount: Large (67-100%) Fat Layer (Subcutaneous Tissue) Exposed: Yes Necrotic Quality: Eschar Tendon Exposed: No Muscle Exposed: No Joint Exposed: No Bone Exposed: No Treatment Notes Burdo, Dawna B. ([id number]) Wound #1 (Left, Anterior Lower Leg) Notes santyl, telfa and conform Electronic Signature(s) Signed: 12/26/2018 4:19:55 PM By: Army Melia Entered By: Army Melia on 12/26/2018 13:58:20 Vicente, Jackie Plum ([id number]) -------------------------------------------------------------------------------- Vitals Details Patient Name: [name] Date of Service: 12/26/2018 1:45 PM Medical Record Number: [id number] Patient Account Number: [id number] Date of Birth/Sex: [birth year] (85 y.o. F) Treating RN: Army Melia Primary Care Charleston Hankin: [name] Other Clinician: Referring [place]: [name] Treating Lazariah Savard/Extender: Melburn Hake, HOYT Weeks in Treatment: 2 Vital Signs Time Taken: 13:54 Temperature (F): 98.0 Height (in): 64 Pulse (bpm): 72 Weight (lbs): 148 Respiratory Rate (breaths/min): 16 Body Mass Index (BMI): 25.4 Blood Pressure (mmHg): 152/65 Reference Range: 80 - 120 mg / dl Electronic Signature(s) Signed: 12/26/2018 4:19:55 PM By: Army Melia Entered By: Army Melia on 12/26/2018 13:54:45

## 2019-01-02 ENCOUNTER — Other Ambulatory Visit: Payer: Self-pay

## 2019-01-02 ENCOUNTER — Encounter: Payer: Medicare Other | Attending: Physician Assistant | Admitting: Physician Assistant

## 2019-01-02 DIAGNOSIS — I872 Venous insufficiency (chronic) (peripheral): Secondary | ICD-10-CM | POA: Insufficient documentation

## 2019-01-02 DIAGNOSIS — Z9049 Acquired absence of other specified parts of digestive tract: Secondary | ICD-10-CM | POA: Diagnosis not present

## 2019-01-02 DIAGNOSIS — I10 Essential (primary) hypertension: Secondary | ICD-10-CM | POA: Insufficient documentation

## 2019-01-02 DIAGNOSIS — X58XXXA Exposure to other specified factors, initial encounter: Secondary | ICD-10-CM | POA: Diagnosis not present

## 2019-01-02 DIAGNOSIS — Z853 Personal history of malignant neoplasm of breast: Secondary | ICD-10-CM | POA: Diagnosis not present

## 2019-01-02 DIAGNOSIS — L97822 Non-pressure chronic ulcer of other part of left lower leg with fat layer exposed: Secondary | ICD-10-CM | POA: Diagnosis not present

## 2019-01-02 DIAGNOSIS — Z9010 Acquired absence of unspecified breast and nipple: Secondary | ICD-10-CM | POA: Insufficient documentation

## 2019-01-02 DIAGNOSIS — Z85038 Personal history of other malignant neoplasm of large intestine: Secondary | ICD-10-CM | POA: Insufficient documentation

## 2019-01-02 DIAGNOSIS — S81812A Laceration without foreign body, left lower leg, initial encounter: Secondary | ICD-10-CM | POA: Diagnosis not present

## 2019-01-02 NOTE — Progress Notes (Addendum)
DELORIA, BRASSFIELD ([id number]) Visit Report for 01/02/2019 Chief Complaint Document Details Patient Name: [name], [name] Date of Service: 01/02/2019 2:45 PM Medical Record Number: [id number] Patient Account Number: [id number] Date of Birth/Sex: 1921/05/22 (85 y.o. F) Treating RN: Army Melia Primary Care Provider: [name] Other Clinician: Referring Provider: [name] Treating Provider/Extender: Melburn Hake, HOYT Weeks in Treatment: 3 Information Obtained from: Patient Chief Complaint Left leg wound, referred by her Surgeon Electronic Signature(s) Signed: 01/02/2019 3:44:42 PM By: Worthy Keeler PA-C Entered By: Worthy Keeler on 01/02/2019 15:44:41 Moening, Jackie Plum ([id number]) -------------------------------------------------------------------------------- HPI Details Patient Name: Sandra Pair B. Date of Service: 01/02/2019 2:45 PM Medical Record Number: [id number] Patient Account Number: [id number] Date of Birth/Sex: 12/29/1920 (85 y.o. F) Treating RN: Army Melia Primary Care Provider: [name] Other Clinician: Referring Provider: [name] Treating Provider/Extender: Melburn Hake, HOYT Weeks in Treatment: 3 History of Present Illness HPI Description: 85 year old female who developed a left anterior leg skin tear more than a month ago with a flap on it, this first started out when family were helping her put on a pair of pants and the edge of the pant leg scraped and pulled off in the area of skin on the left anterior shin. She was seen at an urgent care for the skin tear where the used a scissors to cut the loose lying skin flap on top of the wound. To give her course of antibiotics which I believe was for 10 days that she completed almost a month ago. Since then the wound had been scabbed but she experienced a lot of pain in this area family has been using Vaseline on this area after using Neosporin for some time and she is referred to the clinic for further care  of the wound with scab on it. Patient denies any fevers chills shakes. She is ambulatory and has family assist her when needed. Patient is at her baseline in terms of her activity level and functional status other than experiencing some pain in the left leg around the scab. ABIs were not obtained today on the left on account of significant pain associated with the scabbing wound Patient has history of breast cancer status post mastectomy in remission, colon cancer status post colectomy in remission, iron deficiency for which she gets iron infusions, hypertension, she is not on any blood thinners 12/19/18 on evaluation today patient actually appears to be redoing okay in regard to the skin tear on the left anterior lower extremity. With that being said this is still showing signs of a significant amount of necrotic tissue on the surface of the wound unfortunately. She's been "trying to let this scab" which obviously is not the right thing at this point. I do think that cental in keeping this area covered is gonna be the ideal way to get this to heal most appropriately. Obviously I explained to the patient as well as her son who was present during the evaluation today that we really need to try and get the necrotic tissue off of the surface of the wound in order to let this heal appropriately. Fortunately there's no signs of active infection. 12/26/2018 upon evaluation today patient appears to be doing better with regard to her left lower extremity ulcer. She has been tolerating the dressing changes specifically with the Santyl without complication. Fortunately there is no signs of active infection at this time. No fevers, chills, nausea, vomiting, or diarrhea. The dry dark eschar is loosening up and does seem to  be doing much better today which is great news. Overall I am pleased with the progress. 01/02/19 on evaluation today patient actually appears to be doing excellent in regard to her lower  extremity ulcer. She's been tolerating the dressing changes without complication. Fortunately the slough is loosening up and I do believe that the Santyl has been very beneficial in this regard. With that being said I did take almost a week for them to get the medication from the pharmacy [facility] seems to take forever to get this in stock. Other than that I'm overall pleased with how things again are appearing today. Electronic Signature(s) Signed: 01/06/2019 10:14:52 AM By: Worthy Keeler PA-C Previous Signature: 01/03/2019 2:47:02 AM Version By: Worthy Keeler PA-C Entered By: Worthy Keeler on 01/06/2019 10:14:52 Grabert, Jackie Plum ([id number]) -------------------------------------------------------------------------------- Physical Exam Details Patient Name: [name], [name] Date of Service: 01/02/2019 2:45 PM Medical Record Number: [id number] Patient Account Number: [id number] Date of Birth/Sex: [birth year] (85 y.o. F) Treating RN: Army Melia Primary Care Provider: [name] Other Clinician: Referring Provider: [name] Treating Provider/Extender: Melburn Hake, HOYT Weeks in Treatment: 3 Constitutional Well-nourished and well-hydrated in no acute distress. Respiratory normal breathing without difficulty. clear to auscultation bilaterally. Cardiovascular regular rate and rhythm with normal S1, S2. Psychiatric this patient is able to make decisions and demonstrates good insight into disease process. Alert and Oriented x 3. pleasant and cooperative. Notes Patient's wound bed again did show some Slough noted on the surface of the wound although she actually states that last night was the first night this didn't bother her tremendously. For that reason I'm somewhat reluctant to actually perform sharp debridement today we had to do it prior weeks but nonetheless she did have a lot of pain following. I think right now we can continue to let the Santyl work and see if that will be  beneficial for her as opposed to sharp debridement today and will reevaluate and see were things stand next week. Electronic Signature(s) Signed: 01/03/2019 2:47:02 AM By: Worthy Keeler PA-C Entered By: Worthy Keeler on 01/02/2019 23:00:39 Dornfeld, Jackie Plum ([id number]) -------------------------------------------------------------------------------- Physician Orders Details Patient Name: Sandra Pair B. Date of Service: 01/02/2019 2:45 PM Medical Record Number: [id number] Patient Account Number: [id number] Date of Birth/Sex: [birth year] (85 y.o. F) Treating RN: Army Melia Primary Care Provider: [name] Other Clinician: Referring Provider: [name] Treating Provider/Extender: Melburn Hake, HOYT Weeks in Treatment: 3 Verbal / Phone Orders: No Diagnosis Coding ICD-10 Coding Code Description I87.2 Venous insufficiency (chronic) (peripheral) L97.822 Non-pressure chronic ulcer of other part of left lower leg with fat layer exposed Wound Cleansing Wound #1 Left,Anterior Lower Leg o Dial antibacterial soap, wash wounds, rinse and pat dry prior to dressing wounds o May Shower, gently pat wound dry prior to applying new dressing. Primary Wound Dressing Wound #1 Left,Anterior Lower Leg o Santyl Ointment Secondary Dressing Wound #1 Left,Anterior Lower Leg o Non-adherent pad - Secured with conform an tape. Dressing Change Frequency Wound #1 Left,Anterior Lower Leg o Change dressing every day. Follow-up Appointments o Return Appointment in 1 week. Edema Control Wound #1 Left,Anterior Lower Leg o Other: - Tubigrip G Electronic Signature(s) Signed: 01/02/2019 4:44:10 PM By: Army Melia Signed: 01/03/2019 2:47:02 AM By: Worthy Keeler PA-C Entered By: Army Melia on 01/02/2019 15:49:38 Stander, Jackie Plum ([id number]) -------------------------------------------------------------------------------- Problem List Details Patient Name: Basham, Alveena B. Date of Service:  01/02/2019 2:45 PM Medical Record Number: [id number] Patient Account Number: [id number] Date of Birth/Sex: 1921-03-21 (85  y.o. F) Treating RN: Army Melia Primary Care Provider: [name] Other Clinician: Referring Provider: [name] Treating Provider/Extender: Melburn Hake, HOYT Weeks in Treatment: 3 Active Problems ICD-10 Evaluated Encounter Code Description Active Date Today Diagnosis I87.2 Venous insufficiency (chronic) (peripheral) 12/10/2018 No Yes L97.822 Non-pressure chronic ulcer of other part of left lower leg with 12/10/2018 No Yes fat layer exposed Inactive Problems Resolved Problems Electronic Signature(s) Signed: 01/02/2019 3:44:34 PM By: Worthy Keeler PA-C Entered By: Worthy Keeler on 01/02/2019 15:44:34 Fosdick, Jackie Plum ([id number]) -------------------------------------------------------------------------------- Progress Note Details Patient Name: Hopman, [name] Date of Service: 01/02/2019 2:45 PM Medical Record Number: [id number] Patient Account Number: [id number] Date of Birth/Sex: Aug 06, 1920 (85 y.o. F) Treating RN: Army Melia Primary Care Provider: [name] Other Clinician: Referring Provider: [name] Treating Provider/Extender: Melburn Hake, HOYT Weeks in Treatment: 3 Subjective Chief Complaint Information obtained from Patient Left leg wound, referred by her Surgeon History of Present Illness (HPI) 85 year old female who developed a left anterior leg skin tear more than a month ago with a flap on it, this first started out when family were helping her put on a pair of pants and the edge of the pant leg scraped and pulled off in the area of skin on the left anterior shin. She was seen at an urgent care for the skin tear where the used a scissors to cut the loose lying skin flap on top of the wound. To give her course of antibiotics which I believe was for 10 days that she completed almost a month ago. Since then the wound had been scabbed  but she experienced a lot of pain in this area family has been using Vaseline on this area after using Neosporin for some time and she is referred to the clinic for further care of the wound with scab on it. Patient denies any fevers chills shakes. She is ambulatory and has family assist her when needed. Patient is at her baseline in terms of her activity level and functional status other than experiencing some pain in the left leg around the scab. ABIs were not obtained today on the left on account of significant pain associated with the scabbing wound Patient has history of breast cancer status post mastectomy in remission, colon cancer status post colectomy in remission, iron deficiency for which she gets iron infusions, hypertension, she is not on any blood thinners 12/19/18 on evaluation today patient actually appears to be redoing okay in regard to the skin tear on the left anterior lower extremity. With that being said this is still showing signs of a significant amount of necrotic tissue on the surface of the wound unfortunately. She's been "trying to let this scab" which obviously is not the right thing at this point. I do think that cental in keeping this area covered is gonna be the ideal way to get this to heal most appropriately. Obviously I explained to the patient as well as her son who was present during the evaluation today that we really need to try and get the necrotic tissue off of the surface of the wound in order to let this heal appropriately. Fortunately there's no signs of active infection. 12/26/2018 upon evaluation today patient appears to be doing better with regard to her left lower extremity ulcer. She has been tolerating the dressing changes specifically with the Santyl without complication. Fortunately there is no signs of active infection at this time. No fevers, chills, nausea, vomiting, or diarrhea. The dry dark eschar is  loosening up and does seem to be doing much  better today which is great news. Overall I am pleased with the progress. 01/02/19 on evaluation today patient actually appears to be doing excellent in regard to her lower extremity ulcer. She's been tolerating the dressing changes without complication. Fortunately the slough is loosening up and I do believe that the Santyl has been very beneficial in this regard. With that being said I did take almost a week for them to get the medication from the pharmacy [facility] seems to take forever to get this in stock. Other than that I'm overall pleased with how things again are appearing today. Patient History Information obtained from Patient. Family History Cancer - Father,Mother,Siblings, No family history of Diabetes, Heart Disease, Hereditary Spherocytosis, Hypertension, Kidney Disease, Lung Disease, Seizures, Stroke, Thyroid Problems, Tuberculosis. Social History REMMY, CRASS ([id number]) Never smoker, Marital Status - Widowed, Alcohol Use - Never, Drug Use - No History, Caffeine Use - Daily - coffee. Medical History Eyes Patient has history of Cataracts - both Hematologic/Lymphatic Denies history of Anemia, Hemophilia, Human Immunodeficiency Virus, Lymphedema, Sickle Cell Disease Respiratory Denies history of Aspiration, Asthma, Chronic Obstructive Pulmonary Disease (COPD), Pneumothorax, Sleep Apnea, Tuberculosis Cardiovascular Patient has history of Hypertension Denies history of Angina, Arrhythmia, Congestive Heart Failure, Coronary Artery Disease, Deep Vein Thrombosis, Hypotension, Myocardial Infarction, Peripheral Arterial Disease, Peripheral Venous Disease, Phlebitis, Vasculitis Gastrointestinal Denies history of Cirrhosis , Colitis, Crohn s, Hepatitis A, Hepatitis B, Hepatitis C Endocrine Denies history of Type I Diabetes, Type II Diabetes Genitourinary Denies history of End Stage Renal Disease Immunological Denies history of Lupus Erythematosus, Raynaud s,  Scleroderma Integumentary (Skin) Denies history of History of Burn, History of pressure wounds Musculoskeletal Denies history of Gout, Rheumatoid Arthritis, Osteoarthritis Neurologic Denies history of Dementia, Neuropathy, Quadriplegia, Paraplegia, Seizure Disorder Oncologic Denies history of Received Chemotherapy Psychiatric Denies history of Anorexia/bulimia, Confinement Anxiety Medical And Surgical History Notes Gastrointestinal ulcer issues Review of Systems (ROS) Constitutional Symptoms (General Health) Denies complaints or symptoms of Fatigue, Fever, Chills, Marked Weight Change. Respiratory Denies complaints or symptoms of Chronic or frequent coughs, Shortness of Breath. Cardiovascular Complains or has symptoms of LE edema. Denies complaints or symptoms of Chest pain. Psychiatric Denies complaints or symptoms of Anxiety, Claustrophobia. Objective Constitutional Resendes, Orlandria B. ([id number]) Well-nourished and well-hydrated in no acute distress. Vitals Time Taken: 3:03 PM, Height: 64 in, Weight: 148 lbs, BMI: 25.4, Temperature: 98.4 F, Pulse: 62 bpm, Respiratory Rate: 16 breaths/min, Blood Pressure: 141/75 mmHg. Respiratory normal breathing without difficulty. clear to auscultation bilaterally. Cardiovascular regular rate and rhythm with normal S1, S2. Psychiatric this patient is able to make decisions and demonstrates good insight into disease process. Alert and Oriented x 3. pleasant and cooperative. General Notes: Patient's wound bed again did show some Slough noted on the surface of the wound although she actually states that last night was the first night this didn't bother her tremendously. For that reason I'm somewhat reluctant to actually perform sharp debridement today we had to do it prior weeks but nonetheless she did have a lot of pain following. I think right now we can continue to let the Santyl work and see if that will be beneficial for her as opposed to  sharp debridement today and will reevaluate and see were things stand next week. Integumentary (Hair, Skin) Wound #1 status is Open. Original cause of wound was Other Lesion. The wound is located on the Left,Anterior Lower Leg. The wound measures 5cm length x 2cm width x  0.1cm depth; 7.854cm^2 area and 0.785cm^3 volume. There is Fat Layer (Subcutaneous Tissue) Exposed exposed. There is no tunneling or undermining noted. There is a medium amount of serosanguineous drainage noted. The wound margin is indistinct and nonvisible. There is small (1-33%) red granulation within the wound bed. There is a large (67-100%) amount of necrotic tissue within the wound bed including Eschar. Assessment Active Problems ICD-10 Venous insufficiency (chronic) (peripheral) Non-pressure chronic ulcer of other part of left lower leg with fat layer exposed Plan Wound Cleansing: Wound #1 Left,Anterior Lower Leg: Dial antibacterial soap, wash wounds, rinse and pat dry prior to dressing wounds May Shower, gently pat wound dry prior to applying new dressing. Primary Wound Dressing: Wound #1 Left,Anterior Lower Leg: Santyl Ointment Secondary Dressing: Wound #1 Left,Anterior Lower Leg: Non-adherent pad - Secured with conform an tape. CHELCIE, ESTORGA ([id number]) Dressing Change Frequency: Wound #1 Left,Anterior Lower Leg: Change dressing every day. Follow-up Appointments: Return Appointment in 1 week. Edema Control: Wound #1 Left,Anterior Lower Leg: Other: - Tubigrip G 1. My suggestion initially here his going to be that we continue with the Santyl ointment so that she will continue with chemical debridement of the wound bed she has new granulation forming this is good news I think the Santyl will promote a cleaner wound surface. 2. We will subsequently continue with the Tubigrip for the time being to help with some of the fluid control this is also something that they can change and remove on a daily basis in  order to perform the dressing changes with the Santyl. 3. I'm gonna suggest that the patient continued elevate her legs as much as possible at this point. Please see above for specific wound care orders. We will see patient for re-evaluation in 1 week(s) here in the clinic. If anything worsens or changes patient will contact our office for additional recommendations. Electronic Signature(s) Signed: 01/06/2019 10:15:05 AM By: Worthy Keeler PA-C Previous Signature: 01/03/2019 2:47:02 AM Version By: Worthy Keeler PA-C Entered By: Worthy Keeler on 01/06/2019 10:15:05 Spielberg, Jackie Plum ([id number]) -------------------------------------------------------------------------------- ROS/PFSH Details Patient Name: Sandra Pair B. Date of Service: 01/02/2019 2:45 PM Medical Record Number: [id number] Patient Account Number: [id number] Date of Birth/Sex: Jul 04, 1920 (85 y.o. F) Treating RN: Army Melia Primary Care Provider: [name] Other Clinician: Referring Provider: [name] Treating Provider/Extender: Melburn Hake, HOYT Weeks in Treatment: 3 Information Obtained From Patient Constitutional Symptoms (General Health) Complaints and Symptoms: Negative for: Fatigue; Fever; Chills; Marked Weight Change Respiratory Complaints and Symptoms: Negative for: Chronic or frequent coughs; Shortness of Breath Medical History: Negative for: Aspiration; Asthma; Chronic Obstructive Pulmonary Disease (COPD); Pneumothorax; Sleep Apnea; Tuberculosis Cardiovascular Complaints and Symptoms: Positive for: LE edema Negative for: Chest pain Medical History: Positive for: Hypertension Negative for: Angina; Arrhythmia; Congestive Heart Failure; Coronary Artery Disease; Deep Vein Thrombosis; Hypotension; Myocardial Infarction; Peripheral Arterial Disease; Peripheral Venous Disease; Phlebitis; Vasculitis Psychiatric Complaints and Symptoms: Negative for: Anxiety; Claustrophobia Medical History: Negative  for: Anorexia/bulimia; Confinement Anxiety Eyes Medical History: Positive for: Cataracts - both Hematologic/Lymphatic Medical History: Negative for: Anemia; Hemophilia; Human Immunodeficiency Virus; Lymphedema; Sickle Cell Disease Gastrointestinal Medical History: Negative for: Cirrhosis ; Colitis; Crohnos; Hepatitis A; Hepatitis B; Hepatitis C Past Medical History Notes: TACY, CHAVIS ([id number]) ulcer issues Endocrine Medical History: Negative for: Type I Diabetes; Type II Diabetes Genitourinary Medical History: Negative for: End Stage Renal Disease Immunological Medical History: Negative for: Lupus Erythematosus; Raynaudos; Scleroderma Integumentary (Skin) Medical History: Negative for: History of Burn; History of pressure wounds  Musculoskeletal Medical History: Negative for: Gout; Rheumatoid Arthritis; Osteoarthritis Neurologic Medical History: Negative for: Dementia; Neuropathy; Quadriplegia; Paraplegia; Seizure Disorder Oncologic Medical History: Negative for: Received Chemotherapy HBO Extended History Items Eyes: Cataracts Immunizations Pneumococcal Vaccine: Received Pneumococcal Vaccination: Yes Implantable Devices None Family and Social History Cancer: Yes - Father,Mother,Siblings; Diabetes: No; Heart Disease: No; Hereditary Spherocytosis: No; Hypertension: No; Kidney Disease: No; Lung Disease: No; Seizures: No; Stroke: No; Thyroid Problems: No; Tuberculosis: No; Never smoker; Marital Status - Widowed; Alcohol Use: Never; Drug Use: No History; Caffeine Use: Daily - coffee; Financial Concerns: No; Food, Clothing or Shelter Needs: No; Support System Lacking: No; Transportation Concerns: No Physician Affirmation I have reviewed and agree with the above information. Electronic Signature(s) DARLYNN, RICCO ([id number]) Signed: 01/03/2019 2:47:02 AM By: Worthy Keeler PA-C Signed: 01/03/2019 11:48:59 AM By: Army Melia Entered By: Worthy Keeler on 01/02/2019  23:00:20 Towry, Jackie Plum ([id number]) -------------------------------------------------------------------------------- SuperBill Details Patient Name: Troupe, [name] Date of Service: 01/02/2019 Medical Record Number: [id number] Patient Account Number: [id number] Date of Birth/Sex: December 09, 1920 (85 y.o. F) Treating RN: Army Melia Primary Care Provider: [name] Other Clinician: Referring Provider: [name] Treating Provider/Extender: Melburn Hake, HOYT Weeks in Treatment: 3 Diagnosis Coding ICD-10 Codes Code Description I87.2 Venous insufficiency (chronic) (peripheral) L97.822 Non-pressure chronic ulcer of other part of left lower leg with fat layer exposed Facility Procedures CPT4 Code: [id number] Description: [facility] VISIT-LEV 3 EST PT Modifier: Quantity: 1 Physician Procedures CPT4 Code Description: [id number] - WC PHYS LEVEL 4 - EST PT ICD-10 Diagnosis Description I87.2 Venous insufficiency (chronic) (peripheral) L97.822 Non-pressure chronic ulcer of other part of left lower leg wit Modifier: h fat layer expos Quantity: 1 ed Electronic Signature(s) Signed: 01/03/2019 2:47:02 AM By: Worthy Keeler PA-C Entered By: Worthy Keeler on 01/02/2019 23:02:43

## 2019-01-03 NOTE — Progress Notes (Signed)
SHAMIEKA, GULLO ([id number]) Visit Report for 01/02/2019 Arrival Information Details Patient Name: [name], [name] Date of Service: 01/02/2019 2:45 PM Medical Record Number: [id number] Patient Account Number: [id number] Date of Birth/Sex: 10-13-1920 (85 y.o. F) Treating RN: Cornell Barman Primary Care Kayon Dozier: [name] Other Clinician: Referring Britteny Fiebelkorn: [name] Treating Salmaan Patchin/Extender: Melburn Hake, HOYT Weeks in Treatment: 3 Visit Information History Since Last Visit Added or deleted any medications: No Patient Arrived: [name] Any new allergies or adverse reactions: No Arrival Time: 15:02 Had a fall or experienced change in No Accompanied By: daughter activities of daily living that may affect Transfer Assistance: None risk of falls: Patient Identification Verified: Yes Signs or symptoms of abuse/neglect since last visito No Secondary Verification Process Completed: Yes Hospitalized since last visit: No Patient Requires Transmission-Based Precautions: No Implantable device outside of the clinic excluding No Patient Has Alerts: No cellular tissue based products placed in the center since last visit: Has Dressing in Place as Prescribed: Yes Pain Present Now: No Electronic Signature(s) Signed: 01/03/2019 5:41:45 PM By: Gretta Cool, BSN, RN, CWS, Kim RN, BSN Entered By: Gretta Cool, BSN, RN, CWS, Kim on 01/02/2019 15:02:50 Kugler, Jackie Plum ([id number]) -------------------------------------------------------------------------------- Clinic Level of Care Assessment Details Patient Name: Dillon, [name] Date of Service: 01/02/2019 2:45 PM Medical Record Number: [id number] Patient Account Number: [id number] Date of Birth/Sex: 03/01/1921 (85 y.o. F) Treating RN: Army Melia Primary Care Yoav Okane: [name] Other Clinician: Referring Cordarius Benning: [name] Treating Nandita Mathenia/Extender: Melburn Hake, HOYT Weeks in Treatment: 3 Clinic Level of Care Assessment Items TOOL 4 Quantity  Score []  - Use when only an EandM is performed on FOLLOW-UP visit 0 ASSESSMENTS - Nursing Assessment / Reassessment X - Reassessment of Co-morbidities (includes updates in patient status) 1 10 X- 1 5 Reassessment of Adherence to Treatment Plan ASSESSMENTS - Wound and Skin Assessment / Reassessment X - Simple Wound Assessment / Reassessment - one wound 1 5 []  - 0 Complex Wound Assessment / Reassessment - multiple wounds []  - 0 Dermatologic / Skin Assessment (not related to wound area) ASSESSMENTS - Focused Assessment []  - Circumferential Edema Measurements - multi extremities 0 []  - 0 Nutritional Assessment / Counseling / Intervention []  - 0 Lower Extremity Assessment (monofilament, tuning fork, pulses) []  - 0 Peripheral Arterial Disease Assessment (using hand held doppler) ASSESSMENTS - Ostomy and/or Continence Assessment and Care []  - Incontinence Assessment and Management 0 []  - 0 Ostomy Care Assessment and Management (repouching, etc.) PROCESS - Coordination of Care X - Simple Patient / Family Education for ongoing care 1 15 []  - 0 Complex (extensive) Patient / Family Education for ongoing care []  - 0 Staff obtains Programmer, systems, Records, Test Results / Process Orders []  - 0 Staff telephones HHA, Nursing Homes / Clarify orders / etc []  - 0 Routine Transfer to another Facility (non-emergent condition) []  - 0 Routine Hospital Admission (non-emergent condition) []  - 0 New Admissions / Biomedical engineer / Ordering NPWT, Apligraf, etc. []  - 0 Emergency Hospital Admission (emergent condition) X- 1 10 Simple Discharge Coordination Kuehnel, Tanielle B. ([id number]) []  - 0 Complex (extensive) Discharge Coordination PROCESS - Special Needs []  - Pediatric / Minor Patient Management 0 []  - 0 Isolation Patient Management []  - 0 Hearing / Language / Visual special needs []  - 0 Assessment of Community assistance (transportation, D/C planning, etc.) []  - 0 Additional assistance  / Altered mentation []  - 0 Support Surface(s) Assessment (bed, cushion, seat, etc.) INTERVENTIONS - Wound Cleansing / Measurement X - Simple Wound Cleansing - one  wound 1 5 []  - 0 Complex Wound Cleansing - multiple wounds X- 1 5 Wound Imaging (photographs - any number of wounds) []  - 0 Wound Tracing (instead of photographs) X- 1 5 Simple Wound Measurement - one wound []  - 0 Complex Wound Measurement - multiple wounds INTERVENTIONS - Wound Dressings []  - Small Wound Dressing one or multiple wounds 0 X- 1 15 Medium Wound Dressing one or multiple wounds []  - 0 Large Wound Dressing one or multiple wounds []  - 0 Application of Medications - topical []  - 0 Application of Medications - injection INTERVENTIONS - Miscellaneous []  - External ear exam 0 []  - 0 Specimen Collection (cultures, biopsies, blood, body fluids, etc.) []  - 0 Specimen(s) / Culture(s) sent or taken to Lab for analysis []  - 0 Patient Transfer (multiple staff / Civil Service fast streamer / Similar devices) []  - 0 Simple Staple / Suture removal (25 or less) []  - 0 Complex Staple / Suture removal (26 or more) []  - 0 Hypo / Hyperglycemic Management (close monitor of Blood Glucose) []  - 0 Ankle / Brachial Index (ABI) - do not check if billed separately X- 1 5 Vital Signs Pownall, Arcadia B. ([id number]) Has the patient been seen at the hospital within the last three years: Yes Total Score: 80 Level Of Care: New/Established - Level 3 Electronic Signature(s) Signed: 01/02/2019 4:44:10 PM By: Army Melia Entered By: Army Melia on 01/02/2019 15:50:17 Klinge, Jackie Plum ([id number]) -------------------------------------------------------------------------------- Encounter Discharge Information Details Patient Name: Sandra Pair B. Date of Service: 01/02/2019 2:45 PM Medical Record Number: [id number] Patient Account Number: [id number] Date of Birth/Sex: [birth year] (85 y.o. F) Treating RN: Army Melia Primary Care Jonette Wassel: [name] Other Clinician: Referring Drisana Schweickert: [name] Treating Theseus Birnie/Extender: Melburn Hake, HOYT Weeks in Treatment: 3 Encounter Discharge Information Items Discharge Condition: Stable Ambulatory Status: Ambulatory Discharge Destination: Home Transportation: Private Auto Accompanied By: daughter Schedule Follow-up Appointment: Yes Clinical Summary of Care: Electronic Signature(s) Signed: 01/02/2019 4:44:10 PM By: Army Melia Entered By: Army Melia on 01/02/2019 15:51:29 Fiallos, Jackie Plum ([id number]) -------------------------------------------------------------------------------- Lower Extremity Assessment Details Patient Name: Brinegar, Sibel B. Date of Service: 01/02/2019 2:45 PM Medical Record Number: [id number] Patient Account Number: [id number] Date of Birth/Sex: 1920/10/16 (85 y.o. F) Treating RN: Cornell Barman Primary Care Evony Rezek: [name] Other Clinician: Referring Dionte Blaustein: [name] Treating Adrienne Trombetta/Extender: Melburn Hake, HOYT Weeks in Treatment: 3 Vascular Assessment Pulses: Dorsalis Pedis Palpable: [Left:Yes] Electronic Signature(s) Signed: 01/03/2019 5:41:45 PM By: Gretta Cool, BSN, RN, CWS, Kim RN, BSN Entered By: Gretta Cool, BSN, RN, CWS, Kim on 01/02/2019 15:09:55 Eastburn, Jackie Plum ([id number]) -------------------------------------------------------------------------------- Multi Wound Chart Details Patient Name: Sandra Pair B. Date of Service: 01/02/2019 2:45 PM Medical Record Number: [id number] Patient Account Number: [id number] Date of Birth/Sex: 1920-08-20 (85 y.o. F) Treating RN: Army Melia Primary Care Kealan Buchan: [name] Other Clinician: Referring Chidera Thivierge: [name] Treating Jaman Aro/Extender: Melburn Hake, HOYT Weeks in Treatment: 3 Vital Signs Height(in): 64 Pulse(bpm): 77 Weight(lbs): 148 Blood Pressure(mmHg): 141/75 Body Mass Index(BMI): 25 Temperature(F): 98.4 Respiratory Rate 16 (breaths/min): Photos: [N/A:N/A] Wound Location:  Left Lower Leg - Anterior N/A N/A Wounding Event: Other Lesion N/A N/A Primary Etiology: Skin Tear N/A N/A Comorbid History: Cataracts, Hypertension N/A N/A Date Acquired: 11/06/2018 N/A N/A Weeks of Treatment: 3 N/A N/A Wound Status: Open N/A N/A Measurements L x W x D 5x2x0.1 N/A N/A (cm) Area (cm) : 7.854 N/A N/A Volume (cm) : 0.785 N/A N/A % Reduction in Area: 53.70% N/A N/A % Reduction in Volume: 53.70% N/A N/A Classification: Full  Thickness Without N/A N/A Exposed Support Structures Exudate Amount: Medium N/A N/A Exudate Type: Serosanguineous N/A N/A Exudate Color: red, brown N/A N/A Wound Margin: Indistinct, nonvisible N/A N/A Granulation Amount: Small (1-33%) N/A N/A Granulation Quality: Red N/A N/A Necrotic Amount: Large (67-100%) N/A N/A Necrotic Tissue: Eschar N/A N/A Exposed Structures: Fat Layer (Subcutaneous N/A N/A Tissue) Exposed: Yes Fascia: No Tendon: No Muscle: No Uzzle, Cassidee B. ([id number]) Joint: No Bone: No Epithelialization: None N/A N/A Treatment Notes Electronic Signature(s) Signed: 01/02/2019 4:44:10 PM By: Army Melia Entered By: Army Melia on 01/02/2019 15:47:32 Schnelle, Jackie Plum ([id number]) -------------------------------------------------------------------------------- [place] Details Patient Name: Sandra Pair B. Date of Service: 01/02/2019 2:45 PM Medical Record Number: [id number] Patient Account Number: [id number] Date of Birth/Sex: Aug 13, 1920 (85 y.o. F) Treating RN: Army Melia Primary Care Emanuel Dowson: [name] Other Clinician: Referring Stassi Fadely: [name] Treating Brittiney Dicostanzo/Extender: Melburn Hake, HOYT Weeks in Treatment: 3 Active Inactive Necrotic Tissue Nursing Diagnoses: Impaired tissue integrity related to necrotic/devitalized tissue Goals: Necrotic/devitalized tissue will be minimized in the wound bed Date Initiated: 12/19/2018 Target Resolution Date: 12/25/2018 Goal Status:  Active Interventions: Assess patient pain level pre-, during and post procedure and prior to discharge Treatment Activities: Enzymatic debridement : 12/19/2018 Notes: Orientation to the Wound Care Program Nursing Diagnoses: Knowledge deficit related to the wound healing center program Goals: Patient/caregiver will verbalize understanding of the [place] Date Initiated: 12/19/2018 Target Resolution Date: 12/25/2018 Goal Status: Active Interventions: Provide education on orientation to the wound center Notes: Pain, Acute or Chronic Nursing Diagnoses: Potential alteration in comfort, pain Goals: Patient will verbalize adequate pain control and receive pain control interventions during procedures as needed Date Initiated: 12/19/2018 Target Resolution Date: 12/25/2018 Goal Status: Active SADEEN, WIEGEL ([id number]) Interventions: Provide education on pain management Notes: Wound/Skin Impairment Nursing Diagnoses: Impaired tissue integrity Goals: Patient/caregiver will verbalize understanding of skin care regimen Date Initiated: 12/19/2018 Target Resolution Date: 12/25/2018 Goal Status: Active Interventions: Assess ulceration(s) every visit Treatment Activities: Skin care regimen initiated : 12/19/2018 Notes: Electronic Signature(s) Signed: 01/02/2019 4:44:10 PM By: Army Melia Entered By: Army Melia on 01/02/2019 15:47:23 Maxham, Jackie Plum ([id number]) -------------------------------------------------------------------------------- Pain Assessment Details Patient Name: Sandra Pair B. Date of Service: 01/02/2019 2:45 PM Medical Record Number: [id number] Patient Account Number: [id number] Date of Birth/Sex: [birth year] (85 y.o. F) Treating RN: Cornell Barman Primary Care Lakoda Raske: [name] Other Clinician: Referring Yola Paradiso: [name] Treating Tharon Bomar/Extender: Melburn Hake, HOYT Weeks in Treatment: 3 Active Problems Location of Pain Severity and  Description of Pain Patient Has Paino No Site Locations Pain Management and Medication Current Pain Management: Notes Patient denies pain at this time. Electronic Signature(s) Signed: 01/03/2019 5:41:45 PM By: Gretta Cool, BSN, RN, CWS, Kim RN, BSN Entered By: Gretta Cool, BSN, RN, CWS, Kim on 01/02/2019 15:03:06 Aguinaldo, Jackie Plum ([id number]) -------------------------------------------------------------------------------- Patient/Caregiver Education Details Patient Name: Elesa Massed Date of Service: 01/02/2019 2:45 PM Medical Record Number: [id number] Patient Account Number: [id number] Date of Birth/Gender: 16-Oct-1920 (85 y.o. F) Treating RN: Army Melia Primary Care Physician: [name] Other Clinician: Referring Physician: [name] Treating Physician/Extender: Sharalyn Ink in Treatment: 3 Education Assessment Education Provided To: Patient Education Topics Provided Welcome To [facility]: Wound/Skin Impairment: Handouts: Caring for Your Ulcer Methods: Demonstration, Explain/Verbal Responses: State content correctly Electronic Signature(s) Signed: 01/02/2019 4:44:10 PM By: Army Melia Entered By: Army Melia on 01/02/2019 15:50:36 Cuffee, Jackie Plum ([id number]) -------------------------------------------------------------------------------- Wound Assessment Details Patient Name: Witz, Ireta B. Date of Service: 01/02/2019 2:45 PM Medical Record  Number: [id number] Patient Account Number: [id number] Date of Birth/Sex: 01-10-1921 (85 y.o. F) Treating RN: Cornell Barman Primary Care Jaeshawn Silvio: [name] Other Clinician: Referring Kaeden Mester: [name] Treating Kenesha Moshier/Extender: Melburn Hake, HOYT Weeks in Treatment: 3 Wound Status Wound Number: 1 Primary Etiology: Skin Tear Wound Location: Left Lower Leg - Anterior Wound Status: Open Wounding Event: Other Lesion Comorbid History: Cataracts, Hypertension Date Acquired: 11/06/2018 Weeks Of Treatment:  3 Clustered Wound: No Photos Wound Measurements Length: (cm) 5 Width: (cm) 2 Depth: (cm) 0.1 Area: (cm) 7.854 Volume: (cm) 0.785 % Reduction in Area: 53.7% % Reduction in Volume: 53.7% Epithelialization: None Tunneling: No Undermining: No Wound Description Full Thickness Without Exposed Support Foul Odo Classification: Structures Slough/F Wound Margin: Indistinct, nonvisible Exudate Medium Amount: Exudate Type: Serosanguineous Exudate Color: red, brown r After Cleansing: No ibrino Yes Wound Bed Granulation Amount: Small (1-33%) Exposed Structure Granulation Quality: Red Fascia Exposed: No Necrotic Amount: Large (67-100%) Fat Layer (Subcutaneous Tissue) Exposed: Yes Necrotic Quality: Eschar Tendon Exposed: No Muscle Exposed: No Joint Exposed: No Bone Exposed: No Blissett, Nelissa B. ([id number]) Treatment Notes Wound #1 (Left, Anterior Lower Leg) Notes santyl, telfa and conform, tubigrip G Electronic Signature(s) Signed: 01/03/2019 5:41:45 PM By: Gretta Cool, BSN, RN, CWS, Kim RN, BSN Entered By: Gretta Cool, BSN, RN, CWS, Kim on 01/02/2019 15:09:31 Baudoin, Jackie Plum ([id number]) -------------------------------------------------------------------------------- Vitals Details Patient Name: Sandra Pair B. Date of Service: 01/02/2019 2:45 PM Medical Record Number: [id number] Patient Account Number: [id number] Date of Birth/Sex: 1921-04-12 (85 y.o. F) Treating RN: Cornell Barman Primary Care Tayler Heiden: [name] Other Clinician: Referring Dorella Laster: [name] Treating Reganne Messerschmidt/Extender: Melburn Hake, HOYT Weeks in Treatment: 3 Vital Signs Time Taken: 15:03 Temperature (F): 98.4 Height (in): 64 Pulse (bpm): 62 Weight (lbs): 148 Respiratory Rate (breaths/min): 16 Body Mass Index (BMI): 25.4 Blood Pressure (mmHg): 141/75 Reference Range: 80 - 120 mg / dl Electronic Signature(s) Signed: 01/03/2019 5:41:45 PM By: Gretta Cool, BSN, RN, CWS, Kim RN, BSN Entered By: Gretta Cool, BSN, RN,  CWS, Kim on 01/02/2019 15:04:53

## 2019-01-09 ENCOUNTER — Encounter: Payer: Medicare Other | Admitting: Physician Assistant

## 2019-01-09 ENCOUNTER — Other Ambulatory Visit: Payer: Self-pay

## 2019-01-09 DIAGNOSIS — S81812A Laceration without foreign body, left lower leg, initial encounter: Secondary | ICD-10-CM | POA: Diagnosis not present

## 2019-01-09 NOTE — Progress Notes (Addendum)
KENNETHIA, LYNES ([id number]) Visit Report for 01/09/2019 Chief Complaint Document Details Patient Name: Weeks, [name] Date of Service: 01/09/2019 3:30 PM Medical Record Number: [id number] Patient Account Number: [id number] Date of Birth/Sex: 11/11/1920 (85 y.o. F) Treating RN: Army Melia Primary Care Provider: [name] Other Clinician: Referring Provider: [name] Treating Provider/Extender: Melburn Hake, HOYT Weeks in Treatment: 4 Information Obtained from: Patient Chief Complaint Left leg wound, referred by her Surgeon Electronic Signature(s) Signed: 01/09/2019 3:55:48 PM By: Worthy Keeler PA-C Entered By: Worthy Keeler on 01/09/2019 15:55:48 Weeks, [name] ([id number]) -------------------------------------------------------------------------------- Debridement Details Patient Name: Sandra Pair B. Date of Service: 01/09/2019 3:30 PM Medical Record Number: [id number] Patient Account Number: [id number] Date of Birth/Sex: 11-07-1920 (85 y.o. F) Treating RN: Army Melia Primary Care Provider: [name] Other Clinician: Referring Provider: [name] Treating Provider/Extender: Melburn Hake, HOYT Weeks in Treatment: 4 Debridement Performed for Wound #1 Left,Anterior Lower Leg Assessment: Performed By: Physician STONE III, HOYT E., PA-C Debridement Type: Debridement Level of Consciousness (Pre- Awake and Alert procedure): Pre-procedure Verification/Time Yes - 16:05 Out Taken: Start Time: 16:07 Pain Control: Lidocaine Total Area Debrided (L x W): 5 (cm) x 1.9 (cm) = 9.5 (cm) Tissue and other material Viable, Non-Viable, Slough, Subcutaneous, Slough debrided: Level: Skin/Subcutaneous Tissue Debridement Description: Excisional Instrument: Curette Bleeding: Minimum Hemostasis Achieved: Pressure End Time: 16:09 Response to Treatment: Procedure was tolerated well Level of Consciousness Awake and Alert (Post-procedure): Post Debridement Measurements  of Total Wound Length: (cm) 5 Width: (cm) 1.9 Depth: (cm) 0.1 Volume: (cm) 0.746 Character of Wound/Ulcer Post Debridement: Stable Post Procedure Diagnosis Same as Pre-procedure Electronic Signature(s) Signed: 01/09/2019 4:39:13 PM By: Army Melia Signed: 01/09/2019 10:42:11 PM By: Worthy Keeler PA-C Entered By: Army Melia on 01/09/2019 16:08:37 [place], [name] ([id number]) -------------------------------------------------------------------------------- HPI Details Patient Name: Sandra Pair B. Date of Service: 01/09/2019 3:30 PM Medical Record Number: [id number] Patient Account Number: [id number] Date of Birth/Sex: 1921/05/24 (85 y.o. F) Treating RN: Army Melia Primary Care Provider: [name] Other Clinician: Referring Provider: [name] Treating Provider/Extender: Melburn Hake, HOYT Weeks in Treatment: 4 History of Present Illness HPI Description: 85 year old female who developed a left anterior leg skin tear more than a month ago with a flap on it, this first started out when family were helping her put on a pair of pants and the edge of the pant leg scraped and pulled off in the area of skin on the left anterior shin. She was seen at an urgent care for the skin tear where the used a scissors to cut the loose lying skin flap on top of the wound. To give her course of antibiotics which I believe was for 10 days that she completed almost a month ago. Since then the wound had been scabbed but she experienced a lot of pain in this area family has been using Vaseline on this area after using Neosporin for some time and she is referred to the clinic for further care of the wound with scab on it. Patient denies any fevers chills shakes. She is ambulatory and has family assist her when needed. Patient is at her baseline in terms of her activity level and functional status other than experiencing some pain in the left leg around the scab. ABIs were not obtained today on the  left on account of significant pain associated with the scabbing wound Patient has history of breast cancer status post mastectomy in remission, colon cancer status post colectomy in remission, iron deficiency for which  she gets iron infusions, hypertension, she is not on any blood thinners 12/19/18 on evaluation today patient actually appears to be redoing okay in regard to the skin tear on the left anterior lower extremity. With that being said this is still showing signs of a significant amount of necrotic tissue on the surface of the wound unfortunately. She's been "trying to let this scab" which obviously is not the right thing at this point. I do think that cental in keeping this area covered is gonna be the ideal way to get this to heal most appropriately. Obviously I explained to the patient as well as her son who was present during the evaluation today that we really need to try and get the necrotic tissue off of the surface of the wound in order to let this heal appropriately. Fortunately there's no signs of active infection. 12/26/2018 upon evaluation today patient appears to be doing better with regard to her left lower extremity ulcer. She has been tolerating the dressing changes specifically with the Santyl without complication. Fortunately there is no signs of active infection at this time. No fevers, chills, nausea, vomiting, or diarrhea. The dry dark eschar is loosening up and does seem to be doing much better today which is great news. Overall I am pleased with the progress. 01/02/19 on evaluation today patient actually appears to be doing excellent in regard to her lower extremity ulcer. She's been tolerating the dressing changes without complication. Fortunately the slough is loosening up and I do believe that the Santyl has been very beneficial in this regard. With that being said I did take almost a week for them to get the medication from the pharmacy [facility] seems to take forever  to get this in stock. Other than that I'm overall pleased with how things again are appearing today. 01/09/2019 upon evaluation today patient appears to be doing quite well with regard to her left anterior lower extremity ulcer. She has been tolerating the dressing changes without complication. Fortunately there is no signs of active infection at this time. She does have some slough noted on the surface of the wound but overall this appears to be doing much better in my opinion. Electronic Signature(s) Signed: 01/09/2019 6:03:14 PM By: Worthy Keeler PA-C Entered By: Worthy Keeler on 01/09/2019 18:03:14 Real, [name] ([id number]) -------------------------------------------------------------------------------- Physical Exam Details Patient Name: Weeks, [name] Date of Service: 01/09/2019 3:30 PM Medical Record Number: [id number] Patient Account Number: [id number] Date of Birth/Sex: Oct 26, 1920 (85 y.o. F) Treating RN: Army Melia Primary Care Provider: [name] Other Clinician: Referring Provider: [name] Treating Provider/Extender: Melburn Hake, HOYT Weeks in Treatment: 4 Constitutional Well-nourished and well-hydrated in no acute distress. Respiratory normal breathing without difficulty. Psychiatric this patient is able to make decisions and demonstrates good insight into disease process. Alert and Oriented x 3. pleasant and cooperative. Notes Patient's wound bed did require some sharp debridement today to remove slough from the surface of the wound. She tolerated this without complication post debridement the wound bed appears to be doing significantly better which is great news in fact I think 1 more week with the Santyl and may be able to switch to a different dressing potentially Hydrofera Blue or collagen just depending on how things appear. Electronic Signature(s) Signed: 01/09/2019 6:03:41 PM By: Worthy Keeler PA-C Entered By: Worthy Keeler on 01/09/2019  18:03:41 Pete, [name] ([id number]) -------------------------------------------------------------------------------- Physician Orders Details Patient Name: Sandra Pair B. Date of Service: 01/09/2019 3:30 PM Medical  Record Number: [id number] Patient Account Number: [id number] Date of Birth/Sex: February 05, 1921 (85 y.o. F) Treating RN: Army Melia Primary Care Provider: [name] Other Clinician: Referring Provider: [name] Treating Provider/Extender: Melburn Hake, HOYT Weeks in Treatment: 4 Verbal / Phone Orders: No Diagnosis Coding ICD-10 Coding Code Description I87.2 Venous insufficiency (chronic) (peripheral) L97.822 Non-pressure chronic ulcer of other part of left lower leg with fat layer exposed Wound Cleansing Wound #1 Left,Anterior Lower Leg o Dial antibacterial soap, wash wounds, rinse and pat dry prior to dressing wounds o May Shower, gently pat wound dry prior to applying new dressing. Primary Wound Dressing Wound #1 Left,Anterior Lower Leg o Santyl Ointment Secondary Dressing Wound #1 Left,Anterior Lower Leg o Non-adherent pad - Secured with conform an tape. Dressing Change Frequency Wound #1 Left,Anterior Lower Leg o Change dressing every day. Follow-up Appointments o Return Appointment in 1 week. Edema Control Wound #1 Left,Anterior Lower Leg o Other: - Tubigrip G Electronic Signature(s) Signed: 01/09/2019 4:39:13 PM By: Army Melia Signed: 01/09/2019 10:42:11 PM By: Worthy Keeler PA-C Entered By: Army Melia on 01/09/2019 16:09:09 [place], [name] ([id number]) -------------------------------------------------------------------------------- Problem List Details Patient Name: Weeks, [name] Date of Service: 01/09/2019 3:30 PM Medical Record Number: [id number] Patient Account Number: [id number] Date of Birth/Sex: [birth year] (85 y.o. F) Treating RN: Army Melia Primary Care Provider: [name] Other Clinician: Referring Provider:  [name] Treating Provider/Extender: Melburn Hake, HOYT Weeks in Treatment: 4 Active Problems ICD-10 Evaluated Encounter Code Description Active Date Today Diagnosis I87.2 Venous insufficiency (chronic) (peripheral) 12/10/2018 No Yes L97.822 Non-pressure chronic ulcer of other part of left lower leg with 12/10/2018 No Yes fat layer exposed Inactive Problems Resolved Problems Electronic Signature(s) Signed: 01/09/2019 3:55:42 PM By: Worthy Keeler PA-C Entered By: Worthy Keeler on 01/09/2019 15:55:41 [place], [name] ([id number]) -------------------------------------------------------------------------------- Progress Note Details Patient Name: Weeks, [name] Date of Service: 01/09/2019 3:30 PM Medical Record Number: [id number] Patient Account Number: [id number] Date of Birth/Sex: 01/17/1921 (85 y.o. F) Treating RN: Army Melia Primary Care Provider: [name] Other Clinician: Referring Provider: [name] Treating Provider/Extender: Melburn Hake, HOYT Weeks in Treatment: 4 Subjective Chief Complaint Information obtained from Patient Left leg wound, referred by her Surgeon History of Present Illness (HPI) 85 year old female who developed a left anterior leg skin tear more than a month ago with a flap on it, this first started out when family were helping her put on a pair of pants and the edge of the pant leg scraped and pulled off in the area of skin on the left anterior shin. She was seen at an urgent care for the skin tear where the used a scissors to cut the loose lying skin flap on top of the wound. To give her course of antibiotics which I believe was for 10 days that she completed almost a month ago. Since then the wound had been scabbed but she experienced a lot of pain in this area family has been using Vaseline on this area after using Neosporin for some time and she is referred to the clinic for further care of the wound with scab on it. Patient denies any  fevers chills shakes. She is ambulatory and has family assist her when needed. Patient is at her baseline in terms of her activity level and functional status other than experiencing some pain in the left leg around the scab. ABIs were not obtained today on the left on account of significant pain associated with the scabbing wound Patient has history of  breast cancer status post mastectomy in remission, colon cancer status post colectomy in remission, iron deficiency for which she gets iron infusions, hypertension, she is not on any blood thinners 12/19/18 on evaluation today patient actually appears to be redoing okay in regard to the skin tear on the left anterior lower extremity. With that being said this is still showing signs of a significant amount of necrotic tissue on the surface of the wound unfortunately. She's been "trying to let this scab" which obviously is not the right thing at this point. I do think that cental in keeping this area covered is gonna be the ideal way to get this to heal most appropriately. Obviously I explained to the patient as well as her son who was present during the evaluation today that we really need to try and get the necrotic tissue off of the surface of the wound in order to let this heal appropriately. Fortunately there's no signs of active infection. 12/26/2018 upon evaluation today patient appears to be doing better with regard to her left lower extremity ulcer. She has been tolerating the dressing changes specifically with the Santyl without complication. Fortunately there is no signs of active infection at this time. No fevers, chills, nausea, vomiting, or diarrhea. The dry dark eschar is loosening up and does seem to be doing much better today which is great news. Overall I am pleased with the progress. 01/02/19 on evaluation today patient actually appears to be doing excellent in regard to her lower extremity ulcer. She's been tolerating the dressing  changes without complication. Fortunately the slough is loosening up and I do believe that the Santyl has been very beneficial in this regard. With that being said I did take almost a week for them to get the medication from the pharmacy [facility] seems to take forever to get this in stock. Other than that I'm overall pleased with how things again are appearing today. 01/09/2019 upon evaluation today patient appears to be doing quite well with regard to her left anterior lower extremity ulcer. She has been tolerating the dressing changes without complication. Fortunately there is no signs of active infection at this time. She does have some slough noted on the surface of the wound but overall this appears to be doing much better in my opinion. Patient History Information obtained from Patient. Family History Weeks, [name]. ([id number]) [place], No family history of Diabetes, Heart Disease, Hereditary Spherocytosis, Hypertension, Kidney Disease, Lung Disease, Seizures, Stroke, Thyroid Problems, Tuberculosis. Social History Never smoker, Marital Status - Widowed, Alcohol Use - Never, Drug Use - No History, Caffeine Use - Daily - coffee. Medical History Eyes Patient has history of Cataracts - both Hematologic/Lymphatic Denies history of Anemia, Hemophilia, Human Immunodeficiency Virus, Lymphedema, Sickle Cell Disease Respiratory Denies history of Aspiration, Asthma, Chronic Obstructive Pulmonary Disease (COPD), Pneumothorax, Sleep Apnea, Tuberculosis Cardiovascular Patient has history of Hypertension Denies history of Angina, Arrhythmia, Congestive Heart Failure, Coronary Artery Disease, Deep Vein Thrombosis, Hypotension, Myocardial Infarction, Peripheral Arterial Disease, Peripheral Venous Disease, Phlebitis, Vasculitis Gastrointestinal Denies history of Cirrhosis , Colitis, Crohn s, Hepatitis A, Hepatitis B, Hepatitis C Endocrine Denies history of Type I  Diabetes, Type II Diabetes Genitourinary Denies history of End Stage Renal Disease Immunological Denies history of Lupus Erythematosus, Raynaud s, Scleroderma Integumentary (Skin) Denies history of History of Burn, History of pressure wounds Musculoskeletal Denies history of Gout, Rheumatoid Arthritis, Osteoarthritis Neurologic Denies history of Dementia, Neuropathy, Quadriplegia, Paraplegia, Seizure Disorder Oncologic Denies history of Received Chemotherapy  Psychiatric Denies history of Anorexia/bulimia, Confinement Anxiety Medical And Surgical History Notes Gastrointestinal ulcer issues Review of Systems (ROS) Constitutional Symptoms (General Health) Denies complaints or symptoms of Fatigue, Fever, Chills, Marked Weight Change. Respiratory Denies complaints or symptoms of Chronic or frequent coughs, Shortness of Breath. Cardiovascular Denies complaints or symptoms of Chest pain, LE edema. Psychiatric Denies complaints or symptoms of Anxiety, Claustrophobia. Weeks, [name] ([id number]) Objective Constitutional Well-nourished and well-hydrated in no acute distress. Vitals Time Taken: 3:49 PM, Height: 64 in, Weight: 148 lbs, BMI: 25.4, Temperature: 98.0 F, Pulse: 65 bpm, Respiratory Rate: 16 breaths/min, Blood Pressure: 108/74 mmHg. Respiratory normal breathing without difficulty. Psychiatric this patient is able to make decisions and demonstrates good insight into disease process. Alert and Oriented x 3. pleasant and cooperative. General Notes: Patient's wound bed did require some sharp debridement today to remove slough from the surface of the wound. She tolerated this without complication post debridement the wound bed appears to be doing significantly better which is great news in fact I think 1 more week with the Santyl and may be able to switch to a different dressing potentially Hydrofera Blue or collagen just depending on how things appear. Integumentary (Hair,  Skin) Wound #1 status is Open. Original cause of wound was Other Lesion. The wound is located on the Left,Anterior Lower Leg. The wound measures 5cm length x 1.9cm width x 0.1cm depth; 7.461cm^2 area and 0.746cm^3 volume. There is Fat Layer (Subcutaneous Tissue) Exposed exposed. There is no tunneling or undermining noted. There is a medium amount of serosanguineous drainage noted. The wound margin is indistinct and nonvisible. There is small (1-33%) red, hyper - granulation within the wound bed. There is a large (67-100%) amount of necrotic tissue within the wound bed including Adherent Slough. Assessment Active Problems ICD-10 Venous insufficiency (chronic) (peripheral) Non-pressure chronic ulcer of other part of left lower leg with fat layer exposed Procedures Wound #1 Pre-procedure diagnosis of Wound #1 is a Skin Tear located on the Left,Anterior Lower Leg . There was a Excisional Skin/Subcutaneous Tissue Debridement with a total area of 9.5 sq cm performed by STONE III, HOYT E., PA-C. With the following instrument(s): Curette to remove Viable and Non-Viable tissue/material. Material removed includes Subcutaneous Tissue and Slough and after achieving pain control using Lidocaine. A time out was conducted at 16:05, prior to the start of the procedure. A Minimum amount of bleeding was controlled with Pressure. The procedure was tolerated well. Post Debridement Measurements: 5cm length x 1.9cm width x 0.1cm depth; 0.746cm^3 volume. Character of Wound/Ulcer Post Debridement is stable. Post procedure Diagnosis Wound #1: Same as Pre-Procedure Weeks, [name] ([id number]) Plan Wound Cleansing: Wound #1 Left,Anterior Lower Leg: Dial antibacterial soap, wash wounds, rinse and pat dry prior to dressing wounds May Shower, gently pat wound dry prior to applying new dressing. Primary Wound Dressing: Wound #1 Left,Anterior Lower Leg: Santyl Ointment Secondary Dressing: Wound #1 Left,Anterior  Lower Leg: Non-adherent pad - Secured with conform an tape. Dressing Change Frequency: Wound #1 Left,Anterior Lower Leg: Change dressing every day. Follow-up Appointments: Return Appointment in 1 week. Edema Control: Wound #1 Left,Anterior Lower Leg: Other: - Tubigrip G 1. I would recommend that we continue with the Santyl for the time being. 2. Also suggest that we continue with the Tubigrip since this seems to be doing well for the patient she is keeping the area covered which is excellent news. 3. I recommend the patient continue to elevate her legs as much as possible. We will see  patient back for reevaluation in 1 week here in the clinic. If anything worsens or changes patient will contact our office for additional recommendations. Electronic Signature(s) Signed: 01/09/2019 6:04:12 PM By: Worthy Keeler PA-C Entered By: Worthy Keeler on 01/09/2019 18:04:12 Weeks, [name] ([id number]) -------------------------------------------------------------------------------- ROS/PFSH Details Patient Name: Sandra Pair B. Date of Service: 01/09/2019 3:30 PM Medical Record Number: [id number] Patient Account Number: [id number] Date of Birth/Sex: [birth year] (85 y.o. F) Treating RN: Army Melia Primary Care Provider: [name] Other Clinician: Referring Provider: [name] Treating Provider/Extender: Melburn Hake, HOYT Weeks in Treatment: 4 Information Obtained From Patient Constitutional Symptoms (General Health) Complaints and Symptoms: Negative for: Fatigue; Fever; Chills; Marked Weight Change Respiratory Complaints and Symptoms: Negative for: Chronic or frequent coughs; Shortness of Breath Medical History: Negative for: Aspiration; Asthma; Chronic Obstructive Pulmonary Disease (COPD); Pneumothorax; Sleep Apnea; Tuberculosis Cardiovascular Complaints and Symptoms: Negative for: Chest pain; LE edema Medical History: Positive for: Hypertension Negative for: Angina;  Arrhythmia; Congestive Heart Failure; Coronary Artery Disease; Deep Vein Thrombosis; Hypotension; Myocardial Infarction; Peripheral Arterial Disease; Peripheral Venous Disease; Phlebitis; Vasculitis Psychiatric Complaints and Symptoms: Negative for: Anxiety; Claustrophobia Medical History: Negative for: Anorexia/bulimia; Confinement Anxiety Eyes Medical History: Positive for: Cataracts - both Hematologic/Lymphatic Medical History: Negative for: Anemia; Hemophilia; Human Immunodeficiency Virus; Lymphedema; Sickle Cell Disease Gastrointestinal Medical History: Negative for: Cirrhosis ; Colitis; Crohnos; Hepatitis A; Hepatitis B; Hepatitis C Past Medical History Notes: [name], [name] ([id number]) ulcer issues Endocrine Medical History: Negative for: Type I Diabetes; Type II Diabetes Genitourinary Medical History: Negative for: End Stage Renal Disease Immunological Medical History: Negative for: Lupus Erythematosus; Raynaudos; Scleroderma Integumentary (Skin) Medical History: Negative for: History of Burn; History of pressure wounds Musculoskeletal Medical History: Negative for: Gout; Rheumatoid Arthritis; Osteoarthritis Neurologic Medical History: Negative for: Dementia; Neuropathy; Quadriplegia; Paraplegia; Seizure Disorder Oncologic Medical History: Negative for: Received Chemotherapy HBO Extended History Items Eyes: Cataracts Immunizations Pneumococcal Vaccine: Received Pneumococcal Vaccination: Yes Implantable Devices None Family and Social History Cancer: Yes - Father,Mother,Siblings; Diabetes: No; Heart Disease: No; Hereditary Spherocytosis: No; Hypertension: No; Kidney Disease: No; Lung Disease: No; Seizures: No; Stroke: No; Thyroid Problems: No; Tuberculosis: No; Never smoker; Marital Status - Widowed; Alcohol Use: Never; Drug Use: No History; Caffeine Use: Daily - coffee; Financial Concerns: No; Food, Clothing or Shelter Needs: No; Support System Lacking:  No; Transportation Concerns: No Physician Affirmation I have reviewed and agree with the above information. Electronic Signature(s) [name], MCELHANEY ([id number]) Signed: 01/09/2019 10:42:11 PM By: Worthy Keeler PA-C Signed: 01/10/2019 3:55:59 PM By: Army Melia Entered By: Worthy Keeler on 01/09/2019 18:03:29 [place], [name] ([id number]) -------------------------------------------------------------------------------- SuperBill Details Patient Name: Sandra Pair B. Date of Service: 01/09/2019 Medical Record Number: [id number] Patient Account Number: [id number] Date of Birth/Sex: 11-Jan-1921 (85 y.o. 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

## 2019-01-10 NOTE — Progress Notes (Signed)
Sandra, Weeks ([id number]) Visit Report for 01/09/2019 Arrival Information Details Patient Name: [name], [name] Date of Service: 01/09/2019 3:30 PM Medical Record Number: [id number] Patient Account Number: [id number] Date of Birth/Sex: [birth year] (85 y.o. F) Treating RN: Cornell Barman Primary Care Rakiya Krawczyk: [name] Other Clinician: Referring Smitty Ackerley: [name] Treating Jiyan Walkowski/Extender: Melburn Hake, HOYT Weeks in Treatment: 4 Visit Information History Since Last Visit Added or deleted any medications: No Patient Arrived: [name] Any new allergies or adverse reactions: No Arrival Time: 15:48 Had a fall or experienced change in No Accompanied By: daughter activities of daily living that may affect Transfer Assistance: Manual risk of falls: Patient Identification Verified: Yes Signs or symptoms of abuse/neglect since last visito No Secondary Verification Process Completed: Yes Hospitalized since last visit: No Patient Requires Transmission-Based Precautions: No Implantable device outside of the clinic excluding No Patient Has Alerts: No cellular tissue based products placed in the center since last visit: Has Dressing in Place as Prescribed: Yes Pain Present Now: No Electronic Signature(s) Signed: 01/09/2019 5:15:24 PM By: Gretta Cool, BSN, RN, CWS, Kim RN, BSN Entered By: Gretta Cool, BSN, RN, CWS, Kim on 01/09/2019 15:49:50 [name], [name] -------------------------------------------------------------------------------- Encounter Discharge Information Details Patient Name: Sandra Pair B. Date of Service: 01/09/2019 3:30 PM Medical Record Number: [id number] Patient Account Number: [id number] Date of Birth/Sex: 06/03/1920 (85 y.o. F) Treating RN: Army Melia Primary Care Kathrin Folden: [name] Other Clinician: Referring Jasmeen Fritsch: [name] Treating Jemuel Laursen/Extender: Melburn Hake, HOYT Weeks in Treatment: 4 Encounter Discharge Information Items Post Procedure  Vitals Discharge Condition: Stable Temperature (F): 98.0 Ambulatory Status: Ambulatory Pulse (bpm): 65 Discharge Destination: Home Respiratory Rate (breaths/min): 16 Transportation: Private Auto Blood Pressure (mmHg): 108/74 Accompanied By: daughter Schedule Follow-up Appointment: Yes Clinical Summary of Care: Electronic Signature(s) Signed: 01/09/2019 4:39:13 PM By: Army Melia Entered By: Army Melia on 01/09/2019 16:11:25 [name], [name] -------------------------------------------------------------------------------- Lower Extremity Assessment Details Patient Name: Weeks, [name] Date of Service: 01/09/2019 3:30 PM Medical Record Number: [id number] Patient Account Number: [id number] Date of Birth/Sex: 06-Jun-1920 (85 y.o. F) Treating RN: Cornell Barman Primary Care Pesach Frisch: [name] Other Clinician: Referring Danyal Adorno: [name] Treating Orianna Biskup/Extender: Melburn Hake, HOYT Weeks in Treatment: 4 Vascular Assessment Pulses: Dorsalis Pedis Palpable: [Left:Yes] Posterior Tibial Palpable: [Left:Yes] Electronic Signature(s) Signed: 01/09/2019 5:15:24 PM By: Gretta Cool, BSN, RN, CWS, Kim RN, BSN Entered By: Gretta Cool, BSN, RN, CWS, Kim on 01/09/2019 15:54:13 [name], [name] -------------------------------------------------------------------------------- Multi Wound Chart Details Patient Name: Sandra Pair B. Date of Service: 01/09/2019 3:30 PM Medical Record Number: [id number] Patient Account Number: [id number] Date of Birth/Sex: [birth year] (85 y.o. F) Treating RN: Army Melia Primary Care Krystale Rinkenberger: [name] Other Clinician: Referring Shemika Robbs: [name] Treating Effie Wahlert/Extender: Melburn Hake, HOYT Weeks in Treatment: 4 Vital Signs Height(in): 64 Pulse(bpm): 65 Weight(lbs): 148 Blood Pressure(mmHg): 108/74 Body Mass Index(BMI): 25 Temperature(F): 98.0 Respiratory Rate 16 (breaths/min): Photos: [N/A:N/A] Wound Location: Left  Lower Leg - Anterior N/A N/A Wounding Event: Other Lesion N/A N/A Primary Etiology: Skin Tear N/A N/A Comorbid History: Cataracts, Hypertension N/A N/A Date Acquired: 11/06/2018 N/A N/A Weeks of Treatment: 4 N/A N/A Wound Status: Open N/A N/A Measurements L x W x D 5x1.9x0.1 N/A N/A (cm) Area (cm) : 7.461 N/A N/A Volume (cm) : 0.746 N/A N/A % Reduction in Area: 56.00% N/A N/A % Reduction in Volume: 56.00% N/A N/A Classification: Full Thickness Without N/A N/A Exposed Support Structures Exudate Amount: Medium N/A N/A Exudate Type: Serosanguineous N/A N/A Exudate Color: red, brown N/A N/A Wound Margin: Indistinct,  nonvisible N/A N/A Granulation Amount: Small (1-33%) N/A N/A Granulation Quality: Red, Hyper-granulation N/A N/A Necrotic Amount: Large (67-100%) N/A N/A Exposed Structures: Fat Layer (Subcutaneous N/A N/A Tissue) Exposed: Yes Fascia: No Tendon: No Muscle: No Joint: No Bone: No Weeks, [name] Epithelialization: None N/A N/A Treatment Notes Electronic Signature(s) Signed: 01/09/2019 4:39:13 PM By: Army Melia Entered By: Army Melia on 01/09/2019 16:07:03 [name], [name] -------------------------------------------------------------------------------- [place] Details Patient Name: Sandra Pair B. Date of Service: 01/09/2019 3:30 PM Medical Record Number: [id number] Patient Account Number: [id number] Date of Birth/Sex: 1920-12-06 (85 y.o. F) Treating RN: Army Melia Primary Care Audrick Lamoureaux: [name] Other Clinician: Referring Shrihaan Porzio: [name] Treating Kearsten Ginther/Extender: Melburn Hake, HOYT Weeks in Treatment: 4 Active Inactive Necrotic Tissue Nursing Diagnoses: Impaired tissue integrity related to necrotic/devitalized tissue Goals: Necrotic/devitalized tissue will be minimized in the wound bed Date Initiated: 12/19/2018 Target Resolution Date: 12/25/2018 Goal Status: Active Interventions: Assess  patient pain level pre-, during and post procedure and prior to discharge Treatment Activities: Enzymatic debridement : 12/19/2018 Notes: Orientation to the Wound Care Program Nursing Diagnoses: Knowledge deficit related to the wound healing center program Goals: Patient/caregiver will verbalize understanding of the [place] Date Initiated: 12/19/2018 Target Resolution Date: 12/25/2018 Goal Status: Active Interventions: Provide education on orientation to the wound center Notes: Pain, Acute or Chronic Nursing Diagnoses: Potential alteration in comfort, pain Goals: Patient will verbalize adequate pain control and receive pain control interventions during procedures as needed Date Initiated: 12/19/2018 Target Resolution Date: 12/25/2018 Goal Status: Active Sandra, STANTZ ([id number]) Interventions: Provide education on pain management Notes: Wound/Skin Impairment Nursing Diagnoses: Impaired tissue integrity Goals: Patient/caregiver will verbalize understanding of skin care regimen Date Initiated: 12/19/2018 Target Resolution Date: 12/25/2018 Goal Status: Active Interventions: Assess ulceration(s) every visit Treatment Activities: Skin care regimen initiated : 12/19/2018 Notes: Electronic Signature(s) Signed: 01/09/2019 4:39:13 PM By: Army Melia Entered By: Army Melia on 01/09/2019 16:06:54 [name], [name] Kitchen ([id number]) -------------------------------------------------------------------------------- Pain Assessment Details Patient Name: Sandra Pair B. Date of Service: 01/09/2019 3:30 PM Medical Record Number: [id number] Patient Account Number: [id number] Date of Birth/Sex: July 02, 1920 (85 y.o. F) Treating RN: Cornell Barman Primary Care Micaiah Remillard: [name] Other Clinician: Referring Ardena Gangl: [name] Treating Leonce Bale/Extender: Melburn Hake, HOYT Weeks in Treatment: 4 Active Problems Location of Pain Severity and Description of Pain Patient Has  Paino No Site Locations Pain Management and Medication Current Pain Management: Electronic Signature(s) Signed: 01/09/2019 5:15:24 PM By: Gretta Cool, BSN, RN, CWS, Kim RN, BSN Entered By: Gretta Cool, BSN, RN, CWS, Kim on 01/09/2019 15:49:57 Lie, [name] ([id number]) -------------------------------------------------------------------------------- Patient/Caregiver Education Details Patient Name: Elesa Massed. Date of Service: 01/09/2019 3:30 PM Medical Record Number: [id number] Patient Account Number: [id number] Date of Birth/Gender: 17-Sep-1920 (85 y.o. F) Treating RN: Army Melia Primary Care Physician: [name] Other Clinician: Referring Physician: [name] Treating Physician/Extender: Melburn Hake, HOYT Weeks in Treatment: 4 Education Assessment Education Provided To: Patient Education Topics Provided Wound/Skin Impairment: Handouts: Caring for Your Ulcer Methods: Demonstration, Explain/Verbal Responses: State content correctly Electronic Signature(s) Signed: 01/09/2019 4:39:13 PM By: Army Melia Entered By: Army Melia on 01/09/2019 16:09:59 Helmer, [name] ([id number]) -------------------------------------------------------------------------------- Wound Assessment Details Patient Name: Busic, Kassadie B. Date of Service: 01/09/2019 3:30 PM Medical Record Number: [id number] Patient Account Number: [id number] Date of Birth/Sex: [birth year] (85 y.o. F) Treating RN: Cornell Barman Primary Care Kimberl Vig: [name] Other Clinician: Referring Shadoe Bethel: [name] Treating Rajean Desantiago/Extender: Melburn Hake, HOYT Weeks in Treatment: 4 Wound Status Wound Number: 1 Primary  Etiology: Skin Tear Wound Location: Left Lower Leg - Anterior Wound Status: Open Wounding Event: Other Lesion Comorbid History: Cataracts, Hypertension Date Acquired: 11/06/2018 Weeks Of Treatment: 4 Clustered Wound: No Photos Wound Measurements Length: (cm) 5 Width: (cm) 1.9 Depth: (cm) 0.1 Area:  (cm) 7.461 Volume: (cm) 0.746 % Reduction in Area: 56% % Reduction in Volume: 56% Epithelialization: None Tunneling: No Undermining: No Wound Description Full Thickness Without Exposed Support Classification: Structures Wound Margin: Indistinct, nonvisible Exudate Medium Amount: Exudate Type: Serosanguineous Exudate Color: red, brown Foul Odor After Cleansing: No Slough/Fibrino Yes Wound Bed Granulation Amount: Small (1-33%) Exposed Structure Granulation Quality: Red, Hyper-granulation Fascia Exposed: No Necrotic Amount: Large (67-100%) Fat Layer (Subcutaneous Tissue) Exposed: Yes Necrotic Quality: Adherent Slough Tendon Exposed: No Muscle Exposed: No Joint Exposed: No Bone Exposed: No Weeks, [name] ([id number]) Treatment Notes Wound #1 (Left, Anterior Lower Leg) Notes santyl, telfa and conform, tubigrip G Electronic Signature(s) Signed: 01/09/2019 5:15:24 PM By: Gretta Cool, BSN, RN, CWS, Kim RN, BSN Entered By: Gretta Cool, BSN, RN, CWS, Kim on 01/09/2019 15:52:56 Sullivant, [name] ([id number]) -------------------------------------------------------------------------------- Vitals Details Patient Name: Sandra Pair B. Date of Service: 01/09/2019 3:30 PM Medical Record Number: [id number] Patient Account Number: [id number] Date of Birth/Sex: 04-15-1921 (85 y.o. F) Treating RN: Cornell Barman Primary Care Italo Banton: [name] Other Clinician: Referring Rosaland Shiffman: [name] Treating Jackob Crookston/Extender: Melburn Hake, HOYT Weeks in Treatment: 4 Vital Signs Time Taken: 15:49 Temperature (F): 98.0 Height (in): 64 Pulse (bpm): 65 Weight (lbs): 148 Respiratory Rate (breaths/min): 16 Body Mass Index (BMI): 25.4 Blood Pressure (mmHg): 108/74 Reference Range: 80 - 120 mg / dl Electronic Signature(s) Signed: 01/09/2019 5:15:24 PM By: Gretta Cool, BSN, RN, CWS, Kim RN, BSN Entered By: Gretta Cool, BSN, RN, CWS, Kim on 01/09/2019 15:51:07

## 2019-01-16 ENCOUNTER — Other Ambulatory Visit: Payer: Self-pay

## 2019-01-16 ENCOUNTER — Encounter: Payer: Medicare Other | Admitting: Physician Assistant

## 2019-01-16 DIAGNOSIS — S81812A Laceration without foreign body, left lower leg, initial encounter: Secondary | ICD-10-CM | POA: Diagnosis not present

## 2019-01-16 NOTE — Progress Notes (Addendum)
[name] ([id number]) Visit Report for 01/16/2019 Chief Complaint Document Details Patient Name: Weeks, [name] Date of Service: 01/16/2019 3:30 PM Medical Record Number: [id number] Patient Account Number: [id number] Date of Birth/Sex: 1921/03/03 (85 y.o. F) Treating RN: Army Melia Primary Care Provider: [name] Other Clinician: Referring Provider: [name] Treating Provider/Extender: Melburn Hake, HOYT Weeks in Treatment: 5 Information Obtained from: Patient Chief Complaint Left leg wound, referred by her Surgeon Electronic Signature(s) Signed: 01/16/2019 3:36:53 PM By: Worthy Keeler PA-C Entered By: Worthy Keeler on 01/16/2019 15:36:53 [name], [name] ([id number]) -------------------------------------------------------------------------------- Debridement Details Patient Name: Sandra Pair B. Date of Service: 01/16/2019 3:30 PM Medical Record Number: [id number] Patient Account Number: [id number] Date of Birth/Sex: 04/02/1921 (85 y.o. F) Treating RN: Army Melia Primary Care Provider: [name] Other Clinician: Referring Provider: [name] Treating Provider/Extender: Melburn Hake, HOYT Weeks in Treatment: 5 Debridement Performed for Wound #1 Left,Anterior Lower Leg Assessment: Performed By: Physician STONE III, HOYT E., PA-C Debridement Type: Debridement Level of Consciousness (Pre- Awake and Alert procedure): Pre-procedure Verification/Time Yes - 16:05 Out Taken: Start Time: 16:05 Pain Control: Lidocaine Total Area Debrided (L x W): 4.5 (cm) x 1.6 (cm) = 7.2 (cm) Tissue and other material Viable, Non-Viable, Slough, Subcutaneous, Slough debrided: Level: Skin/Subcutaneous Tissue Debridement Description: Excisional Instrument: Curette Bleeding: Minimum Hemostasis Achieved: Pressure End Time: 16:07 Response to Treatment: Procedure was tolerated well Level of Consciousness Awake and Alert (Post-procedure): Post Debridement  Measurements of Total Wound Length: (cm) 4.5 Width: (cm) 1.6 Depth: (cm) 0.2 Volume: (cm) 1.131 Character of Wound/Ulcer Post Debridement: Stable Post Procedure Diagnosis Same as Pre-procedure Electronic Signature(s) Signed: 01/16/2019 4:30:40 PM By: Army Melia Signed: 01/16/2019 5:39:21 PM By: Worthy Keeler PA-C Entered By: Army Melia on 01/16/2019 16:09:16 [name] ([id number]) -------------------------------------------------------------------------------- HPI Details Patient Name: Sandra Pair B. Date of Service: 01/16/2019 3:30 PM Medical Record Number: [id number] Patient Account Number: [id number] Date of Birth/Sex: 1920-08-01 (85 y.o. F) Treating RN: Army Melia Primary Care Provider: [name] Other Clinician: Referring Provider: [name] Treating Provider/Extender: Melburn Hake, HOYT Weeks in Treatment: 5 History of Present Illness HPI Description: 85 year old female who developed a left anterior leg skin tear more than a month ago with a flap on it, this first started out when family were helping her put on a pair of pants and the edge of the pant leg scraped and pulled off in the area of skin on the left anterior shin. She was seen at an urgent care for the skin tear where the used a scissors to cut the loose lying skin flap on top of the wound. To give her course of antibiotics which I believe was for 10 days that she completed almost a month ago. Since then the wound had been scabbed but she experienced a lot of pain in this area family has been using Vaseline on this area after using Neosporin for some time and she is referred to the clinic for further care of the wound with scab on it. Patient denies any fevers chills shakes. She is ambulatory and has family assist her when needed. Patient is at her baseline in terms of her activity level and functional status other than experiencing some pain in the left leg around the scab. ABIs were not obtained  today on the left on account of significant pain associated with the scabbing wound Patient has history of breast cancer status post mastectomy in remission, colon cancer status post colectomy in remission, iron deficiency for which  she gets iron infusions, hypertension, she is not on any blood thinners 12/19/18 on evaluation today patient actually appears to be redoing okay in regard to the skin tear on the left anterior lower extremity. With that being said this is still showing signs of a significant amount of necrotic tissue on the surface of the wound unfortunately. She's been "trying to let this scab" which obviously is not the right thing at this point. I do think that cental in keeping this area covered is gonna be the ideal way to get this to heal most appropriately. Obviously I explained to the patient as well as her son who was present during the evaluation today that we really need to try and get the necrotic tissue off of the surface of the wound in order to let this heal appropriately. Fortunately there's no signs of active infection. 12/26/2018 upon evaluation today patient appears to be doing better with regard to her left lower extremity ulcer. She has been tolerating the dressing changes specifically with the Santyl without complication. Fortunately there is no signs of active infection at this time. No fevers, chills, nausea, vomiting, or diarrhea. The dry dark eschar is loosening up and does seem to be doing much better today which is great news. Overall I am pleased with the progress. 01/02/19 on evaluation today patient actually appears to be doing excellent in regard to her lower extremity ulcer. She's been tolerating the dressing changes without complication. Fortunately the slough is loosening up and I do believe that the Santyl has been very beneficial in this regard. With that being said I did take almost a week for them to get the medication from the pharmacy [facility] seems to  take forever to get this in stock. Other than that I'm overall pleased with how things again are appearing today. 01/09/2019 upon evaluation today patient appears to be doing quite well with regard to her left anterior lower extremity ulcer. She has been tolerating the dressing changes without complication. Fortunately there is no signs of active infection at this time. She does have some slough noted on the surface of the wound but overall this appears to be doing much better in my opinion. 01/16/2019 on evaluation today patient appears to be doing well with regard to her left lower extremity ulcer. She has been tolerating the dressing changes without complication and very pleased with the progress that she seems to be making. Fortunately there is no signs of active infection at this time. I do believe it may be time to switch to a different dressing I am thinking a Hydrofera Blue dressing would be beneficial. Electronic Signature(s) Signed: 01/16/2019 5:23:06 PM By: Worthy Keeler PA-C Entered By: Worthy Keeler on 01/16/2019 17:23:06 Pell, [name] ([id number]) Yung, [name] ([id number]) -------------------------------------------------------------------------------- Physical Exam Details Patient Name: Paulson, [name] Date of Service: 01/16/2019 3:30 PM Medical Record Number: [id number] Patient Account Number: [id number] Date of Birth/Sex: 22-Apr-1921 (85 y.o. F) Treating RN: Army Melia Primary Care Provider: [name] Other Clinician: Referring Provider: [name] Treating Provider/Extender: Melburn Hake, HOYT Weeks in Treatment: 5 Constitutional Well-nourished and well-hydrated in no acute distress. Respiratory normal breathing without difficulty. Psychiatric this patient is able to make decisions and demonstrates good insight into disease process. Alert and Oriented x 3. pleasant and cooperative. Notes On inspection today patient's wound bed did require sharp  debridement she had some slough noted on the surface of the wound. Post debridement wound bed appears to be doing much better  which is excellent news. There does not appear to be any signs of active infection and her swelling is not too bad at this point either. Overall I am very pleased with the appearance of everything today. Electronic Signature(s) Signed: 01/16/2019 5:23:44 PM By: Worthy Keeler PA-C Entered By: Worthy Keeler on 01/16/2019 17:23:44 Derusha, [name] ([id number]) -------------------------------------------------------------------------------- Physician Orders Details Patient Name: Sandra Pair B. Date of Service: 01/16/2019 3:30 PM Medical Record Number: [id number] Patient Account Number: [id number] Date of Birth/Sex: June 29, 1920 (85 y.o. F) Treating RN: Army Melia Primary Care Provider: [name] Other Clinician: Referring Provider: [name] Treating Provider/Extender: Melburn Hake, HOYT Weeks in Treatment: 5 Verbal / Phone Orders: No Diagnosis Coding ICD-10 Coding Code Description I87.2 Venous insufficiency (chronic) (peripheral) L97.822 Non-pressure chronic ulcer of other part of left lower leg with fat layer exposed Wound Cleansing Wound #1 Left,Anterior Lower Leg o Dial antibacterial soap, wash wounds, rinse and pat dry prior to dressing wounds o May Shower, gently pat wound dry prior to applying new dressing. Primary Wound Dressing Wound #1 Left,Anterior Lower Leg o Hydrafera Blue Ready Transfer Secondary Dressing Wound #1 Left,Anterior Lower Leg o ABD and Kerlix/Conform o Non-adherent pad - Secured with conform an tape. Dressing Change Frequency Wound #1 Left,Anterior Lower Leg o Change dressing every day. Follow-up Appointments o Return Appointment in 1 week. Edema Control Wound #1 Left,Anterior Lower Leg o Other: - Tubigrip G Electronic Signature(s) Signed: 01/16/2019 4:30:40 PM By: Army Melia Signed: 01/16/2019 5:39:21  PM By: Worthy Keeler PA-C Entered By: Army Melia on 01/16/2019 16:12:19 Hew, [name] ([id number]) -------------------------------------------------------------------------------- Problem List Details Patient Name: [name], [name] Date of Service: 01/16/2019 3:30 PM Medical Record Number: [id number] Patient Account Number: [id number] Date of Birth/Sex: 10-27-1920 (85 y.o. F) Treating RN: Army Melia Primary Care Provider: [name] Other Clinician: Referring Provider: [name] Treating Provider/Extender: Melburn Hake, HOYT Weeks in Treatment: 5 Active Problems ICD-10 Evaluated Encounter Code Description Active Date Today Diagnosis I87.2 Venous insufficiency (chronic) (peripheral) 12/10/2018 No Yes L97.822 Non-pressure chronic ulcer of other part of left lower leg with 12/10/2018 No Yes fat layer exposed Inactive Problems Resolved Problems Electronic Signature(s) Signed: 01/16/2019 3:36:46 PM By: Worthy Keeler PA-C Entered By: Worthy Keeler on 01/16/2019 15:36:46 Bordley, [name] ([id number]) -------------------------------------------------------------------------------- Progress Note Details Patient Name: Cristo, [name] Date of Service: 01/16/2019 3:30 PM Medical Record Number: [id number] Patient Account Number: [id number] Date of Birth/Sex: Oct 07, 1920 (85 y.o. F) Treating RN: Army Melia Primary Care Provider: [name] Other Clinician: Referring Provider: [name] Treating Provider/Extender: Melburn Hake, HOYT Weeks in Treatment: 5 Subjective Chief Complaint Information obtained from Patient Left leg wound, referred by her Surgeon History of Present Illness (HPI) 85 year old female who developed a left anterior leg skin tear more than a month ago with a flap on it, this first started out when family were helping her put on a pair of pants and the edge of the pant leg scraped and pulled off in the area of skin on the left anterior shin. She was  seen at an urgent care for the skin tear where the used a scissors to cut the loose lying skin flap on top of the wound. To give her course of antibiotics which I believe was for 10 days that she completed almost a month ago. Since then the wound had been scabbed but she experienced a lot of pain in this area family has been using Vaseline on this area after using Neosporin for some  time and she is referred to the clinic for further care of the wound with scab on it. Patient denies any fevers chills shakes. She is ambulatory and has family assist her when needed. Patient is at her baseline in terms of her activity level and functional status other than experiencing some pain in the left leg around the scab. ABIs were not obtained today on the left on account of significant pain associated with the scabbing wound Patient has history of breast cancer status post mastectomy in remission, colon cancer status post colectomy in remission, iron deficiency for which she gets iron infusions, hypertension, she is not on any blood thinners 12/19/18 on evaluation today patient actually appears to be redoing okay in regard to the skin tear on the left anterior lower extremity. With that being said this is still showing signs of a significant amount of necrotic tissue on the surface of the wound unfortunately. She's been "trying to let this scab" which obviously is not the right thing at this point. I do think that cental in keeping this area covered is gonna be the ideal way to get this to heal most appropriately. Obviously I explained to the patient as well as her son who was present during the evaluation today that we really need to try and get the necrotic tissue off of the surface of the wound in order to let this heal appropriately. Fortunately there's no signs of active infection. 12/26/2018 upon evaluation today patient appears to be doing better with regard to her left lower extremity ulcer. She has  been tolerating the dressing changes specifically with the Santyl without complication. Fortunately there is no signs of active infection at this time. No fevers, chills, nausea, vomiting, or diarrhea. The dry dark eschar is loosening up and does seem to be doing much better today which is great news. Overall I am pleased with the progress. 01/02/19 on evaluation today patient actually appears to be doing excellent in regard to her lower extremity ulcer. She's been tolerating the dressing changes without complication. Fortunately the slough is loosening up and I do believe that the Santyl has been very beneficial in this regard. With that being said I did take almost a week for them to get the medication from the pharmacy [facility] seems to take forever to get this in stock. Other than that I'm overall pleased with how things again are appearing today. 01/09/2019 upon evaluation today patient appears to be doing quite well with regard to her left anterior lower extremity ulcer. She has been tolerating the dressing changes without complication. Fortunately there is no signs of active infection at this time. She does have some slough noted on the surface of the wound but overall this appears to be doing much better in my opinion. 01/16/2019 on evaluation today patient appears to be doing well with regard to her left lower extremity ulcer. She has been tolerating the dressing changes without complication and very pleased with the progress that she seems to be making. Fortunately there is no signs of active infection at this time. I do believe it may be time to switch to a different dressing I am thinking a Hydrofera Blue dressing would be beneficial. Printz, [name] ([id number]) Patient History Information obtained from Patient. Family History Cancer - Father,Mother,Siblings, No family history of Diabetes, Heart Disease, Hereditary Spherocytosis, Hypertension, Kidney Disease, Lung Disease, Seizures,  Stroke, Thyroid Problems, Tuberculosis. Social History Never smoker, Marital Status - Widowed, Alcohol Use - Never, Drug Use -  No History, Caffeine Use - Daily - coffee. Medical History Eyes Patient has history of Cataracts - both Hematologic/Lymphatic Denies history of Anemia, Hemophilia, Human Immunodeficiency Virus, Lymphedema, Sickle Cell Disease Respiratory Denies history of Aspiration, Asthma, Chronic Obstructive Pulmonary Disease (COPD), Pneumothorax, Sleep Apnea, Tuberculosis Cardiovascular Patient has history of Hypertension Denies history of Angina, Arrhythmia, Congestive Heart Failure, Coronary Artery Disease, Deep Vein Thrombosis, Hypotension, Myocardial Infarction, Peripheral Arterial Disease, Peripheral Venous Disease, Phlebitis, Vasculitis Gastrointestinal Denies history of Cirrhosis , Colitis, Crohn s, Hepatitis A, Hepatitis B, Hepatitis C Endocrine Denies history of Type I Diabetes, Type II Diabetes Genitourinary Denies history of End Stage Renal Disease Immunological Denies history of Lupus Erythematosus, Raynaud s, Scleroderma Integumentary (Skin) Denies history of History of Burn, History of pressure wounds Musculoskeletal Denies history of Gout, Rheumatoid Arthritis, Osteoarthritis Neurologic Denies history of Dementia, Neuropathy, Quadriplegia, Paraplegia, Seizure Disorder Oncologic Denies history of Received Chemotherapy Psychiatric Denies history of Anorexia/bulimia, Confinement Anxiety Medical And Surgical History Notes Gastrointestinal ulcer issues Review of Systems (ROS) Constitutional Symptoms (General Health) Denies complaints or symptoms of Fatigue, Fever, Chills, Marked Weight Change. Respiratory Denies complaints or symptoms of Chronic or frequent coughs, Shortness of Breath. Cardiovascular Complains or has symptoms of LE edema. Denies complaints or symptoms of Chest pain. Psychiatric Denies complaints or symptoms of Anxiety,  Claustrophobia. Klunk, [name] ([id number]) Objective Constitutional Well-nourished and well-hydrated in no acute distress. Vitals Time Taken: 3:39 PM, Height: 64 in, Weight: 148 lbs, BMI: 25.4, Temperature: 98.0 F, Pulse: 68 bpm, Respiratory Rate: 16 breaths/min, Blood Pressure: 162/71 mmHg. Respiratory normal breathing without difficulty. Psychiatric this patient is able to make decisions and demonstrates good insight into disease process. Alert and Oriented x 3. pleasant and cooperative. General Notes: On inspection today patient's wound bed did require sharp debridement she had some slough noted on the surface of the wound. Post debridement wound bed appears to be doing much better which is excellent news. There does not appear to be any signs of active infection and her swelling is not too bad at this point either. Overall I am very pleased with the appearance of everything today. Integumentary (Hair, Skin) Wound #1 status is Open. Original cause of wound was Other Lesion. The wound is located on the Left,Anterior Lower Leg. The wound measures 4.5cm length x 1.6cm width x 0.2cm depth; 5.655cm^2 area and 1.131cm^3 volume. There is Fat Layer (Subcutaneous Tissue) Exposed exposed. There is no tunneling or undermining noted. There is a medium amount of serosanguineous drainage noted. The wound margin is indistinct and nonvisible. There is large (67-100%) red, hyper - granulation within the wound bed. There is a small (1-33%) amount of necrotic tissue within the wound bed including Adherent Slough. Assessment Active Problems ICD-10 Venous insufficiency (chronic) (peripheral) Non-pressure chronic ulcer of other part of left lower leg with fat layer exposed Procedures Wound #1 Pre-procedure diagnosis of Wound #1 is a Skin Tear located on the Left,Anterior Lower Leg . There was a Excisional Skin/Subcutaneous Tissue Debridement with a total area of 7.2 sq cm performed by STONE III, HOYT  E., PA-C. With the following instrument(s): Curette to remove Viable and Non-Viable tissue/material. Material removed includes Subcutaneous [name], [name] ([id number]) Tissue and Slough and after achieving pain control using Lidocaine. A time out was conducted at 16:05, prior to the start of the procedure. A Minimum amount of bleeding was controlled with Pressure. The procedure was tolerated well. Post Debridement Measurements: 4.5cm length x 1.6cm width x 0.2cm depth; 1.131cm^3 volume. Character of Wound/Ulcer  Post Debridement is stable. Post procedure Diagnosis Wound #1: Same as Pre-Procedure Plan Wound Cleansing: Wound #1 Left,Anterior Lower Leg: Dial antibacterial soap, wash wounds, rinse and pat dry prior to dressing wounds May Shower, gently pat wound dry prior to applying new dressing. Primary Wound Dressing: Wound #1 Left,Anterior Lower Leg: Hydrafera Blue Ready Transfer Secondary Dressing: Wound #1 Left,Anterior Lower Leg: ABD and Kerlix/Conform Non-adherent pad - Secured with conform an tape. Dressing Change Frequency: Wound #1 Left,Anterior Lower Leg: Change dressing every day. Follow-up Appointments: Return Appointment in 1 week. Edema Control: Wound #1 Left,Anterior Lower Leg: Other: - Tubigrip G 1. We get a switch to a Hydrofera Blue dressing and the patient is in agreement with that plan. Hopefully she will do very well with this she is performing the dressing changes herself I think that still appropriate she typically does change this every day as she showers every day. 2. On top of this we will use Kerlix to secure everything in place and then Tubigrip. 3. I still recommend that the patient elevate her legs as much as possible to help with her swelling as well. 4. I explained to the patient very clearly again today that she needs to make sure to keep this covered pretty much at all times. I do not think she needs to have it open to air although she does seem to  want to do this quite frequently. She voiced understanding. We will see patient back for reevaluation in 1 week here in the clinic. If anything worsens or changes patient will contact our office for additional recommendations. Electronic Signature(s) Signed: 01/16/2019 5:26:46 PM By: Worthy Keeler PA-C Entered By: Worthy Keeler on 01/16/2019 17:26:46 Tweten, [name] ([id number]) -------------------------------------------------------------------------------- ROS/PFSH Details Patient Name: Sandra Pair B. Date of Service: 01/16/2019 3:30 PM Medical Record Number: [id number] Patient Account Number: [id number] Date of Birth/Sex: 1921/02/19 (85 y.o. F) Treating RN: Army Melia Primary Care Provider: [name] Other Clinician: Referring Provider: [name] Treating Provider/Extender: Melburn Hake, HOYT Weeks in Treatment: 5 Information Obtained From Patient Constitutional Symptoms (General Health) Complaints and Symptoms: Negative for: Fatigue; Fever; Chills; Marked Weight Change Respiratory Complaints and Symptoms: Negative for: Chronic or frequent coughs; Shortness of Breath Medical History: Negative for: Aspiration; Asthma; Chronic Obstructive Pulmonary Disease (COPD); Pneumothorax; Sleep Apnea; Tuberculosis Cardiovascular Complaints and Symptoms: Positive for: LE edema Negative for: Chest pain Medical History: Positive for: Hypertension Negative for: Angina; Arrhythmia; Congestive Heart Failure; Coronary Artery Disease; Deep Vein Thrombosis; Hypotension; Myocardial Infarction; Peripheral Arterial Disease; Peripheral Venous Disease; Phlebitis; Vasculitis Psychiatric Complaints and Symptoms: Negative for: Anxiety; Claustrophobia Medical History: Negative for: Anorexia/bulimia; Confinement Anxiety Eyes Medical History: Positive for: Cataracts - both Hematologic/Lymphatic Medical History: Negative for: Anemia; Hemophilia; Human Immunodeficiency Virus; Lymphedema;  Sickle Cell Disease Gastrointestinal Medical History: Negative for: Cirrhosis ; Colitis; Crohnos; Hepatitis A; Hepatitis B; Hepatitis C Past Medical History Notes: [name], [name] ([id number]) ulcer issues Endocrine Medical History: Negative for: Type I Diabetes; Type II Diabetes Genitourinary Medical History: Negative for: End Stage Renal Disease Immunological Medical History: Negative for: Lupus Erythematosus; Raynaudos; Scleroderma Integumentary (Skin) Medical History: Negative for: History of Burn; History of pressure wounds Musculoskeletal Medical History: Negative for: Gout; Rheumatoid Arthritis; Osteoarthritis Neurologic Medical History: Negative for: Dementia; Neuropathy; Quadriplegia; Paraplegia; Seizure Disorder Oncologic Medical History: Negative for: Received Chemotherapy HBO Extended History Items Eyes: Cataracts Immunizations Pneumococcal Vaccine: Received Pneumococcal Vaccination: Yes Implantable Devices None Family and Social History Cancer: Yes - Father,Mother,Siblings; Diabetes: No; Heart Disease: No; Hereditary Spherocytosis: No;  Hypertension: No; Kidney Disease: No; Lung Disease: No; Seizures: No; Stroke: No; Thyroid Problems: No; Tuberculosis: No; Never smoker; Marital Status - Widowed; Alcohol Use: Never; Drug Use: No History; Caffeine Use: Daily - coffee; Financial Concerns: No; Food, Clothing or Shelter Needs: No; Support System Lacking: No; Transportation Concerns: No Physician Affirmation I have reviewed and agree with the above information. Electronic Signature(s) [name], [name] ([id number]) Signed: 01/16/2019 5:39:21 PM By: Worthy Keeler PA-C Signed: 01/17/2019 4:20:53 PM By: Army Melia Entered By: Worthy Keeler on 01/16/2019 17:23:22 [name], [name] ([id number]) -------------------------------------------------------------------------------- SuperBill Details Patient Name: Sandra Pair B. Date of Service: 01/16/2019 Medical Record  Number: [id number] Patient Account Number: [id number] Date of Birth/Sex: 04/01/1921 (85 y.o. F) Treating RN: Army Melia Primary Care Provider: [name] Other Clinician: Referring Provider: [name] Treating Provider/Extender: Melburn Hake, HOYT Weeks in Treatment: 5 Diagnosis Coding ICD-10 Codes Code Description I87.2 Venous insufficiency (chronic) (peripheral) L97.822 Non-pressure chronic ulcer of other part of left lower leg with fat layer exposed Facility Procedures CPT4 Code Description: 32951884 11042 - DEB SUBQ TISSUE 20 SQ CM/< ICD-10 Diagnosis Description L97.822 Non-pressure chronic ulcer of other part of left lower leg with I87.2 Venous insufficiency (chronic) (peripheral) Modifier: fat layer expos Quantity: 1 ed Physician Procedures CPT4 Code Description: 1660630 11042 - WC PHYS SUBQ TISS 20 SQ CM ICD-10 Diagnosis Description L97.822 Non-pressure chronic ulcer of other part of left lower leg with I87.2 Venous insufficiency (chronic) (peripheral) Modifier: fat layer expos Quantity: 1 ed Electronic Signature(s) Signed: 01/16/2019 5:29:22 PM By: Worthy Keeler PA-C Entered By: Worthy Keeler on 01/16/2019 17:29:22

## 2019-01-16 NOTE — Progress Notes (Addendum)
CAMBRYN, CHARTERS ([id number]) Visit Report for 01/16/2019 Arrival Information Details Patient Name: Weeks, [name] Date of Service: 01/16/2019 3:30 PM Medical Record Number: [id number] Patient Account Number: [id number] Date of Birth/Sex: January 21, 1921 (85 y.o. F) Treating RN: Montey Hora Primary Care [name] Other Clinician: Referring [name] Treating [name], HOYT Weeks in Treatment: 5 Visit Information History Since Last Visit Added or deleted any medications: No Patient Arrived: Cane Any new allergies or adverse reactions: No Arrival Time: 15:37 Had a fall or experienced change in No Accompanied By: daughter activities of daily living that may affect Transfer Assistance: None risk of falls: Patient Identification Verified: Yes Signs or symptoms of abuse/neglect since last visito No Secondary Verification Process Completed: Yes Hospitalized since last visit: No Patient Requires Transmission-Based Precautions: No Implantable device outside of the clinic excluding No Patient Has Alerts: No cellular tissue based products placed in the center since last visit: Has Dressing in Place as Prescribed: Yes Has Compression in Place as Prescribed: Yes Pain Present Now: No Electronic Signature(s) Signed: 01/16/2019 4:23:33 PM By: Montey Hora Entered By: Montey Hora on 01/16/2019 15:39:15 [name] ([id number]) -------------------------------------------------------------------------------- Lower Extremity Assessment Details Patient Name: Weeks, [name] Date of Service: 01/16/2019 3:30 PM Medical Record Number: [id number] Patient Account Number: [id number] Date of Birth/Sex: Dec 13, 1920 (85 y.o. F) Treating RN: Montey Hora Primary Care Usama Harkless: [place] Weeks Other Clinician: Referring Corbett Moulder: [place] Weeks Treating Orel Cooler/Extender: STONE III, HOYT Weeks in Treatment: 5 Edema Assessment Assessed: [Left: No]  [Right: No] Edema: [Left: Ye] [Right: s] Vascular Assessment Pulses: Dorsalis Pedis Palpable: [Left:Yes] Electronic Signature(s) Signed: 01/16/2019 4:23:33 PM By: Montey Hora Entered By: Montey Hora on 01/16/2019 15:48:06 [name] ([id number]) -------------------------------------------------------------------------------- Multi Wound Chart Details Patient Name: Weeks, [name] Date of Service: 01/16/2019 3:30 PM Medical Record Number: [id number] Patient Account Number: [id number] Date of Birth/Sex: Mar 16, 1921 (85 y.o. F) Treating RN: Army Melia Primary Care Ashyra Cantin: [place] Weeks Other Clinician: Referring Eviana Sibilia: [place] Weeks Treating Orpah Hausner/Extender: Melburn Weeks, HOYT Weeks in Treatment: 5 Vital Signs Height(in): 64 Pulse(bpm): 68 Weight(lbs): 148 Blood Pressure(mmHg): 162/71 Body Mass Index(BMI): 25 Temperature(F): 98.0 Respiratory Rate 16 (breaths/min): Photos: [N/A:N/A] Wound Location: Left Lower Leg - Anterior N/A N/A Wounding Event: Other Lesion N/A N/A Primary Etiology: Skin Tear N/A N/A Comorbid History: Cataracts, Hypertension N/A N/A Date Acquired: 11/06/2018 N/A N/A Weeks of Treatment: 5 N/A N/A Wound Status: Open N/A N/A Measurements L x W x D 4.5x1.6x0.2 N/A N/A (cm) Area (cm) : 5.655 N/A N/A Volume (cm) : 1.131 N/A N/A % Reduction in Area: 66.70% N/A N/A % Reduction in Volume: 33.30% N/A N/A Classification: Full Thickness Without N/A N/A Exposed Support Structures Exudate Amount: Medium N/A N/A Exudate Type: Serosanguineous N/A N/A Exudate Color: red, brown N/A N/A Wound Margin: Indistinct, nonvisible N/A N/A Granulation Amount: Large (67-100%) N/A N/A Granulation Quality: Red, Hyper-granulation N/A N/A Necrotic Amount: Small (1-33%) N/A N/A Exposed Structures: Fat Layer (Subcutaneous N/A N/A Tissue) Exposed: Yes Fascia: No Tendon: No Muscle: No Joint: No Bone: No Weeks, [name] ([id number]) Epithelialization: None  N/A N/A Treatment Notes Electronic Signature(s) Signed: 01/16/2019 4:30:40 PM By: Army Melia Entered By: Army Melia on 01/16/2019 16:06:35 [place], [name] ([id number]) -------------------------------------------------------------------------------- [place] Details Patient Name: [name] B. Date of Service: 01/16/2019 3:30 PM Medical Record Number: [id number] Patient Account Number: [id number] Date of Birth/Sex: 1921-01-09 (85 y.o. F) Treating RN: Army Melia Primary Care Rochel Privett: [place] Weeks Other Clinician: Referring Sanna Porcaro: [place] Weeks Treating Jerome Otter/Extender: Joaquim Lai  III, HOYT Weeks in Treatment: 5 Active Inactive Necrotic Tissue Nursing Diagnoses: Impaired tissue integrity related to necrotic/devitalized tissue Goals: Necrotic/devitalized tissue will be minimized in the wound bed Date Initiated: 12/19/2018 Target Resolution Date: 12/25/2018 Goal Status: Active Interventions: Assess patient pain level pre-, during and post procedure and prior to discharge Treatment Activities: Enzymatic debridement : 12/19/2018 Notes: Orientation to the Wound Care Program Nursing Diagnoses: Knowledge deficit related to the wound healing center program Goals: Patient/caregiver will verbalize understanding of the [place] Date Initiated: 12/19/2018 Target Resolution Date: 12/25/2018 Goal Status: Active Interventions: Provide education on orientation to the wound center Notes: Pain, Acute or Chronic Nursing Diagnoses: Potential alteration in comfort, pain Goals: Patient will verbalize adequate pain control and receive pain control interventions during procedures as needed Date Initiated: 12/19/2018 Target Resolution Date: 12/25/2018 Goal Status: Active [name] ([id number]) Interventions: Provide education on pain management Notes: Wound/Skin Impairment Nursing Diagnoses: Impaired tissue  integrity Goals: Patient/caregiver will verbalize understanding of skin care regimen Date Initiated: 12/19/2018 Target Resolution Date: 12/25/2018 Goal Status: Active Interventions: Assess ulceration(s) every visit Treatment Activities: Skin care regimen initiated : 12/19/2018 Notes: Electronic Signature(s) Signed: 01/16/2019 4:30:40 PM By: Army Melia Entered By: Army Melia on 01/16/2019 16:05:46 Chimento, [name] ([id number]) -------------------------------------------------------------------------------- Pain Assessment Details Patient Name: [name] B. Date of Service: 01/16/2019 3:30 PM Medical Record Number: [id number] Patient Account Number: [id number] Date of Birth/Sex: [birth year] (85 y.o. F) Treating RN: Montey Hora Primary Care Urania Pearlman: [place] Weeks Other Clinician: Referring Jalil Lorusso: [place] Weeks Treating Siriyah Ambrosius/Extender: Melburn Weeks, HOYT Weeks in Treatment: 5 Active Problems Location of Pain Severity and Description of Pain Patient Has Paino No Site Locations Pain Management and Medication Current Pain Management: Electronic Signature(s) Signed: 01/16/2019 4:23:33 PM By: Montey Hora Entered By: Montey Hora on 01/16/2019 15:39:22 Pettry, [name] ([id number]) -------------------------------------------------------------------------------- Patient/Caregiver Education Details Patient Name: [name] B. Date of Service: 01/16/2019 3:30 PM Medical Record Number: [id number] Patient Account Number: [id number] Date of Birth/Gender: [birth year] (85 y.o. F) Treating RN: Army Melia Primary Care Physician: Maryland Weeks Other Clinician: Referring Physician: [place] Weeks Treating Physician/Extender: Melburn Weeks, HOYT Weeks in Treatment: 5 Education Assessment Education Provided To: Patient Education Topics Provided Wound/Skin Impairment: Handouts: Caring for Your Ulcer Methods: Demonstration, Explain/Verbal Responses: State content  correctly Electronic Signature(s) Signed: 01/16/2019 4:30:40 PM By: Army Melia Entered By: Army Melia on 01/16/2019 16:12:40 Burback, [name] ([id number]) -------------------------------------------------------------------------------- Wound Assessment Details Patient Name: Weeks, [name] Date of Service: 01/16/2019 3:30 PM Medical Record Number: [id number] Patient Account Number: [id number] Date of Birth/Sex: Mar 26, 1921 (85 y.o. F) Treating RN: Montey Hora Primary Care Remmy Crass: [place] Weeks Other Clinician: Referring Coulton Schlink: [place] Weeks Treating Marton Malizia/Extender: Melburn Weeks, HOYT Weeks in Treatment: 5 Wound Status Wound Number: 1 Primary Etiology: Skin Tear Wound Location: Left Lower Leg - Anterior Wound Status: Open Wounding Event: Other Lesion Comorbid History: Cataracts, Hypertension Date Acquired: 11/06/2018 Weeks Of Treatment: 5 Clustered Wound: No Photos Wound Measurements Length: (cm) 4.5 Width: (cm) 1.6 Depth: (cm) 0.2 Area: (cm) 5.655 Volume: (cm) 1.131 % Reduction in Area: 66.7% % Reduction in Volume: 33.3% Epithelialization: None Tunneling: No Undermining: No Wound Description Full Thickness Without Exposed Support Classification: Structures Wound Margin: Indistinct, nonvisible Exudate Medium Amount: Exudate Type: Serosanguineous Exudate Color: red, brown Foul Odor After Cleansing: No Slough/Fibrino Yes Wound Bed Granulation Amount: Large (67-100%) Exposed Structure Granulation Quality: Red, Hyper-granulation Fascia Exposed: No Necrotic Amount: Small (1-33%) Fat Layer (Subcutaneous Tissue) Exposed: Yes Necrotic Quality: Adherent Slough Tendon Exposed: No Muscle  Exposed: No Joint Exposed: No Bone Exposed: No Weeks, [name] ([id number]) Electronic Signature(s) Signed: 01/16/2019 4:23:33 PM By: Montey Hora Entered By: Montey Hora on 01/16/2019 15:46:26 Seda, [name]  ([id number]) -------------------------------------------------------------------------------- Vitals Details Patient Name: Weeks, [name] Date of Service: 01/16/2019 3:30 PM Medical Record Number: [id number] Patient Account Number: [id number] Date of Birth/Sex: 1920-12-29 (85 y.o. F) Treating RN: Montey Hora Primary Care Carlus Stay: [place] Weeks Other Clinician: Referring Kendarrius Tanzi: [place] Weeks Treating Lindamarie Maclachlan/Extender: Melburn Weeks, HOYT Weeks in Treatment: 5 Vital Signs Time Taken: 15:39 Temperature (F): 98.0 Height (in): 64 Pulse (bpm): 68 Weight (lbs): 148 Respiratory Rate (breaths/min): 16 Body Mass Index (BMI): 25.4 Blood Pressure (mmHg): 162/71 Reference Range: 80 - 120 mg / dl Electronic Signature(s) Signed: 01/16/2019 4:23:33 PM By: Montey Hora Entered By: Montey Hora on 01/16/2019 15:40:34

## 2019-01-23 ENCOUNTER — Encounter: Payer: Medicare Other | Admitting: Physician Assistant

## 2019-01-23 ENCOUNTER — Other Ambulatory Visit: Payer: Self-pay

## 2019-01-23 DIAGNOSIS — S81812A Laceration without foreign body, left lower leg, initial encounter: Secondary | ICD-10-CM | POA: Diagnosis not present

## 2019-01-23 NOTE — Progress Notes (Signed)
[name], [name] ([id number]) Visit Report for 01/23/2019 Arrival Information Weeks Patient Name: [name], [name]. Date of Service: 01/23/2019 3:45 PM Medical Record Number: [id number] Patient Account Number: [id number] Date of Birth/Sex: 02-Apr-1921 (85 y.o. F) Treating RN: Army Melia Primary Care Taletha Twiford: [name] Other Clinician: Referring Loraina Stauffer: [name] Treating Elianne Gubser/Extender: Melburn Hake, HOYT Weeks in Treatment: 6 Visit Information History Since Last Visit Added or deleted any medications: No Patient Arrived: [name] Any new allergies or adverse reactions: No Arrival Time: 15:57 Had a fall or experienced change in No Accompanied By: son activities of daily living that may affect Transfer Assistance: None risk of falls: Patient Identification Verified: Yes Signs or symptoms of abuse/neglect since last visito No Secondary Verification Process Completed: Yes Hospitalized since last visit: No Patient Requires Transmission-Based Precautions: No Implantable device outside of the clinic excluding No Patient Has Alerts: No cellular tissue based products placed in the center since last visit: Has Dressing in Place as Prescribed: Yes Pain Present Now: No Electronic Signature(s) Signed: 01/23/2019 4:40:32 PM By: Lorine Bears RCP, RRT, CHT Entered By: Lorine Bears on 01/23/2019 15:59:02 [name], [name] ([id number]) -------------------------------------------------------------------------------- Lower Extremity Assessment Weeks Patient Name: [name], [name] Date of Service: 01/23/2019 3:45 PM Medical Record Number: [id number] Patient Account Number: [id number] Date of Birth/Sex: 1921/05/04 (85 y.o. F) Treating RN: Montey Hora Primary Care Yuko Coventry: [name] Other Clinician: Referring Kadey Mihalic: [name] Treating Annisten Manchester/Extender: STONE III, HOYT Weeks in Treatment: 6 Edema Assessment Assessed: [Left: No] [Right:  No] Edema: [Left: N] [Right: o] Vascular Assessment Pulses: Dorsalis Pedis Palpable: [Left:Yes] Electronic Signature(s) Signed: 01/23/2019 4:37:52 PM By: Montey Hora Entered By: Montey Hora on 01/23/2019 16:13:35 Lantier, [name] ([id number]) -------------------------------------------------------------------------------- Multi Wound Chart Weeks Patient Name: [name], [name] Date of Service: 01/23/2019 3:45 PM Medical Record Number: [id number] Patient Account Number: [id number] Date of Birth/Sex: 07-17-1920 (85 y.o. F) Treating RN: Army Melia Primary Care Amariss Detamore: [name] Other Clinician: Referring Juana Montini: [name] Treating Arsenia Goracke/Extender: Melburn Hake, HOYT Weeks in Treatment: 6 Vital Signs Height(in): 64 Pulse(bpm): 19 Weight(lbs): 148 Blood Pressure(mmHg): 152/64 Body Mass Index(BMI): 25 Temperature(F): 98.2 Respiratory Rate 16 (breaths/min): Photos: [N/A:N/A] Wound Location: Left Lower Leg - Anterior N/A N/A Wounding Event: Other Lesion N/A N/A Primary Etiology: Skin Tear N/A N/A Comorbid History: Cataracts, Hypertension N/A N/A Date Acquired: 11/06/2018 N/A N/A Weeks of Treatment: 6 N/A N/A Wound Status: Open N/A N/A Measurements L x W x D 3.8x1.4x0.1 N/A N/A (cm) Area (cm) : 4.178 N/A N/A Volume (cm) : 0.418 N/A N/A % Reduction in Area: 75.40% N/A N/A % Reduction in Volume: 75.40% N/A N/A Classification: Full Thickness Without N/A N/A Exposed Support Structures Exudate Amount: Medium N/A N/A Exudate Type: Serosanguineous N/A N/A Exudate Color: red, brown N/A N/A Wound Margin: Indistinct, nonvisible N/A N/A Granulation Amount: Large (67-100%) N/A N/A Granulation Quality: Red, Hyper-granulation N/A N/A Necrotic Amount: Small (1-33%) N/A N/A Exposed Structures: Fat Layer (Subcutaneous N/A N/A Tissue) Exposed: Yes Fascia: No Tendon: No Muscle: No Joint: No Bone: No Boerner, Gae B. ([id number]) Epithelialization: None N/A  N/A Treatment Notes Electronic Signature(s) Signed: 01/23/2019 4:38:54 PM By: Army Melia Entered By: Army Melia on 01/23/2019 16:27:59 Souffrant, [name] ([id number]) -------------------------------------------------------------------------------- [name] [name] Patient Name: Sandra Pair B. Date of Service: 01/23/2019 3:45 PM Medical Record Number: [id number] Patient Account Number: [id number] Date of Birth/Sex: 04-13-1921 (85 y.o. F) Treating RN: Army Melia Primary Care Ameerah Huffstetler: [name] Other Clinician: Referring Xian Apostol: [name] Treating Sherill Mangen/Extender: Melburn Hake, HOYT  Weeks in Treatment: 6 Active Inactive Necrotic Tissue Nursing Diagnoses: Impaired tissue integrity related to necrotic/devitalized tissue Goals: Necrotic/devitalized tissue will be minimized in the wound bed Date Initiated: 12/19/2018 Target Resolution Date: 12/25/2018 Goal Status: Active Interventions: Assess patient pain level pre-, during and post procedure and prior to discharge Treatment Activities: Enzymatic debridement : 12/19/2018 Notes: Orientation to the Wound Care Program Nursing Diagnoses: Knowledge deficit related to the wound healing center program Goals: Patient/caregiver will verbalize understanding of the [place] Date Initiated: 12/19/2018 Target Resolution Date: 12/25/2018 Goal Status: Active Interventions: Provide education on orientation to the wound center Notes: Pain, Acute or Chronic Nursing Diagnoses: Potential alteration in comfort, pain Goals: Patient will verbalize adequate pain control and receive pain control interventions during procedures as needed Date Initiated: 12/19/2018 Target Resolution Date: 12/25/2018 Goal Status: Active [name], [name] ([id number]) Interventions: Provide education on pain management Notes: Wound/Skin Impairment Nursing Diagnoses: Impaired tissue integrity Goals: Patient/caregiver  will verbalize understanding of skin care regimen Date Initiated: 12/19/2018 Target Resolution Date: 12/25/2018 Goal Status: Active Interventions: Assess ulceration(s) every visit Treatment Activities: Skin care regimen initiated : 12/19/2018 Notes: Electronic Signature(s) Signed: 01/23/2019 4:38:54 PM By: Army Melia Entered By: Army Melia on 01/23/2019 16:27:50 [name], [name] ([id number]) -------------------------------------------------------------------------------- Pain Assessment Weeks Patient Name: Sandra Pair B. Date of Service: 01/23/2019 3:45 PM Medical Record Number: [id number] Patient Account Number: [id number] Date of Birth/Sex: 1921/05/03 (85 y.o. F) Treating RN: Army Melia Primary Care Rosea Dory: [name] Other Clinician: Referring Orlandis Sanden: [name] Treating Kaila Devries/Extender: Melburn Hake, HOYT Weeks in Treatment: 6 Active Problems Location of Pain Severity and Description of Pain Patient Has Paino No Site Locations Pain Management and Medication Current Pain Management: Electronic Signature(s) Signed: 01/23/2019 4:38:54 PM By: Army Melia Signed: 01/23/2019 4:40:32 PM By: Lorine Bears RCP, RRT, CHT Entered By: Lorine Bears on 01/23/2019 15:59:11 [name], [name] ([id number]) -------------------------------------------------------------------------------- Patient/Caregiver Education Weeks Patient Name: Sandra Pair B. Date of Service: 01/23/2019 3:45 PM Medical Record Number: [id number] Patient Account Number: [id number] Date of Birth/Gender: [birth year] (85 y.o. F) Treating RN: Army Melia Primary Care Physician: [name] Other Clinician: Referring Physician: [name] Treating Physician/Extender: Melburn Hake, HOYT Weeks in Treatment: 6 Education Assessment Education Provided To: Patient Education Topics Provided Wound/Skin Impairment: Handouts: Caring for Your Ulcer Methods: Demonstration,  Explain/Verbal Responses: State content correctly Electronic Signature(s) Signed: 01/23/2019 4:38:54 PM By: Army Melia Entered By: Army Melia on 01/23/2019 16:31:53 [name], [name] ([id number]) -------------------------------------------------------------------------------- Wound Assessment Weeks Patient Name: [name], [name] Date of Service: 01/23/2019 3:45 PM Medical Record Number: [id number] Patient Account Number: [id number] Date of Birth/Sex: 1921-04-25 (85 y.o. F) Treating RN: Montey Hora Primary Care Maxximus Gotay: [name] Other Clinician: Referring Sabri Teal: [name] Treating Kirtis Challis/Extender: Melburn Hake, HOYT Weeks in Treatment: 6 Wound Status Wound Number: 1 Primary Etiology: Skin Tear Wound Location: Left Lower Leg - Anterior Wound Status: Open Wounding Event: Other Lesion Comorbid History: Cataracts, Hypertension Date Acquired: 11/06/2018 Weeks Of Treatment: 6 Clustered Wound: No Photos Wound Measurements Length: (cm) 3.8 Width: (cm) 1.4 Depth: (cm) 0.1 Area: (cm) 4.178 Volume: (cm) 0.418 % Reduction in Area: 75.4% % Reduction in Volume: 75.4% Epithelialization: None Tunneling: No Undermining: No Wound Description Full Thickness Without Exposed Support Classification: Structures Wound Margin: Indistinct, nonvisible Exudate Medium Amount: Exudate Type: Serosanguineous Exudate Color: red, brown Foul Odor After Cleansing: No Slough/Fibrino Yes Wound Bed Granulation Amount: Large (67-100%) Exposed Structure Granulation Quality: Red, Hyper-granulation Fascia Exposed: No Necrotic Amount: Small (1-33%) Fat Layer (Subcutaneous Tissue)  Exposed: Yes Necrotic Quality: Adherent Slough Tendon Exposed: No Muscle Exposed: No Joint Exposed: No Bone Exposed: No [name], [name] ([id number]) Electronic Signature(s) Signed: 01/23/2019 4:37:52 PM By: Montey Hora Entered By: Montey Hora on 01/23/2019 16:13:15 [name], [name]  ([id number]) -------------------------------------------------------------------------------- [place] Weeks Patient Name: Sandra Pair B. Date of Service: 01/23/2019 3:45 PM Medical Record Number: [id number] Patient Account Number: [id number] Date of Birth/Sex: 1920/06/12 (85 y.o. F) Treating RN: Army Melia Primary Care Odarius Dines: [name] Other Clinician: Referring Indiah Heyden: [name] Treating Calieb Lichtman/Extender: Melburn Hake, HOYT Weeks in Treatment: 6 Vital Signs Time Taken: 15:58 Temperature (F): 98.2 Height (in): 64 Pulse (bpm): 67 Weight (lbs): 148 Respiratory Rate (breaths/min): 16 Body Mass Index (BMI): 25.4 Blood Pressure (mmHg): 152/64 Reference Range: 80 - 120 mg / dl Electronic Signature(s) Signed: 01/23/2019 4:40:32 PM By: Lorine Bears RCP, RRT, CHT Entered By: Lorine Bears on 01/23/2019 15:59:47

## 2019-01-23 NOTE — Progress Notes (Addendum)
ANJANNETTE, WEYER ([id number]) Visit Report for 01/23/2019 Chief Complaint Document Details Patient Name: Weeks, [name] Date of Service: 01/23/2019 3:45 PM Medical Record Number: [id number] Patient Account Number: [id number] Date of Birth/Sex: 17-May-1921 (85 y.o. F) Treating RN: Army Melia Primary Care Provider: [name] Other Clinician: Referring Provider: [name] Treating Provider/Extender: Melburn Hake, HOYT Weeks in Treatment: 6 Information Obtained from: Patient Chief Complaint Left leg wound, referred by her Surgeon Electronic Signature(s) Signed: 01/23/2019 4:24:18 PM By: Worthy Keeler PA-C Entered By: Worthy Keeler on 01/23/2019 16:24:18 Weeks, [name] ([id number]) -------------------------------------------------------------------------------- Debridement Details Patient Name: [name] Date of Service: 01/23/2019 3:45 PM Medical Record Number: [id number] Patient Account Number: [id number] Date of Birth/Sex: Dec 27, 1920 (85 y.o. F) Treating RN: Army Melia Primary Care Provider: [name] Other Clinician: Referring Provider: [name] Treating Provider/Extender: Melburn Hake, HOYT Weeks in Treatment: 6 Debridement Performed for Wound #1 Left,Anterior Lower Leg Assessment: Performed By: Physician STONE III, HOYT E., PA-C Debridement Type: Debridement Level of Consciousness (Pre- Awake and Alert procedure): Pre-procedure Verification/Time Yes - 16:29 Out Taken: Start Time: 16:29 Pain Control: Lidocaine Total Area Debrided (L x W): 3.8 (cm) x 1.4 (cm) = 5.32 (cm) Tissue and other material Slough, Subcutaneous, Slough debrided: Level: Skin/Subcutaneous Tissue Debridement Description: Excisional Instrument: Curette Bleeding: Minimum Hemostasis Achieved: Pressure End Time: 16:30 Response to Treatment: Procedure was tolerated well Level of Consciousness Awake and Alert (Post-procedure): Post Debridement Measurements of Total  Wound Length: (cm) 3.8 Width: (cm) 1.4 Depth: (cm) 0.1 Volume: (cm) 0.418 Character of Wound/Ulcer Post Debridement: Stable Post Procedure Diagnosis Same as Pre-procedure Electronic Signature(s) Signed: 01/23/2019 4:38:54 PM By: Army Melia Signed: 01/26/2019 6:59:38 PM By: Worthy Keeler PA-C Entered By: Army Melia on 01/23/2019 16:30:05 Weeks, [name] ([id number]) -------------------------------------------------------------------------------- HPI Details Patient Name: [name] Date of Service: 01/23/2019 3:45 PM Medical Record Number: [id number] Patient Account Number: [id number] Date of Birth/Sex: 08-19-1920 (85 y.o. F) Treating RN: Army Melia Primary Care Provider: [name] Other Clinician: Referring Provider: [name] Treating Provider/Extender: Melburn Hake, HOYT Weeks in Treatment: 6 History of Present Illness HPI Description: 85 year old female who developed a left anterior leg skin tear more than a month ago with a flap on it, this first started out when family were helping her put on a pair of pants and the edge of the pant leg scraped and pulled off in the area of skin on the left anterior shin. She was seen at an urgent care for the skin tear where the used a scissors to cut the loose lying skin flap on top of the wound. To give her course of antibiotics which I believe was for 10 days that she completed almost a month ago. Since then the wound had been scabbed but she experienced a lot of pain in this area family has been using Vaseline on this area after using Neosporin for some time and she is referred to the clinic for further care of the wound with scab on it. Patient denies any fevers chills shakes. She is ambulatory and has family assist her when needed. Patient is at her baseline in terms of her activity level and functional status other than experiencing some pain in the left leg around the scab. ABIs were not obtained today on the left on  account of significant pain associated with the scabbing wound Patient has history of breast cancer status post mastectomy in remission, colon cancer status post colectomy in remission, iron deficiency for which she gets  iron infusions, hypertension, she is not on any blood thinners 12/19/18 on evaluation today patient actually appears to be redoing okay in regard to the skin tear on the left anterior lower extremity. With that being said this is still showing signs of a significant amount of necrotic tissue on the surface of the wound unfortunately. She's been "trying to let this scab" which obviously is not the right thing at this point. I do think that cental in keeping this area covered is gonna be the ideal way to get this to heal most appropriately. Obviously I explained to the patient as well as her son who was present during the evaluation today that we really need to try and get the necrotic tissue off of the surface of the wound in order to let this heal appropriately. Fortunately there's no signs of active infection. 12/26/2018 upon evaluation today patient appears to be doing better with regard to her left lower extremity ulcer. She has been tolerating the dressing changes specifically with the Santyl without complication. Fortunately there is no signs of active infection at this time. No fevers, chills, nausea, vomiting, or diarrhea. The dry dark eschar is loosening up and does seem to be doing much better today which is great news. Overall I am pleased with the progress. 01/02/19 on evaluation today patient actually appears to be doing excellent in regard to her lower extremity ulcer. She's been tolerating the dressing changes without complication. Fortunately the slough is loosening up and I do believe that the Santyl has been very beneficial in this regard. With that being said I did take almost a week for them to get the medication from the pharmacy [facility] seems to take forever to get  this in stock. Other than that I'm overall pleased with how things again are appearing today. 01/09/2019 upon evaluation today patient appears to be doing quite well with regard to her left anterior lower extremity ulcer. She has been tolerating the dressing changes without complication. Fortunately there is no signs of active infection at this time. She does have some slough noted on the surface of the wound but overall this appears to be doing much better in my opinion. 01/16/2019 on evaluation today patient appears to be doing well with regard to her left lower extremity ulcer. She has been tolerating the dressing changes without complication and very pleased with the progress that she seems to be making. Fortunately there is no signs of active infection at this time. I do believe it may be time to switch to a different dressing I am thinking a Hydrofera Blue dressing would be beneficial. 01/23/19 on evaluation today patient appears to be doing excellent in regard to her left lower Trinity ulcer. She's been tolerating the dressing changes without complication. Fortunately there is no sign of active infection at this time. She's also having no significant pain which is excellent news. DAILANY, MOCCIA ([id number]) Electronic Signature(s) Signed: 01/26/2019 6:59:38 PM By: Worthy Keeler PA-C Entered By: Worthy Keeler on 01/26/2019 18:46:04 Weeks, [name] ([id number]) -------------------------------------------------------------------------------- Physical Exam Details Patient Name: Weeks, [name] Date of Service: 01/23/2019 3:45 PM Medical Record Number: [id number] Patient Account Number: [id number] Date of Birth/Sex: 1920/06/20 (85 y.o. F) Treating RN: Army Melia Primary Care Provider: [name] Other Clinician: Referring Provider: [name] Treating Provider/Extender: Melburn Hake, HOYT Weeks in Treatment: 6 Notes Patient's wound bed today did require sharp debridement which  was performed without complication post debridement wound bed appears to be doing much  better and again I'm very pleased that she is feeling so well. It is still gonna take some time to get this to completely close. Electronic Signature(s) Signed: 01/26/2019 6:59:38 PM By: Worthy Keeler PA-C Entered By: Worthy Keeler on 01/26/2019 18:46:51 Stemmler, [name] ([id number]) -------------------------------------------------------------------------------- Physician Orders Details Patient Name: [name] Date of Service: 01/23/2019 3:45 PM Medical Record Number: [id number] Patient Account Number: [id number] Date of Birth/Sex: 1921-03-20 (85 y.o. F) Treating RN: Army Melia Primary Care Provider: [name] Other Clinician: Referring Provider: [name] Treating Provider/Extender: Melburn Hake, HOYT Weeks in Treatment: 6 Verbal / Phone Orders: No Diagnosis Coding ICD-10 Coding Code Description I87.2 Venous insufficiency (chronic) (peripheral) L97.822 Non-pressure chronic ulcer of other part of left lower leg with fat layer exposed Wound Cleansing Wound #1 Left,Anterior Lower Leg o Dial antibacterial soap, wash wounds, rinse and pat dry prior to dressing wounds o May Shower, gently pat wound dry prior to applying new dressing. Primary Wound Dressing Wound #1 Left,Anterior Lower Leg o Hydrafera Blue Ready Transfer Secondary Dressing Wound #1 Left,Anterior Lower Leg o ABD and Kerlix/Conform Dressing Change Frequency Wound #1 Left,Anterior Lower Leg o Change dressing every day. Follow-up Appointments o Return Appointment in 1 week. Edema Control Wound #1 Left,Anterior Lower Leg o Other: - Tubigrip G Electronic Signature(s) Signed: 01/23/2019 4:38:54 PM By: Army Melia Signed: 01/26/2019 6:59:38 PM By: Worthy Keeler PA-C Entered By: Army Melia on 01/23/2019 16:31:37 Pick, [name]  ([id number]) -------------------------------------------------------------------------------- Problem List Details Patient Name: Weeks, [name] Date of Service: 01/23/2019 3:45 PM Medical Record Number: [id number] Patient Account Number: [id number] Date of Birth/Sex: 23-Nov-1920 (85 y.o. F) Treating RN: Army Melia Primary Care Provider: [name] Other Clinician: Referring Provider: [name] Treating Provider/Extender: Melburn Hake, HOYT Weeks in Treatment: 6 Active Problems ICD-10 Evaluated Encounter Code Description Active Date Today Diagnosis I87.2 Venous insufficiency (chronic) (peripheral) 12/10/2018 No Yes L97.822 Non-pressure chronic ulcer of other part of left lower leg with 12/10/2018 No Yes fat layer exposed Inactive Problems Resolved Problems Electronic Signature(s) Signed: 01/23/2019 4:24:10 PM By: Worthy Keeler PA-C Entered By: Worthy Keeler on 01/23/2019 16:24:10 Weeks, [name] ([id number]) -------------------------------------------------------------------------------- Progress Note Details Patient Name: Weeks, [name] Date of Service: 01/23/2019 3:45 PM Medical Record Number: [id number] Patient Account Number: [id number] Date of Birth/Sex: [birth year] (85 y.o. F) Treating RN: Army Melia Primary Care Provider: [name] Other Clinician: Referring Provider: [name] Treating Provider/Extender: Melburn Hake, HOYT Weeks in Treatment: 6 Subjective Chief Complaint Information obtained from Patient Left leg wound, referred by her Surgeon History of Present Illness (HPI) 85 year old female who developed a left anterior leg skin tear more than a month ago with a flap on it, this first started out when family were helping her put on a pair of pants and the edge of the pant leg scraped and pulled off in the area of skin on the left anterior shin. She was seen at an urgent care for the skin tear where the used a scissors to cut the loose lying  skin flap on top of the wound. To give her course of antibiotics which I believe was for 10 days that she completed almost a month ago. Since then the wound had been scabbed but she experienced a lot of pain in this area family has been using Vaseline on this area after using Neosporin for some time and she is referred to the clinic for further care of the wound with scab on it. Patient denies any  fevers chills shakes. She is ambulatory and has family assist her when needed. Patient is at her baseline in terms of her activity level and functional status other than experiencing some pain in the left leg around the scab. ABIs were not obtained today on the left on account of significant pain associated with the scabbing wound Patient has history of breast cancer status post mastectomy in remission, colon cancer status post colectomy in remission, iron deficiency for which she gets iron infusions, hypertension, she is not on any blood thinners 12/19/18 on evaluation today patient actually appears to be redoing okay in regard to the skin tear on the left anterior lower extremity. With that being said this is still showing signs of a significant amount of necrotic tissue on the surface of the wound unfortunately. She's been "trying to let this scab" which obviously is not the right thing at this point. I do think that cental in keeping this area covered is gonna be the ideal way to get this to heal most appropriately. Obviously I explained to the patient as well as her son who was present during the evaluation today that we really need to try and get the necrotic tissue off of the surface of the wound in order to let this heal appropriately. Fortunately there's no signs of active infection. 12/26/2018 upon evaluation today patient appears to be doing better with regard to her left lower extremity ulcer. She has been tolerating the dressing changes specifically with the Santyl without complication.  Fortunately there is no signs of active infection at this time. No fevers, chills, nausea, vomiting, or diarrhea. The dry dark eschar is loosening up and does seem to be doing much better today which is great news. Overall I am pleased with the progress. 01/02/19 on evaluation today patient actually appears to be doing excellent in regard to her lower extremity ulcer. She's been tolerating the dressing changes without complication. Fortunately the slough is loosening up and I do believe that the Santyl has been very beneficial in this regard. With that being said I did take almost a week for them to get the medication from the pharmacy [facility] seems to take forever to get this in stock. Other than that I'm overall pleased with how things again are appearing today. 01/09/2019 upon evaluation today patient appears to be doing quite well with regard to her left anterior lower extremity ulcer. She has been tolerating the dressing changes without complication. Fortunately there is no signs of active infection at this time. She does have some slough noted on the surface of the wound but overall this appears to be doing much better in my opinion. 01/16/2019 on evaluation today patient appears to be doing well with regard to her left lower extremity ulcer. She has been tolerating the dressing changes without complication and very pleased with the progress that she seems to be making. Fortunately there is no signs of active infection at this time. I do believe it may be time to switch to a different dressing I am thinking a Hydrofera Blue dressing would be beneficial. Weeks, [name] ([id number]) 01/23/19 on evaluation today patient appears to be doing excellent in regard to her left lower Trinity ulcer. She's been tolerating the dressing changes without complication. Fortunately there is no sign of active infection at this time. She's also having no significant pain which is excellent news. Patient  History Information obtained from Patient. Family History Cancer - Father,Mother,Siblings, No family history of Diabetes, Heart Disease, Hereditary  Spherocytosis, Hypertension, Kidney Disease, Lung Disease, Seizures, Stroke, Thyroid Problems, Tuberculosis. Social History Never smoker, Marital Status - Widowed, Alcohol Use - Never, Drug Use - No History, Caffeine Use - Daily - coffee. Medical History Eyes Patient has history of Cataracts - both Hematologic/Lymphatic Denies history of Anemia, Hemophilia, Human Immunodeficiency Virus, Lymphedema, Sickle Cell Disease Respiratory Denies history of Aspiration, Asthma, Chronic Obstructive Pulmonary Disease (COPD), Pneumothorax, Sleep Apnea, Tuberculosis Cardiovascular Patient has history of Hypertension Denies history of Angina, Arrhythmia, Congestive Heart Failure, Coronary Artery Disease, Deep Vein Thrombosis, Hypotension, Myocardial Infarction, Peripheral Arterial Disease, Peripheral Venous Disease, Phlebitis, Vasculitis Gastrointestinal Denies history of Cirrhosis , Colitis, Crohn s, Hepatitis A, Hepatitis B, Hepatitis C Endocrine Denies history of Type I Diabetes, Type II Diabetes Genitourinary Denies history of End Stage Renal Disease Immunological Denies history of Lupus Erythematosus, Raynaud s, Scleroderma Integumentary (Skin) Denies history of History of Burn, History of pressure wounds Musculoskeletal Denies history of Gout, Rheumatoid Arthritis, Osteoarthritis Neurologic Denies history of Dementia, Neuropathy, Quadriplegia, Paraplegia, Seizure Disorder Oncologic Denies history of Received Chemotherapy Psychiatric Denies history of Anorexia/bulimia, Confinement Anxiety Medical And Surgical History Notes Gastrointestinal ulcer issues Review of Systems (ROS) Constitutional Symptoms (General Health) Denies complaints or symptoms of Fatigue, Fever, Chills, Marked Weight Change. Respiratory Denies complaints or symptoms of  Chronic or frequent coughs, Shortness of Breath. Cardiovascular Complains or has symptoms of LE edema. Denies complaints or symptoms of Chest pain. Psychiatric [place], [place] ([id number]) Denies complaints or symptoms of Anxiety, Claustrophobia. Objective Constitutional Vitals Time Taken: 3:58 PM, Height: 64 in, Weight: 148 lbs, BMI: 25.4, Temperature: 98.2 F, Pulse: 67 bpm, Respiratory Rate: 16 breaths/min, Blood Pressure: 152/64 mmHg. Integumentary (Hair, Skin) Wound #1 status is Open. Original cause of wound was Other Lesion. The wound is located on the Left,Anterior Lower Leg. The wound measures 3.8cm length x 1.4cm width x 0.1cm depth; 4.178cm^2 area and 0.418cm^3 volume. There is Fat Layer (Subcutaneous Tissue) Exposed exposed. There is no tunneling or undermining noted. There is a medium amount of serosanguineous drainage noted. The wound margin is indistinct and nonvisible. There is large (67-100%) red, hyper - granulation within the wound bed. There is a small (1-33%) amount of necrotic tissue within the wound bed including Adherent Slough. Assessment Active Problems ICD-10 Venous insufficiency (chronic) (peripheral) Non-pressure chronic ulcer of other part of left lower leg with fat layer exposed Procedures Wound #1 Pre-procedure diagnosis of Wound #1 is a Skin Tear located on the Left,Anterior Lower Leg . There was a Excisional Skin/Subcutaneous Tissue Debridement with a total area of 5.32 sq cm performed by STONE III, HOYT E., PA-C. With the following instrument(s): Curette Material removed includes Subcutaneous Tissue and Slough and after achieving pain control using Lidocaine. A time out was conducted at 16:29, prior to the start of the procedure. A Minimum amount of bleeding was controlled with Pressure. The procedure was tolerated well. Post Debridement Measurements: 3.8cm length x 1.4cm width x 0.1cm depth; 0.418cm^3 volume. Character of Wound/Ulcer Post Debridement  is stable. Post procedure Diagnosis Wound #1: Same as Pre-Procedure Weeks, [name] ([id number]) Plan Wound Cleansing: Wound #1 Left,Anterior Lower Leg: Dial antibacterial soap, wash wounds, rinse and pat dry prior to dressing wounds May Shower, gently pat wound dry prior to applying new dressing. Primary Wound Dressing: Wound #1 Left,Anterior Lower Leg: Hydrafera Blue Ready Transfer Secondary Dressing: Wound #1 Left,Anterior Lower Leg: ABD and Kerlix/Conform Dressing Change Frequency: Wound #1 Left,Anterior Lower Leg: Change dressing every day. Follow-up Appointments: Return Appointment in 1 week.  Edema Control: Wound #1 Left,Anterior Lower Leg: Other: - Tubigrip G 1. I'm gonna recommend currently that we go ahead and make a change to [employer] which I think would be better for her especially with some of the mild hyper granulation that were seen at this point. 2. I'm also going to recommend that we go ahead and continue with the Tubigrip which she seems to be tolerating without any complication. 3. I'm also gonna suggest that we go ahead and continue to have her elevate her legs as much as possible again with her dementia some of this is not gonna be quite as possible but nonetheless I think it can still be achieved to some degree. Please see above for specific wound care orders. We will see patient for re-evaluation in 1 week(s) here in the clinic. If anything worsens or changes patient will contact our office for additional recommendations. Electronic Signature(s) Signed: 01/26/2019 6:59:38 PM By: Worthy Keeler PA-C Entered By: Worthy Keeler on 01/26/2019 18:47:57 Basic, [name] ([id number]) -------------------------------------------------------------------------------- ROS/PFSH Details Patient Name: [name] Date of Service: 01/23/2019 3:45 PM Medical Record Number: [id number] Patient Account Number: [id number] Date of Birth/Sex: 1920/10/09 (85 y.o.  F) Treating RN: Army Melia Primary Care Provider: [name] Other Clinician: Referring Provider: [name] Treating Provider/Extender: Melburn Hake, HOYT Weeks in Treatment: 6 Information Obtained From Patient Constitutional Symptoms (General Health) Complaints and Symptoms: Negative for: Fatigue; Fever; Chills; Marked Weight Change Respiratory Complaints and Symptoms: Negative for: Chronic or frequent coughs; Shortness of Breath Medical History: Negative for: Aspiration; Asthma; Chronic Obstructive Pulmonary Disease (COPD); Pneumothorax; Sleep Apnea; Tuberculosis Cardiovascular Complaints and Symptoms: Positive for: LE edema Negative for: Chest pain Medical History: Positive for: Hypertension Negative for: Angina; Arrhythmia; Congestive Heart Failure; Coronary Artery Disease; Deep Vein Thrombosis; Hypotension; Myocardial Infarction; Peripheral Arterial Disease; Peripheral Venous Disease; Phlebitis; Vasculitis Psychiatric Complaints and Symptoms: Negative for: Anxiety; Claustrophobia Medical History: Negative for: Anorexia/bulimia; Confinement Anxiety Eyes Medical History: Positive for: Cataracts - both Hematologic/Lymphatic Medical History: Negative for: Anemia; Hemophilia; Human Immunodeficiency Virus; Lymphedema; Sickle Cell Disease Gastrointestinal Medical History: Negative for: Cirrhosis ; Colitis; Crohnos; Hepatitis A; Hepatitis B; Hepatitis C Past Medical History Notes: Sandra, Weeks ([id number]) ulcer issues Endocrine Medical History: Negative for: Type I Diabetes; Type II Diabetes Genitourinary Medical History: Negative for: End Stage Renal Disease Immunological Medical History: Negative for: Lupus Erythematosus; Raynaudos; Scleroderma Integumentary (Skin) Medical History: Negative for: History of Burn; History of pressure wounds Musculoskeletal Medical History: Negative for: Gout; Rheumatoid Arthritis; Osteoarthritis Neurologic Medical  History: Negative for: Dementia; Neuropathy; Quadriplegia; Paraplegia; Seizure Disorder Oncologic Medical History: Negative for: Received Chemotherapy HBO Extended History Items Eyes: Cataracts Immunizations Pneumococcal Vaccine: Received Pneumococcal Vaccination: Yes Implantable Devices None Family and Social History Cancer: Yes - Father,Mother,Siblings; Diabetes: No; Heart Disease: No; Hereditary Spherocytosis: No; Hypertension: No; Kidney Disease: No; Lung Disease: No; Seizures: No; Stroke: No; Thyroid Problems: No; Tuberculosis: No; Never smoker; Marital Status - Widowed; Alcohol Use: Never; Drug Use: No History; Caffeine Use: Daily - coffee; Financial Concerns: No; Food, Clothing or Shelter Needs: No; Support System Lacking: No; Transportation Concerns: No Physician Affirmation I have reviewed and agree with the above information. Electronic Signature(s) Sandra, Weeks ([id number]) Signed: 01/26/2019 6:59:38 PM By: Worthy Keeler PA-C Signed: 01/27/2019 8:03:40 AM By: Army Melia Entered By: Worthy Keeler on 01/26/2019 18:46:18 Weeks, [name] ([id number]) -------------------------------------------------------------------------------- SuperBill Details Patient Name: Badon, [name] Date of Service: 01/23/2019 Medical Record Number: [id number] Patient Account Number: [id number]  Date of Birth/Sex: [birth year] (85 y.o. F) Treating RN: Army Melia Primary Care Provider: [name] Other Clinician: Referring Provider: [name] Treating Provider/Extender: Melburn Hake, HOYT Weeks in Treatment: 6 Diagnosis Coding ICD-10 Codes Code Description I87.2 Venous insufficiency (chronic) (peripheral) L97.822 Non-pressure chronic ulcer of other part of left lower leg with fat layer exposed Facility Procedures CPT4 Code Description: [id number] - DEB SUBQ TISSUE 20 SQ CM/< ICD-10 Diagnosis Description L97.822 Non-pressure chronic ulcer of other part of left lower leg  with Modifier: fat layer expos Quantity: 1 ed Physician Procedures CPT4 Code Description: [id number] 11042 - WC PHYS SUBQ TISS 20 SQ CM ICD-10 Diagnosis Description L97.822 Non-pressure chronic ulcer of other part of left lower leg with Modifier: fat layer expos Quantity: 1 ed Electronic Signature(s) Signed: 01/26/2019 6:59:38 PM By: Worthy Keeler PA-C Entered By: Worthy Keeler on 01/26/2019 18:48:05

## 2019-01-31 ENCOUNTER — Encounter: Payer: Medicare Other | Attending: Physician Assistant | Admitting: Physician Assistant

## 2019-01-31 ENCOUNTER — Other Ambulatory Visit: Payer: Self-pay

## 2019-01-31 DIAGNOSIS — I10 Essential (primary) hypertension: Secondary | ICD-10-CM | POA: Insufficient documentation

## 2019-01-31 DIAGNOSIS — Z85038 Personal history of other malignant neoplasm of large intestine: Secondary | ICD-10-CM | POA: Diagnosis not present

## 2019-01-31 DIAGNOSIS — Z853 Personal history of malignant neoplasm of breast: Secondary | ICD-10-CM | POA: Insufficient documentation

## 2019-01-31 DIAGNOSIS — S81812A Laceration without foreign body, left lower leg, initial encounter: Secondary | ICD-10-CM | POA: Diagnosis present

## 2019-01-31 DIAGNOSIS — W228XXA Striking against or struck by other objects, initial encounter: Secondary | ICD-10-CM | POA: Diagnosis not present

## 2019-01-31 DIAGNOSIS — L97822 Non-pressure chronic ulcer of other part of left lower leg with fat layer exposed: Secondary | ICD-10-CM | POA: Diagnosis not present

## 2019-01-31 DIAGNOSIS — I872 Venous insufficiency (chronic) (peripheral): Secondary | ICD-10-CM | POA: Insufficient documentation

## 2019-01-31 NOTE — Progress Notes (Addendum)
CARLYN, DESHLER ([id number]) Visit Report for 01/31/2019 Arrival Information Details Patient Name: [name], [name] Date of Service: 01/31/2019 2:45 PM Medical Record Number: [id number] Patient Account Number: [id number] Date of Birth/Sex: 1921/02/19 (85 y.[name]. F) Treating RN: [name] Primary Care [name]: [name] Other Clinician: Referring [name]: [name] Treating [name]/Extender: [name] in Treatment: 7 Visit Information History Since Last Visit Added or deleted any medications: No Patient Arrived: [name] Any new allergies or adverse reactions: No Arrival Time: 14:42 Had a fall or experienced change in No Accompanied By: daughter activities of daily living that may affect Transfer Assistance: None risk of falls: Patient Requires Transmission-Based Precautions: No Signs or symptoms of abuse/neglect since last visito No Patient Has Alerts: No Hospitalized since last visit: No Has Dressing in Place as Prescribed: Yes Pain Present Now: No Electronic Signature(s) Signed: 01/31/2019 2:53:02 PM By: [name] Entered By: [name] on 01/31/2019 14:43:00 [name], [name] ([id number]) -------------------------------------------------------------------------------- Encounter Discharge Information Details Patient Name: Sandra Pair B. Date of Service: 01/31/2019 2:45 PM Medical Record Number: [id number] Patient Account Number: [id number] Date of Birth/Sex: August 02, 1920 (85 y.[name]. F) Treating RN: [name] Primary Care Cotton Beckley: [name] Other Clinician: Referring Tyishia Aune: [name] Treating Angalena Cousineau/Extender: [name] in Treatment: 7 Encounter Discharge Information Items Post Procedure Vitals Discharge Condition: Stable Temperature (F): 98.7 Ambulatory Status: Cane Pulse (bpm): 57 Discharge Destination: Home Respiratory Rate (breaths/min): 16 Transportation: Private Auto Blood Pressure (mmHg): 181/91 Accompanied By:  daughter Schedule Follow-up Appointment: Yes Clinical Summary of Care: Electronic Signature(s) Signed: 01/31/2019 4:03:55 PM By: [name] Entered By: [name] on 01/31/2019 15:01:23 [name], [name] ([id number]) -------------------------------------------------------------------------------- Lower Extremity Assessment Details Patient Name: [name], [name] Date of Service: 01/31/2019 2:45 PM Medical Record Number: [id number] Patient Account Number: [id number] Date of Birth/Sex: April 03, 1921 (85 y.[name]. F) Treating RN: [name] Primary Care [name]: [name] Other Clinician: Referring [name]: [name] Treating [name] Weeks in Treatment: 7 Edema Assessment Assessed: [Left: No] [Right: No] Edema: [Left: N] [Right: [name]] Vascular Assessment Pulses: Dorsalis Pedis Palpable: [Left:Yes] Electronic Signature(s) Signed: 01/31/2019 2:53:02 PM By: [name] Entered By: [name] on 01/31/2019 14:48:02 [name], [name] ([id number]) -------------------------------------------------------------------------------- Multi Wound Chart Details Patient Name: [name], [name] Date of Service: 01/31/2019 2:45 PM Medical Record Number: [id number] Patient Account Number: [id number] Date of Birth/Sex: [birth year] (85 y.[name]. F) Treating RN: [name] Primary Care Colletta Spillers: [name] Other Clinician: Referring [name]: [name] Treating [name] Weeks in Treatment: 7 Vital Signs Height(in): 64 Pulse(bpm): 87 Weight(lbs): 148 Blood Pressure(mmHg): 181/91 Body Mass Index(BMI): 25 Temperature(F): 98.7 Respiratory Rate 16 (breaths/min): Photos: [N/A:N/A] Wound Location: Left Lower Leg - Anterior N/A N/A Wounding Event: Other Lesion N/A N/A Primary Etiology: Skin Tear N/A N/A Comorbid History: Cataracts, Hypertension N/A N/A Date Acquired: 11/06/2018 N/A N/A Weeks of Treatment: 7 N/A N/A Wound Status: Open  N/A N/A Measurements L x W x D 3.5x1.3x0.1 N/A N/A (cm) Area (cm) : 3.574 N/A N/A Volume (cm) : 0.357 N/A N/A % Reduction in Area: 78.90% N/A N/A % Reduction in Volume: 79.00% N/A N/A Classification: Full Thickness Without N/A N/A Exposed Support Structures Exudate Amount: Medium N/A N/A Exudate Type: Serosanguineous N/A N/A Exudate Color: red, brown N/A N/A Wound Margin: Indistinct, nonvisible N/A N/A Granulation Amount: Large (67-100%) N/A N/A Granulation Quality: Red, Hyper-granulation N/A N/A Necrotic Amount: Small (1-33%) N/A N/A Exposed Structures: Fat Layer (Subcutaneous N/A N/A Tissue) Exposed: Yes Fascia: No Tendon: No Muscle:  No Joint: No Bone: No [name], [name] ([id number]) Epithelialization: None N/A N/A Treatment Notes Electronic Signature(s) Signed: 01/31/2019 4:03:55 PM By: [name] Entered By: [name] on 01/31/2019 14:57:11 [name] ([id number]) -------------------------------------------------------------------------------- [place] Details Patient Name: [name], [name] Date of Service: 01/31/2019 2:45 PM Medical Record Number: [id number] Patient Account Number: [id number] Date of Birth/Sex: [birth year] (85 y.[name]. F) Treating RN: [name] Primary Care [name]: [name] Other Clinician: Referring [name]: [name] Treating Shariq Puig/Extender: [name] in Treatment: 7 Active Inactive Necrotic Tissue Nursing Diagnoses: Impaired tissue integrity related to necrotic/devitalized tissue Goals: Necrotic/devitalized tissue will be minimized in the wound bed Date Initiated: 12/19/2018 Target Resolution Date: 12/25/2018 Goal Status: Active Interventions: Assess patient pain level pre-, during and post procedure and prior to discharge Treatment Activities: Enzymatic debridement : 12/19/2018 Notes: Orientation to the Wound Care Program Nursing Diagnoses: Knowledge deficit related to the wound  healing center program Goals: Patient/caregiver will verbalize understanding of the [place] Date Initiated: 12/19/2018 Target Resolution Date: 12/25/2018 Goal Status: Active Interventions: Provide education on orientation to the wound center Notes: Pain, Acute or Chronic Nursing Diagnoses: Potential alteration in comfort, pain Goals: Patient will verbalize adequate pain control and receive pain control interventions during procedures as needed Date Initiated: 12/19/2018 Target Resolution Date: 12/25/2018 Goal Status: Active [name], [name] ([id number]) Interventions: Provide education on pain management Notes: Wound/Skin Impairment Nursing Diagnoses: Impaired tissue integrity Goals: Patient/caregiver will verbalize understanding of skin care regimen Date Initiated: 12/19/2018 Target Resolution Date: 12/25/2018 Goal Status: Active Interventions: Assess ulceration(s) every visit Treatment Activities: Skin care regimen initiated : 12/19/2018 Notes: Electronic Signature(s) Signed: 01/31/2019 4:03:55 PM By: [name] Entered By: [name] on 01/31/2019 14:57:01 [name], [name] ([id number]) -------------------------------------------------------------------------------- Pain Assessment Details Patient Name: Sandra Pair B. Date of Service: 01/31/2019 2:45 PM Medical Record Number: [id number] Patient Account Number: [id number] Date of Birth/Sex: December 20, 1920 (85 y.[name]. F) Treating RN: [name] Primary Care Ulrick Methot: [name] Other Clinician: Referring Jani Moronta: [name] Treating Avigayil Ton/Extender: [name] in Treatment: 7 Active Problems Location of Pain Severity and Description of Pain Patient Has Paino No Site Locations Pain Management and Medication Current Pain Management: Electronic Signature(s) Signed: 01/31/2019 2:53:02 PM By: [name] Entered By: [name] on 01/31/2019 14:43:05 Faniel, [name]  ([id number]) -------------------------------------------------------------------------------- Patient/Caregiver Education Details Patient Name: Sandra Pair B. Date of Service: 01/31/2019 2:45 PM Medical Record Number: [id number] Patient Account Number: [id number] Date of Birth/Gender: 1920/09/30 (26 y.[name]. F) Treating RN: [name] Primary Care Physician: [name] Other Clinician: Referring Physician: [place] Weeks Treating Physician/Extender: Sharalyn Ink in Treatment: 7 Education Assessment Education Provided To: Patient and Caregiver Education Topics Provided Wound/Skin Impairment: Handouts: Other: wound care as ordered Methods: Demonstration, Explain/Verbal Responses: State content correctly Electronic Signature(s) Signed: 01/31/2019 4:03:55 PM By: [name] Entered By: [name] on 01/31/2019 15:00:19 [name], [name] ([id number]) -------------------------------------------------------------------------------- Wound Assessment Details Patient Name: [name], [name] Date of Service: 01/31/2019 2:45 PM Medical Record Number: [id number] Patient Account Number: [id number] Date of Birth/Sex: [birth year] (85 y.[name]. F) Treating RN: [name] Primary Care Teneshia Hedeen: [name] Other Clinician: Referring Suriya Kovarik: [name] Treating Sydna Brodowski/Extender: [name] in Treatment: 7 Wound Status Wound Number: 1 Primary Etiology: Skin Tear Wound Location: Left Lower Leg - Anterior Wound Status: Open Wounding Event: Other Lesion Comorbid History: Cataracts, Hypertension Date Acquired: 11/06/2018 Weeks Of Treatment: 7 Clustered Wound: No Photos Wound Measurements Length: (cm) 3.5 Width: (cm) 1.3 Depth: (  cm) 0.1 Area: (cm) 3.574 Volume: (cm) 0.357 % Reduction in Area: 78.9% % Reduction in Volume: 79% Epithelialization: None Tunneling: No Undermining: No Wound Description Full Thickness Without Exposed Support Foul  Odo Classification: Structures Slough/F Wound Margin: Indistinct, nonvisible Exudate Medium Amount: Exudate Type: Serosanguineous Exudate Color: red, brown r After Cleansing: No ibrino Yes Wound Bed Granulation Amount: Large (67-100%) Exposed Structure Granulation Quality: Red, Hyper-granulation Fascia Exposed: No Necrotic Amount: Small (1-33%) Fat Layer (Subcutaneous Tissue) Exposed: Yes Necrotic Quality: Adherent Slough Tendon Exposed: No Muscle Exposed: No Joint Exposed: No Bone Exposed: No Myrie, Arthea B. ([id number]) Treatment Notes Wound #1 (Left, Anterior Lower Leg) Notes hydrafera blue, abd and conform, tubigrip G Electronic Signature(s) Signed: 01/31/2019 2:53:02 PM By: [name] Entered By: [name] on 01/31/2019 14:47:28 Gielow, [name] ([id number]) -------------------------------------------------------------------------------- Vitals Details Patient Name: Sandra Pair B. Date of Service: 01/31/2019 2:45 PM Medical Record Number: [id number] Patient Account Number: [id number] Date of Birth/Sex: June 12, 1920 (85 y.[name]. F) Treating RN: [name] Primary Care Elspeth Blucher: [name] Other Clinician: Referring Serenidy Waltz: [name] Treating Imari Reen/Extender: [name] in Treatment: 7 Vital Signs Time Taken: 14:43 Temperature (F): 98.7 Height (in): 64 Pulse (bpm): 57 Weight (lbs): 148 Respiratory Rate (breaths/min): 16 Body Mass Index (BMI): 25.4 Blood Pressure (mmHg): 181/91 Reference Range: 80 - 120 mg / dl Electronic Signature(s) Signed: 01/31/2019 2:53:02 PM By: [name] Entered By: [name] on 01/31/2019 14:43:46

## 2019-01-31 NOTE — Progress Notes (Addendum)
BEATRICE, MIRE ([id number]) Visit Report for 01/31/2019 Chief Complaint Document Details Patient Name: [name], [name] Date of Service: 01/31/2019 2:45 PM Medical Record Number: [id number] Patient Account Number: [id number] Date of Birth/Sex: [birth year] (85 y.o. F) Treating RN: Montey Hora Primary Care Provider: [name] Other Clinician: Referring Provider: [name] Treating Provider/Extender: Melburn Hake, Jeannetta Cerutti Weeks in Treatment: 7 Information Obtained from: Patient Chief Complaint Left leg wound, referred by her Surgeon Electronic Signature(s) Signed: 01/31/2019 2:55:29 PM By: Worthy Keeler PA-C Entered By: Worthy Keeler on 01/31/2019 14:55:28 [name], [name] ([id number]) -------------------------------------------------------------------------------- Debridement Details Patient Name: Sandra Pair B. Date of Service: 01/31/2019 2:45 PM Medical Record Number: [id number] Patient Account Number: [id number] Date of Birth/Sex: June 07, 1920 (85 y.o. F) Treating RN: Montey Hora Primary Care Provider: [name] Other Clinician: Referring Provider: [name] Treating Provider/Extender: Melburn Hake, Birt Reinoso Weeks in Treatment: 7 Debridement Performed for Wound #1 Left,Anterior Lower Leg Assessment: Performed By: Physician STONE III, Aboubacar Matsuo E., PA-C Debridement Type: Debridement Level of Consciousness (Pre- Awake and Alert procedure): Pre-procedure Verification/Time Yes - 14:57 Out Taken: Start Time: 14:57 Pain Control: Lidocaine 4% Topical Solution Total Area Debrided (L x W): 3.5 (cm) x 1.3 (cm) = 4.55 (cm) Tissue and other material Viable, Non-Viable, Slough, Subcutaneous, Slough debrided: Level: Skin/Subcutaneous Tissue Debridement Description: Excisional Instrument: Curette Bleeding: Minimum Hemostasis Achieved: Pressure End Time: 14:59 Procedural Pain: 0 Post Procedural Pain: 0 Response to Treatment: Procedure was tolerated well Level of  Consciousness Awake and Alert (Post-procedure): Post Debridement Measurements of Total Wound Length: (cm) 3.5 Width: (cm) 1.3 Depth: (cm) 0.2 Volume: (cm) 0.715 Character of Wound/Ulcer Post Debridement: Improved Post Procedure Diagnosis Same as Pre-procedure Electronic Signature(s) Signed: 01/31/2019 4:03:55 PM By: Montey Hora Signed: 01/31/2019 6:57:28 PM By: Worthy Keeler PA-C Entered By: Montey Hora on 01/31/2019 14:59:28 [name], [name] ([id number]) -------------------------------------------------------------------------------- HPI Details Patient Name: Sandra Pair B. Date of Service: 01/31/2019 2:45 PM Medical Record Number: [id number] Patient Account Number: [id number] Date of Birth/Sex: 1921/04/22 (85 y.o. F) Treating RN: Montey Hora Primary Care Provider: [name] Other Clinician: Referring Provider: [name] Treating Provider/Extender: Melburn Hake, Anyely Cunning Weeks in Treatment: 7 History of Present Illness HPI Description: 85 year old female who developed a left anterior leg skin tear more than a month ago with a flap on it, this first started out when family were helping her put on a pair of pants and the edge of the pant leg scraped and pulled off in the area of skin on the left anterior shin. She was seen at an urgent care for the skin tear where the used a scissors to cut the loose lying skin flap on top of the wound. To give her course of antibiotics which I believe was for 10 days that she completed almost a month ago. Since then the wound had been scabbed but she experienced a lot of pain in this area family has been using Vaseline on this area after using Neosporin for some time and she is referred to the clinic for further care of the wound with scab on it. Patient denies any fevers chills shakes. She is ambulatory and has family assist her when needed. Patient is at her baseline in terms of her activity level and functional status other than  experiencing some pain in the left leg around the scab. ABIs were not obtained today on the left on account of significant pain associated with the scabbing wound Patient has history of breast cancer status post mastectomy in remission, colon  cancer status post colectomy in remission, iron deficiency for which she gets iron infusions, hypertension, she is not on any blood thinners 12/19/18 on evaluation today patient actually appears to be redoing okay in regard to the skin tear on the left anterior lower extremity. With that being said this is still showing signs of a significant amount of necrotic tissue on the surface of the wound unfortunately. She's been "trying to let this scab" which obviously is not the right thing at this point. I do think that cental in keeping this area covered is gonna be the ideal way to get this to heal most appropriately. Obviously I explained to the patient as well as her son who was present during the evaluation today that we really need to try and get the necrotic tissue off of the surface of the wound in order to let this heal appropriately. Fortunately there's no signs of active infection. 12/26/2018 upon evaluation today patient appears to be doing better with regard to her left lower extremity ulcer. She has been tolerating the dressing changes specifically with the Santyl without complication. Fortunately there is no signs of active infection at this time. No fevers, chills, nausea, vomiting, or diarrhea. The dry dark eschar is loosening up and does seem to be doing much better today which is great news. Overall I am pleased with the progress. 01/02/19 on evaluation today patient actually appears to be doing excellent in regard to her lower extremity ulcer. She's been tolerating the dressing changes without complication. Fortunately the slough is loosening up and I do believe that the Santyl has been very beneficial in this regard. With that being said I did take  almost a week for them to get the medication from the pharmacy [facility] seems to take forever to get this in stock. Other than that I'm overall pleased with how things again are appearing today. 01/09/2019 upon evaluation today patient appears to be doing quite well with regard to her left anterior lower extremity ulcer. She has been tolerating the dressing changes without complication. Fortunately there is no signs of active infection at this time. She does have some slough noted on the surface of the wound but overall this appears to be doing much better in my opinion. 01/16/2019 on evaluation today patient appears to be doing well with regard to her left lower extremity ulcer. She has been tolerating the dressing changes without complication and very pleased with the progress that she seems to be making. Fortunately there is no signs of active infection at this time. I do believe it may be time to switch to a different dressing I am thinking a Hydrofera Blue dressing would be beneficial. 01/23/19 on evaluation today patient appears to be doing excellent in regard to her left lower Trinity ulcer. She's been tolerating the dressing changes without complication. Fortunately there is no sign of active infection at this time. She's also having no significant pain which is excellent news. 01/31/19 on evaluation today patient's wound bed actually showed signs of excellent improvement at this time. There was a Reppert, Asucena B. ([id number]) much smaller measurement noted. She does have issues with some slough noted at this point which did require sharp debridement post debridement the wound bed appears to be doing much better which is great news. Overall I am extremely happy with the progress she seems to be making. The patient is likewise pleased as is her family member. Electronic Signature(s) Signed: 01/31/2019 6:12:00 PM By: Worthy Keeler PA-C Previous  Signature: 01/31/2019 6:11:36 PM Version By: Worthy Keeler PA-C Entered By: Worthy Keeler on 01/31/2019 18:12:00 [name], [name] ([id number]) -------------------------------------------------------------------------------- Physical Exam Details Patient Name: [name], [name] Date of Service: 01/31/2019 2:45 PM Medical Record Number: [id number] Patient Account Number: [id number] Date of Birth/Sex: [birth year] (85 y.o. F) Treating RN: Montey Hora Primary Care Provider: [name] Other Clinician: Referring Provider: [name] Treating Provider/Extender: Melburn Hake, Cobie Leidner Weeks in Treatment: 7 Constitutional Well-nourished and well-hydrated in no acute distress. Respiratory normal breathing without difficulty. Psychiatric this patient is able to make decisions and demonstrates good insight into disease process. Alert and Oriented x 3. pleasant and cooperative. Notes Upon inspection patient appears to be doing very well at this time with excellent granulation. Her wound did require some sharp debridement which I was able to performed today without complication post debridement the wound bed appears to be doing much better. Electronic Signature(s) Signed: 01/31/2019 6:12:33 PM By: Worthy Keeler PA-C Entered By: Worthy Keeler on 01/31/2019 18:12:33 Currey, [name] ([id number]) -------------------------------------------------------------------------------- Physician Orders Details Patient Name: Sandra Pair B. Date of Service: 01/31/2019 2:45 PM Medical Record Number: [id number] Patient Account Number: [id number] Date of Birth/Sex: Jul 12, 1920 (85 y.o. F) Treating RN: Montey Hora Primary Care Provider: [name] Other Clinician: Referring Provider: [name] Treating Provider/Extender: Melburn Hake, Iver Fehrenbach Weeks in Treatment: 7 Verbal / Phone Orders: No Diagnosis Coding ICD-10 Coding Code Description I87.2 Venous insufficiency (chronic) (peripheral) L97.822 Non-pressure chronic ulcer of other part of left lower  leg with fat layer exposed Wound Cleansing Wound #1 Left,Anterior Lower Leg o Dial antibacterial soap, wash wounds, rinse and pat dry prior to dressing wounds o May Shower, gently pat wound dry prior to applying new dressing. Primary Wound Dressing Wound #1 Left,Anterior Lower Leg o Hydrafera Blue Ready Transfer Secondary Dressing Wound #1 Left,Anterior Lower Leg o ABD and Kerlix/Conform Dressing Change Frequency Wound #1 Left,Anterior Lower Leg o Change dressing every day. Follow-up Appointments o Return Appointment in 1 week. Edema Control Wound #1 Left,Anterior Lower Leg o Other: - Tubigrip G Electronic Signature(s) Signed: 01/31/2019 4:03:55 PM By: Montey Hora Signed: 01/31/2019 6:57:28 PM By: Worthy Keeler PA-C Entered By: Montey Hora on 01/31/2019 14:59:57 [name], [name] ([id number]) -------------------------------------------------------------------------------- Problem List Details Patient Name: Livesey, Chloeann B. Date of Service: 01/31/2019 2:45 PM Medical Record Number: [id number] Patient Account Number: [id number] Date of Birth/Sex: 1921/05/03 (85 y.o. F) Treating RN: Montey Hora Primary Care Provider: [name] Other Clinician: Referring Provider: [name] Treating Provider/Extender: Melburn Hake, Maxwell Lemen Weeks in Treatment: 7 Active Problems ICD-10 Evaluated Encounter Code Description Active Date Today Diagnosis I87.2 Venous insufficiency (chronic) (peripheral) 12/10/2018 No Yes L97.822 Non-pressure chronic ulcer of other part of left lower leg with 12/10/2018 No Yes fat layer exposed Inactive Problems Resolved Problems Electronic Signature(s) Signed: 01/31/2019 2:55:19 PM By: Worthy Keeler PA-C Entered By: Worthy Keeler on 01/31/2019 14:55:18 [name], [name] ([id number]) -------------------------------------------------------------------------------- Progress Note Details Patient Name: [name], [name] Date of Service: 01/31/2019  2:45 PM Medical Record Number: [id number] Patient Account Number: [id number] Date of Birth/Sex: [birth year] (85 y.o. F) Treating RN: Montey Hora Primary Care Provider: [name] Other Clinician: Referring Provider: [name] Treating Provider/Extender: Melburn Hake, Jaquille Kau Weeks in Treatment: 7 Subjective Chief Complaint Information obtained from Patient Left leg wound, referred by her Surgeon History of Present Illness (HPI) 85 year old female who developed a left anterior leg skin tear more than a month ago with a flap on it, this first started out  when family were helping her put on a pair of pants and the edge of the pant leg scraped and pulled off in the area of skin on the left anterior shin. She was seen at an urgent care for the skin tear where the used a scissors to cut the loose lying skin flap on top of the wound. To give her course of antibiotics which I believe was for 10 days that she completed almost a month ago. Since then the wound had been scabbed but she experienced a lot of pain in this area family has been using Vaseline on this area after using Neosporin for some time and she is referred to the clinic for further care of the wound with scab on it. Patient denies any fevers chills shakes. She is ambulatory and has family assist her when needed. Patient is at her baseline in terms of her activity level and functional status other than experiencing some pain in the left leg around the scab. ABIs were not obtained today on the left on account of significant pain associated with the scabbing wound Patient has history of breast cancer status post mastectomy in remission, colon cancer status post colectomy in remission, iron deficiency for which she gets iron infusions, hypertension, she is not on any blood thinners 12/19/18 on evaluation today patient actually appears to be redoing okay in regard to the skin tear on the left anterior lower extremity. With that being said  this is still showing signs of a significant amount of necrotic tissue on the surface of the wound unfortunately. She's been "trying to let this scab" which obviously is not the right thing at this point. I do think that cental in keeping this area covered is gonna be the ideal way to get this to heal most appropriately. Obviously I explained to the patient as well as her son who was present during the evaluation today that we really need to try and get the necrotic tissue off of the surface of the wound in order to let this heal appropriately. Fortunately there's no signs of active infection. 12/26/2018 upon evaluation today patient appears to be doing better with regard to her left lower extremity ulcer. She has been tolerating the dressing changes specifically with the Santyl without complication. Fortunately there is no signs of active infection at this time. No fevers, chills, nausea, vomiting, or diarrhea. The dry dark eschar is loosening up and does seem to be doing much better today which is great news. Overall I am pleased with the progress. 01/02/19 on evaluation today patient actually appears to be doing excellent in regard to her lower extremity ulcer. She's been tolerating the dressing changes without complication. Fortunately the slough is loosening up and I do believe that the Santyl has been very beneficial in this regard. With that being said I did take almost a week for them to get the medication from the pharmacy [facility] seems to take forever to get this in stock. Other than that I'm overall pleased with how things again are appearing today. 01/09/2019 upon evaluation today patient appears to be doing quite well with regard to her left anterior lower extremity ulcer. She has been tolerating the dressing changes without complication. Fortunately there is no signs of active infection at this time. She does have some slough noted on the surface of the wound but overall this appears to be  doing much better in my opinion. 01/16/2019 on evaluation today patient appears to be doing well with regard  to her left lower extremity ulcer. She has been tolerating the dressing changes without complication and very pleased with the progress that she seems to be making. Fortunately there is no signs of active infection at this time. I do believe it may be time to switch to a different dressing I am thinking a Hydrofera Blue dressing would be beneficial. [name], [name] ([id number]) 01/23/19 on evaluation today patient appears to be doing excellent in regard to her left lower Trinity ulcer. She's been tolerating the dressing changes without complication. Fortunately there is no sign of active infection at this time. She's also having no significant pain which is excellent news. 01/31/19 on evaluation today patient's wound bed actually showed signs of excellent improvement at this time. There was a much smaller measurement noted. She does have issues with some slough noted at this point which did require sharp debridement post debridement the wound bed appears to be doing much better which is great news. Overall I am extremely happy with the progress she seems to be making. The patient is likewise pleased as is her family member. Patient History Information obtained from Patient. Family History Cancer - Father,Mother,Siblings, No family history of Diabetes, Heart Disease, Hereditary Spherocytosis, Hypertension, Kidney Disease, Lung Disease, Seizures, Stroke, Thyroid Problems, Tuberculosis. Social History Never smoker, Marital Status - Widowed, Alcohol Use - Never, Drug Use - No History, Caffeine Use - Daily - coffee. Medical History Eyes Patient has history of Cataracts - both Hematologic/Lymphatic Denies history of Anemia, Hemophilia, Human Immunodeficiency Virus, Lymphedema, Sickle Cell Disease Respiratory Denies history of Aspiration, Asthma, Chronic Obstructive Pulmonary Disease (COPD),  Pneumothorax, Sleep Apnea, Tuberculosis Cardiovascular Patient has history of Hypertension Denies history of Angina, Arrhythmia, Congestive Heart Failure, Coronary Artery Disease, Deep Vein Thrombosis, Hypotension, Myocardial Infarction, Peripheral Arterial Disease, Peripheral Venous Disease, Phlebitis, Vasculitis Gastrointestinal Denies history of Cirrhosis , Colitis, Crohn s, Hepatitis A, Hepatitis B, Hepatitis C Endocrine Denies history of Type I Diabetes, Type II Diabetes Genitourinary Denies history of End Stage Renal Disease Immunological Denies history of Lupus Erythematosus, Raynaud s, Scleroderma Integumentary (Skin) Denies history of History of Burn, History of pressure wounds Musculoskeletal Denies history of Gout, Rheumatoid Arthritis, Osteoarthritis Neurologic Denies history of Dementia, Neuropathy, Quadriplegia, Paraplegia, Seizure Disorder Oncologic Denies history of Received Chemotherapy Psychiatric Denies history of Anorexia/bulimia, Confinement Anxiety Medical And Surgical History Notes Gastrointestinal ulcer issues Review of Systems (ROS) Constitutional Symptoms (General Health) Denies complaints or symptoms of Fatigue, Fever, Chills, Marked Weight Change. Respiratory [name], [name] ([id number]) Denies complaints or symptoms of Chronic or frequent coughs, Shortness of Breath. Cardiovascular Complains or has symptoms of LE edema. Denies complaints or symptoms of Chest pain. Psychiatric Denies complaints or symptoms of Anxiety, Claustrophobia. Objective Constitutional Well-nourished and well-hydrated in no acute distress. Vitals Time Taken: 2:43 PM, Height: 64 in, Weight: 148 lbs, BMI: 25.4, Temperature: 98.7 F, Pulse: 57 bpm, Respiratory Rate: 16 breaths/min, Blood Pressure: 181/91 mmHg. Respiratory normal breathing without difficulty. Psychiatric this patient is able to make decisions and demonstrates good insight into disease process. Alert and  Oriented x 3. pleasant and cooperative. General Notes: Upon inspection patient appears to be doing very well at this time with excellent granulation. Her wound did require some sharp debridement which I was able to performed today without complication post debridement the wound bed appears to be doing much better. Integumentary (Hair, Skin) Wound #1 status is Open. Original cause of wound was Other Lesion. The wound is located on the Left,Anterior Lower Leg. The wound measures  3.5cm length x 1.3cm width x 0.1cm depth; 3.574cm^2 area and 0.357cm^3 volume. There is Fat Layer (Subcutaneous Tissue) Exposed exposed. There is no tunneling or undermining noted. There is a medium amount of serosanguineous drainage noted. The wound margin is indistinct and nonvisible. There is large (67-100%) red, hyper - granulation within the wound bed. There is a small (1-33%) amount of necrotic tissue within the wound bed including Adherent Slough. Assessment Active Problems ICD-10 Venous insufficiency (chronic) (peripheral) Non-pressure chronic ulcer of other part of left lower leg with fat layer exposed [name], [name] ([id number]) Procedures Wound #1 Pre-procedure diagnosis of Wound #1 is a Skin Tear located on the Left,Anterior Lower Leg . There was a Excisional Skin/Subcutaneous Tissue Debridement with a total area of 4.55 sq cm performed by STONE III, Leng Montesdeoca E., PA-C. With the following instrument(s): Curette to remove Viable and Non-Viable tissue/material. Material removed includes Subcutaneous Tissue and Slough and after achieving pain control using Lidocaine 4% Topical Solution. No specimens were taken. A time out was conducted at 14:57, prior to the start of the procedure. A Minimum amount of bleeding was controlled with Pressure. The procedure was tolerated well with a pain level of 0 throughout and a pain level of 0 following the procedure. Post Debridement Measurements: 3.5cm length x 1.3cm width x  0.2cm depth; 0.715cm^3 volume. Character of Wound/Ulcer Post Debridement is improved. Post procedure Diagnosis Wound #1: Same as Pre-Procedure Plan Wound Cleansing: Wound #1 Left,Anterior Lower Leg: Dial antibacterial soap, wash wounds, rinse and pat dry prior to dressing wounds May Shower, gently pat wound dry prior to applying new dressing. Primary Wound Dressing: Wound #1 Left,Anterior Lower Leg: Hydrafera Blue Ready Transfer Secondary Dressing: Wound #1 Left,Anterior Lower Leg: ABD and Kerlix/Conform Dressing Change Frequency: Wound #1 Left,Anterior Lower Leg: Change dressing every day. Follow-up Appointments: Return Appointment in 1 week. Edema Control: Wound #1 Left,Anterior Lower Leg: Other: - Tubigrip G 1. I would recommend that we continue with the above wound care measures for the next week and the patient is in agreement with plan. 2. I am also going to suggest that we go ahead and continue with the [facility] dressing specifically which I think is doing a great job for her. 3. I think the Tubigrip is still helping with her swelling I would recommend that we continue with that as well. We will see patient back for reevaluation in 1 week here in the clinic. If anything worsens or changes patient will contact our office for additional recommendations. Electronic Signature(s) Signed: 01/31/2019 6:13:12 PM By: Marlynn Perking, Rayle ([id number]) Entered By: Worthy Keeler on 01/31/2019 18:13:12 [name], [name] ([id number]) -------------------------------------------------------------------------------- ROS/PFSH Details Patient Name: Sandra Pair B. Date of Service: 01/31/2019 2:45 PM Medical Record Number: [id number] Patient Account Number: [id number] Date of Birth/Sex: 04/09/1921 (85 y.o. F) Treating RN: Montey Hora Primary Care Provider: [name] Other Clinician: Referring Provider: [name] Treating Provider/Extender: Melburn Hake,  Zenda Herskowitz Weeks in Treatment: 7 Information Obtained From Patient Constitutional Symptoms (General Health) Complaints and Symptoms: Negative for: Fatigue; Fever; Chills; Marked Weight Change Respiratory Complaints and Symptoms: Negative for: Chronic or frequent coughs; Shortness of Breath Medical History: Negative for: Aspiration; Asthma; Chronic Obstructive Pulmonary Disease (COPD); Pneumothorax; Sleep Apnea; Tuberculosis Cardiovascular Complaints and Symptoms: Positive for: LE edema Negative for: Chest pain Medical History: Positive for: Hypertension Negative for: Angina; Arrhythmia; Congestive Heart Failure; Coronary Artery Disease; Deep Vein Thrombosis; Hypotension; Myocardial Infarction; Peripheral Arterial Disease; Peripheral Venous Disease; Phlebitis; Vasculitis Psychiatric  Complaints and Symptoms: Negative for: Anxiety; Claustrophobia Medical History: Negative for: Anorexia/bulimia; Confinement Anxiety Eyes Medical History: Positive for: Cataracts - both Hematologic/Lymphatic Medical History: Negative for: Anemia; Hemophilia; Human Immunodeficiency Virus; Lymphedema; Sickle Cell Disease Gastrointestinal Medical History: Negative for: Cirrhosis ; Colitis; Crohnos; Hepatitis A; Hepatitis B; Hepatitis C Past Medical History Notes: [name], [place] ([id number]) ulcer issues Endocrine Medical History: Negative for: Type I Diabetes; Type II Diabetes Genitourinary Medical History: Negative for: End Stage Renal Disease Immunological Medical History: Negative for: Lupus Erythematosus; Raynaudos; Scleroderma Integumentary (Skin) Medical History: Negative for: History of Burn; History of pressure wounds Musculoskeletal Medical History: Negative for: Gout; Rheumatoid Arthritis; Osteoarthritis Neurologic Medical History: Negative for: Dementia; Neuropathy; Quadriplegia; Paraplegia; Seizure Disorder Oncologic Medical History: Negative for: Received Chemotherapy HBO  Extended History Items Eyes: Cataracts Immunizations Pneumococcal Vaccine: Received Pneumococcal Vaccination: Yes Implantable Devices None Family and Social History Cancer: Yes - Father,Mother,Siblings; Diabetes: No; Heart Disease: No; Hereditary Spherocytosis: No; Hypertension: No; Kidney Disease: No; Lung Disease: No; Seizures: No; Stroke: No; Thyroid Problems: No; Tuberculosis: No; Never smoker; Marital Status - Widowed; Alcohol Use: Never; Drug Use: No History; Caffeine Use: Daily - coffee; Financial Concerns: No; Food, Clothing or Shelter Needs: No; Support System Lacking: No; Transportation Concerns: No Physician Affirmation I have reviewed and agree with the above information. Electronic Signature(s) DAMIEN, SCHEPIS ([id number]) Signed: 01/31/2019 6:57:28 PM By: Worthy Keeler PA-C Signed: 02/04/2019 4:33:06 PM By: Montey Hora Entered By: Worthy Keeler on 01/31/2019 18:12:22 [name], [name] ([id number]) -------------------------------------------------------------------------------- SuperBill Details Patient Name: Sandra Pair B. Date of Service: 01/31/2019 Medical Record Number: [id number] Patient Account Number: [id number] Date of Birth/Sex: [birth year] (85 y.o. F) Treating RN: Montey Hora Primary Care Provider: [name] Other Clinician: Referring Provider: [name] Treating Provider/Extender: Melburn Hake, Casy Brunetto Weeks in Treatment: 7 Diagnosis Coding ICD-10 Codes Code Description I87.2 Venous insufficiency (chronic) (peripheral) L97.822 Non-pressure chronic ulcer of other part of left lower leg with fat layer exposed Facility Procedures CPT4 Code Description: [id number] - DEB SUBQ TISSUE 20 SQ CM/< ICD-10 Diagnosis Description L97.822 Non-pressure chronic ulcer of other part of left lower leg with Modifier: fat layer expos Quantity: 1 ed Physician Procedures CPT4 Code Description: [id number] 11042 - WC PHYS SUBQ TISS 20 SQ CM ICD-10 Diagnosis Description  L97.822 Non-pressure chronic ulcer of other part of left lower leg with Modifier: fat layer expos Quantity: 1 ed Electronic Signature(s) Signed: 01/31/2019 6:19:26 PM By: Worthy Keeler PA-C Entered By: Worthy Keeler on 01/31/2019 18:19:26

## 2019-02-07 ENCOUNTER — Ambulatory Visit: Payer: Medicare Other | Admitting: Physician Assistant

## 2019-02-11 ENCOUNTER — Other Ambulatory Visit: Payer: Self-pay

## 2019-02-11 ENCOUNTER — Inpatient Hospital Stay: Payer: Medicare Other | Attending: Oncology

## 2019-02-11 DIAGNOSIS — D509 Iron deficiency anemia, unspecified: Secondary | ICD-10-CM

## 2019-02-11 LAB — IRON AND TIBC
Iron: 94 ug/dL (ref 28–170)
Saturation Ratios: 31 % (ref 10.4–31.8)
TIBC: 301 ug/dL (ref 250–450)
UIBC: 207 ug/dL

## 2019-02-11 LAB — CBC
HCT: 44.4 % (ref 36.0–46.0)
Hemoglobin: 14.5 g/dL (ref 12.0–15.0)
MCH: 31.7 pg (ref 26.0–34.0)
MCHC: 32.7 g/dL (ref 30.0–36.0)
MCV: 96.9 fL (ref 80.0–100.0)
Platelets: 301 10*3/uL (ref 150–400)
RBC: 4.58 MIL/uL (ref 3.87–5.11)
RDW: 13.7 % (ref 11.5–15.5)
WBC: 8.2 10*3/uL (ref 4.0–10.5)
nRBC: 0 % (ref 0.0–0.2)

## 2019-02-11 LAB — FERRITIN: Ferritin: 211 ng/mL (ref 11–307)

## 2019-02-13 ENCOUNTER — Encounter: Payer: Medicare Other | Admitting: Physician Assistant

## 2019-02-13 ENCOUNTER — Other Ambulatory Visit: Payer: Self-pay

## 2019-02-13 DIAGNOSIS — S81812A Laceration without foreign body, left lower leg, initial encounter: Secondary | ICD-10-CM | POA: Diagnosis not present

## 2019-02-13 NOTE — Progress Notes (Signed)
[name] ([id number]) Visit Report for 02/13/2019 Arrival Information Details Patient Name: [name], [name] Date of Service: 02/13/2019 2:45 PM Medical Record Number: [id number] Patient Account Number: [id number] Date of Birth/Sex: [birth year] (85 y.o. F) Treating RN: [name] Primary Care [name]: [name] Other Clinician: Referring [name]: [name] Treating [name]/Extender: [name], [name] in Treatment: 9 Visit Information History Since Last Visit Added or deleted any medications: No Patient Arrived: [name] Any new allergies or adverse reactions: No Arrival Time: 14:53 Had a fall or experienced change in No Accompanied By: daughter activities of daily living that may affect Transfer Assistance: None risk of falls: Patient Identification Verified: Yes Signs or symptoms of abuse/neglect since last visito No Secondary Verification Process Completed: Yes Hospitalized since last visit: No Patient Requires Transmission-Based Precautions: No Implantable device outside of the clinic excluding No Patient Has Alerts: No cellular tissue based products placed in the center since last visit: Has Dressing in Place as Prescribed: Yes Pain Present Now: No Electronic Signature(s) Signed: 02/13/2019 3:59:53 PM By: [name] [name] Entered By: [name] on 02/13/2019 14:54:30 [name] ([id number]) -------------------------------------------------------------------------------- Clinic Level of Care Assessment Details Patient Name: [name], [name] Date of Service: 02/13/2019 2:45 PM Medical Record Number: [id number] Patient Account Number: [id number] Date of Birth/Sex: Jan 05, 1921 (85 y.o. F) Treating RN: [name] Primary Care [name]: [name] Other Clinician: Referring [name]: [name] Treating [name]/Extender: [name], [name] in Treatment: 9 Clinic Level of Care Assessment  Items TOOL 4 Quantity Score []  - Use when only an EandM is performed on FOLLOW-UP visit 0 ASSESSMENTS - Nursing Assessment / Reassessment X - Reassessment of Co-morbidities (includes updates in patient status) 1 10 X- 1 5 Reassessment of Adherence to Treatment Plan ASSESSMENTS - Wound and Skin Assessment / Reassessment X - Simple Wound Assessment / Reassessment - one wound 1 5 []  - 0 Complex Wound Assessment / Reassessment - multiple wounds []  - 0 Dermatologic / Skin Assessment (not related to wound area) ASSESSMENTS - Focused Assessment []  - Circumferential Edema Measurements - multi extremities 0 []  - 0 Nutritional Assessment / Counseling / Intervention []  - 0 Lower Extremity Assessment (monofilament, tuning fork, pulses) []  - 0 Peripheral Arterial Disease Assessment (using hand held doppler) ASSESSMENTS - Ostomy and/or Continence Assessment and Care []  - Incontinence Assessment and Management 0 []  - 0 Ostomy Care Assessment and Management (repouching, etc.) PROCESS - Coordination of Care X - Simple Patient / Family Education for ongoing care 1 15 []  - 0 Complex (extensive) Patient / Family Education for ongoing care X- 1 10 Staff obtains Programmer, systems, Records, Test Results / Process Orders []  - 0 Staff telephones HHA, Nursing Homes / Clarify orders / etc []  - 0 Routine Transfer to another Facility (non-emergent condition) []  - 0 Routine Hospital Admission (non-emergent condition) []  - 0 New Admissions / Biomedical engineer / Ordering NPWT, Apligraf, etc. []  - 0 Emergency Hospital Admission (emergent condition) X- 1 10 Simple Discharge Coordination Reier, Marybella B. ([id number]) []  - 0 Complex (extensive) Discharge Coordination PROCESS - Special Needs []  - Pediatric / Minor Patient Management 0 []  - 0 Isolation Patient Management []  - 0 Hearing / Language / Visual special needs []  - 0 Assessment of Community assistance (transportation, D/C planning, etc.) []  -  0 Additional assistance / Altered mentation []  - 0 Support Surface(s) Assessment (bed, cushion, seat, etc.) INTERVENTIONS - Wound Cleansing / Measurement X - Simple Wound Cleansing - one wound 1 5 []  -  0 Complex Wound Cleansing - multiple wounds X- 1 5 Wound Imaging (photographs - any number of wounds) []  - 0 Wound Tracing (instead of photographs) X- 1 5 Simple Wound Measurement - one wound []  - 0 Complex Wound Measurement - multiple wounds INTERVENTIONS - Wound Dressings []  - Small Wound Dressing one or multiple wounds 0 X- 1 15 Medium Wound Dressing one or multiple wounds []  - 0 Large Wound Dressing one or multiple wounds []  - 0 Application of Medications - topical []  - 0 Application of Medications - injection INTERVENTIONS - Miscellaneous []  - External ear exam 0 []  - 0 Specimen Collection (cultures, biopsies, blood, body fluids, etc.) []  - 0 Specimen(s) / Culture(s) sent or taken to Lab for analysis []  - 0 Patient Transfer (multiple staff / Civil Service fast streamer / Similar devices) []  - 0 Simple Staple / Suture removal (25 or less) []  - 0 Complex Staple / Suture removal (26 or more) []  - 0 Hypo / Hyperglycemic Management (close monitor of Blood Glucose) []  - 0 Ankle / Brachial Index (ABI) - do not check if billed separately X- 1 5 Vital Signs [name], [name] ([id number]) Has the patient been seen at the hospital within the last three years: Yes Total Score: 90 Level Of Care: New/Established - Level 3 Electronic Signature(s) Signed: 02/13/2019 4:17:37 PM By: [name] Entered By: [name] on 02/13/2019 15:11:07 [name] ([id number]) -------------------------------------------------------------------------------- Encounter Discharge Information Details Patient Name: Sandra Pair B. Date of Service: 02/13/2019 2:45 PM Medical Record Number: [id number] Patient Account Number: [id number] Date of Birth/Sex: [birth year] (85 y.o. F) Treating RN: [name] Primary Care [name]: [name] Other Clinician: Referring [name]: [name] Treating [name]/Extender: [name], [name] in Treatment: 9 Encounter Discharge Information Items Discharge Condition: Stable Ambulatory Status: Ambulatory Discharge Destination: Home Transportation: Private Auto Accompanied By: daughter Schedule Follow-up Appointment: Yes Clinical Summary of Care: Electronic Signature(s) Signed: 02/13/2019 4:17:37 PM By: [name] Entered By: [name] on 02/13/2019 15:11:56 Zehring, [name] ([id number]) -------------------------------------------------------------------------------- Lower Extremity Assessment Details Patient Name: [name], [name] Date of Service: 02/13/2019 2:45 PM Medical Record Number: [id number] Patient Account Number: [id number] Date of Birth/Sex: 10/24/1920 (85 y.o. F) Treating RN: Harold Barban Primary Care Cashlynn Yearwood: [name] Other Clinician: Referring Zeynep Fantroy: [name] Treating Tinleigh Whitmire/Extender: [name], [name] in Treatment: 9 Edema Assessment Assessed: [Left: No] [Right: No] [Left: Edema] [Right: :] Calf Left: Right: Point of Measurement: 32 cm From Medial Instep 37 cm cm Ankle Left: Right: Point of Measurement: 8 cm From Medial Instep 22.5 cm cm Vascular Assessment Pulses: Dorsalis Pedis Palpable: [Left:Yes] Posterior Tibial Palpable: [Left:Yes] Electronic Signature(s) Signed: 02/13/2019 4:35:07 PM By: Harold Barban Entered By: Harold Barban on 02/13/2019 14:59:57 [name], [name] Kitchen ([id number]) -------------------------------------------------------------------------------- Multi Wound Chart Details Patient Name: Sandra Pair B. Date of Service: 02/13/2019 2:45 PM Medical Record Number: [id number] Patient Account Number: [id number] Date of Birth/Sex: May 20, 1921 (85 y.o. F) Treating RN: [name] Primary Care Christyne Mccain: [name] Other Clinician: Referring Verneta Hamidi:  [name] Treating Lancelot Alyea/Extender: [name], [name] in Treatment: 9 Vital Signs Height(in): 64 Pulse(bpm): 70 Weight(lbs): 148 Blood Pressure(mmHg): 149/71 Body Mass Index(BMI): 25 Temperature(F): 99.0 Respiratory Rate 16 (breaths/min): Photos: [N/A:N/A] Wound Location: Left Lower Leg - Anterior N/A N/A Wounding Event: Other Lesion N/A N/A Primary Etiology: Skin Tear N/A N/A Comorbid History: Cataracts, Hypertension N/A N/A Date Acquired: 11/06/2018 N/A N/A Weeks of Treatment: 9 N/A N/A Wound Status: Open N/A N/A Measurements L x W x D 4x0.9x0.1 N/A  N/A (cm) Area (cm) : 2.827 N/A N/A Volume (cm) : 0.283 N/A N/A % Reduction in Area: 83.30% N/A N/A % Reduction in Volume: 83.30% N/A N/A Classification: Full Thickness Without N/A N/A Exposed Support Structures Exudate Amount: Medium N/A N/A Exudate Type: Serosanguineous N/A N/A Exudate Color: red, brown N/A N/A Wound Margin: Indistinct, nonvisible N/A N/A Granulation Amount: Large (67-100%) N/A N/A Granulation Quality: Red, Hyper-granulation N/A N/A Necrotic Amount: Small (1-33%) N/A N/A Exposed Structures: Fat Layer (Subcutaneous N/A N/A Tissue) Exposed: Yes Fascia: No Tendon: No Muscle: No Joint: No Bone: No [name], [name] ([id number]) Epithelialization: None N/A N/A Treatment Notes Electronic Signature(s) Signed: 02/13/2019 4:17:37 PM By: [name] Entered By: [name] on 02/13/2019 15:07:27 [name], [name] ([id number]) -------------------------------------------------------------------------------- [place] Plan Details Patient Name: Sandra Pair B. Date of Service: 02/13/2019 2:45 PM Medical Record Number: [id number] Patient Account Number: [id number] Date of Birth/Sex: May 17, 1921 (85 y.o. F) Treating RN: [name] Primary Care Wayne Wicklund: [name] Other Clinician: Referring Denys Labree: [name] Treating Margie Urbanowicz/Extender: [name], [name] in Treatment:  9 Active Inactive Necrotic Tissue Nursing Diagnoses: Impaired tissue integrity related to necrotic/devitalized tissue Goals: Necrotic/devitalized tissue will be minimized in the wound bed Date Initiated: 12/19/2018 Target Resolution Date: 12/25/2018 Goal Status: Active Interventions: Assess patient pain level pre-, during and post procedure and prior to discharge Treatment Activities: Enzymatic debridement : 12/19/2018 Notes: Orientation to the Wound Care Program Nursing Diagnoses: Knowledge deficit related to the wound healing center program Goals: Patient/caregiver will verbalize understanding of the [place] Date Initiated: 12/19/2018 Target Resolution Date: 12/25/2018 Goal Status: Active Interventions: Provide education on orientation to the wound center Notes: Pain, Acute or Chronic Nursing Diagnoses: Potential alteration in comfort, pain Goals: Patient will verbalize adequate pain control and receive pain control interventions during procedures as needed Date Initiated: 12/19/2018 Target Resolution Date: 12/25/2018 Goal Status: Active [name], [name] ([id number]) Interventions: Provide education on pain management Notes: Wound/Skin Impairment Nursing Diagnoses: Impaired tissue integrity Goals: Patient/caregiver will verbalize understanding of skin care regimen Date Initiated: 12/19/2018 Target Resolution Date: 12/25/2018 Goal Status: Active Interventions: Assess ulceration(s) every visit Treatment Activities: Skin care regimen initiated : 12/19/2018 Notes: Electronic Signature(s) Signed: 02/13/2019 4:17:37 PM By: [name] Entered By: [name] on 02/13/2019 15:07:16 [name], [name] ([id number]) -------------------------------------------------------------------------------- Pain Assessment Details Patient Name: Sandra Pair B. Date of Service: 02/13/2019 2:45 PM Medical Record Number: [id number] Patient Account Number: [id number] Date  of Birth/Sex: [birth year] (85 y.o. F) Treating RN: [name] Primary Care Karem Tomaso: [name] Other Clinician: Referring Lani Havlik: [name] Treating Ciro Tashiro/Extender: [name], [name] in Treatment: 9 Active Problems Location of Pain Severity and Description of Pain Patient Has Paino No Site Locations Pain Management and Medication Current Pain Management: Electronic Signature(s) Signed: 02/13/2019 3:59:53 PM By: Paulla Fore, RRT, Weeks Signed: 02/13/2019 4:17:37 PM By: [name] Entered By: [name] on 02/13/2019 14:54:41 Vint, [name] ([id number]) -------------------------------------------------------------------------------- Patient/Caregiver Education Details Patient Name: Sandra Pair B. Date of Service: 02/13/2019 2:45 PM Medical Record Number: [id number] Patient Account Number: [id number] Date of Birth/Gender: 05/05/1921 (85 y.o. F) Treating RN: [name] Primary Care Physician: [name] Other Clinician: Referring Physician: [place] Weeks Treating Physician/Extender: [name], [name] in Treatment: 9 Education Assessment Education Provided To: Patient Education Topics Provided Wound/Skin Impairment: Handouts: Caring for Your Ulcer Methods: Demonstration, Explain/Verbal Responses: State content correctly Electronic Signature(s) Signed: 02/13/2019 4:17:37 PM By: [name] Entered By: [name] on 02/13/2019 15:11:22 [name], [name] ([id number]) --------------------------------------------------------------------------------  Wound Assessment Details Patient Name: [name], [name] Date of Service: 02/13/2019 2:45 PM Medical Record Number: [id number] Patient Account Number: [id number] Date of Birth/Sex: [birth year] (85 y.o. F) Treating RN: Harold Barban Primary Care Alecxander Mainwaring: [name] Other Clinician: Referring Chantell Kunkler: [name] Treating Kinzlie Harney/Extender: [name], [name]  in Treatment: 9 Wound Status Wound Number: 1 Primary Etiology: Skin Tear Wound Location: Left Lower Leg - Anterior Wound Status: Open Wounding Event: Other Lesion Comorbid History: Cataracts, Hypertension Date Acquired: 11/06/2018 Weeks Of Treatment: 9 Clustered Wound: No Photos Wound Measurements Length: (cm) 4 Width: (cm) 0.9 Depth: (cm) 0.1 Area: (cm) 2.827 Volume: (cm) 0.283 % Reduction in Area: 83.3% % Reduction in Volume: 83.3% Epithelialization: None Tunneling: No Undermining: No Wound Description Full Thickness Without Exposed Support Classification: Structures Wound Margin: Indistinct, nonvisible Exudate Medium Amount: Exudate Type: Serosanguineous Exudate Color: red, brown Foul Odor After Cleansing: No Slough/Fibrino Yes Wound Bed Granulation Amount: Large (67-100%) Exposed Structure Granulation Quality: Red, Hyper-granulation Fascia Exposed: No Necrotic Amount: Small (1-33%) Fat Layer (Subcutaneous Tissue) Exposed: Yes Necrotic Quality: Adherent Slough Tendon Exposed: No Muscle Exposed: No Joint Exposed: No Bone Exposed: No [name], [name] ([id number]) Treatment Notes Wound #1 (Left, Anterior Lower Leg) Notes hydrafera blue, abd and conform, tubigrip G Electronic Signature(s) Signed: 02/13/2019 4:35:07 PM By: Harold Barban Entered By: Harold Barban on 02/13/2019 14:58:17 [name], [name] ([id number]) -------------------------------------------------------------------------------- Vitals Details Patient Name: Sandra Pair B. Date of Service: 02/13/2019 2:45 PM Medical Record Number: [id number] Patient Account Number: [id number] Date of Birth/Sex: February 28, 1921 (85 y.o. F) Treating RN: [name] Primary Care Rakeisha Nyce: [name] Other Clinician: Referring Uziah Sorter: [name] Treating Kristal Perl/Extender: [name], [name] in Treatment: 9 Vital Signs Time Taken: 14:53 Temperature (F): 99.0 Height (in): 64 Pulse (bpm):  70 Weight (lbs): 148 Respiratory Rate (breaths/min): 16 Body Mass Index (BMI): 25.4 Blood Pressure (mmHg): 149/71 Reference Range: 80 - 120 mg / dl Electronic Signature(s) Signed: 02/13/2019 3:59:53 PM By: [name] [name] Entered By: [name] on 02/13/2019 14:55:10

## 2019-02-13 NOTE — Progress Notes (Addendum)
Sandra, Weeks ([id number]) Visit Report for 02/13/2019 Chief Complaint Document Details Patient Name: Weeks, [name] Date of Service: 02/13/2019 2:45 PM Medical Record Number: [id number] Patient Account Number: [id number] Date of Birth/Sex: 02-12-1921 (85 y.o. F) Treating RN: [name] Primary Care Provider: Maryland Weeks Other Clinician: Referring Provider: Maryland Weeks Treating Provider/Extender: [name], [name] in Treatment: 9 Information Obtained from: Patient Chief Complaint Left leg wound, referred by her Surgeon Electronic Signature(s) Signed: 02/13/2019 3:05:20 PM By: Sandra Keeler PA-C Entered By: [name] on 02/13/2019 15:05:19 Weeks, [name] ([id number]) -------------------------------------------------------------------------------- HPI Details Patient Name: Sandra Pair B. Date of Service: 02/13/2019 2:45 PM Medical Record Number: [id number] Patient Account Number: [id number] Date of Birth/Sex: 23-Jun-1920 (85 y.o. F) Treating RN: [name] Primary Care Provider: Maryland Weeks Other Clinician: Referring Provider: Maryland Weeks Treating Provider/Extender: [name], [name] in Treatment: 9 History of Present Illness HPI Description: 85 year old female who developed a left anterior leg skin tear more than a month ago with a flap on it, this first started out when family were helping her put on a pair of pants and the edge of the pant leg scraped and pulled off in the area of skin on the left anterior shin. She was seen at an urgent care for the skin tear where the used a scissors to cut the loose lying skin flap on top of the wound. To give her course of antibiotics which I believe was for 10 days that she completed almost a month ago. Since then the wound had been scabbed but she experienced a lot of pain in this area family has been using Vaseline on this area after using Neosporin for some time and she is referred to the clinic for further  care of the wound with scab on it. Patient denies any fevers chills shakes. She is ambulatory and has family assist her when needed. Patient is at her baseline in terms of her activity level and functional status other than experiencing some pain in the left leg around the scab. ABIs were not obtained today on the left on account of significant pain associated with the scabbing wound Patient has history of breast cancer status post mastectomy in remission, colon cancer status post colectomy in remission, iron deficiency for which she gets iron infusions, hypertension, she is not on any blood thinners 12/19/18 on evaluation today patient actually appears to be redoing okay in regard to the skin tear on the left anterior lower extremity. With that being said this is still showing signs of a significant amount of necrotic tissue on the surface of the wound unfortunately. She's been "trying to let this scab" which obviously is not the right thing at this point. I do think that cental in keeping this area covered is gonna be the ideal way to get this to heal most appropriately. Obviously I explained to the patient as well as her son who was present during the evaluation today that we really need to try and get the necrotic tissue off of the surface of the wound in order to let this heal appropriately. Fortunately there's no signs of active infection. 12/26/2018 upon evaluation today patient appears to be doing better with regard to her left lower extremity ulcer. She has been tolerating the dressing changes specifically with the Santyl without complication. Fortunately there is no signs of active infection at this time. No fevers, chills, nausea, vomiting, or diarrhea. The dry dark eschar is loosening up and does seem to  be doing much better today which is great news. Overall I am pleased with the progress. 01/02/19 on evaluation today patient actually appears to be doing excellent in regard to her lower  extremity ulcer. She's been tolerating the dressing changes without complication. Fortunately the slough is loosening up and I do believe that the Santyl has been very beneficial in this regard. With that being said I did take almost a week for them to get the medication from the pharmacy [facility] seems to take forever to get this in stock. Other than that I'm overall pleased with how things again are appearing today. 01/09/2019 upon evaluation today patient appears to be doing quite well with regard to her left anterior lower extremity ulcer. She has been tolerating the dressing changes without complication. Fortunately there is no signs of active infection at this time. She does have some slough noted on the surface of the wound but overall this appears to be doing much better in my opinion. 01/16/2019 on evaluation today patient appears to be doing well with regard to her left lower extremity ulcer. She has been tolerating the dressing changes without complication and very pleased with the progress that she seems to be making. Fortunately there is no signs of active infection at this time. I do believe it may be time to switch to a different dressing I am thinking a Hydrofera Blue dressing would be beneficial. 01/23/19 on evaluation today patient appears to be doing excellent in regard to her left lower Trinity ulcer. She's been tolerating the dressing changes without complication. Fortunately there is no sign of active infection at this time. She's also having no significant pain which is excellent news. 01/31/19 on evaluation today patient's wound bed actually showed signs of excellent improvement at this time. There was a [name], [name] ([id number]) much smaller measurement noted. She does have issues with some slough noted at this point which did require sharp debridement post debridement the wound bed appears to be doing much better which is great news. Overall I am extremely happy with the  progress she seems to be making. The patient is likewise pleased as is her family member. 02/13/2019 on evaluation today patient appears to be doing much better with regard to her wound on the left lower extremity. She had a little bit of a setback and that the [facility] was somewhat stuck she was not used to that being the case. When she went to remove it she inadvertently tore her skin proximally and had quite a bit of bleeding she called her daughter over when this happened subsequently she has not really been putting any moisture on the dressing before removing it because she has not had to she had so much drainage. Now that this is starting to slow down from a drainage perspective she is probably can need to be a little bit more cautious with making sure she wets the dressing very well before removal in order to prevent any complications. Electronic Signature(s) Signed: 02/13/2019 3:14:07 PM By: Sandra Keeler PA-C Entered By: [name] on 02/13/2019 15:14:07 Weeks, [name] ([id number]) -------------------------------------------------------------------------------- Physical Exam Details Patient Name: Weeks, [name] Date of Service: 02/13/2019 2:45 PM Medical Record Number: [id number] Patient Account Number: [id number] Date of Birth/Sex: December 05, 1920 (85 y.o. F) Treating RN: [name] Primary Care Provider: Maryland Weeks Other Clinician: Referring Provider: Maryland Weeks Treating Provider/Extender: [name], [name] in Treatment: 9 Respiratory normal breathing without difficulty. clear to auscultation bilaterally. Cardiovascular regular rate  and rhythm with normal S1, S2. Psychiatric this patient is able to make decisions and demonstrates good insight into disease process. Alert and Oriented x 3. pleasant and cooperative. Notes Patient's wound bed currently showed signs of good granulation she did have again a little bit of a skin tear at the proximal portion of  the wound although the skin seems to have reattached for the most part at this time which is good news. Fortunately there is no signs of active infection at this point. Overall I think she can still utilize the [facility] dressing I just discussed whether she needs to make sure to get this very wet before removing in fact I would recommend just leaving the blue foam own until she gets in the shower to let it get nice and wet with the soapy warm water and then subsequently remove it peeling it down towards her toe. She voiced understanding. Electronic Signature(s) Signed: 02/13/2019 3:14:53 PM By: Sandra Keeler PA-C Entered By: [name] on 02/13/2019 15:14:52 Klecker, [name] ([id number]) -------------------------------------------------------------------------------- Physician Orders Details Patient Name: Sandra Pair B. Date of Service: 02/13/2019 2:45 PM Medical Record Number: [id number] Patient Account Number: [id number] Date of Birth/Sex: 03/18/1921 (85 y.o. F) Treating RN: [name] Primary Care Provider: Maryland Weeks Other Clinician: Referring Provider: Maryland Weeks Treating Provider/Extender: [name], [name] in Treatment: 9 Verbal / Phone Orders: No Diagnosis Coding ICD-10 Coding Code Description I87.2 Venous insufficiency (chronic) (peripheral) L97.822 Non-pressure chronic ulcer of other part of left lower leg with fat layer exposed Wound Cleansing Wound #1 Left,Anterior Lower Leg o Dial antibacterial soap, wash wounds, rinse and pat dry prior to dressing wounds o May Shower, gently pat wound dry prior to applying new dressing. Primary Wound Dressing Wound #1 Left,Anterior Lower Leg o Hydrafera Blue Ready Transfer Secondary Dressing Wound #1 Left,Anterior Lower Leg o ABD and Kerlix/Conform Dressing Change Frequency Wound #1 Left,Anterior Lower Leg o Change dressing every day. Follow-up Appointments o Return Appointment in 1  week. Edema Control Wound #1 Left,Anterior Lower Leg o Other: - Tubigrip G Electronic Signature(s) Signed: 02/13/2019 4:17:37 PM By: [name] Signed: 02/13/2019 5:23:22 PM By: Sandra Keeler PA-C Entered By: [name] on 02/13/2019 15:10:44 Rail, [name] ([id number]) -------------------------------------------------------------------------------- Problem List Details Patient Name: Weeks, [name] Date of Service: 02/13/2019 2:45 PM Medical Record Number: [id number] Patient Account Number: [id number] Date of Birth/Sex: Nov 19, 1920 (85 y.o. F) Treating RN: [name] Primary Care Provider: Maryland Weeks Other Clinician: Referring Provider: Maryland Weeks Treating Provider/Extender: [name], [name] in Treatment: 9 Active Problems ICD-10 Evaluated Encounter Code Description Active Date Today Diagnosis I87.2 Venous insufficiency (chronic) (peripheral) 12/10/2018 No Yes L97.822 Non-pressure chronic ulcer of other part of left lower leg with 12/10/2018 No Yes fat layer exposed Inactive Problems Resolved Problems Electronic Signature(s) Signed: 02/13/2019 3:05:12 PM By: Sandra Keeler PA-C Entered By: [name] on 02/13/2019 15:05:12 Hewlett, [name] ([id number]) -------------------------------------------------------------------------------- Progress Note Details Patient Name: Weeks, [name] Date of Service: 02/13/2019 2:45 PM Medical Record Number: [id number] Patient Account Number: [id number] Date of Birth/Sex: Jul 08, 1920 (85 y.o. F) Treating RN: [name] Primary Care Provider: Maryland Weeks Other Clinician: Referring Provider: Maryland Weeks Treating Provider/Extender: [name], [name] in Treatment: 9 Subjective Chief Complaint Information obtained from Patient Left leg wound, referred by her Surgeon History of Present Illness (HPI) 85 year old female who developed a left anterior leg skin tear more than a month ago with a flap on it, this  first started  out when family were helping her put on a pair of pants and the edge of the pant leg scraped and pulled off in the area of skin on the left anterior shin. She was seen at an urgent care for the skin tear where the used a scissors to cut the loose lying skin flap on top of the wound. To give her course of antibiotics which I believe was for 10 days that she completed almost a month ago. Since then the wound had been scabbed but she experienced a lot of pain in this area family has been using Vaseline on this area after using Neosporin for some time and she is referred to the clinic for further care of the wound with scab on it. Patient denies any fevers chills shakes. She is ambulatory and has family assist her when needed. Patient is at her baseline in terms of her activity level and functional status other than experiencing some pain in the left leg around the scab. ABIs were not obtained today on the left on account of significant pain associated with the scabbing wound Patient has history of breast cancer status post mastectomy in remission, colon cancer status post colectomy in remission, iron deficiency for which she gets iron infusions, hypertension, she is not on any blood thinners 12/19/18 on evaluation today patient actually appears to be redoing okay in regard to the skin tear on the left anterior lower extremity. With that being said this is still showing signs of a significant amount of necrotic tissue on the surface of the wound unfortunately. She's been "trying to let this scab" which obviously is not the right thing at this point. I do think that cental in keeping this area covered is gonna be the ideal way to get this to heal most appropriately. Obviously I explained to the patient as well as her son who was present during the evaluation today that we really need to try and get the necrotic tissue off of the surface of the wound in order to let this heal appropriately.  Fortunately there's no signs of active infection. 12/26/2018 upon evaluation today patient appears to be doing better with regard to her left lower extremity ulcer. She has been tolerating the dressing changes specifically with the Santyl without complication. Fortunately there is no signs of active infection at this time. No fevers, chills, nausea, vomiting, or diarrhea. The dry dark eschar is loosening up and does seem to be doing much better today which is great news. Overall I am pleased with the progress. 01/02/19 on evaluation today patient actually appears to be doing excellent in regard to her lower extremity ulcer. She's been tolerating the dressing changes without complication. Fortunately the slough is loosening up and I do believe that the Santyl has been very beneficial in this regard. With that being said I did take almost a week for them to get the medication from the pharmacy [facility] seems to take forever to get this in stock. Other than that I'm overall pleased with how things again are appearing today. 01/09/2019 upon evaluation today patient appears to be doing quite well with regard to her left anterior lower extremity ulcer. She has been tolerating the dressing changes without complication. Fortunately there is no signs of active infection at this time. She does have some slough noted on the surface of the wound but overall this appears to be doing much better in my opinion. 01/16/2019 on evaluation today patient appears to be doing well with regard  to her left lower extremity ulcer. She has been tolerating the dressing changes without complication and very pleased with the progress that she seems to be making. Fortunately there is no signs of active infection at this time. I do believe it may be time to switch to a different dressing I am thinking a Hydrofera Blue dressing would be beneficial. Weeks, [name] ([id number]) 01/23/19 on evaluation today patient appears to be doing  excellent in regard to her left lower Trinity ulcer. She's been tolerating the dressing changes without complication. Fortunately there is no sign of active infection at this time. She's also having no significant pain which is excellent news. 01/31/19 on evaluation today patient's wound bed actually showed signs of excellent improvement at this time. There was a much smaller measurement noted. She does have issues with some slough noted at this point which did require sharp debridement post debridement the wound bed appears to be doing much better which is great news. Overall I am extremely happy with the progress she seems to be making. The patient is likewise pleased as is her family member. 02/13/2019 on evaluation today patient appears to be doing much better with regard to her wound on the left lower extremity. She had a little bit of a setback and that the [facility] was somewhat stuck she was not used to that being the case. When she went to remove it she inadvertently tore her skin proximally and had quite a bit of bleeding she called her daughter over when this happened subsequently she has not really been putting any moisture on the dressing before removing it because she has not had to she had so much drainage. Now that this is starting to slow down from a drainage perspective she is probably can need to be a little bit more cautious with making sure she wets the dressing very well before removal in order to prevent any complications. Patient History Information obtained from Patient. Family History Cancer - Father,Mother,Siblings, No family history of Diabetes, Heart Disease, Hereditary Spherocytosis, Hypertension, Kidney Disease, Lung Disease, Seizures, Stroke, Thyroid Problems, Tuberculosis. Social History Never smoker, Marital Status - Widowed, Alcohol Use - Never, Drug Use - No History, Caffeine Use - Daily - coffee. Medical History Eyes Patient has history of Cataracts -  both Hematologic/Lymphatic Denies history of Anemia, Hemophilia, Human Immunodeficiency Virus, Lymphedema, Sickle Cell Disease Respiratory Denies history of Aspiration, Asthma, Chronic Obstructive Pulmonary Disease (COPD), Pneumothorax, Sleep Apnea, Tuberculosis Cardiovascular Patient has history of Hypertension Denies history of Angina, Arrhythmia, Congestive Heart Failure, Coronary Artery Disease, Deep Vein Thrombosis, Hypotension, Myocardial Infarction, Peripheral Arterial Disease, Peripheral Venous Disease, Phlebitis, Vasculitis Gastrointestinal Denies history of Cirrhosis , Colitis, Crohn s, Hepatitis A, Hepatitis B, Hepatitis C Endocrine Denies history of Type I Diabetes, Type II Diabetes Genitourinary Denies history of End Stage Renal Disease Immunological Denies history of Lupus Erythematosus, Raynaud s, Scleroderma Integumentary (Skin) Denies history of History of Burn, History of pressure wounds Musculoskeletal Denies history of Gout, Rheumatoid Arthritis, Osteoarthritis Neurologic Denies history of Dementia, Neuropathy, Quadriplegia, Paraplegia, Seizure Disorder Oncologic Denies history of Received Chemotherapy Psychiatric Denies history of Anorexia/bulimia, Confinement Anxiety Ermis, [name] ([id number]) Medical And Surgical History Notes Gastrointestinal ulcer issues Review of Systems (ROS) Constitutional Symptoms (General Health) Denies complaints or symptoms of Fatigue, Fever, Chills, Marked Weight Change. Respiratory Denies complaints or symptoms of Chronic or frequent coughs, Shortness of Breath. Cardiovascular Complains or has symptoms of LE edema. Denies complaints or symptoms of Chest pain. Psychiatric Denies complaints  or symptoms of Anxiety, Claustrophobia. Objective Constitutional Vitals Time Taken: 2:53 PM, Height: 64 in, Weight: 148 lbs, BMI: 25.4, Temperature: 99.0 F, Pulse: 70 bpm, Respiratory Rate: 16 breaths/min, Blood Pressure: 149/71  mmHg. Respiratory normal breathing without difficulty. clear to auscultation bilaterally. Cardiovascular regular rate and rhythm with normal S1, S2. Psychiatric this patient is able to make decisions and demonstrates good insight into disease process. Alert and Oriented x 3. pleasant and cooperative. General Notes: Patient's wound bed currently showed signs of good granulation she did have again a little bit of a skin tear at the proximal portion of the wound although the skin seems to have reattached for the most part at this time which is good news. Fortunately there is no signs of active infection at this point. Overall I think she can still utilize the [facility] dressing I just discussed whether she needs to make sure to get this very wet before removing in fact I would recommend just leaving the blue foam own until she gets in the shower to let it get nice and wet with the soapy warm water and then subsequently remove it peeling it down towards her toe. She voiced understanding. Integumentary (Hair, Skin) Wound #1 status is Open. Original cause of wound was Other Lesion. The wound is located on the Left,Anterior Lower Leg. The wound measures 4cm length x 0.9cm width x 0.1cm depth; 2.827cm^2 area and 0.283cm^3 volume. There is Fat Layer (Subcutaneous Tissue) Exposed exposed. There is no tunneling or undermining noted. There is a medium amount of serosanguineous drainage noted. The wound margin is indistinct and nonvisible. There is large (67-100%) red, hyper - granulation within the wound bed. There is a small (1-33%) amount of necrotic tissue within the wound bed including Adherent Slough. EZMERELDA, Weeks ([id number]) Assessment Active Problems ICD-10 Venous insufficiency (chronic) (peripheral) Non-pressure chronic ulcer of other part of left lower leg with fat layer exposed Plan Wound Cleansing: Wound #1 Left,Anterior Lower Leg: Dial antibacterial soap, wash wounds, rinse and  pat dry prior to dressing wounds May Shower, gently pat wound dry prior to applying new dressing. Primary Wound Dressing: Wound #1 Left,Anterior Lower Leg: Hydrafera Blue Ready Transfer Secondary Dressing: Wound #1 Left,Anterior Lower Leg: ABD and Kerlix/Conform Dressing Change Frequency: Wound #1 Left,Anterior Lower Leg: Change dressing every day. Follow-up Appointments: Return Appointment in 1 week. Edema Control: Wound #1 Left,Anterior Lower Leg: Other: - Tubigrip G 1. Patient's wound bed currently showed signs of excellent improvement in general other than skin tear she seems to have no setbacks. For that reason I would recommend that we continue with the [facility] dressing. 2. We will also continue with the Tubigrip this is something she is able to change on her own and she prides herself on being able to take care of herself at home including fixing her food as well as changing her dressing. 3. I did discuss with the patient that she need to be very careful with removing the dressing I explained exactly how she should do this in the shower to prevent any damage to her skin. She is in agreement with this plan. We will see patient back for reevaluation in 1 week here in the clinic. If anything worsens or changes patient will contact our office for additional recommendations. Electronic Signature(s) Signed: 02/13/2019 3:15:50 PM By: Sandra Keeler PA-C Entered By: [name] on 02/13/2019 15:15:49 Sheaffer, [name] ([id number]) -------------------------------------------------------------------------------- ROS/PFSH Details Patient Name: Sandra Pair B. Date of Service: 02/13/2019 2:45 PM  Medical Record Number: [id number] Patient Account Number: [id number] Date of Birth/Sex: May 25, 1921 (85 y.o. F) Treating RN: [name] Primary Care Provider: Maryland Weeks Other Clinician: Referring Provider: Maryland Weeks Treating Provider/Extender: [name], [name] in  Treatment: 9 Information Obtained From Patient Constitutional Symptoms (General Health) Complaints and Symptoms: Negative for: Fatigue; Fever; Chills; Marked Weight Change Respiratory Complaints and Symptoms: Negative for: Chronic or frequent coughs; Shortness of Breath Medical History: Negative for: Aspiration; Asthma; Chronic Obstructive Pulmonary Disease (COPD); Pneumothorax; Sleep Apnea; Tuberculosis Cardiovascular Complaints and Symptoms: Positive for: LE edema Negative for: Chest pain Medical History: Positive for: Hypertension Negative for: Angina; Arrhythmia; Congestive Heart Failure; Coronary Artery Disease; Deep Vein Thrombosis; Hypotension; Myocardial Infarction; Peripheral Arterial Disease; Peripheral Venous Disease; Phlebitis; Vasculitis Psychiatric Complaints and Symptoms: Negative for: Anxiety; Claustrophobia Medical History: Negative for: Anorexia/bulimia; Confinement Anxiety Eyes Medical History: Positive for: Cataracts - both Hematologic/Lymphatic Medical History: Negative for: Anemia; Hemophilia; Human Immunodeficiency Virus; Lymphedema; Sickle Cell Disease Gastrointestinal Medical History: Negative for: Cirrhosis ; Colitis; Crohnos; Hepatitis A; Hepatitis B; Hepatitis C Past Medical History Notes: DENIZE, HANKE ([id number]) ulcer issues Endocrine Medical History: Negative for: Type I Diabetes; Type II Diabetes Genitourinary Medical History: Negative for: End Stage Renal Disease Immunological Medical History: Negative for: Lupus Erythematosus; Raynaudos; Scleroderma Integumentary (Skin) Medical History: Negative for: History of Burn; History of pressure wounds Musculoskeletal Medical History: Negative for: Gout; Rheumatoid Arthritis; Osteoarthritis Neurologic Medical History: Negative for: Dementia; Neuropathy; Quadriplegia; Paraplegia; Seizure Disorder Oncologic Medical History: Negative for: Received Chemotherapy HBO Extended History  Items Eyes: Cataracts Immunizations Pneumococcal Vaccine: Received Pneumococcal Vaccination: Yes Implantable Devices None Family and Social History Cancer: Yes - Father,Mother,Siblings; Diabetes: No; Heart Disease: No; Hereditary Spherocytosis: No; Hypertension: No; Kidney Disease: No; Lung Disease: No; Seizures: No; Stroke: No; Thyroid Problems: No; Tuberculosis: No; Never smoker; Marital Status - Widowed; Alcohol Use: Never; Drug Use: No History; Caffeine Use: Daily - coffee; Financial Concerns: No; Food, Clothing or Shelter Needs: No; Support System Lacking: No; Transportation Concerns: No Physician Affirmation I have reviewed and agree with the above information. Electronic Signature(s) Weeks, Sandra ([id number]) Signed: 02/13/2019 4:17:37 PM By: [name] Signed: 02/13/2019 5:23:22 PM By: Sandra Keeler PA-C Entered By: [name] on 02/13/2019 15:14:25 Mezo, [name] ([id number]) -------------------------------------------------------------------------------- SuperBill Details Patient Name: Sandra Pair B. Date of Service: 02/13/2019 Medical Record Number: [id number] Patient Account Number: [id number] Date of Birth/Sex: 1920-10-18 (85 y.o. F) Treating RN: [name] Primary Care Provider: [place] Weeks Other Clinician: Referring Provider: [place] Weeks Treating Provider/Extender: [name], [name] in Treatment: 9 Diagnosis Coding ICD-10 Codes Code Description I87.2 Venous insufficiency (chronic) (peripheral) L97.822 Non-pressure chronic ulcer of other part of left lower leg with fat layer exposed Facility Procedures CPT4 Code: [id number] Description: [facility] VISIT-LEV 3 EST PT Modifier: Quantity: 1 Physician Procedures CPT4 Code Description: [id number] - WC PHYS LEVEL 4 - EST PT ICD-10 Diagnosis Description I87.2 Venous insufficiency (chronic) (peripheral) L97.822 Non-pressure chronic ulcer of other part of left lower leg wit Modifier: h  fat layer expos Quantity: 1 ed Electronic Signature(s) Signed: 02/13/2019 3:16:03 PM By: Sandra Keeler PA-C Entered By: [name] on 02/13/2019 15:16:03

## 2019-02-20 ENCOUNTER — Encounter: Payer: Self-pay | Admitting: Obstetrics and Gynecology

## 2019-02-20 ENCOUNTER — Other Ambulatory Visit: Payer: Self-pay

## 2019-02-20 ENCOUNTER — Encounter: Payer: Medicare Other | Admitting: Physician Assistant

## 2019-02-20 ENCOUNTER — Ambulatory Visit (INDEPENDENT_AMBULATORY_CARE_PROVIDER_SITE_OTHER): Payer: Medicare Other | Admitting: Obstetrics and Gynecology

## 2019-02-20 VITALS — BP 158/75 | HR 72 | Ht 64.0 in | Wt 155.0 lb

## 2019-02-20 DIAGNOSIS — S81812A Laceration without foreign body, left lower leg, initial encounter: Secondary | ICD-10-CM | POA: Diagnosis not present

## 2019-02-20 DIAGNOSIS — N814 Uterovaginal prolapse, unspecified: Secondary | ICD-10-CM

## 2019-02-20 DIAGNOSIS — Z4689 Encounter for fitting and adjustment of other specified devices: Secondary | ICD-10-CM | POA: Diagnosis not present

## 2019-02-20 DIAGNOSIS — N952 Postmenopausal atrophic vaginitis: Secondary | ICD-10-CM

## 2019-02-20 DIAGNOSIS — Z87440 Personal history of urinary (tract) infections: Secondary | ICD-10-CM

## 2019-02-20 LAB — POCT URINALYSIS DIPSTICK
Bilirubin, UA: NEGATIVE
Glucose, UA: NEGATIVE
Ketones, UA: NEGATIVE
Nitrite, UA: NEGATIVE
Protein, UA: NEGATIVE
Spec Grav, UA: 1.015 (ref 1.010–1.025)
Urobilinogen, UA: 0.2 E.U./dL
pH, UA: 6 (ref 5.0–8.0)

## 2019-02-20 NOTE — Progress Notes (Signed)
    GYNECOLOGY PROGRESS NOTE  Subjective:    Patient ID: [name], female    DOB: [birth year], 85 y.o.   MRN: [id number]    Patient is a 85 y.o. [id number] female who presents for pessary check.  Pessary currently in place for cystocele with vaginal prolapse. Also with vaginal atrophy. Denies vaginal bleeding or abnormal discharge.  She denies difficulty voiding or passing stools.  Notes she is doing well today, denies complaints.   Patient desires to have urine checked today due to her history of UTI's. Denies any dysuria or hematuria.   The following portions of the patient's history were reviewed and updated as appropriate: allergies, current medications, past family history, past medical history, past social history, past surgical history and problem list.  Review of Systems A comprehensive review of systems was negative.   Objective:   Blood pressure (!) 158/75, pulse 72, height 5\' 4"  (1.626 m), weight 155 lb (70.3 kg). General appearance: alert and no distress Abdomen: soft, non-tender; bowel sounds normal; no masses,  no organomegaly.  Pelvic:  The patient's size 5 pessary was removed, cleaned and replaced without complications.  Speculum examination revealed normal vaginal mucosa with no lesions or lacerations.    Labs:  Results for orders placed or performed in visit on 02/20/19  POCT urinalysis dipstick  Result Value Ref Range   Color, UA yellow    Clarity, UA clear    Glucose, UA Negative Negative   Bilirubin, UA neg    Ketones, UA neg    Spec Grav, UA 1.015 1.010 - 1.025   Blood, UA non-hem trace    pH, UA 6.0 5.0 - 8.0   Protein, UA Negative Negative   Urobilinogen, UA 0.2 0.2 or 1.0 E.U./dL   Nitrite, UA neg    Leukocytes, UA Moderate (2+) (A) Negative   Appearance yellow    Odor      Assessment:   Pessary maintenance Cystocele with vaginal vault prolapse Vaginal atrophy (moderate)  H/o recurrent UTI's  Plan:    - Pessary cleaned today, reinserted.   The patient should return in 3 months for a pessary check.  Continue to use Trimo-San gel internally once or twice weekly, and continue to use Premarin cream for external use  for h/o recurrent UTIs.   - UA performed today at patient's request. Patient with h/o recurrent UTI's and asymptomatic bacteriuria.  Will culture.    Rubie Maid, MD [facility]

## 2019-02-20 NOTE — Progress Notes (Signed)
Pt stated that is doing well with the pessary. Pt stated that she wanted her urine checked today.

## 2019-02-20 NOTE — Progress Notes (Addendum)
CYRIA, VESCO ([id number]) Visit Report for 02/20/2019 Chief Complaint Document Details Patient Name: [name], [name] Date of Service: 02/20/2019 3:30 PM Medical Record Number: [id number] Patient Account Number: [id number] Date of Birth/Sex: 1920/09/24 (85 y.o. F) Treating RN: Army Melia Primary Care Provider: [name] Other Clinician: Referring Provider: [name] Treating Provider/Extender: Melburn Hake, Tadeo Besecker Weeks in Treatment: 10 Information Obtained from: Patient Chief Complaint Left leg wound, referred by her Surgeon Electronic Signature(s) Signed: 02/20/2019 3:27:15 PM By: Worthy Keeler PA-C Entered By: Worthy Keeler on 02/20/2019 15:27:15 [name], [name] ([id number]) -------------------------------------------------------------------------------- Debridement Details Patient Name: Sandra Pair B. Date of Service: 02/20/2019 3:30 PM Medical Record Number: [id number] Patient Account Number: [id number] Date of Birth/Sex: Apr 25, 1921 (85 y.o. F) Treating RN: Army Melia Primary Care Provider: [name] Other Clinician: Referring Provider: [name] Treating Provider/Extender: Melburn Hake, Lavene Penagos Weeks in Treatment: 10 Debridement Performed for Wound #1 Left,Anterior Lower Leg Assessment: Performed By: Physician STONE III, Tamaka Sawin E., PA-C Debridement Type: Debridement Level of Consciousness (Pre- Awake and Alert procedure): Pre-procedure Verification/Time Yes - 15:50 Out Taken: Start Time: 15:51 Pain Control: Lidocaine Total Area Debrided (L x W): 1.3 (cm) x 0.4 (cm) = 0.52 (cm) Tissue and other material Viable, Slough, Subcutaneous, Skin: Dermis , Slough debrided: Level: Skin/Subcutaneous Tissue Debridement Description: Excisional Instrument: Curette Bleeding: Minimum Hemostasis Achieved: Pressure End Time: 15:52 Response to Treatment: Procedure was tolerated well Level of Consciousness Awake and Alert (Post-procedure): Post Debridement  Measurements of Total Wound Length: (cm) 1.3 Width: (cm) 0.4 Depth: (cm) 0.1 Volume: (cm) 0.041 Character of Wound/Ulcer Post Debridement: Stable Post Procedure Diagnosis Same as Pre-procedure Electronic Signature(s) Signed: 02/20/2019 4:47:50 PM By: Worthy Keeler PA-C Signed: 02/21/2019 4:09:41 PM By: Army Melia Entered By: Army Melia on 02/20/2019 15:52:22 [name], [name] Kitchen ([id number]) -------------------------------------------------------------------------------- HPI Details Patient Name: Sandra Pair B. Date of Service: 02/20/2019 3:30 PM Medical Record Number: [id number] Patient Account Number: [id number] Date of Birth/Sex: 08-Aug-1920 (85 y.o. F) Treating RN: Army Melia Primary Care Provider: [name] Other Clinician: Referring Provider: [name] Treating Provider/Extender: Melburn Hake, Aldena Worm Weeks in Treatment: 10 History of Present Illness HPI Description: 85 year old female who developed a left anterior leg skin tear more than a month ago with a flap on it, this first started out when family were helping her put on a pair of pants and the edge of the pant leg scraped and pulled off in the area of skin on the left anterior shin. She was seen at an urgent care for the skin tear where the used a scissors to cut the loose lying skin flap on top of the wound. To give her course of antibiotics which I believe was for 10 days that she completed almost a month ago. Since then the wound had been scabbed but she experienced a lot of pain in this area family has been using Vaseline on this area after using Neosporin for some time and she is referred to the clinic for further care of the wound with scab on it. Patient denies any fevers chills shakes. She is ambulatory and has family assist her when needed. Patient is at her baseline in terms of her activity level and functional status other than experiencing some pain in the left leg around the scab. ABIs were not obtained  today on the left on account of significant pain associated with the scabbing wound Patient has history of breast cancer status post mastectomy in remission, colon cancer status post colectomy in remission, iron deficiency  for which she gets iron infusions, hypertension, she is not on any blood thinners 12/19/18 on evaluation today patient actually appears to be redoing okay in regard to the skin tear on the left anterior lower extremity. With that being said this is still showing signs of a significant amount of necrotic tissue on the surface of the wound unfortunately. She's been "trying to let this scab" which obviously is not the right thing at this point. I do think that cental in keeping this area covered is gonna be the ideal way to get this to heal most appropriately. Obviously I explained to the patient as well as her son who was present during the evaluation today that we really need to try and get the necrotic tissue off of the surface of the wound in order to let this heal appropriately. Fortunately there's no signs of active infection. 12/26/2018 upon evaluation today patient appears to be doing better with regard to her left lower extremity ulcer. She has been tolerating the dressing changes specifically with the Santyl without complication. Fortunately there is no signs of active infection at this time. No fevers, chills, nausea, vomiting, or diarrhea. The dry dark eschar is loosening up and does seem to be doing much better today which is great news. Overall I am pleased with the progress. 01/02/19 on evaluation today patient actually appears to be doing excellent in regard to her lower extremity ulcer. She's been tolerating the dressing changes without complication. Fortunately the slough is loosening up and I do believe that the Santyl has been very beneficial in this regard. With that being said I did take almost a week for them to get the medication from the pharmacy [facility] seems to  take forever to get this in stock. Other than that I'm overall pleased with how things again are appearing today. 01/09/2019 upon evaluation today patient appears to be doing quite well with regard to her left anterior lower extremity ulcer. She has been tolerating the dressing changes without complication. Fortunately there is no signs of active infection at this time. She does have some slough noted on the surface of the wound but overall this appears to be doing much better in my opinion. 01/16/2019 on evaluation today patient appears to be doing well with regard to her left lower extremity ulcer. She has been tolerating the dressing changes without complication and very pleased with the progress that she seems to be making. Fortunately there is no signs of active infection at this time. I do believe it may be time to switch to a different dressing I am thinking a Hydrofera Blue dressing would be beneficial. 01/23/19 on evaluation today patient appears to be doing excellent in regard to her left lower Trinity ulcer. She's been tolerating the dressing changes without complication. Fortunately there is no sign of active infection at this time. She's also having no significant pain which is excellent news. 01/31/19 on evaluation today patient's wound bed actually showed signs of excellent improvement at this time. There was a Larmon, Zowie B. ([id number]) much smaller measurement noted. She does have issues with some slough noted at this point which did require sharp debridement post debridement the wound bed appears to be doing much better which is great news. Overall I am extremely happy with the progress she seems to be making. The patient is likewise pleased as is her family member. 02/13/2019 on evaluation today patient appears to be doing much better with regard to her wound on the left lower  extremity. She had a little bit of a setback and that the [facility] was somewhat stuck she was not  used to that being the case. When she went to remove it she inadvertently tore her skin proximally and had quite a bit of bleeding she called her daughter over when this happened subsequently she has not really been putting any moisture on the dressing before removing it because she has not had to she had so much drainage. Now that this is starting to slow down from a drainage perspective she is probably can need to be a little bit more cautious with making sure she wets the dressing very well before removal in order to prevent any complications. 02/20/2019 on evaluation today patient appears to be doing quite well with regard to her left anterior lower extremity ulcer. This is healing nicely and in fact is becoming more dried to the point that it is difficult to get the dressing off at times with regard to the [facility]. She is very close I believe based on what I am seeing to complete closure. Electronic Signature(s) Signed: 02/20/2019 3:57:31 PM By: Worthy Keeler PA-C Entered By: Worthy Keeler on 02/20/2019 15:57:30 [name], [name] ([id number]) -------------------------------------------------------------------------------- Physical Exam Details Patient Name: Tosh, Korrie B. Date of Service: 02/20/2019 3:30 PM Medical Record Number: [id number] Patient Account Number: [id number] Date of Birth/Sex: December 26, 1920 (85 y.o. F) Treating RN: Army Melia Primary Care Provider: [name] Other Clinician: Referring Provider: [name] Treating Provider/Extender: Melburn Hake, Winda Summerall Weeks in Treatment: 23 Constitutional Well-nourished and well-hydrated in no acute distress. Respiratory normal breathing without difficulty. Psychiatric this patient is able to make decisions and demonstrates good insight into disease process. Alert and Oriented x 3. pleasant and cooperative. Notes Patient's wound bed currently showed signs of good granulation at this time there does not appear to be any  evidence of infection which is great news no fevers, chills, nausea, vomiting, or diarrhea. I did perform sharp debridement today to remove the dry dressing material, drainage, and slough from the surface of the wound which she tolerated without complication and post debridement the wound bed appears to be doing much better which is great news. Electronic Signature(s) Signed: 02/20/2019 3:58:15 PM By: Worthy Keeler PA-C Entered By: Worthy Keeler on 02/20/2019 15:58:15 Peasley, [name] ([id number]) -------------------------------------------------------------------------------- Physician Orders Details Patient Name: Sandra Pair B. Date of Service: 02/20/2019 3:30 PM Medical Record Number: [id number] Patient Account Number: [id number] Date of Birth/Sex: August 20, 1920 (85 y.o. F) Treating RN: Army Melia Primary Care Provider: [name] Other Clinician: Referring Provider: [name] Treating Provider/Extender: Melburn Hake, Sunny Gains Weeks in Treatment: 10 Verbal / Phone Orders: No Diagnosis Coding ICD-10 Coding Code Description I87.2 Venous insufficiency (chronic) (peripheral) L97.822 Non-pressure chronic ulcer of other part of left lower leg with fat layer exposed Wound Cleansing Wound #1 Left,Anterior Lower Leg o Dial antibacterial soap, wash wounds, rinse and pat dry prior to dressing wounds o May Shower, gently pat wound dry prior to applying new dressing. Primary Wound Dressing Wound #1 Left,Anterior Lower Leg o Hydrafera Blue Ready Transfer o Other: - adaptic under the hydrofera Secondary Dressing Wound #1 Left,Anterior Lower Leg o ABD and Kerlix/Conform Dressing Change Frequency Wound #1 Left,Anterior Lower Leg o Change dressing every day. Follow-up Appointments o Return Appointment in 1 week. Edema Control Wound #1 Left,Anterior Lower Leg o Other: - Tubigrip G Electronic Signature(s) Signed: 02/20/2019 4:47:50 PM By: Worthy Keeler PA-C Signed:  02/21/2019 4:09:41 PM By: Nicki Reaper,  Dajea Entered By: Army Melia on 02/20/2019 15:54:29 Shonka, [name] ([id number]) -------------------------------------------------------------------------------- Problem List Details Patient Name: Acton, Amayia B. Date of Service: 02/20/2019 3:30 PM Medical Record Number: [id number] Patient Account Number: [id number] Date of Birth/Sex: Feb 20, 1921 (85 y.o. F) Treating RN: Army Melia Primary Care Provider: [name] Other Clinician: Referring Provider: [name] Treating Provider/Extender: Melburn Hake, Annalisia Ingber Weeks in Treatment: 10 Active Problems ICD-10 Evaluated Encounter Code Description Active Date Today Diagnosis I87.2 Venous insufficiency (chronic) (peripheral) 12/10/2018 No Yes L97.822 Non-pressure chronic ulcer of other part of left lower leg with 12/10/2018 No Yes fat layer exposed Inactive Problems Resolved Problems Electronic Signature(s) Signed: 02/20/2019 3:27:10 PM By: Worthy Keeler PA-C Entered By: Worthy Keeler on 02/20/2019 15:27:09 [name], [name] ([id number]) -------------------------------------------------------------------------------- Progress Note Details Patient Name: [name], [name] Date of Service: 02/20/2019 3:30 PM Medical Record Number: [id number] Patient Account Number: [id number] Date of Birth/Sex: [birth year] (85 y.o. F) Treating RN: Army Melia Primary Care Provider: [name] Other Clinician: Referring Provider: [name] Treating Provider/Extender: Melburn Hake, Kjersten Ormiston Weeks in Treatment: 10 Subjective Chief Complaint Information obtained from Patient Left leg wound, referred by her Surgeon History of Present Illness (HPI) 85 year old female who developed a left anterior leg skin tear more than a month ago with a flap on it, this first started out when family were helping her put on a pair of pants and the edge of the pant leg scraped and pulled off in the area of skin on the left anterior shin.  She was seen at an urgent care for the skin tear where the used a scissors to cut the loose lying skin flap on top of the wound. To give her course of antibiotics which I believe was for 10 days that she completed almost a month ago. Since then the wound had been scabbed but she experienced a lot of pain in this area family has been using Vaseline on this area after using Neosporin for some time and she is referred to the clinic for further care of the wound with scab on it. Patient denies any fevers chills shakes. She is ambulatory and has family assist her when needed. Patient is at her baseline in terms of her activity level and functional status other than experiencing some pain in the left leg around the scab. ABIs were not obtained today on the left on account of significant pain associated with the scabbing wound Patient has history of breast cancer status post mastectomy in remission, colon cancer status post colectomy in remission, iron deficiency for which she gets iron infusions, hypertension, she is not on any blood thinners 12/19/18 on evaluation today patient actually appears to be redoing okay in regard to the skin tear on the left anterior lower extremity. With that being said this is still showing signs of a significant amount of necrotic tissue on the surface of the wound unfortunately. She's been "trying to let this scab" which obviously is not the right thing at this point. I do think that cental in keeping this area covered is gonna be the ideal way to get this to heal most appropriately. Obviously I explained to the patient as well as her son who was present during the evaluation today that we really need to try and get the necrotic tissue off of the surface of the wound in order to let this heal appropriately. Fortunately there's no signs of active infection. 12/26/2018 upon evaluation today patient appears to be doing better with regard to her  left lower extremity ulcer. She has  been tolerating the dressing changes specifically with the Santyl without complication. Fortunately there is no signs of active infection at this time. No fevers, chills, nausea, vomiting, or diarrhea. The dry dark eschar is loosening up and does seem to be doing much better today which is great news. Overall I am pleased with the progress. 01/02/19 on evaluation today patient actually appears to be doing excellent in regard to her lower extremity ulcer. She's been tolerating the dressing changes without complication. Fortunately the slough is loosening up and I do believe that the Santyl has been very beneficial in this regard. With that being said I did take almost a week for them to get the medication from the pharmacy [facility] seems to take forever to get this in stock. Other than that I'm overall pleased with how things again are appearing today. 01/09/2019 upon evaluation today patient appears to be doing quite well with regard to her left anterior lower extremity ulcer. She has been tolerating the dressing changes without complication. Fortunately there is no signs of active infection at this time. She does have some slough noted on the surface of the wound but overall this appears to be doing much better in my opinion. 01/16/2019 on evaluation today patient appears to be doing well with regard to her left lower extremity ulcer. She has been tolerating the dressing changes without complication and very pleased with the progress that she seems to be making. Fortunately there is no signs of active infection at this time. I do believe it may be time to switch to a different dressing I am thinking a Hydrofera Blue dressing would be beneficial. [name], [name] ([id number]) 01/23/19 on evaluation today patient appears to be doing excellent in regard to her left lower Trinity ulcer. She's been tolerating the dressing changes without complication. Fortunately there is no sign of active infection at this  time. She's also having no significant pain which is excellent news. 01/31/19 on evaluation today patient's wound bed actually showed signs of excellent improvement at this time. There was a much smaller measurement noted. She does have issues with some slough noted at this point which did require sharp debridement post debridement the wound bed appears to be doing much better which is great news. Overall I am extremely happy with the progress she seems to be making. The patient is likewise pleased as is her family member. 02/13/2019 on evaluation today patient appears to be doing much better with regard to her wound on the left lower extremity. She had a little bit of a setback and that the [facility] was somewhat stuck she was not used to that being the case. When she went to remove it she inadvertently tore her skin proximally and had quite a bit of bleeding she called her daughter over when this happened subsequently she has not really been putting any moisture on the dressing before removing it because she has not had to she had so much drainage. Now that this is starting to slow down from a drainage perspective she is probably can need to be a little bit more cautious with making sure she wets the dressing very well before removal in order to prevent any complications. 02/20/2019 on evaluation today patient appears to be doing quite well with regard to her left anterior lower extremity ulcer. This is healing nicely and in fact is becoming more dried to the point that it is difficult to get the dressing off  at times with regard to the [facility]. She is very close I believe based on what I am seeing to complete closure. Patient History Information obtained from Patient. Family History Cancer - Father,Mother,Siblings, No family history of Diabetes, Heart Disease, Hereditary Spherocytosis, Hypertension, Kidney Disease, Lung Disease, Seizures, Stroke, Thyroid Problems,  Tuberculosis. Social History Never smoker, Marital Status - Widowed, Alcohol Use - Never, Drug Use - No History, Caffeine Use - Daily - coffee. Medical History Eyes Patient has history of Cataracts - both Hematologic/Lymphatic Denies history of Anemia, Hemophilia, Human Immunodeficiency Virus, Lymphedema, Sickle Cell Disease Respiratory Denies history of Aspiration, Asthma, Chronic Obstructive Pulmonary Disease (COPD), Pneumothorax, Sleep Apnea, Tuberculosis Cardiovascular Patient has history of Hypertension Denies history of Angina, Arrhythmia, Congestive Heart Failure, Coronary Artery Disease, Deep Vein Thrombosis, Hypotension, Myocardial Infarction, Peripheral Arterial Disease, Peripheral Venous Disease, Phlebitis, Vasculitis Gastrointestinal Denies history of Cirrhosis , Colitis, Crohn s, Hepatitis A, Hepatitis B, Hepatitis C Endocrine Denies history of Type I Diabetes, Type II Diabetes Genitourinary Denies history of End Stage Renal Disease Immunological Denies history of Lupus Erythematosus, Raynaud s, Scleroderma Integumentary (Skin) Denies history of History of Burn, History of pressure wounds Musculoskeletal Denies history of Gout, Rheumatoid Arthritis, Osteoarthritis Neurologic Denies history of Dementia, Neuropathy, Quadriplegia, Paraplegia, Seizure Disorder Oncologic [name], [name] ([id number]) Denies history of Received Chemotherapy Psychiatric Denies history of Anorexia/bulimia, Confinement Anxiety Medical And Surgical History Notes Gastrointestinal ulcer issues Review of Systems (ROS) Constitutional Symptoms (General Health) Denies complaints or symptoms of Fatigue, Fever, Chills, Marked Weight Change. Respiratory Denies complaints or symptoms of Chronic or frequent coughs, Shortness of Breath. Cardiovascular Denies complaints or symptoms of Chest pain, LE edema. Psychiatric Denies complaints or symptoms of Anxiety,  Claustrophobia. Objective Constitutional Well-nourished and well-hydrated in no acute distress. Vitals Time Taken: 3:38 PM, Height: 64 in, Weight: 148 lbs, BMI: 25.4, Temperature: 98.6 F, Pulse: 66 bpm, Respiratory Rate: 18 breaths/min, Blood Pressure: 146/71 mmHg. Respiratory normal breathing without difficulty. Psychiatric this patient is able to make decisions and demonstrates good insight into disease process. Alert and Oriented x 3. pleasant and cooperative. General Notes: Patient's wound bed currently showed signs of good granulation at this time there does not appear to be any evidence of infection which is great news no fevers, chills, nausea, vomiting, or diarrhea. I did perform sharp debridement today to remove the dry dressing material, drainage, and slough from the surface of the wound which she tolerated without complication and post debridement the wound bed appears to be doing much better which is great news. Integumentary (Hair, Skin) Wound #1 status is Open. Original cause of wound was Other Lesion. The wound is located on the Left,Anterior Lower Leg. The wound measures 1.3cm length x 0.4cm width x 0.1cm depth; 0.408cm^2 area and 0.041cm^3 volume. There is Fat Layer (Subcutaneous Tissue) Exposed exposed. There is no tunneling or undermining noted. There is a medium amount of serosanguineous drainage noted. The wound margin is indistinct and nonvisible. There is large (67-100%) red, hyper - granulation within the wound bed. There is a small (1-33%) amount of necrotic tissue within the wound bed including Adherent Slough. [name], [name] ([id number]) Assessment Active Problems ICD-10 Venous insufficiency (chronic) (peripheral) Non-pressure chronic ulcer of other part of left lower leg with fat layer exposed Procedures Wound #1 Pre-procedure diagnosis of Wound #1 is a Skin Tear located on the Left,Anterior Lower Leg . There was a Excisional Skin/Subcutaneous Tissue  Debridement with a total area of 0.52 sq cm performed by STONE III, Nyna Chilton  E., PA-C. With the following instrument(s): Curette to remove Viable tissue/material. Material removed includes Subcutaneous Tissue, Slough, and Skin: Dermis after achieving pain control using Lidocaine. A time out was conducted at 15:50, prior to the start of the procedure. A Minimum amount of bleeding was controlled with Pressure. The procedure was tolerated well. Post Debridement Measurements: 1.3cm length x 0.4cm width x 0.1cm depth; 0.041cm^3 volume. Character of Wound/Ulcer Post Debridement is stable. Post procedure Diagnosis Wound #1: Same as Pre-Procedure Plan Wound Cleansing: Wound #1 Left,Anterior Lower Leg: Dial antibacterial soap, wash wounds, rinse and pat dry prior to dressing wounds May Shower, gently pat wound dry prior to applying new dressing. Primary Wound Dressing: Wound #1 Left,Anterior Lower Leg: Hydrafera Blue Ready Transfer Other: - adaptic under the hydrofera Secondary Dressing: Wound #1 Left,Anterior Lower Leg: ABD and Kerlix/Conform Dressing Change Frequency: Wound #1 Left,Anterior Lower Leg: Change dressing every day. Follow-up Appointments: Return Appointment in 1 week. Edema Control: Wound #1 Left,Anterior Lower Leg: Other: - Tubigrip G 1. I would recommend currently that we go ahead and initiate a continuation of the Hydrofera Blue dressing although I am going to suggest that we use Adaptic underneath this in order to prevent the Hydrofera Blue from sticking. 2. I am good also recommend that she continue with the ABD along with Kerlix to secure everything in place followed by the [name], [name] ([id number]) Tubigrip which seems to be doing well for her. 3. I still recommend the patient elevate her legs as much as possible to prevent this from swelling. We will see patient back for reevaluation in 1 week here in the clinic. If anything worsens or changes patient will contact  our office for additional recommendations. Electronic Signature(s) Signed: 02/20/2019 3:58:49 PM By: Worthy Keeler PA-C Entered By: Worthy Keeler on 02/20/2019 15:58:48 Wike, [name] ([id number]) -------------------------------------------------------------------------------- ROS/PFSH Details Patient Name: Sandra Pair B. Date of Service: 02/20/2019 3:30 PM Medical Record Number: [id number] Patient Account Number: [id number] Date of Birth/Sex: 15-Jan-1921 (85 y.o. F) Treating RN: Army Melia Primary Care Provider: [name] Other Clinician: Referring Provider: [name] Treating Provider/Extender: Melburn Hake, Cassy Sprowl Weeks in Treatment: 10 Information Obtained From Patient Constitutional Symptoms (General Health) Complaints and Symptoms: Negative for: Fatigue; Fever; Chills; Marked Weight Change Respiratory Complaints and Symptoms: Negative for: Chronic or frequent coughs; Shortness of Breath Medical History: Negative for: Aspiration; Asthma; Chronic Obstructive Pulmonary Disease (COPD); Pneumothorax; Sleep Apnea; Tuberculosis Cardiovascular Complaints and Symptoms: Negative for: Chest pain; LE edema Medical History: Positive for: Hypertension Negative for: Angina; Arrhythmia; Congestive Heart Failure; Coronary Artery Disease; Deep Vein Thrombosis; Hypotension; Myocardial Infarction; Peripheral Arterial Disease; Peripheral Venous Disease; Phlebitis; Vasculitis Psychiatric Complaints and Symptoms: Negative for: Anxiety; Claustrophobia Medical History: Negative for: Anorexia/bulimia; Confinement Anxiety Eyes Medical History: Positive for: Cataracts - both Hematologic/Lymphatic Medical History: Negative for: Anemia; Hemophilia; Human Immunodeficiency Virus; Lymphedema; Sickle Cell Disease Gastrointestinal Medical History: Negative for: Cirrhosis ; Colitis; Crohnos; Hepatitis A; Hepatitis B; Hepatitis C Past Medical History Notes: [name], [name]  ([id number]) ulcer issues Endocrine Medical History: Negative for: Type I Diabetes; Type II Diabetes Genitourinary Medical History: Negative for: End Stage Renal Disease Immunological Medical History: Negative for: Lupus Erythematosus; Raynaudos; Scleroderma Integumentary (Skin) Medical History: Negative for: History of Burn; History of pressure wounds Musculoskeletal Medical History: Negative for: Gout; Rheumatoid Arthritis; Osteoarthritis Neurologic Medical History: Negative for: Dementia; Neuropathy; Quadriplegia; Paraplegia; Seizure Disorder Oncologic Medical History: Negative for: Received Chemotherapy HBO Extended History Items Eyes: Cataracts Immunizations Pneumococcal Vaccine: Received Pneumococcal Vaccination: Yes  Implantable Devices None Family and Social History Cancer: Yes - Father,Mother,Siblings; Diabetes: No; Heart Disease: No; Hereditary Spherocytosis: No; Hypertension: No; Kidney Disease: No; Lung Disease: No; Seizures: No; Stroke: No; Thyroid Problems: No; Tuberculosis: No; Never smoker; Marital Status - Widowed; Alcohol Use: Never; Drug Use: No History; Caffeine Use: Daily - coffee; Financial Concerns: No; Food, Clothing or Shelter Needs: No; Support System Lacking: No; Transportation Concerns: No Physician Affirmation I have reviewed and agree with the above information. Electronic Signature(s) [name], [name] ([id number]) Signed: 02/20/2019 4:47:50 PM By: Worthy Keeler PA-C Signed: 02/21/2019 4:09:41 PM By: Army Melia Entered By: Worthy Keeler on 02/20/2019 15:58:01 Gallaga, [name] ([id number]) -------------------------------------------------------------------------------- SuperBill Details Patient Name: Sandra Pair B. Date of Service: 02/20/2019 Medical Record Number: [id number] Patient Account Number: [id number] Date of Birth/Sex: Dec 24, 1920 (85 y.o. F) Treating RN: Army Melia Primary Care Provider: [name] Other  Clinician: Referring Provider: [name] Treating Provider/Extender: Melburn Hake, D'Arcy Abraha Weeks in Treatment: 10 Diagnosis Coding ICD-10 Codes Code Description I87.2 Venous insufficiency (chronic) (peripheral) L97.822 Non-pressure chronic ulcer of other part of left lower leg with fat layer exposed Facility Procedures CPT4 Code Description: [id number] 11042 - DEB SUBQ TISSUE 20 SQ CM/< ICD-10 Diagnosis Description L97.822 Non-pressure chronic ulcer of other part of left lower leg with Modifier: fat layer expos Quantity: 1 ed Physician Procedures CPT4 Code Description: [id number] 11042 - WC PHYS SUBQ TISS 20 SQ CM ICD-10 Diagnosis Description L97.822 Non-pressure chronic ulcer of other part of left lower leg with Modifier: fat layer expos Quantity: 1 ed Electronic Signature(s) Signed: 02/20/2019 4:04:11 PM By: Worthy Keeler PA-C Entered By: Worthy Keeler on 02/20/2019 16:04:11

## 2019-02-20 NOTE — Progress Notes (Addendum)
SHELANE, VERBECK ([id number]) Visit Report for 02/20/2019 Arrival Information Details Patient Name: [name], [name] Date of Service: 02/20/2019 3:30 PM Medical Record Number: [id number] Patient Account Number: [id number] Date of Birth/Sex: 08/14/1920 (85 y.o. F) Treating RN: Montey Hora Primary Care Samaya Boardley: [name] Other Clinician: Referring Nicolina Hirt: [name] Treating Taimane Stimmel/Extender: Melburn Hake, HOYT Weeks in Treatment: 10 Visit Information History Since Last Visit Added or deleted any medications: No Patient Arrived: Cane Any new allergies or adverse reactions: No Arrival Time: 15:35 Had a fall or experienced change in No Accompanied By: daughter activities of daily living that may affect Transfer Assistance: None risk of falls: Patient Identification Verified: Yes Signs or symptoms of abuse/neglect since last visito No Secondary Verification Process Completed: Yes Hospitalized since last visit: No Patient Requires Transmission-Based Precautions: No Implantable device outside of the clinic excluding No Patient Has Alerts: No cellular tissue based products placed in the center since last visit: Has Dressing in Place as Prescribed: Yes Pain Present Now: No Electronic Signature(s) Signed: 02/20/2019 4:37:34 PM By: Montey Hora Entered By: Montey Hora on 02/20/2019 15:36:06 [name], [name] ([id number]) -------------------------------------------------------------------------------- Encounter Discharge Information Details Patient Name: [name] Date of Service: 02/20/2019 3:30 PM Medical Record Number: [id number] Patient Account Number: [id number] Date of Birth/Sex: 1920-09-06 (85 y.o. F) Treating RN: Army Melia Primary Care Arshia Rondon: [name] Other Clinician: Referring Arnette Driggs: [name] Treating Margarita Croke/Extender: Melburn Hake, HOYT Weeks in Treatment: 10 Encounter Discharge Information Items Post Procedure Vitals Discharge  Condition: Stable Temperature (F): 98.6 Ambulatory Status: Ambulatory Pulse (bpm): 66 Discharge Destination: Home Respiratory Rate (breaths/min): 16 Transportation: Private Auto Blood Pressure (mmHg): 146/71 Accompanied By: daughter Schedule Follow-up Appointment: Yes Clinical Summary of Care: Electronic Signature(s) Signed: 02/21/2019 4:09:41 PM By: Army Melia Entered By: Army Melia on 02/20/2019 15:55:28 [name], [name] ([id number]) -------------------------------------------------------------------------------- Lower Extremity Assessment Details Patient Name: [name], [name] Date of Service: 02/20/2019 3:30 PM Medical Record Number: [id number] Patient Account Number: [id number] Date of Birth/Sex: [birth year] (85 y.o. F) Treating RN: Montey Hora Primary Care Leeam Cedrone: [name] Other Clinician: Referring Athene Schuhmacher: [name] Treating Rida Loudin/Extender: STONE III, HOYT Weeks in Treatment: 10 Edema Assessment Assessed: [Left: No] [Right: No] Edema: [Left: Ye] [Right: s] Vascular Assessment Pulses: Dorsalis Pedis Palpable: [Left:Yes] Electronic Signature(s) Signed: 02/20/2019 4:37:34 PM By: Montey Hora Entered By: Montey Hora on 02/20/2019 15:44:20 Mutschler, [name] ([id number]) -------------------------------------------------------------------------------- Multi Wound Chart Details Patient Name: [name], [name] Date of Service: 02/20/2019 3:30 PM Medical Record Number: [id number] Patient Account Number: [id number] Date of Birth/Sex: 02/18/1921 (85 y.o. F) Treating RN: Army Melia Primary Care Caley Ciaramitaro: [name] Other Clinician: Referring Davanna He: [name] Treating Aydrian Halpin/Extender: Melburn Hake, HOYT Weeks in Treatment: 10 Vital Signs Height(in): 64 Pulse(bpm): 66 Weight(lbs): 148 Blood Pressure(mmHg): 146/71 Body Mass Index(BMI): 25 Temperature(F): 98.6 Respiratory Rate 18 (breaths/min): Photos: [N/A:N/A] Wound Location: Left  Lower Leg - Anterior N/A N/A Wounding Event: Other Lesion N/A N/A Primary Etiology: Skin Tear N/A N/A Comorbid History: Cataracts, Hypertension N/A N/A Date Acquired: 11/06/2018 N/A N/A Weeks of Treatment: 10 N/A N/A Wound Status: Open N/A N/A Measurements L x W x D 1.3x0.4x0.1 N/A N/A (cm) Area (cm) : 0.408 N/A N/A Volume (cm) : 0.041 N/A N/A % Reduction in Area: 97.60% N/A N/A % Reduction in Volume: 97.60% N/A N/A Classification: Full Thickness Without N/A N/A Exposed Support Structures Exudate Amount: Medium N/A N/A Exudate Type: Serosanguineous N/A N/A Exudate Color: red, brown N/A N/A Wound Margin: Indistinct, nonvisible N/A N/A Granulation Amount: Large (67-100%) N/A  N/A Granulation Quality: Red, Hyper-granulation N/A N/A Necrotic Amount: Small (1-33%) N/A N/A Exposed Structures: Fat Layer (Subcutaneous N/A N/A Tissue) Exposed: Yes Fascia: No Tendon: No Muscle: No Joint: No Bone: No [name], [name] ([id number]) Epithelialization: None N/A N/A Treatment Notes Electronic Signature(s) Signed: 02/21/2019 4:09:41 PM By: Army Melia Entered By: Army Melia on 02/20/2019 15:49:22 [name], [name] ([id number]) -------------------------------------------------------------------------------- [place] Details Patient Name: [name] Date of Service: 02/20/2019 3:30 PM Medical Record Number: [id number] Patient Account Number: [id number] Date of Birth/Sex: 09-Aug-1920 (85 y.o. F) Treating RN: Army Melia Primary Care Erielle Gawronski: [name] Other Clinician: Referring Sherlin Sonier: [name] Treating Susie Ehresman/Extender: Melburn Hake, HOYT Weeks in Treatment: 10 Active Inactive Necrotic Tissue Nursing Diagnoses: Impaired tissue integrity related to necrotic/devitalized tissue Goals: Necrotic/devitalized tissue will be minimized in the wound bed Date Initiated: 12/19/2018 Target Resolution Date: 12/25/2018 Goal Status: Active Interventions: Assess  patient pain level pre-, during and post procedure and prior to discharge Treatment Activities: Enzymatic debridement : 12/19/2018 Notes: Orientation to the Wound Care Program Nursing Diagnoses: Knowledge deficit related to the wound healing center program Goals: Patient/caregiver will verbalize understanding of the [place] Date Initiated: 12/19/2018 Target Resolution Date: 12/25/2018 Goal Status: Active Interventions: Provide education on orientation to the wound center Notes: Pain, Acute or Chronic Nursing Diagnoses: Potential alteration in comfort, pain Goals: Patient will verbalize adequate pain control and receive pain control interventions during procedures as needed Date Initiated: 12/19/2018 Target Resolution Date: 12/25/2018 Goal Status: Active [name], [name] ([id number]) Interventions: Provide education on pain management Notes: Wound/Skin Impairment Nursing Diagnoses: Impaired tissue integrity Goals: Patient/caregiver will verbalize understanding of skin care regimen Date Initiated: 12/19/2018 Target Resolution Date: 12/25/2018 Goal Status: Active Interventions: Assess ulceration(s) every visit Treatment Activities: Skin care regimen initiated : 12/19/2018 Notes: Electronic Signature(s) Signed: 02/21/2019 4:09:41 PM By: Army Melia Entered By: Army Melia on 02/20/2019 15:49:13 [name], [name] ([id number]) -------------------------------------------------------------------------------- Pain Assessment Details Patient Name: [name] Date of Service: 02/20/2019 3:30 PM Medical Record Number: [id number] Patient Account Number: [id number] Date of Birth/Sex: 1921/02/23 (85 y.o. F) Treating RN: Montey Hora Primary Care Lyndi Holbein: [name] Other Clinician: Referring Chantell Kunkler: [name] Treating Deron Poole/Extender: Melburn Hake, HOYT Weeks in Treatment: 10 Active Problems Location of Pain Severity and Description of Pain Patient  Has Paino No Site Locations Pain Management and Medication Current Pain Management: Electronic Signature(s) Signed: 02/20/2019 4:37:34 PM By: Montey Hora Entered By: Montey Hora on 02/20/2019 15:37:42 [name], [name] ([id number]) -------------------------------------------------------------------------------- Patient/Caregiver Education Details Patient Name: [name] Date of Service: 02/20/2019 3:30 PM Medical Record Number: [id number] Patient Account Number: [id number] Date of Birth/Gender: [birth year] (85 y.o. F) Treating RN: Army Melia Primary Care Physician: [name] Other Clinician: Referring Physician: [name] Treating Physician/Extender: Melburn Hake, HOYT Weeks in Treatment: 10 Education Assessment Education Provided To: Patient Education Topics Provided Wound/Skin Impairment: Handouts: Caring for Your Ulcer Methods: Demonstration, Explain/Verbal Responses: State content correctly Electronic Signature(s) Signed: 02/21/2019 4:09:41 PM By: Army Melia Entered By: Army Melia on 02/20/2019 15:54:46 [name], [name] ([id number]) -------------------------------------------------------------------------------- Wound Assessment Details Patient Name: [name], [name] Date of Service: 02/20/2019 3:30 PM Medical Record Number: [id number] Patient Account Number: [id number] Date of Birth/Sex: 1921/03/22 (85 y.o. F) Treating RN: Montey Hora Primary Care Aislin Onofre: [name] Other Clinician: Referring Christepher Melchior: [name] Treating Aidric Endicott/Extender: Melburn Hake, HOYT Weeks in Treatment: 10 Wound Status Wound Number: 1 Primary Etiology: Skin Tear Wound Location: Left Lower Leg - Anterior Wound Status: Open Wounding Event: Other  Lesion Comorbid History: Cataracts, Hypertension Date Acquired: 11/06/2018 Weeks Of Treatment: 10 Clustered Wound: No Photos Wound Measurements Length: (cm) 1.3 Width: (cm) 0.4 Depth: (cm) 0.1 Area: (cm)  0.408 Volume: (cm) 0.041 % Reduction in Area: 97.6% % Reduction in Volume: 97.6% Epithelialization: None Tunneling: No Undermining: No Wound Description Full Thickness Without Exposed Support Classification: Structures Wound Margin: Indistinct, nonvisible Exudate Medium Amount: Exudate Type: Serosanguineous Exudate Color: red, brown Foul Odor After Cleansing: No Slough/Fibrino Yes Wound Bed Granulation Amount: Large (67-100%) Exposed Structure Granulation Quality: Red, Hyper-granulation Fascia Exposed: No Necrotic Amount: Small (1-33%) Fat Layer (Subcutaneous Tissue) Exposed: Yes Necrotic Quality: Adherent Slough Tendon Exposed: No Muscle Exposed: No Joint Exposed: No Bone Exposed: No [name], [name] ([id number]) Treatment Notes Wound #1 (Left, Anterior Lower Leg) Notes adaptic, hydrafera blue, abd and conform, tubigrip G Electronic Signature(s) Signed: 02/20/2019 4:37:34 PM By: Montey Hora Entered By: Montey Hora on 02/20/2019 15:43:06 Revels, [name] ([id number]) -------------------------------------------------------------------------------- Vitals Details Patient Name: [name] Date of Service: 02/20/2019 3:30 PM Medical Record Number: [id number] Patient Account Number: [id number] Date of Birth/Sex: 1920-11-12 (85 y.o. F) Treating RN: Montey Hora Primary Care Govanni Plemons: [name] Other Clinician: Referring Atlas Kuc: [name] Treating Leylah Tarnow/Extender: Melburn Hake, HOYT Weeks in Treatment: 10 Vital Signs Time Taken: 15:38 Temperature (F): 98.6 Height (in): 64 Pulse (bpm): 66 Weight (lbs): 148 Respiratory Rate (breaths/min): 18 Body Mass Index (BMI): 25.4 Blood Pressure (mmHg): 146/71 Reference Range: 80 - 120 mg / dl Electronic Signature(s) Signed: 02/20/2019 4:37:34 PM By: Montey Hora Entered By: Montey Hora on 02/20/2019 15:39:40

## 2019-02-22 LAB — URINE CULTURE

## 2019-02-26 ENCOUNTER — Telehealth: Payer: Self-pay

## 2019-02-26 MED ORDER — NITROFURANTOIN MONOHYD MACRO 100 MG PO CAPS: 100.0000 mg | ORAL_CAPSULE | Freq: Two times a day (BID) | ORAL | 0 refills | Status: DC

## 2019-02-26 NOTE — Telephone Encounter (Signed)
Spoke with pt's daughter and she is aware that her mother has an uti and medication will be sent to her pharmacy for treatment.

## 2019-02-27 ENCOUNTER — Other Ambulatory Visit: Payer: Self-pay

## 2019-02-27 ENCOUNTER — Encounter: Payer: Medicare Other | Attending: Physician Assistant | Admitting: Physician Assistant

## 2019-02-27 DIAGNOSIS — I872 Venous insufficiency (chronic) (peripheral): Secondary | ICD-10-CM | POA: Insufficient documentation

## 2019-02-27 DIAGNOSIS — Z853 Personal history of malignant neoplasm of breast: Secondary | ICD-10-CM | POA: Insufficient documentation

## 2019-02-27 DIAGNOSIS — L97822 Non-pressure chronic ulcer of other part of left lower leg with fat layer exposed: Secondary | ICD-10-CM | POA: Insufficient documentation

## 2019-02-27 DIAGNOSIS — I10 Essential (primary) hypertension: Secondary | ICD-10-CM | POA: Diagnosis not present

## 2019-02-27 DIAGNOSIS — Z85038 Personal history of other malignant neoplasm of large intestine: Secondary | ICD-10-CM | POA: Insufficient documentation

## 2019-02-27 DIAGNOSIS — E611 Iron deficiency: Secondary | ICD-10-CM | POA: Diagnosis not present

## 2019-02-27 DIAGNOSIS — L97829 Non-pressure chronic ulcer of other part of left lower leg with unspecified severity: Secondary | ICD-10-CM | POA: Diagnosis present

## 2019-02-27 NOTE — Progress Notes (Signed)
[name], [name] ([id number]) Visit Report for 02/27/2019 Arrival Information Details Patient Name: [name], [name] Date of Service: 02/27/2019 1:00 PM Medical Record Number: [id number] Patient Account Number: [id number] Date of Birth/Sex: 26-Jan-1921 (85 y.o. F) Treating RN: [name] Primary Care [name]: [name] Other Clinician: Referring [name]: [name] Treating [name]: 11 Visit Information History Since Last Visit Added or deleted any medications: No Patient Arrived: Cane Any new allergies or adverse reactions: No Arrival Time: 13:04 Had a fall or experienced change in No Accompanied By: daughter activities of daily living that may affect Transfer Assistance: None risk of falls: Patient Identification Verified: Yes Signs or symptoms of abuse/neglect since last visito No Secondary Verification Process Completed: Yes Hospitalized since last visit: No Patient Requires Transmission-Based Precautions: No Implantable device outside of the clinic excluding No Patient Has Alerts: No cellular tissue based products placed in the center since last visit: Has Dressing in Place as Prescribed: Yes Has Compression in Place as Prescribed: No Pain Present Now: No Electronic Signature(s) Signed: 02/27/2019 5:10:54 PM By: [name] Entered By: [name] on 02/27/2019 13:04:56 [name], [name] ([id number]) -------------------------------------------------------------------------------- Clinic Level of Care Assessment Details Patient Name: Sandra Pair B. Date of Service: 02/27/2019 1:00 PM Medical Record Number: [id number] Patient Account Number: [id number] Date of Birth/Sex: Sep 13, 1920 (85 y.o. F) Treating RN: [name] Primary Care [name]: [name] Other Clinician: Referring [name]: [name] Treating [name] Weeks in Weeks: 11 Clinic Level of Care Assessment  Items TOOL 4 Quantity Score []  - Use when only an EandM is performed on FOLLOW-UP visit 0 ASSESSMENTS - Nursing Assessment / Reassessment X - Reassessment of Co-morbidities (includes updates in patient status) 1 10 X- 1 5 Reassessment of Adherence to Weeks Plan ASSESSMENTS - Wound and Skin Assessment / Reassessment X - Simple Wound Assessment / Reassessment - one wound 1 5 []  - 0 Complex Wound Assessment / Reassessment - multiple wounds []  - 0 Dermatologic / Skin Assessment (not related to wound area) ASSESSMENTS - Focused Assessment []  - Circumferential Edema Measurements - multi extremities 0 []  - 0 Nutritional Assessment / Counseling / Intervention []  - 0 Lower Extremity Assessment (monofilament, tuning fork, pulses) []  - 0 Peripheral Arterial Disease Assessment (using hand held doppler) ASSESSMENTS - Ostomy and/or Continence Assessment and Care []  - Incontinence Assessment and Management 0 []  - 0 Ostomy Care Assessment and Management (repouching, etc.) PROCESS - Coordination of Care X - Simple Patient / Family Education for ongoing care 1 15 []  - 0 Complex (extensive) Patient / Family Education for ongoing care X- 1 10 Staff obtains Programmer, systems, Records, Test Results / Process Orders []  - 0 Staff telephones HHA, Nursing Homes / Clarify orders / etc []  - 0 Routine Transfer to another Facility (non-emergent condition) []  - 0 Routine Hospital Admission (non-emergent condition) []  - 0 New Admissions / Biomedical engineer / Ordering NPWT, Apligraf, etc. []  - 0 Emergency Hospital Admission (emergent condition) X- 1 10 Simple Discharge Coordination [name], [name] ([id number]) []  - 0 Complex (extensive) Discharge Coordination PROCESS - Special Needs []  - Pediatric / Minor Patient Management 0 []  - 0 Isolation Patient Management []  - 0 Hearing / Language / Visual special needs []  - 0 Assessment of Community assistance (transportation, D/C planning, etc.) []  -  0 Additional assistance / Altered mentation []  - 0 Support Surface(s) Assessment (bed, cushion, seat, etc.) INTERVENTIONS - Wound Cleansing / Measurement X - Simple Wound Cleansing - one wound  1 5 []  - 0 Complex Wound Cleansing - multiple wounds X- 1 5 Wound Imaging (photographs - any number of wounds) []  - 0 Wound Tracing (instead of photographs) X- 1 5 Simple Wound Measurement - one wound []  - 0 Complex Wound Measurement - multiple wounds INTERVENTIONS - Wound Dressings X - Small Wound Dressing one or multiple wounds 1 10 []  - 0 Medium Wound Dressing one or multiple wounds []  - 0 Large Wound Dressing one or multiple wounds []  - 0 Application of Medications - topical []  - 0 Application of Medications - injection INTERVENTIONS - Miscellaneous []  - External ear exam 0 []  - 0 Specimen Collection (cultures, biopsies, blood, body fluids, etc.) []  - 0 Specimen(s) / Culture(s) sent or taken to Lab for analysis []  - 0 Patient Transfer (multiple staff / Civil Service fast streamer / Similar devices) []  - 0 Simple Staple / Suture removal (25 or less) []  - 0 Complex Staple / Suture removal (26 or more) []  - 0 Hypo / Hyperglycemic Management (close monitor of Blood Glucose) []  - 0 Ankle / Brachial Index (ABI) - do not check if billed separately X- 1 5 Vital Signs [name], [name] ([id number]) Has the patient been seen at the hospital within the last three years: Yes Total Score: 85 Level Of Care: New/Established - Level 3 Electronic Signature(s) Signed: 02/27/2019 5:01:07 PM By: [name] Entered By: [name] on 02/27/2019 13:32:18 [name], [name] ([id number]) -------------------------------------------------------------------------------- Encounter Discharge Information Details Patient Name: Sandra Pair B. Date of Service: 02/27/2019 1:00 PM Medical Record Number: [id number] Patient Account Number: [id number] Date of Birth/Sex: 17-Jan-1921 (85 y.o. F) Treating RN: [name] Primary Care Kinzey Sheriff: [name] Other Clinician: Referring Khadim Lundberg: [name] Treating Jorgina Binning/Extender: Melburn Hake, Weeks Weeks in Weeks: 11 Encounter Discharge Information Items Discharge Condition: Stable Ambulatory Status: Ambulatory Discharge Destination: Home Transportation: Private Auto Accompanied By: daughter Schedule Follow-up Appointment: Yes Clinical Summary of Care: Electronic Signature(s) Signed: 02/27/2019 5:01:07 PM By: [name] Entered By: [name] on 02/27/2019 13:32:51 Dieudonne, [name] ([id number]) -------------------------------------------------------------------------------- Lower Extremity Assessment Details Patient Name: [name], [name] Date of Service: 02/27/2019 1:00 PM Medical Record Number: [id number] Patient Account Number: [id number] Date of Birth/Sex: 12-Aug-1920 (85 y.o. F) Treating RN: [name] Primary Care Thomson Herbers: [name] Other Clinician: Referring Lilibeth Opie: [name] Treating Filomeno Cromley/Extender: STONE III, Weeks Weeks in Weeks: 11 Edema Assessment Assessed: [Left: No] [Right: No] Edema: [Left: Ye] [Right: s] Vascular Assessment Pulses: Dorsalis Pedis Palpable: [Left:Yes] Electronic Signature(s) Signed: 02/27/2019 5:10:54 PM By: [name] Entered By: [name] on 02/27/2019 13:09:49 [name], [name] ([id number]) -------------------------------------------------------------------------------- Multi Wound Chart Details Patient Name: [name], [name] Date of Service: 02/27/2019 1:00 PM Medical Record Number: [id number] Patient Account Number: [id number] Date of Birth/Sex: 10-18-1920 (85 y.o. F) Treating RN: [name] Primary Care Marik Sedore: [name] Other Clinician: Referring Latorie Montesano: [name] Treating Verlon Carcione/Extender: Melburn Hake, Weeks Weeks in Weeks: 11 Vital Signs Height(in): 64 Pulse(bpm): 71 Weight(lbs): 148 Blood Pressure(mmHg): 138/83 Body Mass Index(BMI):  25 Temperature(F): 98.8 Respiratory Rate 20 (breaths/min): Photos: [N/A:N/A] Wound Location: Left, Anterior Lower Leg N/A N/A Wounding Event: Other Lesion N/A N/A Primary Etiology: Skin Tear N/A N/A Comorbid History: Cataracts, Hypertension N/A N/A Date Acquired: 11/06/2018 N/A N/A Weeks of Weeks: 11 N/A N/A Wound Status: Healed - Epithelialized N/A N/A Measurements L x W x D 0x0x0 N/A N/A (cm) Area (cm) : 0 N/A N/A Volume (cm) : 0 N/A N/A % Reduction in Area: 100.00% N/A N/A % Reduction in Volume:  100.00% N/A N/A Classification: Full Thickness Without N/A N/A Exposed Support Structures Exudate Amount: None Present N/A N/A Wound Margin: Indistinct, nonvisible N/A N/A Granulation Amount: None Present (0%) N/A N/A Necrotic Amount: None Present (0%) N/A N/A Exposed Structures: Fascia: No N/A N/A Fat Layer (Subcutaneous Tissue) Exposed: No Tendon: No Muscle: No Joint: No Bone: No Limited to Skin Breakdown Epithelialization: Large (67-100%) N/A N/A [name], [name] Kitchen ([id number]) Weeks Notes Electronic Signature(s) Signed: 02/27/2019 5:01:07 PM By: [name] Entered By: [name] on 02/27/2019 13:31:26 [name], [name] ([id number]) -------------------------------------------------------------------------------- [place] Details Patient Name: Sandra Pair B. Date of Service: 02/27/2019 1:00 PM Medical Record Number: [id number] Patient Account Number: [id number] Date of Birth/Sex: [birth year] (85 y.o. F) Treating RN: [name] Primary Care Ada Holness: [name] Other Clinician: Referring Soma Lizak: [name] Treating Macaiah Mangal/Extender: Melburn Hake, Weeks Weeks in Weeks: 11 Active Inactive Electronic Signature(s) Signed: 02/27/2019 5:01:07 PM By: [name] Entered By: [name] on 02/27/2019 13:31:18 [name], [name] ([id number]) -------------------------------------------------------------------------------- Pain  Assessment Details Patient Name: [name], [name] Date of Service: 02/27/2019 1:00 PM Medical Record Number: [id number] Patient Account Number: [id number] Date of Birth/Sex: 1920-12-10 (85 y.o. F) Treating RN: [name] Primary Care Earlyn Sylvan: [name] Other Clinician: Referring Ulmer Degen: [name] Treating Paizlee Kinder/Extender: Melburn Hake, Weeks Weeks in Weeks: 11 Active Problems Location of Pain Severity and Description of Pain Patient Has Paino No Site Locations Pain Management and Medication Current Pain Management: Electronic Signature(s) Signed: 02/27/2019 5:10:54 PM By: [name] Entered By: [name] on 02/27/2019 13:05:03 [name], [name] ([id number]) -------------------------------------------------------------------------------- Patient/Caregiver Education Details Patient Name: Sandra Pair B. Date of Service: 02/27/2019 1:00 PM Medical Record Number: [id number] Patient Account Number: [id number] Date of Birth/Gender: 05-13-1921 (85 y.o. F) Treating RN: [name] Primary Care Physician: [name] Other Clinician: Referring Physician: [place] Weeks Treating Physician/Extender: Sharalyn Ink in Weeks: 11 Education Assessment Education Provided To: Patient Education Topics Provided Wound/Skin Impairment: Handouts: Caring for Your Ulcer Methods: Demonstration, Explain/Verbal Responses: State content correctly Electronic Signature(s) Signed: 02/27/2019 5:01:07 PM By: [name] Entered By: [name] on 02/27/2019 13:32:31 [name], [name] ([id number]) -------------------------------------------------------------------------------- Wound Assessment Details Patient Name: Santini, Verneice B. Date of Service: 02/27/2019 1:00 PM Medical Record Number: [id number] Patient Account Number: [id number] Date of Birth/Sex: 08-Jun-1920 (85 y.o. F) Treating RN: [name] Primary Care Ygnacio Fecteau: [name] Other Clinician: Referring  Avelino Herren: [name] Treating Joseh Sjogren/Extender: Melburn Hake, Weeks Weeks in Weeks: 11 Wound Status Wound Number: 1 Primary Etiology: Skin Tear Wound Location: Left, Anterior Lower Leg Wound Status: Healed - Epithelialized Wounding Event: Other Lesion Comorbid History: Cataracts, Hypertension Date Acquired: 11/06/2018 Weeks Of Weeks: 11 Clustered Wound: No Photos Wound Measurements Length: (cm) 0 % Reducti Width: (cm) 0 % Reducti Depth: (cm) 0 Epithelia Area: (cm) 0 Tunnelin Volume: (cm) 0 Undermin on in Area: 100% on in Volume: 100% lization: Large (67-100%) g: No ing: No Wound Description Full Thickness Without Exposed Support Classification: Structures Wound Margin: Indistinct, nonvisible Exudate None Present Amount: Foul Odor After Cleansing: No Slough/Fibrino No Wound Bed Granulation Amount: None Present (0%) Exposed Structure Necrotic Amount: None Present (0%) Fascia Exposed: No Fat Layer (Subcutaneous Tissue) Exposed: No Tendon Exposed: No Muscle Exposed: No Joint Exposed: No Bone Exposed: No Limited to Skin Breakdown Electronic Signature(s) SAPHIRA, MESSANA ([id number]) Signed: 02/27/2019 5:01:07 PM By: [name] Entered By: [name] on 02/27/2019 13:30:35 Motl, [name] ([id number]) -------------------------------------------------------------------------------- Vitals Details Patient Name: Sandra Pair B. Date of Service: 02/27/2019 1:00 PM Medical  Record Number: [id number] Patient Account Number: [id number] Date of Birth/Sex: 01-13-1921 (85 y.o. F) Treating RN: [name] Primary Care Micai Apolinar: [name] Other Clinician: Referring Nyela Cortinas: [name] Treating Ramzi Brathwaite/Extender: Melburn Hake, Weeks Weeks in Weeks: 11 Vital Signs Time Taken: 13:05 Temperature (F): 98.8 Height (in): 64 Pulse (bpm): 71 Weight (lbs): 148 Respiratory Rate (breaths/min): 20 Body Mass Index (BMI): 25.4 Blood Pressure (mmHg):  138/83 Reference Range: 80 - 120 mg / dl Electronic Signature(s) Signed: 02/27/2019 5:10:54 PM By: [name] Entered By: [name] on 02/27/2019 13:06:14

## 2019-02-27 NOTE — Progress Notes (Addendum)
[name], [name] ([id number]) Visit Report for 02/27/2019 Chief Complaint Document Details Patient Name: Weeks, [name] Date of Service: 02/27/2019 1:00 PM Medical Record Number: [id number] Patient Account Number: [id number] Date of Birth/Sex: [birth year] (85 y.o. F) Treating RN: Army Melia Primary Care Provider: [name] Other Clinician: Referring Provider: [name] Treating Provider/Extender: Melburn Hake, Sharlena Kristensen Weeks in Treatment: 11 Information Obtained from: Patient Chief Complaint Left leg wound, referred by her Surgeon Electronic Signature(s) Signed: 02/27/2019 1:25:14 PM By: Worthy Keeler PA-C Entered By: Worthy Keeler on 02/27/2019 13:25:14 Dymond, Jackie Plum ([id number]) -------------------------------------------------------------------------------- HPI Details Patient Name: Sandra Pair B. Date of Service: 02/27/2019 1:00 PM Medical Record Number: [id number] Patient Account Number: [id number] Date of Birth/Sex: Feb 01, 1921 (85 y.o. F) Treating RN: Army Melia Primary Care Provider: [name] Other Clinician: Referring Provider: [name] Treating Provider/Extender: Melburn Hake, Barak Bialecki Weeks in Treatment: 11 History of Present Illness HPI Description: 85 year old female who developed a left anterior leg skin tear more than a month ago with a flap on it, this first started out when family were helping her put on a pair of pants and the edge of the pant leg scraped and pulled off in the area of skin on the left anterior shin. She was seen at an urgent care for the skin tear where the used a scissors to cut the loose lying skin flap on top of the wound. To give her course of antibiotics which I believe was for 10 days that she completed almost a month ago. Since then the wound had been scabbed but she experienced a lot of pain in this area family has been using Vaseline on this area after using Neosporin for some time and she is referred to the clinic for further  care of the wound with scab on it. Patient denies any fevers chills shakes. She is ambulatory and has family assist her when needed. Patient is at her baseline in terms of her activity level and functional status other than experiencing some pain in the left leg around the scab. ABIs were not obtained today on the left on account of significant pain associated with the scabbing wound Patient has history of breast cancer status post mastectomy in remission, colon cancer status post colectomy in remission, iron deficiency for which she gets iron infusions, hypertension, she is not on any blood thinners 12/19/18 on evaluation today patient actually appears to be redoing okay in regard to the skin tear on the left anterior lower extremity. With that being said this is still showing signs of a significant amount of necrotic tissue on the surface of the wound unfortunately. She's been "trying to let this scab" which obviously is not the right thing at this point. I do think that cental in keeping this area covered is gonna be the ideal way to get this to heal most appropriately. Obviously I explained to the patient as well as her son who was present during the evaluation today that we really need to try and get the necrotic tissue off of the surface of the wound in order to let this heal appropriately. Fortunately there's no signs of active infection. 12/26/2018 upon evaluation today patient appears to be doing better with regard to her left lower extremity ulcer. She has been tolerating the dressing changes specifically with the Santyl without complication. Fortunately there is no signs of active infection at this time. No fevers, chills, nausea, vomiting, or diarrhea. The dry dark eschar is loosening up and does seem to  be doing much better today which is great news. Overall I am pleased with the progress. 01/02/19 on evaluation today patient actually appears to be doing excellent in regard to her lower  extremity ulcer. She's been tolerating the dressing changes without complication. Fortunately the slough is loosening up and I do believe that the Santyl has been very beneficial in this regard. With that being said I did take almost a week for them to get the medication from the pharmacy [facility] seems to take forever to get this in stock. Other than that I'm overall pleased with how things again are appearing today. 01/09/2019 upon evaluation today patient appears to be doing quite well with regard to her left anterior lower extremity ulcer. She has been tolerating the dressing changes without complication. Fortunately there is no signs of active infection at this time. She does have some slough noted on the surface of the wound but overall this appears to be doing much better in my opinion. 01/16/2019 on evaluation today patient appears to be doing well with regard to her left lower extremity ulcer. She has been tolerating the dressing changes without complication and very pleased with the progress that she seems to be making. Fortunately there is no signs of active infection at this time. I do believe it may be time to switch to a different dressing I am thinking a Hydrofera Blue dressing would be beneficial. 01/23/19 on evaluation today patient appears to be doing excellent in regard to her left lower Trinity ulcer. She's been tolerating the dressing changes without complication. Fortunately there is no sign of active infection at this time. She's also having no significant pain which is excellent news. 01/31/19 on evaluation today patient's wound bed actually showed signs of excellent improvement at this time. There was a Imm, [name] ([id number]) much smaller measurement noted. She does have issues with some slough noted at this point which did require sharp debridement post debridement the wound bed appears to be doing much better which is great news. Overall I am extremely happy with the  progress she seems to be making. The patient is likewise pleased as is her family member. 02/13/2019 on evaluation today patient appears to be doing much better with regard to her wound on the left lower extremity. She had a little bit of a setback and that the [facility] was somewhat stuck she was not used to that being the case. When she went to remove it she inadvertently tore her skin proximally and had quite a bit of bleeding she called her daughter over when this happened subsequently she has not really been putting any moisture on the dressing before removing it because she has not had to she had so much drainage. Now that this is starting to slow down from a drainage perspective she is probably can need to be a little bit more cautious with making sure she wets the dressing very well before removal in order to prevent any complications. 02/20/2019 on evaluation today patient appears to be doing quite well with regard to her left anterior lower extremity ulcer. This is healing nicely and in fact is becoming more dried to the point that it is difficult to get the dressing off at times with regard to the [facility]. She is very close I believe based on what I am seeing to complete closure. 02/27/2019 on evaluation today patient appears to be doing excellent in regard to her lower extremity ulcer. This in fact is showing signs  of excellent improvement and there does not appear to be any evidence of active infection which is good news. No fevers, chills, nausea, vomiting, or diarrhea. Overall I am very pleased with how things have progressed. Electronic Signature(s) Signed: 02/27/2019 1:53:39 PM By: Worthy Keeler PA-C Entered By: Worthy Keeler on 02/27/2019 13:53:39 Clear, Jackie Plum ([id number]) -------------------------------------------------------------------------------- Physical Exam Details Patient Name: Noll, Marlys B. Date of Service: 02/27/2019 1:00 PM Medical Record Number:  [id number] Patient Account Number: [id number] Date of Birth/Sex: 09-Aug-1920 (85 y.o. F) Treating RN: Army Melia Primary Care Provider: [name] Other Clinician: Referring Provider: [name] Treating Provider/Extender: Melburn Hake, Emry Tobin Weeks in Treatment: 60 Constitutional Well-nourished and well-hydrated in no acute distress. Respiratory normal breathing without difficulty. Psychiatric this patient is able to make decisions and demonstrates good insight into disease process. Alert and Oriented x 3. pleasant and cooperative. Notes Patient's wound bed currently showed signs of excellent epithelization there does not appear to be any open wound at this time there was a little bit of dry skin on the surface that was gently removed today and the patient seems to be doing excellent following. There is no wound opening at this time. Nonetheless I do think this still needs to be protected for a time. Electronic Signature(s) Signed: 02/27/2019 1:56:28 PM By: Worthy Keeler PA-C Entered By: Worthy Keeler on 02/27/2019 13:56:27 Borello, Jackie Plum ([id number]) -------------------------------------------------------------------------------- Physician Orders Details Patient Name: Sandra Pair B. Date of Service: 02/27/2019 1:00 PM Medical Record Number: [id number] Patient Account Number: [id number] Date of Birth/Sex: June 04, 1920 (85 y.o. F) Treating RN: Army Melia Primary Care Provider: [name] Other Clinician: Referring Provider: [name] Treating Provider/Extender: Melburn Hake, Howard Bunte Weeks in Treatment: 11 Verbal / Phone Orders: No Diagnosis Coding ICD-10 Coding Code Description I87.2 Venous insufficiency (chronic) (peripheral) L97.822 Non-pressure chronic ulcer of other part of left lower leg with fat layer exposed Discharge From [place] [facility] Discharge from [place] - treatment complete Electronic Signature(s) Signed: 02/27/2019 5:01:07 PM By: Army Melia Signed: 02/27/2019 5:50:00 PM By: Worthy Keeler PA-C Entered By: Army Melia on 02/27/2019 13:31:47 Rude, Jackie Plum ([id number]) -------------------------------------------------------------------------------- Problem List Details Patient Name: Hoefer, Kida B. Date of Service: 02/27/2019 1:00 PM Medical Record Number: [id number] Patient Account Number: [id number] Date of Birth/Sex: December 29, 1920 (85 y.o. F) Treating RN: Army Melia Primary Care Provider: [name] Other Clinician: Referring Provider: [name] Treating Provider/Extender: Melburn Hake, Jaynie Hitch Weeks in Treatment: 11 Active Problems ICD-10 Evaluated Encounter Code Description Active Date Today Diagnosis I87.2 Venous insufficiency (chronic) (peripheral) 12/10/2018 No Yes L97.822 Non-pressure chronic ulcer of other part of left lower leg with 12/10/2018 No Yes fat layer exposed Inactive Problems Resolved Problems Electronic Signature(s) Signed: 02/27/2019 1:25:05 PM By: Worthy Keeler PA-C Entered By: Worthy Keeler on 02/27/2019 13:25:05 Ohaver, Jackie Plum ([id number]) -------------------------------------------------------------------------------- Progress Note Details Patient Name: Thibeaux, [name] Date of Service: 02/27/2019 1:00 PM Medical Record Number: [id number] Patient Account Number: [id number] Date of Birth/Sex: 1920-09-11 (85 y.o. F) Treating RN: Army Melia Primary Care Provider: [name] Other Clinician: Referring Provider: [name] Treating Provider/Extender: Melburn Hake, Shanik Brookshire Weeks in Treatment: 11 Subjective Chief Complaint Information obtained from Patient Left leg wound, referred by her Surgeon History of Present Illness (HPI) 85 year old female who developed a left anterior leg skin tear more than a month ago with a flap on it, this first started out when family were helping her put on a pair of pants and the edge of the  pant leg scraped and pulled off in the area of skin  on the left anterior shin. She was seen at an urgent care for the skin tear where the used a scissors to cut the loose lying skin flap on top of the wound. To give her course of antibiotics which I believe was for 10 days that she completed almost a month ago. Since then the wound had been scabbed but she experienced a lot of pain in this area family has been using Vaseline on this area after using Neosporin for some time and she is referred to the clinic for further care of the wound with scab on it. Patient denies any fevers chills shakes. She is ambulatory and has family assist her when needed. Patient is at her baseline in terms of her activity level and functional status other than experiencing some pain in the left leg around the scab. ABIs were not obtained today on the left on account of significant pain associated with the scabbing wound Patient has history of breast cancer status post mastectomy in remission, colon cancer status post colectomy in remission, iron deficiency for which she gets iron infusions, hypertension, she is not on any blood thinners 12/19/18 on evaluation today patient actually appears to be redoing okay in regard to the skin tear on the left anterior lower extremity. With that being said this is still showing signs of a significant amount of necrotic tissue on the surface of the wound unfortunately. She's been "trying to let this scab" which obviously is not the right thing at this point. I do think that cental in keeping this area covered is gonna be the ideal way to get this to heal most appropriately. Obviously I explained to the patient as well as her son who was present during the evaluation today that we really need to try and get the necrotic tissue off of the surface of the wound in order to let this heal appropriately. Fortunately there's no signs of active infection. 12/26/2018 upon evaluation today patient appears to be doing better with regard to her left  lower extremity ulcer. She has been tolerating the dressing changes specifically with the Santyl without complication. Fortunately there is no signs of active infection at this time. No fevers, chills, nausea, vomiting, or diarrhea. The dry dark eschar is loosening up and does seem to be doing much better today which is great news. Overall I am pleased with the progress. 01/02/19 on evaluation today patient actually appears to be doing excellent in regard to her lower extremity ulcer. She's been tolerating the dressing changes without complication. Fortunately the slough is loosening up and I do believe that the Santyl has been very beneficial in this regard. With that being said I did take almost a week for them to get the medication from the pharmacy [facility] seems to take forever to get this in stock. Other than that I'm overall pleased with how things again are appearing today. 01/09/2019 upon evaluation today patient appears to be doing quite well with regard to her left anterior lower extremity ulcer. She has been tolerating the dressing changes without complication. Fortunately there is no signs of active infection at this time. She does have some slough noted on the surface of the wound but overall this appears to be doing much better in my opinion. 01/16/2019 on evaluation today patient appears to be doing well with regard to her left lower extremity ulcer. She has been tolerating the dressing changes without complication and  very pleased with the progress that she seems to be making. Fortunately there is no signs of active infection at this time. I do believe it may be time to switch to a different dressing I am thinking a Hydrofera Blue dressing would be beneficial. Panik, Phylliss B. ([id number]) 01/23/19 on evaluation today patient appears to be doing excellent in regard to her left lower Trinity ulcer. She's been tolerating the dressing changes without complication. Fortunately there is no  sign of active infection at this time. She's also having no significant pain which is excellent news. 01/31/19 on evaluation today patient's wound bed actually showed signs of excellent improvement at this time. There was a much smaller measurement noted. She does have issues with some slough noted at this point which did require sharp debridement post debridement the wound bed appears to be doing much better which is great news. Overall I am extremely happy with the progress she seems to be making. The patient is likewise pleased as is her family member. 02/13/2019 on evaluation today patient appears to be doing much better with regard to her wound on the left lower extremity. She had a little bit of a setback and that the [facility] was somewhat stuck she was not used to that being the case. When she went to remove it she inadvertently tore her skin proximally and had quite a bit of bleeding she called her daughter over when this happened subsequently she has not really been putting any moisture on the dressing before removing it because she has not had to she had so much drainage. Now that this is starting to slow down from a drainage perspective she is probably can need to be a little bit more cautious with making sure she wets the dressing very well before removal in order to prevent any complications. 02/20/2019 on evaluation today patient appears to be doing quite well with regard to her left anterior lower extremity ulcer. This is healing nicely and in fact is becoming more dried to the point that it is difficult to get the dressing off at times with regard to the [facility]. She is very close I believe based on what I am seeing to complete closure. 02/27/2019 on evaluation today patient appears to be doing excellent in regard to her lower extremity ulcer. This in fact is showing signs of excellent improvement and there does not appear to be any evidence of active infection which is good  news. No fevers, chills, nausea, vomiting, or diarrhea. Overall I am very pleased with how things have progressed. Patient History Information obtained from Patient. Family History Cancer - Father,Mother,Siblings, No family history of Diabetes, Heart Disease, Hereditary Spherocytosis, Hypertension, Kidney Disease, Lung Disease, Seizures, Stroke, Thyroid Problems, Tuberculosis. Social History Never smoker, Marital Status - Widowed, Alcohol Use - Never, Drug Use - No History, Caffeine Use - Daily - coffee. Medical History Eyes Patient has history of Cataracts - both Hematologic/Lymphatic Denies history of Anemia, Hemophilia, Human Immunodeficiency Virus, Lymphedema, Sickle Cell Disease Respiratory Denies history of Aspiration, Asthma, Chronic Obstructive Pulmonary Disease (COPD), Pneumothorax, Sleep Apnea, Tuberculosis Cardiovascular Patient has history of Hypertension Denies history of Angina, Arrhythmia, Congestive Heart Failure, Coronary Artery Disease, Deep Vein Thrombosis, Hypotension, Myocardial Infarction, Peripheral Arterial Disease, Peripheral Venous Disease, Phlebitis, Vasculitis Gastrointestinal Denies history of Cirrhosis , Colitis, Crohn s, Hepatitis A, Hepatitis B, Hepatitis C Endocrine Denies history of Type I Diabetes, Type II Diabetes Genitourinary Denies history of End Stage Renal Disease Immunological Denies history of Lupus  Erythematosus, Raynaud s, Scleroderma Integumentary (Skin) Denies history of History of Burn, History of pressure wounds Musculoskeletal Haslem, Adelie B. ([id number]) Denies history of Gout, Rheumatoid Arthritis, Osteoarthritis Neurologic Denies history of Dementia, Neuropathy, Quadriplegia, Paraplegia, Seizure Disorder Oncologic Denies history of Received Chemotherapy Psychiatric Denies history of Anorexia/bulimia, Confinement Anxiety Medical And Surgical History Notes Gastrointestinal ulcer issues Review of Systems  (ROS) Constitutional Symptoms (General Health) Denies complaints or symptoms of Fatigue, Fever, Chills, Marked Weight Change. Respiratory Denies complaints or symptoms of Chronic or frequent coughs, Shortness of Breath. Cardiovascular Denies complaints or symptoms of Chest pain, LE edema. Psychiatric Denies complaints or symptoms of Anxiety, Claustrophobia. Objective Constitutional Well-nourished and well-hydrated in no acute distress. Vitals Time Taken: 1:05 PM, Height: 64 in, Weight: 148 lbs, BMI: 25.4, Temperature: 98.8 F, Pulse: 71 bpm, Respiratory Rate: 20 breaths/min, Blood Pressure: 138/83 mmHg. Respiratory normal breathing without difficulty. Psychiatric this patient is able to make decisions and demonstrates good insight into disease process. Alert and Oriented x 3. pleasant and cooperative. General Notes: Patient's wound bed currently showed signs of excellent epithelization there does not appear to be any open wound at this time there was a little bit of dry skin on the surface that was gently removed today and the patient seems to be doing excellent following. There is no wound opening at this time. Nonetheless I do think this still needs to be protected for a time. Integumentary (Hair, Skin) Wound #1 status is Healed - Epithelialized. Original cause of wound was Other Lesion. The wound is located on the Left,Anterior Lower Leg. The wound measures 0cm length x 0cm width x 0cm depth; 0cm^2 area and 0cm^3 volume. The wound is limited to skin breakdown. There is no tunneling or undermining noted. There is a none present amount of drainage noted. The wound margin is indistinct and nonvisible. There is no granulation within the wound bed. There is no necrotic tissue within the wound bed. AYVREE, RICKER ([id number]) Assessment Active Problems ICD-10 Venous insufficiency (chronic) (peripheral) Non-pressure chronic ulcer of other part of left lower leg with fat layer  exposed Plan Discharge From [facility]: Discharge from [place] - treatment complete 1. I would recommend at this point we discontinue wound care services as the patient appears to have healed in an excellent fashion. 2. I am going to recommend the patient for 2 weeks still continue to put a protective dressing over the wound in order to allow this to heal appropriately and not cause any additional complications or problems. I will see the patient back for follow-up visit as needed if anything changes or worsens Electronic Signature(s) Signed: 02/27/2019 1:57:02 PM By: Worthy Keeler PA-C Entered By: Worthy Keeler on 02/27/2019 13:57:02 Wyche, Jackie Plum ([id number]) -------------------------------------------------------------------------------- ROS/PFSH Details Patient Name: Sandra Pair B. Date of Service: 02/27/2019 1:00 PM Medical Record Number: [id number] Patient Account Number: [id number] Date of Birth/Sex: [birth year] (85 y.o. F) Treating RN: Army Melia Primary Care Provider: [name] Other Clinician: Referring Provider: [name] Treating Provider/Extender: Melburn Hake, Ashby Moskal Weeks in Treatment: 11 Information Obtained From Patient Constitutional Symptoms (General Health) Complaints and Symptoms: Negative for: Fatigue; Fever; Chills; Marked Weight Change Respiratory Complaints and Symptoms: Negative for: Chronic or frequent coughs; Shortness of Breath Medical History: Negative for: Aspiration; Asthma; Chronic Obstructive Pulmonary Disease (COPD); Pneumothorax; Sleep Apnea; Tuberculosis Cardiovascular Complaints and Symptoms: Negative for: Chest pain; LE edema Medical History: Positive for: Hypertension Negative for: Angina; Arrhythmia; Congestive Heart Failure; Coronary Artery Disease; Deep Vein Thrombosis; Hypotension;  Myocardial Infarction; Peripheral Arterial Disease; Peripheral Venous Disease; Phlebitis; Vasculitis Psychiatric Complaints and  Symptoms: Negative for: Anxiety; Claustrophobia Medical History: Negative for: Anorexia/bulimia; Confinement Anxiety Eyes Medical History: Positive for: Cataracts - both Hematologic/Lymphatic Medical History: Negative for: Anemia; Hemophilia; Human Immunodeficiency Virus; Lymphedema; Sickle Cell Disease Gastrointestinal Medical History: Negative for: Cirrhosis ; Colitis; Crohnos; Hepatitis A; Hepatitis B; Hepatitis C Past Medical History Notes: KAISHA, CULLIGAN ([id number]) ulcer issues Endocrine Medical History: Negative for: Type I Diabetes; Type II Diabetes Genitourinary Medical History: Negative for: End Stage Renal Disease Immunological Medical History: Negative for: Lupus Erythematosus; Raynaudos; Scleroderma Integumentary (Skin) Medical History: Negative for: History of Burn; History of pressure wounds Musculoskeletal Medical History: Negative for: Gout; Rheumatoid Arthritis; Osteoarthritis Neurologic Medical History: Negative for: Dementia; Neuropathy; Quadriplegia; Paraplegia; Seizure Disorder Oncologic Medical History: Negative for: Received Chemotherapy HBO Extended History Items Eyes: Cataracts Immunizations Pneumococcal Vaccine: Received Pneumococcal Vaccination: Yes Implantable Devices None Family and Social History Cancer: Yes - Father,Mother,Siblings; Diabetes: No; Heart Disease: No; Hereditary Spherocytosis: No; Hypertension: No; Kidney Disease: No; Lung Disease: No; Seizures: No; Stroke: No; Thyroid Problems: No; Tuberculosis: No; Never smoker; Marital Status - Widowed; Alcohol Use: Never; Drug Use: No History; Caffeine Use: Daily - coffee; Financial Concerns: No; Food, Clothing or Shelter Needs: No; Support System Lacking: No; Transportation Concerns: No Physician Affirmation I have reviewed and agree with the above information. Electronic Signature(s) AZIANA, DALL ([id number]) Signed: 02/27/2019 5:01:07 PM By: Army Melia Signed: 02/27/2019  5:50:00 PM By: Worthy Keeler PA-C Entered By: Worthy Keeler on 02/27/2019 13:53:55 Giron, Jackie Plum ([id number]) -------------------------------------------------------------------------------- SuperBill Details Patient Name: Sandra Pair B. Date of Service: 02/27/2019 Medical Record Number: [id number] Patient Account Number: [id number] Date of Birth/Sex: 1920-07-25 (85 y.o. F) Treating RN: Army Melia Primary Care Provider: [name] Other Clinician: Referring Provider: [name] Treating Provider/Extender: Melburn Hake, Nysha Koplin Weeks in Treatment: 11 Diagnosis Coding ICD-10 Codes Code Description I87.2 Venous insufficiency (chronic) (peripheral) L97.822 Non-pressure chronic ulcer of other part of left lower leg with fat layer exposed Facility Procedures CPT4 Code: [id number] Description: [facility] VISIT-LEV 3 EST PT Modifier: Quantity: 1 Physician Procedures CPT4 Code Description: [id number] - WC PHYS LEVEL 3 - EST PT ICD-10 Diagnosis Description I87.2 Venous insufficiency (chronic) (peripheral) L97.822 Non-pressure chronic ulcer of other part of left lower leg wit Modifier: h fat layer expos Quantity: 1 ed Electronic Signature(s) Signed: 02/27/2019 1:57:30 PM By: Worthy Keeler PA-C Entered By: Worthy Keeler on 02/27/2019 13:57:29

## 2019-03-06 ENCOUNTER — Ambulatory Visit: Payer: Medicare Other | Admitting: Physician Assistant

## 2019-03-14 ENCOUNTER — Emergency Department
Admission: EM | Admit: 2019-03-14 | Discharge: 2019-03-14 | Disposition: A | Discharge status: Home / Self Care | Payer: Medicare Other | Attending: Emergency Medicine | Admitting: Emergency Medicine

## 2019-03-14 ENCOUNTER — Encounter: Payer: Self-pay | Admitting: Emergency Medicine

## 2019-03-14 ENCOUNTER — Other Ambulatory Visit: Payer: Self-pay

## 2019-03-14 ENCOUNTER — Emergency Department: Payer: Medicare Other

## 2019-03-14 DIAGNOSIS — Y9389 Activity, other specified: Secondary | ICD-10-CM | POA: Insufficient documentation

## 2019-03-14 DIAGNOSIS — Y92018 Other place in single-family (private) house as the place of occurrence of the external cause: Secondary | ICD-10-CM | POA: Diagnosis not present

## 2019-03-14 DIAGNOSIS — W19XXXA Unspecified fall, initial encounter: Secondary | ICD-10-CM

## 2019-03-14 DIAGNOSIS — I10 Essential (primary) hypertension: Secondary | ICD-10-CM | POA: Diagnosis not present

## 2019-03-14 DIAGNOSIS — S52571A Other intraarticular fracture of lower end of right radius, initial encounter for closed fracture: Secondary | ICD-10-CM

## 2019-03-14 DIAGNOSIS — Y92009 Unspecified place in unspecified non-institutional (private) residence as the place of occurrence of the external cause: Secondary | ICD-10-CM

## 2019-03-14 DIAGNOSIS — Y999 Unspecified external cause status: Secondary | ICD-10-CM | POA: Insufficient documentation

## 2019-03-14 DIAGNOSIS — Z79899 Other long term (current) drug therapy: Secondary | ICD-10-CM | POA: Insufficient documentation

## 2019-03-14 DIAGNOSIS — S6991XA Unspecified injury of right wrist, hand and finger(s), initial encounter: Secondary | ICD-10-CM | POA: Diagnosis present

## 2019-03-14 DIAGNOSIS — Z853 Personal history of malignant neoplasm of breast: Secondary | ICD-10-CM | POA: Insufficient documentation

## 2019-03-14 DIAGNOSIS — W010XXA Fall on same level from slipping, tripping and stumbling without subsequent striking against object, initial encounter: Secondary | ICD-10-CM | POA: Diagnosis not present

## 2019-03-14 MED ORDER — ACETAMINOPHEN 500 MG PO TABS
1000.0000 mg | ORAL_TABLET | Freq: Once | ORAL | Status: AC
  Administered 2019-03-14: 1000 mg via ORAL
  Filled 2019-03-14: qty 2

## 2019-03-14 MED ORDER — HYDROCODONE-ACETAMINOPHEN 5-325 MG PO TABS: 1.0000 | ORAL_TABLET | ORAL | 0 refills | Status: DC | PRN

## 2019-03-14 NOTE — ED Triage Notes (Signed)
Pt arrives POV to triage with c/o fall. Pt states that she was closing her blinds and fell on the carpet onto her right wrist. Pt denies LOC or head trauma. Pt is A/O x 4 but hard of hearing. Pt has swelling noted to wright wrist at this time.

## 2019-03-14 NOTE — ED Notes (Signed)
First nurse note: pt assisted to chair, son states pt fell and injured hand. Pt alert and oriented x4. Walks with cane.

## 2019-03-14 NOTE — ED Provider Notes (Signed)
[facility] Emergency Department Provider Note  ____________________________________________  Time seen: Approximately 9:56 PM  I have reviewed the triage vital signs and the nursing notes.   HISTORY  Chief Complaint Fall    HPI [name] is a 85 y.o. female who presents the emergency department with her son for complaint of right wrist injury.  Patient is 85 years old but is very independent, lives by herself at home.  Patient was closing the blinds and her windows after having them open today when she tripped and fell onto her wrist.  Patient remembers the entire event, did not hit her head or lose consciousness.  Patient son was present and states that only concern at this time is right wrist injury.  Patient has pain, edema and ecchymosis to the wrist.  Patient has a history of neuropathy to the right wrist and hand but has no history of musculoskeletal injury.  No medications prior to arrival.  Only complaint at this time is wrist pain.         Past Medical History:  Diagnosis Date  . Allergy   . Arthritis    back  . Benign neoplasm of skin, site unspecified   . Bowel trouble   . Breast cancer ([place])   . Cancer ([place])    colon 20 years ago  . Cancer of breast [facility]) April 10,.2013   Left breast, 3.8 cm, 4 cm axillary node, T2, N1a, ER negative PR negative, HER-2/neu not amplified.  . Cataract   . Colon cancer ([place])   . Hard of hearing   . Heartburn   . Hiatal hernia   . History of stomach ulcers   . History of stomach ulcers   . Hypertension 2012  . Personal history of malignant neoplasm of breast 2013   LEFT MASTECTOMY,left modified radical mastectomy on September 06, 2011 483.8 cm primary tumor with a 4.6 cm axillary metastasis. 3 of 13 nodes were positive. T2,N1a lesion. This was a triple negative lesion  . Shingles Dec 2015  . Vaginal atrophy 08/10/2015    Patient Active Problem List   Diagnosis Date Noted  . Open wound of left knee, leg,  and ankle with complication 17/00/1749  . Chest wall pain 10/08/2017  . History of recurrent urinary tract infection 02/03/2017  . Vaginal pessary in situ 02/03/2017  . Iron deficiency anemia due to chronic blood loss 12/01/2016  . Personal history of malignant neoplasm of breast 11/09/2016  . GI bleed 08/21/2016  . Hereditary and idiopathic peripheral neuropathy 09/09/2015  . Malignant neoplasm of left female breast ([place]) 09/09/2015  . Prolapse of vaginal wall with midline cystocele 08/10/2015  . Incomplete bladder emptying 08/10/2015  . Midline low back pain without sciatica 08/10/2015  . Fecal incontinence 08/10/2015  . Recurrent UTI 07/19/2015  . Atrophic vaginitis 07/19/2015  . Adenomatous colon polyp 02/24/2015  . Anemia 02/24/2015  . Chronic sinusitis 02/24/2015  . Diverticulosis 02/24/2015  . Esophagitis 02/24/2015  . GERD (gastroesophageal reflux disease) 02/24/2015  . Hiatal hernia 02/24/2015  . Breast cancer, left breast ([place]) 09/05/2012  . Hypertension     Past Surgical History:  Procedure Laterality Date  . ABDOMINAL HYSTERECTOMY     42 YEARS AGO  . BREAST SURGERY Left 09-06-11   left modified radical mastectomy on September 06, 2011 483.8 cm primary tumor with a 4.6 cm axillary metastasis. 3 of 13 nodes were positive. T2,N1a lesion. This was a triple negative lesion  . CARPAL TUNNEL RELEASE  Right September 25, 2014   Skip Estimable, M.D.  . COLON RESECTION  1985  . COLON SURGERY  20 YEARS AGO  . COLONOSCOPY  2012   DR. ELLIOTT  . COLONOSCOPY WITH PROPOFOL N/A 08/25/2016   Procedure: COLONOSCOPY WITH PROPOFOL;  Surgeon: [name], MD;  Location: [facility] ENDOSCOPY;  Service: Gastroenterology;  Laterality: N/A;  . ESOPHAGOGASTRODUODENOSCOPY (EGD) WITH PROPOFOL N/A 08/23/2016   Procedure: ESOPHAGOGASTRODUODENOSCOPY (EGD) WITH PROPOFOL;  Surgeon: [name], MD;  Location: [facility] ENDOSCOPY;  Service: Endoscopy;  Laterality: N/A;  . MASTECTOMY Left 2013   left modified radical  mastectomy on September 06, 2011 Stage 2; 3.8 cm primary tumor with a 4.6 cm axillary metastasis. 3 of 13 nodes were positive. T2,N1a lesion. This was a triple negative lesion  . UPPER GI ENDOSCOPY  2012   BLEEDING    Prior to Admission medications   Medication Sig Start Date End Date Taking? Authorizing Provider  acetaminophen (TYLENOL) 650 MG CR tablet Take by mouth.    [provider]  Ascorbic Acid (VITAMIN C) 1000 MG tablet Take 1,000 mg by mouth daily.    [provider]  Cholecalciferol (VITAMIN D) 50 MCG (2000 UT) CAPS Take 1 capsule by mouth daily.    [provider]  citalopram (CELEXA) 10 MG tablet Take 10 mg by mouth daily.    [provider]  colestipol (COLESTID) 1 g tablet Take 1 tablet by mouth 2 (two) times daily. 05/15/16   [provider]  Cranberry 500 MG CAPS Take by mouth daily.    [provider]  estradiol (ESTRACE VAGINAL) 0.1 MG/GM vaginal cream Apply 0.'5mg'$  (pea-sized amount)  just inside the vaginal introitus with a finger-tip every night for two weeks and then Monday, Wednesday and Friday nights. 07/19/15   Zara Council A, PA-C  gabapentin (NEURONTIN) 100 MG capsule Take on tablet each night for two weeks. Increase to two tablets nightly if no improvement. Patient taking differently: Take 100 mg by mouth 3 (three) times daily. Take on tablet each night for two weeks. Increase to 3 tablets nightly. 09/09/15   Robert Bellow, MD  HYDROcodone-acetaminophen (NORCO/VICODIN) 5-325 MG tablet Take 1 tablet by mouth every 4 (four) hours as needed for moderate pain. 03/14/19   Ethaniel Garfield, Charline Bills, PA-C  Lactobacillus Rhamnosus, GG, (PROBIOTIC COLIC PO) Take 1 Dose by mouth daily. doterra brands    [provider]  loperamide (IMODIUM A-D) 2 MG tablet Take 2 mg by mouth as needed for diarrhea or loose stools.    [provider]  Melatonin 5 MG CAPS Take 1 capsule by mouth.     [provider]   metoprolol succinate (TOPROL-XL) 25 MG 24 hr tablet Take 25 mg by mouth daily.    [provider]  nitrofurantoin, macrocrystal-monohydrate, (MACROBID) 100 MG capsule Take 1 capsule (100 mg total) by mouth 2 (two) times daily. 02/26/19   Rubie Maid, MD  OXYQUINOLONE SULFATE VAGINAL 0.025 % GEL Place 1 Applicatorful vaginally 2 (two) times a week. 09/09/15   Rubie Maid, MD  pantoprazole (PROTONIX) 40 MG tablet TAKE 1 TABLET BY MOUTH ONCE DAILY 02/22/18   [name], MD  Potassium 99 MG TABS Take 1 tablet by mouth daily.    [provider]  vitamin B-12 (CYANOCOBALAMIN) 1000 MCG tablet Take 1,000 mcg by mouth daily.    [provider]    Allergies Codeine  Family History  Problem Relation Age of Onset  . Lung cancer Brother  colono ca  . Cancer Brother   . Skin cancer Daughter   . Leukemia Father   . Cancer Father   . Colon cancer Mother   . Cancer Mother   . Skin cancer Son   . Kidney disease Neg Hx   . Bladder Cancer Neg Hx     Social History Social History   Tobacco Use  . Smoking status: Never Smoker  . Smokeless tobacco: Never Used  Substance Use Topics  . Alcohol use: No  . Drug use: No     Review of Systems  Constitutional: No fever/chills Eyes: No visual changes. No discharge ENT: No upper respiratory complaints. Cardiovascular: no chest pain. Respiratory: no cough. No SOB. Gastrointestinal: No abdominal pain.  No nausea, no vomiting.  No diarrhea.  No constipation. Genitourinary: Negative for dysuria. No hematuria Musculoskeletal: Positive for right wrist pain/injury Skin: Negative for rash, abrasions, lacerations, ecchymosis. Neurological: Negative for headaches, focal weakness or numbness. 10-point ROS otherwise negative.  ____________________________________________   PHYSICAL EXAM:  VITAL SIGNS: ED Triage Vitals  Enc Vitals Group     BP 03/14/19 2016 (!) 161/71     Pulse Rate 03/14/19 2016 73     Resp  03/14/19 2016 17     Temp 03/14/19 2016 98.5 F (36.9 C)     Temp Source 03/14/19 2016 Oral     SpO2 03/14/19 2016 95 %     Weight 03/14/19 2017 150 lb (68 kg)     Height 03/14/19 2017 '5\' 6"'$  (1.676 m)     Head Circumference --      Peak Flow --      Pain Score 03/14/19 2016 6     Pain Loc --      Pain Edu? --      Excl. in [place]? --      Constitutional: Alert and oriented. Well appearing and in no acute distress. Eyes: Conjunctivae are normal. PERRL. EOMI. Head: Atraumatic. Neck: No stridor.  No cervical spine tenderness to palpation.  Cardiovascular: Normal rate, regular rhythm. Normal S1 and S2.  Good peripheral circulation. Respiratory: Normal respiratory effort without tachypnea or retractions. Lungs CTAB. Good air entry to the bases with no decreased or absent breath sounds. Musculoskeletal: Full range of motion to all extremities. No gross deformities appreciated.  Visualization of the right wrist and forearm reveals edema and ecchymosis about the distal wrist.  No gross deformity.  Patient is still moving her wrist at this time.  Patient endorses tenderness over palpation over the distal radius.  Palpation reveals palpable abnormality in this area.  Examination of the hand and elbow is unremarkable.  Radial pulse and capillary refill intact all digits.  Sensation intact all 5 digits. Neurologic:  Normal speech and language. No gross focal neurologic deficits are appreciated.  Skin:  Skin is warm, dry and intact. No rash noted. Psychiatric: Mood and affect are normal. Speech and behavior are normal. Patient exhibits appropriate insight and judgement.   ____________________________________________   LABS (all labs ordered are listed, but only abnormal results are displayed)  Labs Reviewed - No data to display ____________________________________________  EKG   ____________________________________________  RADIOLOGY I personally viewed and evaluated these images as part of  my medical decision making, as well as reviewing the written report by the radiologist.  Dg Wrist Complete Right  Result Date: 03/14/2019 CLINICAL DATA:  Fall EXAM: RIGHT WRIST - COMPLETE 3+ VIEW COMPARISON:  None. FINDINGS: The osseous structures appear diffusely demineralized which may limit detection  of small or nondisplaced fractures. There is a mildly comminuted, impacted fracture of the distal radial metaphysis with extension into the distal radioulnar joint and likely extension to the radiocarpal joint as well. A minimally displaced fracture of the ulnar styloid process is noted as well. Carpal arcs are maintained. Questionable lucency through the scaphoid without clear cortical disruption. May reflect posttraumatic versus degenerative change. Circumferential swelling of the wrist. IMPRESSION: 1. Mildly comminuted, impacted fracture of the distal radial metaphysis with extension into the distal radioulnar joint and likely extension to the radiocarpal joint. 2. Minimally displaced fracture of the ulnar styloid process. 3. Questionable lucency through the scaphoid without clear cortical disruption. Could reflect an acute osseous injury or degenerative change. Electronically Signed   By: Lovena Le M.D.   On: 03/14/2019 20:41    ____________________________________________    PROCEDURES  Procedure(s) performed:    .Splint Application  Date/Time: 03/14/2019 10:20 PM Performed by: Darletta Moll, PA-C Authorized by: Darletta Moll, PA-C   Consent:    Consent obtained:  Verbal   Consent given by:  Patient   Risks discussed:  Pain and swelling Pre-procedure details:    Sensation:  Normal Procedure details:    Laterality:  Right   Location:  Wrist   Wrist:  R wrist   Splint type:  Volar short arm   Supplies:  Cotton padding, Ortho-Glass and elastic bandage Post-procedure details:    Pain:  Improved   Sensation:  Normal   Patient tolerance of procedure:  Tolerated  well, no immediate complications      Medications  acetaminophen (TYLENOL) tablet 1,000 mg (has no administration in time range)     ____________________________________________   INITIAL IMPRESSION / ASSESSMENT AND PLAN / ED COURSE  Pertinent labs & imaging results that were available during my care of the patient were reviewed by me and considered in my medical decision making (see chart for details).  Review of the [place] CSRS was performed in accordance of the [place] prior to dispensing any controlled drugs.           Patient's diagnosis is consistent with fall at home resulting in a wrist fracture.  Patient presented to the emergency department after a mechanical fall from home.  Patient's only complaint was right wrist injury.  Patient did not hit her head or lose consciousness.  Exam with the exception of the wrist is unremarkable.  Visualization of the wrist was concerning for fracture which is indicated on x-ray.  Patient has impacted comminuted fracture of the distal radius.  There does appear to be intra-articular involvement.  There is also questionable lucency through the scaphoid as well.  At this time, patient will be placed in a splint, referred to orthopedics for further management.  Patient is neurovascularly intact at this time.  No attempts at reduction is performed to emergency department.  Patient will be prescribed Vicodin for pain, however with the discussion of patient's age, use of narcotics, the son advises that he will keep the prescription and only administer when somebody is with the patient and the patient is in acute pain.  Patient is relatively comfortable at this time and received Tylenol for pain control in the emergency department.  Again, follow-up with orthopedics..  Patient is given ED precautions to return to the ED for any worsening or new symptoms.     ____________________________________________  FINAL CLINICAL IMPRESSION(S) / ED DIAGNOSES  Final  diagnoses:  Fall in home, initial encounter  Other closed intra-articular  fracture of distal end of right radius, initial encounter      NEW MEDICATIONS STARTED DURING THIS VISIT:  ED Discharge Orders         Ordered    HYDROcodone-acetaminophen (NORCO/VICODIN) 5-325 MG tablet  Every 4 hours PRN     03/14/19 2223              This chart was dictated using voice recognition software/Dragon. Despite best efforts to proofread, errors can occur which can change the meaning. Any change was purely unintentional.    Darletta Moll, PA-C 03/14/19 2224    Nance Pear, MD 03/14/19 2252

## 2019-03-14 NOTE — ED Notes (Signed)
Pt updated on wait time. Pt verbalizes understanding.

## 2019-03-14 NOTE — ED Notes (Signed)
Pt's son [name] [phone].

## 2019-03-14 NOTE — ED Notes (Signed)
Pt arrives via waiting room via wheelchair, A&Ox4, NAD. Swelling noted to right wrist

## 2019-03-21 ENCOUNTER — Telehealth: Payer: Self-pay | Admitting: Obstetrics and Gynecology

## 2019-03-21 NOTE — Telephone Encounter (Signed)
Spoke with pt's son and ex wife. Given directions to ex-wife on how to insert the pessary. Ex-wife stated that she was nurse and could reinsert the pessary. Advised them(pt's son and ex wife) to call the office back if they were unable to insert the pessary.

## 2019-03-21 NOTE — Telephone Encounter (Signed)
The patients son called and stated that the patient's pessary has fallen out and is wanting to come into the office to have the pessary placed again. Pt's son is requesting a call back. Please advise.

## 2019-03-24 NOTE — Telephone Encounter (Signed)
Pt called to see if her pessary was doing okay and if it was in place. Was informed that the pessary is in place without any problems.

## 2019-03-31 ENCOUNTER — Ambulatory Visit (INDEPENDENT_AMBULATORY_CARE_PROVIDER_SITE_OTHER): Payer: Medicare Other | Admitting: Gastroenterology

## 2019-03-31 ENCOUNTER — Other Ambulatory Visit: Payer: Self-pay

## 2019-03-31 VITALS — BP 150/69 | HR 75 | Temp 97.8°F | Ht 64.0 in | Wt 154.0 lb

## 2019-03-31 DIAGNOSIS — R197 Diarrhea, unspecified: Secondary | ICD-10-CM | POA: Diagnosis not present

## 2019-03-31 MED ORDER — CHOLESTYRAMINE 4 G PO PACK: 4.0000 g | PACK | Freq: Every day | ORAL | 0 refills | Status: DC

## 2019-03-31 NOTE — Addendum Note (Signed)
Addended by: Dorethea Clan on: 03/31/2019 02:52 PM   Modules accepted: Orders

## 2019-03-31 NOTE — Progress Notes (Signed)
[name] MD, MRCP(U.K) [address]  [employer]  [place], [place] [address]  Main: [phone]  Fax: [phone]   Primary Care Physician: [name], MD  Primary Gastroenterologist:  Dr. [name]   Diarrhea  HPI: [name] is a 85 y.o. female    Summary of history :  She was last seen in my office back in February 2019.  Admitted in March 2018 for a GI bleed while on NSAIDs.  EGD found mild esophagitis and large hiatal hernia.  Colonoscopy was normal except for a small polyp.  Had been on IV iron for iron deficiency anemia.  She called her office and June 2020 with diarrhea and has stool for C. difficile was negative.  Has been positive for adenovirus and Campylobacter.  Treated with azithromycin.  Interval history   07/18/2017-11/28/2018 02/11/2019: Hemoglobin 14.5 g with normal iron studies.  She received a course of Macrobid for E. coli UTI in 02/20/2019  She states that the diarrhea resolved in June 2020 but subsequently has recurred has had multiple bowel movements a day and has actually soiled her clothes as well.  Denies any abdominal pain.  Current Outpatient Medications  Medication Sig Dispense Refill  . acetaminophen (TYLENOL) 650 MG CR tablet Take by mouth.    . Ascorbic Acid (VITAMIN C) 1000 MG tablet Take 1,000 mg by mouth daily.    . Cholecalciferol (VITAMIN D) 50 MCG (2000 UT) CAPS Take 1 capsule by mouth daily.    . citalopram (CELEXA) 10 MG tablet Take 10 mg by mouth daily.    . colestipol (COLESTID) 1 g tablet Take 1 tablet by mouth 2 (two) times daily.    . Cranberry 500 MG CAPS Take by mouth daily.    Marland Kitchen estradiol (ESTRACE VAGINAL) 0.1 MG/GM vaginal cream Apply 0.5mg  (pea-sized amount)  just inside the vaginal introitus with a finger-tip every night for two weeks and then Monday, Wednesday and Friday nights. 30 g 12  . gabapentin (NEURONTIN) 100 MG capsule Take on tablet each night for two weeks. Increase to two tablets nightly if no  improvement. (Patient taking differently: Take 100 mg by mouth 3 (three) times daily. Take on tablet each night for two weeks. Increase to 3 tablets nightly.) 90 capsule 6  . HYDROcodone-acetaminophen (NORCO/VICODIN) 5-325 MG tablet Take 1 tablet by mouth every 4 (four) hours as needed for moderate pain. (Patient not taking: Reported on 03/31/2019) 15 tablet 0  . Lactobacillus Rhamnosus, GG, (PROBIOTIC COLIC PO) Take 1 Dose by mouth daily. doterra brands    . loperamide (IMODIUM A-D) 2 MG tablet Take 2 mg by mouth as needed for diarrhea or loose stools.    . Melatonin 5 MG CAPS Take 1 capsule by mouth.     . metoprolol succinate (TOPROL-XL) 25 MG 24 hr tablet Take 25 mg by mouth daily.    . nitrofurantoin, macrocrystal-monohydrate, (MACROBID) 100 MG capsule Take 1 capsule (100 mg total) by mouth 2 (two) times daily. (Patient not taking: Reported on 03/31/2019) 14 capsule 0  . OXYQUINOLONE SULFATE VAGINAL 0.025 % GEL Place 1 Applicatorful vaginally 2 (two) times a week. 1 Tube 3  . pantoprazole (PROTONIX) 40 MG tablet TAKE 1 TABLET BY MOUTH ONCE DAILY 90 tablet 2  . Potassium 99 MG TABS Take 1 tablet by mouth daily.    . vitamin B-12 (CYANOCOBALAMIN) 1000 MCG tablet Take 1,000 mcg by mouth daily.     No current facility-administered medications for this visit.  Allergies as of 03/31/2019 - Review Complete 03/31/2019  Allergen Reaction Noted  . Codeine Nausea And Vomiting 07/27/2012    ROS:  General: Negative for anorexia, weight loss, fever, chills, fatigue, weakness. ENT: Negative for hoarseness, difficulty swallowing , nasal congestion. CV: Negative for chest pain, angina, palpitations, dyspnea on exertion, peripheral edema.  Respiratory: Negative for dyspnea at rest, dyspnea on exertion, cough, sputum, wheezing.  GI: See history of present illness. GU:  Negative for dysuria, hematuria, urinary incontinence, urinary frequency, nocturnal urination.  Endo: Negative for unusual weight  change.    Physical Examination:   BP (!) 150/69   Pulse 75   Temp 97.8 F (36.6 C)   Ht 5\' 4"  (1.626 m)   Wt 154 lb (69.9 kg)   BMI 26.43 kg/m  In a wheelchair appears comfortable General: Well-nourished, well-developed in no acute distress.  Eyes: No icterus. Conjunctivae pink. Psych: Alert and cooperative, normal mood and affect.   Imaging Studies: Dg Wrist Complete Right  Result Date: 03/14/2019 CLINICAL DATA:  Fall EXAM: RIGHT WRIST - COMPLETE 3+ VIEW COMPARISON:  None. FINDINGS: The osseous structures appear diffusely demineralized which may limit detection of small or nondisplaced fractures. There is a mildly comminuted, impacted fracture of the distal radial metaphysis with extension into the distal radioulnar joint and likely extension to the radiocarpal joint as well. A minimally displaced fracture of the ulnar styloid process is noted as well. Carpal arcs are maintained. Questionable lucency through the scaphoid without clear cortical disruption. May reflect posttraumatic versus degenerative change. Circumferential swelling of the wrist. IMPRESSION: 1. Mildly comminuted, impacted fracture of the distal radial metaphysis with extension into the distal radioulnar joint and likely extension to the radiocarpal joint. 2. Minimally displaced fracture of the ulnar styloid process. 3. Questionable lucency through the scaphoid without clear cortical disruption. Could reflect an acute osseous injury or degenerative change. Electronically Signed   By: Lovena Le M.D.   On: 03/14/2019 20:41    Assessment and Plan:   [name] is a 85 y.o. y/o female history of iron deficiency anemia on IV iron with normalization of her counts.  Prior history of Campylobacter and adenovirus diarrhea in June 2020 treated successfully at that point of time.  She presents to the office for recurrence of diarrhea.  Recent utilization of antibiotics for E. coli UTI.  Plan 1.  Stool for C. difficile and  GI PCR 2.  Keep adequately hydrated avoid sugary drinks which can cause dehydration.  Avoid any artificial sugars.    Dr [name]  MD,MRCP [facility]) Follow up in 2 to 3 days telephone visit

## 2019-04-03 ENCOUNTER — Telehealth: Payer: Self-pay | Admitting: Gastroenterology

## 2019-04-03 ENCOUNTER — Ambulatory Visit: Payer: Medicare Other | Admitting: Gastroenterology

## 2019-04-03 LAB — GI PROFILE, STOOL, PCR

## 2019-04-03 LAB — CLOSTRIDIUM DIFFICILE EIA: C difficile Toxins A+B, EIA: NEGATIVE

## 2019-04-03 NOTE — Telephone Encounter (Signed)
Pt daughter would like a call from Dr. Vicente Males 's nurse regarding canceling the televisit for today

## 2019-04-04 NOTE — Progress Notes (Signed)
[name] or [name] inform stool tests are negative- ask if still having diarrhea if yes tele visit on Monday

## 2019-04-04 NOTE — Telephone Encounter (Signed)
Spoke with pt daughter and informed her of pt lab results and Dr. Georgeann Oppenheim suggestion. Pt daughter states pt is still experiencing diarrhea but not more than usual. Pt already has a follow up visit scheduled next week.

## 2019-04-04 NOTE — Telephone Encounter (Signed)
-----   Message from [name], MD sent at 04/04/2019  8:06 AM EST ----- Laurence Aly inform stool tests are negative- ask if still having diarrhea if yes tele visit on Monday

## 2019-04-09 ENCOUNTER — Ambulatory Visit (INDEPENDENT_AMBULATORY_CARE_PROVIDER_SITE_OTHER): Payer: Medicare Other | Admitting: Gastroenterology

## 2019-04-09 DIAGNOSIS — R197 Diarrhea, unspecified: Secondary | ICD-10-CM

## 2019-04-09 NOTE — Progress Notes (Signed)
[name] , MD [address]  [place]  [place], [place] [address]  Main: [phone]  Fax: [phone]   Primary Care Physician: [name], MD  Virtual Visit via Telephone Note  I connected with patient on 04/09/19 at 11:15 AM EST by telephone and verified that I am speaking with the correct person using two identifiers.   I discussed the limitations, risks, security and privacy concerns of performing an evaluation and management service by telephone and the availability of in person appointments. I also discussed with the patient that there may be a patient responsible charge related to this service. The patient expressed understanding and agreed to proceed.  Location of Patient: Home Location of Provider: Home Persons involved: Patient and provider only   History of Present Illness:   Follow-up for diarrhea  HPI: [name] is a 85 y.o. female   Summary of history :  She was last seen in my office back in February 2019.  Admitted in March 2018 for a GI bleed while on NSAIDs.  EGD found mild esophagitis and large hiatal hernia.  Colonoscopy was normal except for a small polyp.  Had been on IV iron for iron deficiency anemia.  She called her office and June 2020 with diarrhea and has stool for C. difficile was negative.  Has been positive for adenovirus and Campylobacter.  Treated with azithromycin. 02/11/2019: Hemoglobin 14.5 g with normal iron studies. She received a course of Macrobid for E. coli UTI in 02/20/2019  Interval history   03/31/2019-04/09/2019  04/01/2019: GI PCR and C. difficile tests on her stools were negative  She was started on Questran and since then she has been having 1 bowel movement every other day.  Her daughter said that it has helped her immensely.  She says that she has had diarrhea for many years.    Current Outpatient Medications  Medication Sig Dispense Refill   acetaminophen (TYLENOL) 650 MG CR tablet Take by  mouth.     Ascorbic Acid (VITAMIN C) 1000 MG tablet Take 1,000 mg by mouth daily.     Cholecalciferol (VITAMIN D) 50 MCG (2000 UT) CAPS Take 1 capsule by mouth daily.     cholestyramine (QUESTRAN) 4 g packet Take 1 packet (4 g total) by mouth daily. 90 packet 0   citalopram (CELEXA) 10 MG tablet Take 10 mg by mouth daily.     colestipol (COLESTID) 1 g tablet Take 1 tablet by mouth 2 (two) times daily.     Cranberry 500 MG CAPS Take by mouth daily.     estradiol (ESTRACE VAGINAL) 0.1 MG/GM vaginal cream Apply 0.5mg  (pea-sized amount)  just inside the vaginal introitus with a finger-tip every night for two weeks and then Monday, Wednesday and Friday nights. 30 g 12   gabapentin (NEURONTIN) 100 MG capsule Take on tablet each night for two weeks. Increase to two tablets nightly if no improvement. (Patient taking differently: Take 100 mg by mouth 3 (three) times daily. Take on tablet each night for two weeks. Increase to 3 tablets nightly.) 90 capsule 6   HYDROcodone-acetaminophen (NORCO/VICODIN) 5-325 MG tablet Take 1 tablet by mouth every 4 (four) hours as needed for moderate pain. (Patient not taking: Reported on 03/31/2019) 15 tablet 0   Lactobacillus Rhamnosus, GG, (PROBIOTIC COLIC PO) Take 1 Dose by mouth daily. doterra brands     loperamide (IMODIUM A-D) 2 MG tablet Take 2 mg by mouth as needed for diarrhea or loose stools.  Melatonin 5 MG CAPS Take 1 capsule by mouth.      metoprolol succinate (TOPROL-XL) 25 MG 24 hr tablet Take 25 mg by mouth daily.     nitrofurantoin, macrocrystal-monohydrate, (MACROBID) 100 MG capsule Take 1 capsule (100 mg total) by mouth 2 (two) times daily. (Patient not taking: Reported on 03/31/2019) 14 capsule 0   OXYQUINOLONE SULFATE VAGINAL 0.025 % GEL Place 1 Applicatorful vaginally 2 (two) times a week. 1 Tube 3   pantoprazole (PROTONIX) 40 MG tablet TAKE 1 TABLET BY MOUTH ONCE DAILY 90 tablet 2   Potassium 99 MG TABS Take 1 tablet by mouth daily.       vitamin B-12 (CYANOCOBALAMIN) 1000 MCG tablet Take 1,000 mcg by mouth daily.     No current facility-administered medications for this visit.     Allergies as of 04/09/2019 - Review Complete 03/31/2019  Allergen Reaction Noted   Codeine Nausea And Vomiting 07/27/2012    Review of Systems:    All systems reviewed and negative except where noted in HPI.   Observations/Objective:  Labs: CMP     Component Value Date/Time   NA 138 11/27/2016 0526   NA 138 01/06/2012 2039   K 3.7 11/27/2016 0526   K 3.5 01/06/2012 2039   CL 107 11/27/2016 0526   CL 105 01/06/2012 2039   CO2 25 11/27/2016 0526   CO2 24 01/06/2012 2039   GLUCOSE 95 11/27/2016 0526   GLUCOSE 213 (H) 01/06/2012 2039   BUN 9 11/27/2016 0526   BUN 11 01/06/2012 2039   CREATININE 0.63 11/27/2016 0526   CREATININE 0.66 01/06/2012 2039   CALCIUM 8.0 (L) 11/27/2016 0526   CALCIUM 8.7 01/06/2012 2039   PROT 7.6 01/06/2012 2039   ALBUMIN 3.9 01/06/2012 2039   AST 23 01/06/2012 2039   ALT 21 01/06/2012 2039   ALKPHOS 88 01/06/2012 2039   BILITOT 0.6 01/06/2012 2039   GFRNONAA >60 11/27/2016 0526   GFRNONAA >60 01/06/2012 2039   GFRAA >60 11/27/2016 0526   GFRAA >60 01/06/2012 2039   Lab Results  Component Value Date   WBC 8.2 02/11/2019   HGB 14.5 02/11/2019   HCT 44.4 02/11/2019   MCV 96.9 02/11/2019   PLT 301 02/11/2019    Imaging Studies: Dg Wrist Complete Right  Result Date: 03/14/2019 CLINICAL DATA:  Fall EXAM: RIGHT WRIST - COMPLETE 3+ VIEW COMPARISON:  None. FINDINGS: The osseous structures appear diffusely demineralized which may limit detection of small or nondisplaced fractures. There is a mildly comminuted, impacted fracture of the distal radial metaphysis with extension into the distal radioulnar joint and likely extension to the radiocarpal joint as well. A minimally displaced fracture of the ulnar styloid process is noted as well. Carpal arcs are maintained. Questionable lucency through the  scaphoid without clear cortical disruption. May reflect posttraumatic versus degenerative change. Circumferential swelling of the wrist. IMPRESSION: 1. Mildly comminuted, impacted fracture of the distal radial metaphysis with extension into the distal radioulnar joint and likely extension to the radiocarpal joint. 2. Minimally displaced fracture of the ulnar styloid process. 3. Questionable lucency through the scaphoid without clear cortical disruption. Could reflect an acute osseous injury or degenerative change. Electronically Signed   By: Lovena Le M.D.   On: 03/14/2019 20:41    Assessment and Plan:   [name] is a 85 y.o. y/o female with history of iron deficiency anemia on IV iron with normalization of her counts.  Prior history of Campylobacter and adenovirus diarrhea in June  2020 treated successfully at that point of time.  She presents to the office for recurrence of diarrhea on 03/31/2019 and here for follow-up today.  Stool studies were negative for infection.  Since commencing on cholestyramine 1 packet a day she has been having 1 bowel movement every other day.  I discussed with her daughter and said she can probably cut it to half a packet a day.  And call us as needed if the issue recurs.  Daughter said that the issue has been longstanding for many years and this has helped her significantly.   I discussed the assessment and treatment plan with the patient. The patient was provided an opportunity to ask questions and all were answered. The patient agreed with the plan and demonstrated an understanding of the instructions.   The patient was advised to call back or seek an in-person evaluation if the symptoms worsen or if the condition fails to improve as anticipated.  I provided 11 minutes of non-face-to-face time during this encounter.  Dr [name] MD,MRCP [facility]) Gastroenterology/Hepatology Pager: [phone]   Speech recognition software was used to dictate this note.

## 2019-04-14 ENCOUNTER — Telehealth: Payer: Self-pay

## 2019-04-14 NOTE — Telephone Encounter (Signed)
Pt's daughter called no answer LM via VM that [facility] had an opening on May 02, 2019 at 3:30pm to have the pessary reinserted and that she come by the office and pick up a urine hat and cup for urine sample and drop off.

## 2019-04-14 NOTE — Telephone Encounter (Signed)
Pessary came out this weekend. Symptoms uti. Low back pain. Jon Billings daughter [phone]. Please sched appt and put pessary back in and test for UTI per daughter request.

## 2019-04-15 ENCOUNTER — Other Ambulatory Visit (INDEPENDENT_AMBULATORY_CARE_PROVIDER_SITE_OTHER): Payer: Medicare Other | Admitting: Obstetrics and Gynecology

## 2019-04-15 ENCOUNTER — Other Ambulatory Visit: Payer: Self-pay

## 2019-04-15 DIAGNOSIS — R399 Unspecified symptoms and signs involving the genitourinary system: Secondary | ICD-10-CM | POA: Diagnosis not present

## 2019-04-15 LAB — POCT URINALYSIS DIPSTICK
Bilirubin, UA: NEGATIVE
Blood, UA: NEGATIVE
Glucose, UA: NEGATIVE
Ketones, UA: NEGATIVE
Leukocytes, UA: NEGATIVE
Nitrite, UA: NEGATIVE
Protein, UA: NEGATIVE
Spec Grav, UA: 1.02 (ref 1.010–1.025)
Urobilinogen, UA: 0.2 E.U./dL
pH, UA: 6 (ref 5.0–8.0)

## 2019-04-19 LAB — URINE CULTURE

## 2019-04-19 MED ORDER — NITROFURANTOIN MONOHYD MACRO 100 MG PO CAPS: 100.0000 mg | ORAL_CAPSULE | Freq: Every day | ORAL | 1 refills | Status: DC

## 2019-04-19 NOTE — Addendum Note (Signed)
Addended by: Augusto Gamble on: 04/19/2019 08:18 PM   Modules accepted: Orders

## 2019-04-21 ENCOUNTER — Telehealth: Payer: Self-pay

## 2019-04-21 NOTE — Telephone Encounter (Signed)
Pt's daughter called to go over mother's test results. No answer LM via VM that her mother had an UTI and medication was sent in to treat it.

## 2019-04-30 ENCOUNTER — Encounter: Payer: Self-pay | Admitting: Obstetrics and Gynecology

## 2019-04-30 ENCOUNTER — Other Ambulatory Visit: Payer: Self-pay

## 2019-04-30 ENCOUNTER — Ambulatory Visit (INDEPENDENT_AMBULATORY_CARE_PROVIDER_SITE_OTHER): Payer: Medicare Other | Admitting: Obstetrics and Gynecology

## 2019-04-30 VITALS — BP 157/71 | HR 65 | Ht 64.0 in | Wt 150.0 lb

## 2019-04-30 DIAGNOSIS — N814 Uterovaginal prolapse, unspecified: Secondary | ICD-10-CM | POA: Diagnosis not present

## 2019-04-30 DIAGNOSIS — Z4689 Encounter for fitting and adjustment of other specified devices: Secondary | ICD-10-CM | POA: Diagnosis not present

## 2019-04-30 DIAGNOSIS — K591 Functional diarrhea: Secondary | ICD-10-CM

## 2019-04-30 DIAGNOSIS — Z87440 Personal history of urinary (tract) infections: Secondary | ICD-10-CM

## 2019-04-30 NOTE — Progress Notes (Signed)
Pt present for pessary check. Pt stated that her pessary continues to fall out and will not stay in. Pt's daughter is concerned about all of the UTI her mother(pt) is having.

## 2019-04-30 NOTE — Progress Notes (Signed)
    GYNECOLOGY PROGRESS NOTE  Subjective:    Patient ID: JODIANN SMOUSE, female    DOB: [birth year], 85 y.o.   MRN: [id number]    Patient is a 85 y.o. [name] female who presents for pessary check.  Pessary currently in place for cystocele with vaginal prolapse. Also with vaginal atrophy. Denies vaginal bleeding.  Patient's daughter is concerned as she is noting that the patient is having more frequent UTI's lately.  Has been treated twice in the past 3-4 months.  She does have a prior history of UTI's but has improved since pessary use.  Her daughter also reports that her mother's pessary has been expelled at least 2-3 times at home, which has never happened before.  The patient has been having explosive diarrhea over the past several weeks which may be contributing to this.    The following portions of the patient's history were reviewed and updated as appropriate: allergies, current medications, past family history, past medical history, past social history, past surgical history and problem list  .  Review of Systems A comprehensive review of systems was negative.   Objective:   Blood pressure (!) 157/71, pulse 65, height 5\' 4"  (1.626 m), weight 150 lb (68 kg). General appearance: alert and no distress Abdomen: soft, non-tender; bowel sounds normal; no masses,  no organomegaly.  Pelvic:  The patient's size 5 pessary was removed, cleaned and replaced without complications.  Speculum examination revealed normal vaginal mucosa with no lesions or lacerations.    Assessment:   Pessary maintenance Cystocele with vaginal vault prolapse Vaginal atrophy (moderate)  H/o recurrent UTI's Functional diarrhea  Plan:    - Pessary cleaned today, reinserted.  The patient should return in 3 months for a pessary check.  Continue to use Trimo-San gel internally once or twice weekly, and continue to use Premarin cream for external use  for h/o recurrent UTIs.   - Last UA with culture noting E. Coli.  This is second occurrence for this type of bacteria.  Patient's daughter thinks that her mother may have a contaminated urine specimen as she notes her mother is not wiped well at her facility.  She has completed the Macrobid for treatment and is now on prophylactic therapy due to h/o recurrent UTI's, but wonders if this could be a cause of her mother's diarrhea. Advised that she can discontinue the prophylactic antibiotic at this time. She reports that her motehr's PCP gave her something to help treat the diarrhea.      Rubie Maid, MD [facility]

## 2019-05-02 ENCOUNTER — Encounter: Payer: Medicare Other | Admitting: Obstetrics and Gynecology

## 2019-05-05 ENCOUNTER — Other Ambulatory Visit: Payer: Self-pay

## 2019-05-13 ENCOUNTER — Inpatient Hospital Stay: Payer: Medicare Other | Attending: Oncology

## 2019-05-13 ENCOUNTER — Other Ambulatory Visit: Payer: Self-pay

## 2019-05-13 ENCOUNTER — Inpatient Hospital Stay (HOSPITAL_BASED_OUTPATIENT_CLINIC_OR_DEPARTMENT_OTHER): Payer: Medicare Other | Admitting: Oncology

## 2019-05-13 ENCOUNTER — Encounter: Payer: Self-pay | Admitting: Oncology

## 2019-05-13 VITALS — BP 141/83 | HR 91 | Temp 98.6°F | Wt 145.8 lb

## 2019-05-13 DIAGNOSIS — Z9012 Acquired absence of left breast and nipple: Secondary | ICD-10-CM | POA: Diagnosis not present

## 2019-05-13 DIAGNOSIS — I10 Essential (primary) hypertension: Secondary | ICD-10-CM | POA: Diagnosis not present

## 2019-05-13 DIAGNOSIS — K449 Diaphragmatic hernia without obstruction or gangrene: Secondary | ICD-10-CM | POA: Diagnosis not present

## 2019-05-13 DIAGNOSIS — R5381 Other malaise: Secondary | ICD-10-CM | POA: Insufficient documentation

## 2019-05-13 DIAGNOSIS — R197 Diarrhea, unspecified: Secondary | ICD-10-CM | POA: Insufficient documentation

## 2019-05-13 DIAGNOSIS — D509 Iron deficiency anemia, unspecified: Secondary | ICD-10-CM | POA: Insufficient documentation

## 2019-05-13 DIAGNOSIS — Z79899 Other long term (current) drug therapy: Secondary | ICD-10-CM | POA: Insufficient documentation

## 2019-05-13 DIAGNOSIS — Z853 Personal history of malignant neoplasm of breast: Secondary | ICD-10-CM | POA: Diagnosis not present

## 2019-05-13 DIAGNOSIS — R5383 Other fatigue: Secondary | ICD-10-CM | POA: Insufficient documentation

## 2019-05-13 DIAGNOSIS — Z7901 Long term (current) use of anticoagulants: Secondary | ICD-10-CM | POA: Insufficient documentation

## 2019-05-13 LAB — CBC
HCT: 41.9 % (ref 36.0–46.0)
Hemoglobin: 13.5 g/dL (ref 12.0–15.0)
MCH: 31.8 pg (ref 26.0–34.0)
MCHC: 32.2 g/dL (ref 30.0–36.0)
MCV: 98.6 fL (ref 80.0–100.0)
Platelets: 294 10*3/uL (ref 150–400)
RBC: 4.25 MIL/uL (ref 3.87–5.11)
RDW: 14.1 % (ref 11.5–15.5)
WBC: 10 10*3/uL (ref 4.0–10.5)
nRBC: 0 % (ref 0.0–0.2)

## 2019-05-13 LAB — IRON AND TIBC
Iron: 83 ug/dL (ref 28–170)
Saturation Ratios: 31 % (ref 10.4–31.8)
TIBC: 269 ug/dL (ref 250–450)
UIBC: 186 ug/dL

## 2019-05-13 LAB — FERRITIN: Ferritin: 305 ng/mL (ref 11–307)

## 2019-05-13 NOTE — Progress Notes (Signed)
She is tired all the time but she is 49 and she has aches and pains

## 2019-05-19 ENCOUNTER — Encounter: Payer: Self-pay | Admitting: Oncology

## 2019-05-19 NOTE — Progress Notes (Signed)
Hematology/Oncology Consult note [facility]  Telephone:([phone] [phone]  Patient Care Team: [name], MD as PCP - General (Family Medicine) Bary Castilla Forest Gleason, MD as Consulting Physician (General Surgery) [name], MD as Referring Physician (Family Medicine)   Name of the patient: Lossie Kalp  [id number]  1920/12/20   Date of visit: 05/19/19  Diagnosis- iron deficiency anemia  Chief complaint/ Reason for visit- routine f/u of iron deficiency anemia  Heme/Onc history: patient is a 85 year old female with severe iron deficiency anemia. In March 2018 she was admitted for GI bleeding and underwent EGD and colonoscopy. EGD showed mild esophagitis and a large hiatal hernia. Colonoscopy was essentially normal except for an small polyp. She was given blood transfusion and was discharged on oral iron. She has not been able to tolerate oral iron. She was readmitted to the hospital in June 2018 again with severe iron deficiency anemia. She received blood transfusion as well as IV iron. She has not had smallbowel capsule study yet and she is 87 and there were concerns anemia and didn't find evidence of bleeding on capsule study she would not be able to tolerate a push enteroscopy. She has therefore been referred to me for IV iron. She has had urinalysis in the past which has been negative for hematuria. Most recent CBC from 11/28/2016 showed white count of 8.2, H&H of 7.6/24.4 with an MCV of 73.8 and a platelet count of 359  Patient reports dark stools but denies any melanotic tarry stools or bright red blood per rectum. Denies any nosebleeds or gum bleeds. Denies any consistent use of NSAIDs. She does have gradually worsening shortness of breath as well as leg swelling over the last few months. She is independent of her ADLs  patient received 2 doses of feraheme in July 2018. She then presented to her pcp with RLE pain and swelling. She  underwent doppler of RLE which showed:Positive for deep venous thrombosis of the right popliteal vein, and some of the right calf veins.  Patient was started on lovenox and then switched to coumadin and has completed 3 months of anticoagulation  Interval history-  she has been feeling more over the last 3 to 4 months.  Denies any blood in stool denies any dark melanotic stools.  Has been having on and off diarrhea for which she sees Dr. Vicente Males.  Reports that her diarrhea got better briefly for the last 1 week she is to having frequent episodes of diarrhea.  ECOG PS- 1 Pain scale- 0  Review of systems- Review of Systems  Constitutional: Positive for malaise/fatigue. Negative for chills, fever and weight loss.  HENT: Negative for congestion, ear discharge and nosebleeds.   Eyes: Negative for blurred vision.  Respiratory: Negative for cough, hemoptysis, sputum production, shortness of breath and wheezing.   Cardiovascular: Negative for chest pain, palpitations, orthopnea and claudication.  Gastrointestinal: Positive for diarrhea. Negative for abdominal pain, blood in stool, constipation, heartburn, melena, nausea and vomiting.  Genitourinary: Negative for dysuria, flank pain, frequency, hematuria and urgency.  Musculoskeletal: Negative for back pain, joint pain and myalgias.  Skin: Negative for rash.  Neurological: Negative for dizziness, tingling, focal weakness, seizures, weakness and headaches.  Endo/Heme/Allergies: Does not bruise/bleed easily.  Psychiatric/Behavioral: Negative for depression and suicidal ideas. The patient does not have insomnia.       Allergies  Allergen Reactions  . Codeine Nausea And Vomiting     Past Medical History:  Diagnosis Date  .  Allergy   . Arthritis    back  . Benign neoplasm of skin, site unspecified   . Bowel trouble   . Breast cancer ([place])   . Cancer ([place])    colon 20 years ago  . Cancer of breast [facility]) April 10,.2013   Left breast, 3.8 cm,  4 cm axillary node, T2, N1a, ER negative PR negative, HER-2/neu not amplified.  . Cataract   . Colon cancer ([place])   . Hard of hearing   . Heartburn   . Hiatal hernia   . History of stomach ulcers   . History of stomach ulcers   . Hypertension 2012  . Personal history of malignant neoplasm of breast 2013   LEFT MASTECTOMY,left modified radical mastectomy on September 06, 2011 483.8 cm primary tumor with a 4.6 cm axillary metastasis. 3 of 13 nodes were positive. T2,N1a lesion. This was a triple negative lesion  . Shingles Dec 2015  . Vaginal atrophy 08/10/2015  . Wrist fracture, right      Past Surgical History:  Procedure Laterality Date  . ABDOMINAL HYSTERECTOMY     42 YEARS AGO  . BREAST SURGERY Left 09-06-11   left modified radical mastectomy on September 06, 2011 483.8 cm primary tumor with a 4.6 cm axillary metastasis. 3 of 13 nodes were positive. T2,N1a lesion. This was a triple negative lesion  . CARPAL TUNNEL RELEASE Right September 25, 2014   Skip Estimable, M.D.  . COLON RESECTION  1985  . COLON SURGERY  20 YEARS AGO  . COLONOSCOPY  2012   DR. ELLIOTT  . COLONOSCOPY WITH PROPOFOL N/A 08/25/2016   Procedure: COLONOSCOPY WITH PROPOFOL;  Surgeon: [name], MD;  Location: [facility] ENDOSCOPY;  Service: Gastroenterology;  Laterality: N/A;  . ESOPHAGOGASTRODUODENOSCOPY (EGD) WITH PROPOFOL N/A 08/23/2016   Procedure: ESOPHAGOGASTRODUODENOSCOPY (EGD) WITH PROPOFOL;  Surgeon: [name], MD;  Location: [facility] ENDOSCOPY;  Service: Endoscopy;  Laterality: N/A;  . MASTECTOMY Left 2013   left modified radical mastectomy on September 06, 2011 Stage 2; 3.8 cm primary tumor with a 4.6 cm axillary metastasis. 3 of 13 nodes were positive. T2,N1a lesion. This was a triple negative lesion  . UPPER GI ENDOSCOPY  2012   BLEEDING    Social History   Socioeconomic History  . Marital status: Widowed    Spouse name: Not on file  . Number of children: Not on file  . Years of education: Not on file  . Highest  education level: Not on file  Occupational History  . Not on file  Tobacco Use  . Smoking status: Never Smoker  . Smokeless tobacco: Never Used  Substance and Sexual Activity  . Alcohol use: No  . Drug use: No  . Sexual activity: Never    Birth control/protection: None  Other Topics Concern  . Not on file  Social History Narrative  . Not on file   Social Determinants of Health   Financial Resource Strain:   . Difficulty of Paying Living Expenses: Not on file  Food Insecurity:   . Worried About Charity fundraiser in the Last Year: Not on file  . Ran Out of Food in the Last Year: Not on file  Transportation Needs:   . Lack of Transportation (Medical): Not on file  . Lack of Transportation (Non-Medical): Not on file  Physical Activity:   . Days of Exercise per Week: Not on file  . Minutes of Exercise per Session: Not on file  Stress:   . Feeling  of Stress : Not on file  Social Connections:   . Frequency of Communication with Friends and Family: Not on file  . Frequency of Social Gatherings with Friends and Family: Not on file  . Attends Religious Services: Not on file  . Active Member of Clubs or Organizations: Not on file  . Attends Archivist Meetings: Not on file  . Marital Status: Not on file  Intimate Partner Violence:   . Fear of Current or Ex-Partner: Not on file  . Emotionally Abused: Not on file  . Physically Abused: Not on file  . Sexually Abused: Not on file    Family History  Problem Relation Age of Onset  . Lung cancer Brother        colono ca  . Cancer Brother   . Skin cancer Daughter   . Leukemia Father   . Cancer Father   . Colon cancer Mother   . Cancer Mother   . Skin cancer Son   . Kidney disease Neg Hx   . Bladder Cancer Neg Hx      Current Outpatient Medications:  .  acetaminophen (TYLENOL) 650 MG CR tablet, Take by mouth., Disp: , Rfl:  .  Ascorbic Acid (VITAMIN C) 1000 MG tablet, Take 1,000 mg by mouth daily., Disp: , Rfl:    .  Cholecalciferol (VITAMIN D) 50 MCG (2000 UT) CAPS, Take 1 capsule by mouth daily., Disp: , Rfl:  .  cholestyramine (QUESTRAN) 4 g packet, Take 1 packet (4 g total) by mouth daily., Disp: 90 packet, Rfl: 0 .  citalopram (CELEXA) 10 MG tablet, Take 10 mg by mouth daily., Disp: , Rfl:  .  colestipol (COLESTID) 1 g tablet, Take 1 tablet by mouth 2 (two) times daily., Disp: , Rfl:  .  Cranberry 500 MG CAPS, Take by mouth daily., Disp: , Rfl:  .  estradiol (ESTRACE VAGINAL) 0.1 MG/GM vaginal cream, Apply 0.'5mg'$  (pea-sized amount)  just inside the vaginal introitus with a finger-tip every night for two weeks and then Monday, Wednesday and Friday nights., Disp: 30 g, Rfl: 12 .  gabapentin (NEURONTIN) 100 MG capsule, Take on tablet each night for two weeks. Increase to two tablets nightly if no improvement. (Patient taking differently: Take 100 mg by mouth 3 (three) times daily. Take on tablet each night for two weeks. Increase to 3 tablets nightly.), Disp: 90 capsule, Rfl: 6 .  Lactobacillus Rhamnosus, GG, (PROBIOTIC COLIC PO), Take 1 Dose by mouth daily. doterra brands, Disp: , Rfl:  .  loperamide (IMODIUM A-D) 2 MG tablet, Take 2 mg by mouth as needed for diarrhea or loose stools., Disp: , Rfl:  .  Melatonin 5 MG CAPS, Take 1 capsule by mouth. , Disp: , Rfl:  .  metoprolol succinate (TOPROL-XL) 25 MG 24 hr tablet, Take 25 mg by mouth daily., Disp: , Rfl:  .  OXYQUINOLONE SULFATE VAGINAL 0.025 % GEL, Place 1 Applicatorful vaginally 2 (two) times a week., Disp: 1 Tube, Rfl: 3 .  pantoprazole (PROTONIX) 40 MG tablet, TAKE 1 TABLET BY MOUTH ONCE DAILY, Disp: 90 tablet, Rfl: 2 .  Potassium 99 MG TABS, Take 1 tablet by mouth daily., Disp: , Rfl:  .  vitamin B-12 (CYANOCOBALAMIN) 1000 MCG tablet, Take 1,000 mcg by mouth daily., Disp: , Rfl:   Physical exam:  Vitals:   05/13/19 1419  BP: (!) 141/83  Pulse: 91  Temp: 98.6 F (37 C)  TempSrc: Tympanic  Weight: 145 lb 12.8 oz (66.1 kg)  Physical  Exam Constitutional:      General: She is not in acute distress.    Comments: Appears fatigued  HENT:     Head: Normocephalic and atraumatic.  Eyes:     Pupils: Pupils are equal, round, and reactive to light.  Cardiovascular:     Rate and Rhythm: Normal rate and regular rhythm.     Heart sounds: Normal heart sounds.  Pulmonary:     Effort: Pulmonary effort is normal.     Breath sounds: Normal breath sounds.  Abdominal:     General: Bowel sounds are normal.     Palpations: Abdomen is soft.  Musculoskeletal:     Cervical back: Normal range of motion.  Skin:    General: Skin is warm and dry.  Neurological:     Mental Status: She is alert and oriented to person, place, and time.      CMP Latest Ref Rng & Units 11/27/2016  Glucose 65 - 99 mg/dL 95  BUN 6 - 20 mg/dL 9  Creatinine 0.44 - 1.00 mg/dL 0.63  Sodium 135 - 145 mmol/L 138  Potassium 3.5 - 5.1 mmol/L 3.7  Chloride 101 - 111 mmol/L 107  CO2 22 - 32 mmol/L 25  Calcium 8.9 - 10.3 mg/dL 8.0(L)  Total Protein 6.4 - 8.2 g/dL -  Total Bilirubin 0.2 - 1.0 mg/dL -  Alkaline Phos 50 - 136 Unit/L -  AST 15 - 37 Unit/L -  ALT 12 - 78 U/L -   CBC Latest Ref Rng & Units 05/13/2019  WBC 4.0 - 10.5 K/uL 10.0  Hemoglobin 12.0 - 15.0 g/dL 13.5  Hematocrit 36.0 - 46.0 % 41.9  Platelets 150 - 400 K/uL 294     Assessment and plan- Patient is a 85 y.o. female with iron deficiency anemia here for routine follow up  Patient continues to have stable hemoglobin of around 13. Iron studies are normal. No need for iv iron at this time. Repeat iron studies in 3 and 6 months. See dr Janese Banks in 6 mongths  Diarrhea- She will f/u with Dr. Vicente Males for this    Visit Diagnosis 1. Iron deficiency anemia, unspecified iron deficiency anemia type      Dr. Randa Evens, MD, MPH [facility] [phone] 05/19/2019 8:18 AM

## 2019-05-26 ENCOUNTER — Other Ambulatory Visit: Payer: Self-pay

## 2019-05-26 MED ORDER — CHOLESTYRAMINE 4 G PO PACK: 4.0000 g | PACK | Freq: Every day | ORAL | 5 refills | Status: DC

## 2019-06-09 ENCOUNTER — Ambulatory Visit: Payer: Self-pay | Admitting: Surgery

## 2019-06-13 ENCOUNTER — Encounter: Payer: Medicare Other | Admitting: Obstetrics and Gynecology

## 2019-07-07 ENCOUNTER — Ambulatory Visit: Payer: Medicare Other

## 2019-07-18 ENCOUNTER — Ambulatory Visit
Admission: RE | Admit: 2019-07-18 | Discharge: 2019-07-18 | Disposition: A | Discharge status: Home / Self Care | Payer: Medicare Other | Source: Ambulatory Visit | Attending: Student | Admitting: Student

## 2019-07-18 ENCOUNTER — Ambulatory Visit: Payer: Medicare Other

## 2019-07-18 ENCOUNTER — Other Ambulatory Visit: Payer: Self-pay | Admitting: Student

## 2019-07-18 ENCOUNTER — Other Ambulatory Visit: Payer: Self-pay

## 2019-07-18 DIAGNOSIS — M25571 Pain in right ankle and joints of right foot: Secondary | ICD-10-CM

## 2019-07-18 DIAGNOSIS — M25471 Effusion, right ankle: Secondary | ICD-10-CM

## 2019-07-18 DIAGNOSIS — Z86718 Personal history of other venous thrombosis and embolism: Secondary | ICD-10-CM

## 2019-07-29 ENCOUNTER — Encounter: Payer: Self-pay | Admitting: Obstetrics and Gynecology

## 2019-07-29 ENCOUNTER — Ambulatory Visit (INDEPENDENT_AMBULATORY_CARE_PROVIDER_SITE_OTHER): Payer: Medicare Other | Admitting: Obstetrics and Gynecology

## 2019-07-29 ENCOUNTER — Other Ambulatory Visit: Payer: Self-pay

## 2019-07-29 VITALS — BP 158/65 | HR 60 | Ht 64.0 in | Wt 153.5 lb

## 2019-07-29 DIAGNOSIS — N814 Uterovaginal prolapse, unspecified: Secondary | ICD-10-CM | POA: Diagnosis not present

## 2019-07-29 DIAGNOSIS — K591 Functional diarrhea: Secondary | ICD-10-CM | POA: Insufficient documentation

## 2019-07-29 DIAGNOSIS — Z4689 Encounter for fitting and adjustment of other specified devices: Secondary | ICD-10-CM | POA: Diagnosis not present

## 2019-07-29 DIAGNOSIS — N952 Postmenopausal atrophic vaginitis: Secondary | ICD-10-CM | POA: Diagnosis not present

## 2019-07-29 DIAGNOSIS — Z87440 Personal history of urinary (tract) infections: Secondary | ICD-10-CM

## 2019-07-29 NOTE — Progress Notes (Signed)
    GYNECOLOGY PROGRESS NOTE  Subjective:    Patient ID: [name], female    DOB: August 03, 1920, 86 y.o.   MRN: [id number]    Patient is a 86 y.o. [name] female who presents for pessary check.  Pessary currently in place for cystocele with vaginal prolapse. Also with vaginal atrophy. Denies vaginal bleeding. Patient's mother is concerned about the possibility of pessary expulsion with Jan's bouts of frequent diarrhea. Is taking a medication to help but does not take consistently. Patient reports that when she feels it is coming down, she will push the pessary back up.    The following portions of the patient's history were reviewed and updated as appropriate: allergies, current medications, past family history, past medical history, past social history, past surgical history and problem list  .  Review of Systems A comprehensive review of systems was negative.   Objective:   Blood pressure (!) 158/65, pulse 60, height 5\' 4"  (1.626 m), weight 153 lb 8 oz (69.6 kg). General appearance: alert and no distress Abdomen: soft, non-tender; bowel sounds normal; no masses,  no organomegaly.  Pelvic:  The patient's size 5 pessary was removed, cleaned and replaced without complications.  Speculum examination revealed normal vaginal mucosa with no lesions or lacerations.    Assessment:   Pessary maintenance Cystocele with vaginal vault prolapse Vaginal atrophy (moderate)  H/o recurrent UTI's Functional diarrhea  Plan:    - Pessary cleaned today, reinserted.  The patient should return in 3 months for a pessary check.  Continue to use Trimo-San gel internally once or twice weekly, and continue to use Premarin cream for external use  for h/o recurrent UTIs.   -Encouraged more consistency with anti-motility medications.      Rubie Maid, MD [facility]

## 2019-07-29 NOTE — Progress Notes (Signed)
Pt present for pessary check. Pt's daughter stated pt has been having bad bm causing her to push the pessary out.

## 2019-09-11 ENCOUNTER — Inpatient Hospital Stay: Payer: Medicare Other | Attending: Oncology

## 2019-09-11 ENCOUNTER — Other Ambulatory Visit: Payer: Self-pay

## 2019-09-11 ENCOUNTER — Other Ambulatory Visit: Payer: Self-pay | Admitting: *Deleted

## 2019-09-11 DIAGNOSIS — D509 Iron deficiency anemia, unspecified: Secondary | ICD-10-CM | POA: Diagnosis not present

## 2019-09-11 LAB — IRON AND TIBC
Iron: 82 ug/dL (ref 28–170)
Saturation Ratios: 28 % (ref 10.4–31.8)
TIBC: 290 ug/dL (ref 250–450)
UIBC: 208 ug/dL

## 2019-09-11 LAB — CBC
HCT: 40.5 % (ref 36.0–46.0)
Hemoglobin: 13.3 g/dL (ref 12.0–15.0)
MCH: 31.7 pg (ref 26.0–34.0)
MCHC: 32.8 g/dL (ref 30.0–36.0)
MCV: 96.7 fL (ref 80.0–100.0)
Platelets: 276 10*3/uL (ref 150–400)
RBC: 4.19 MIL/uL (ref 3.87–5.11)
RDW: 12.8 % (ref 11.5–15.5)
WBC: 7.6 10*3/uL (ref 4.0–10.5)
nRBC: 0 % (ref 0.0–0.2)

## 2019-09-11 LAB — FERRITIN: Ferritin: 181 ng/mL (ref 11–307)

## 2019-10-29 ENCOUNTER — Encounter: Payer: Medicare Other | Admitting: Obstetrics and Gynecology

## 2019-11-18 ENCOUNTER — Encounter: Payer: Medicare Other | Admitting: Obstetrics and Gynecology

## 2019-11-26 ENCOUNTER — Ambulatory Visit (INDEPENDENT_AMBULATORY_CARE_PROVIDER_SITE_OTHER): Payer: Medicare Other | Admitting: Obstetrics and Gynecology

## 2019-11-26 ENCOUNTER — Encounter: Payer: Medicare Other | Admitting: Obstetrics and Gynecology

## 2019-11-26 ENCOUNTER — Other Ambulatory Visit: Payer: Self-pay

## 2019-11-26 ENCOUNTER — Encounter: Payer: Self-pay | Admitting: Obstetrics and Gynecology

## 2019-11-26 VITALS — BP 133/78 | HR 66 | Ht 64.0 in | Wt 154.4 lb

## 2019-11-26 DIAGNOSIS — N814 Uterovaginal prolapse, unspecified: Secondary | ICD-10-CM

## 2019-11-26 DIAGNOSIS — N952 Postmenopausal atrophic vaginitis: Secondary | ICD-10-CM

## 2019-11-26 DIAGNOSIS — Z4689 Encounter for fitting and adjustment of other specified devices: Secondary | ICD-10-CM | POA: Diagnosis not present

## 2019-11-26 DIAGNOSIS — Z87440 Personal history of urinary (tract) infections: Secondary | ICD-10-CM

## 2019-11-26 LAB — POCT URINALYSIS DIPSTICK
Bilirubin, UA: NEGATIVE
Blood, UA: NEGATIVE
Glucose, UA: NEGATIVE
Ketones, UA: NEGATIVE
Nitrite, UA: NEGATIVE
Protein, UA: NEGATIVE
Spec Grav, UA: 1.01 (ref 1.010–1.025)
Urobilinogen, UA: 0.2 E.U./dL
pH, UA: 6 (ref 5.0–8.0)

## 2019-11-26 NOTE — Progress Notes (Signed)
    GYNECOLOGY PROGRESS NOTE  Subjective:    Patient ID: [name], female    DOB: 1920-12-04, 86 y.o.   MRN: [id number]    Patient is a 86 y.o. [id number] female who presents for pessary check.  Pessary currently in place for cystocele with vaginal prolapse. Also with vaginal atrophy. Denies vaginal bleeding.  The following portions of the patient's history were reviewed and updated as appropriate: allergies, current medications, past family history, past medical history, past social history, past surgical history and problem list  .  Review of Systems A comprehensive review of systems was negative.   Objective:   Blood pressure 133/78, pulse 66, height 5\' 4"  (1.626 m), weight 154 lb 6.4 oz (70 kg). General appearance: alert and no distress Abdomen: soft, non-tender; bowel sounds normal; no masses,  no organomegaly.  Pelvic:  The patient's size 5 pessary was removed, cleaned and replaced without complications.  Speculum examination revealed normal vaginal mucosa with no lesions or lacerations.   Assessment:   Pessary maintenance Cystocele with vaginal vault prolapse Vaginal atrophy (moderate)  H/o recurrent UTI's   Plan:    - Pessary cleaned today, reinserted.  The patient should return in 3 months for a pessary check.  Continue to use Trimo-San gel internally once or twice weekly, and continue to use Premarin cream for external use  for h/o recurrent UTIs.        Rubie Maid, MD [facility]

## 2019-11-26 NOTE — Progress Notes (Signed)
Pt present for routine pessary check. Pt stated that she was doing well no problems.

## 2019-11-26 NOTE — Addendum Note (Signed)
Addended by: Edwyna Shell on: 11/26/2019 04:55 PM   Modules accepted: Orders

## 2019-12-02 ENCOUNTER — Ambulatory Visit (INDEPENDENT_AMBULATORY_CARE_PROVIDER_SITE_OTHER): Payer: Medicare Other | Admitting: Obstetrics and Gynecology

## 2019-12-02 ENCOUNTER — Other Ambulatory Visit: Payer: Self-pay

## 2019-12-02 ENCOUNTER — Telehealth: Payer: Self-pay | Admitting: Obstetrics and Gynecology

## 2019-12-02 ENCOUNTER — Encounter: Payer: Self-pay | Admitting: Obstetrics and Gynecology

## 2019-12-02 VITALS — BP 146/68 | HR 69 | Ht 64.0 in | Wt 154.0 lb

## 2019-12-02 DIAGNOSIS — N814 Uterovaginal prolapse, unspecified: Secondary | ICD-10-CM | POA: Diagnosis not present

## 2019-12-02 DIAGNOSIS — K591 Functional diarrhea: Secondary | ICD-10-CM | POA: Diagnosis not present

## 2019-12-02 DIAGNOSIS — Z4689 Encounter for fitting and adjustment of other specified devices: Secondary | ICD-10-CM | POA: Diagnosis not present

## 2019-12-02 NOTE — Progress Notes (Signed)
    GYNECOLOGY PROGRESS NOTE  Subjective:    Patient ID: [name], female    DOB: 11-03-1920, 86 y.o.   MRN: [id number]    Patient is a 86 y.o. [id number] female who presents for pessary reinsertion.  Patient's daughter notes expulsion yesterday. Recently had routine pessary check last week.  She reports the patient attempted to replace it without success. She also reports that the patient has had some bouts of explosive diarrhea and sometimes strains (but this is because she takes stool softeners regularly although not always necessary).   The following portions of the patient's history were reviewed and updated as appropriate: allergies, current medications, past family history, past medical history, past social history, past surgical history and problem list  .  Review of Systems A comprehensive review of systems was negative.   Objective:   Blood pressure (!) 146/68, pulse 69, height 5\' 4"  (1.626 m), weight 154 lb (69.9 kg). General appearance: alert and no distress Abdomen: soft, non-tender; bowel sounds normal; no masses,  no organomegaly.  Pelvic:  The patient's size 5 pessary was removed, cleaned and replaced without complications.  Speculum examination revealed normal vaginal mucosa with no lesions or lacerations.   Assessment:   Pessary maintenance Cystocele with vaginal vault prolapse Vaginal atrophy (moderate)    Plan:    - Pessary cleaned today, reinserted.  Strongly encouraged decreasing use of stool softeners for now. The patient should return in 3 months for her regularly scheduled  pessary check.      Rubie Maid, MD [facility]

## 2019-12-02 NOTE — Telephone Encounter (Signed)
pts daughter called in and stated that her mothers pessary fell out. The pt is wanting to be seen soon. I told her I didn't have anything till the 14th. She is requesting something sooner. The pts requesting a call back. Please advise

## 2019-12-02 NOTE — Progress Notes (Signed)
Pt present for pessary reinsertion. Pt's daughter stated that her mother's (patient) pessary fell out the day after her appointment on 11/26/2019.

## 2020-01-12 ENCOUNTER — Inpatient Hospital Stay: Payer: Medicare Other | Attending: Oncology

## 2020-01-12 ENCOUNTER — Other Ambulatory Visit: Payer: Self-pay | Admitting: *Deleted

## 2020-01-12 ENCOUNTER — Other Ambulatory Visit: Payer: Self-pay

## 2020-01-12 DIAGNOSIS — D509 Iron deficiency anemia, unspecified: Secondary | ICD-10-CM

## 2020-01-12 DIAGNOSIS — Z806 Family history of leukemia: Secondary | ICD-10-CM | POA: Diagnosis not present

## 2020-01-12 DIAGNOSIS — Z9012 Acquired absence of left breast and nipple: Secondary | ICD-10-CM | POA: Insufficient documentation

## 2020-01-12 DIAGNOSIS — Z801 Family history of malignant neoplasm of trachea, bronchus and lung: Secondary | ICD-10-CM | POA: Insufficient documentation

## 2020-01-12 DIAGNOSIS — Z85038 Personal history of other malignant neoplasm of large intestine: Secondary | ICD-10-CM | POA: Diagnosis present

## 2020-01-12 DIAGNOSIS — Z853 Personal history of malignant neoplasm of breast: Secondary | ICD-10-CM | POA: Diagnosis not present

## 2020-01-12 DIAGNOSIS — Z79899 Other long term (current) drug therapy: Secondary | ICD-10-CM | POA: Diagnosis not present

## 2020-01-12 DIAGNOSIS — Z90710 Acquired absence of both cervix and uterus: Secondary | ICD-10-CM | POA: Diagnosis not present

## 2020-01-12 DIAGNOSIS — Z800 Family history of malignant neoplasm of digestive organs: Secondary | ICD-10-CM | POA: Diagnosis not present

## 2020-01-12 DIAGNOSIS — I10 Essential (primary) hypertension: Secondary | ICD-10-CM | POA: Diagnosis not present

## 2020-01-12 LAB — IRON AND TIBC
Iron: 72 ug/dL (ref 28–170)
Saturation Ratios: 25 % (ref 10.4–31.8)
TIBC: 288 ug/dL (ref 250–450)
UIBC: 216 ug/dL

## 2020-01-12 LAB — CBC
HCT: 38.9 % (ref 36.0–46.0)
Hemoglobin: 13.1 g/dL (ref 12.0–15.0)
MCH: 32 pg (ref 26.0–34.0)
MCHC: 33.7 g/dL (ref 30.0–36.0)
MCV: 95.1 fL (ref 80.0–100.0)
Platelets: 272 10*3/uL (ref 150–400)
RBC: 4.09 MIL/uL (ref 3.87–5.11)
RDW: 13.5 % (ref 11.5–15.5)
WBC: 7.5 10*3/uL (ref 4.0–10.5)
nRBC: 0 % (ref 0.0–0.2)

## 2020-01-12 LAB — FERRITIN: Ferritin: 131 ng/mL (ref 11–307)

## 2020-01-13 ENCOUNTER — Inpatient Hospital Stay (HOSPITAL_BASED_OUTPATIENT_CLINIC_OR_DEPARTMENT_OTHER): Payer: Medicare Other | Admitting: Oncology

## 2020-01-13 DIAGNOSIS — D509 Iron deficiency anemia, unspecified: Secondary | ICD-10-CM | POA: Diagnosis not present

## 2020-01-17 ENCOUNTER — Encounter: Payer: Self-pay | Admitting: Oncology

## 2020-01-17 NOTE — Progress Notes (Signed)
I connected with [name] on 01/17/20 at  1:15 PM EDT by telephone visit and verified that I am speaking with the correct person using two identifiers.   I discussed the limitations, risks, security and privacy concerns of performing an evaluation and management service by telemedicine and the availability of in-person appointments. I also discussed with the patient that there may be a patient responsible charge related to this service. The patient expressed understanding and agreed to proceed.  Other persons participating in the visit and their role in the encounter:  Patients daughter  Patient's location:  home Provider's location:  work  Risk analyst Complaint:  Routine f/u of iron deficiency anemia  History of present illness: patient is a 86 year old female with severe iron deficiency anemia. In March 2018 she was admitted for GI bleeding and underwent EGD and colonoscopy. EGD showed mild esophagitis and a large hiatal hernia. Colonoscopy was essentially normal except for an small polyp. She was given blood transfusion and was discharged on oral iron. She has not been able to tolerate oral iron. She was readmitted to the hospital in June 2018 again with severe iron deficiency anemia. She received blood transfusion as well as IV iron. She has not had smallbowel capsule study yet and she is 49 and there were concerns anemia and didn't find evidence of bleeding on capsule study she would not be able to tolerate a push enteroscopy. She has therefore been referred to me for IV iron. She has had urinalysis in the past which has been negative for hematuria. Most recent CBC from 11/28/2016 showed white count of 8.2, H&H of 7.6/24.4 with an MCV of 73.8 and a platelet count of 359  Patient reports dark stools but denies any melanotic tarry stools or bright red blood per rectum. Denies any nosebleeds or gum bleeds. Denies any consistent use of NSAIDs. She does have gradually worsening shortness of breath as  well as leg swelling over the last few months. She is independent of her ADLs  patient received 2 doses of feraheme in July 2018. She then presented to her pcp with RLE pain and swelling. She underwent doppler of RLE which showed:Positive for deep venous thrombosis of the right popliteal vein, and some of the right calf veins.  Patient was started on lovenox and then switched to coumadin and has completed 3 months of anticoagulation   Interval history: Patient is able to get around her house . Has a caregiver at home for assistance. Appetite is good. Spends most of morning and afternoon resting. Denies any blood in stool or urine   Review of Systems  Constitutional: Positive for malaise/fatigue. Negative for chills, fever and weight loss.  HENT: Negative for congestion, ear discharge and nosebleeds.   Eyes: Negative for blurred vision.  Respiratory: Negative for cough, hemoptysis, sputum production, shortness of breath and wheezing.   Cardiovascular: Negative for chest pain, palpitations, orthopnea and claudication.  Gastrointestinal: Negative for abdominal pain, blood in stool, constipation, diarrhea, heartburn, melena, nausea and vomiting.  Genitourinary: Negative for dysuria, flank pain, frequency, hematuria and urgency.  Musculoskeletal: Negative for back pain, joint pain and myalgias.  Skin: Negative for rash.  Neurological: Negative for dizziness, tingling, focal weakness, seizures, weakness and headaches.  Endo/Heme/Allergies: Does not bruise/bleed easily.  Psychiatric/Behavioral: Negative for depression and suicidal ideas. The patient does not have insomnia.     Allergies  Allergen Reactions  . Codeine Nausea And Vomiting    Past Medical History:  Diagnosis Date  . Allergy   .  Arthritis    back  . Benign neoplasm of skin, site unspecified   . Bowel trouble   . Breast cancer ([place])   . Cancer ([place])    colon 20 years ago  . Cancer of breast [facility]) April 10,.2013    Left breast, 3.8 cm, 4 cm axillary node, T2, N1a, ER negative PR negative, HER-2/neu not amplified.  . Cataract   . Colon cancer ([place])   . Hard of hearing   . Heartburn   . Hiatal hernia   . History of stomach ulcers   . History of stomach ulcers   . Hypertension 2012  . Personal history of malignant neoplasm of breast 2013   LEFT MASTECTOMY,left modified radical mastectomy on September 06, 2011 483.8 cm primary tumor with a 4.6 cm axillary metastasis. 3 of 13 nodes were positive. T2,N1a lesion. This was a triple negative lesion  . Shingles Dec 2015  . Vaginal atrophy 08/10/2015  . Wrist fracture, right     Past Surgical History:  Procedure Laterality Date  . ABDOMINAL HYSTERECTOMY     42 YEARS AGO  . BREAST SURGERY Left 09-06-11   left modified radical mastectomy on September 06, 2011 483.8 cm primary tumor with a 4.6 cm axillary metastasis. 3 of 13 nodes were positive. T2,N1a lesion. This was a triple negative lesion  . CARPAL TUNNEL RELEASE Right September 25, 2014   Skip Estimable, M.D.  . COLON RESECTION  1985  . COLON SURGERY  20 YEARS AGO  . COLONOSCOPY  2012   DR. ELLIOTT  . COLONOSCOPY WITH PROPOFOL N/A 08/25/2016   Procedure: COLONOSCOPY WITH PROPOFOL;  Surgeon: [name], MD;  Location: [facility] ENDOSCOPY;  Service: Gastroenterology;  Laterality: N/A;  . ESOPHAGOGASTRODUODENOSCOPY (EGD) WITH PROPOFOL N/A 08/23/2016   Procedure: ESOPHAGOGASTRODUODENOSCOPY (EGD) WITH PROPOFOL;  Surgeon: [name], MD;  Location: [facility] ENDOSCOPY;  Service: Endoscopy;  Laterality: N/A;  . MASTECTOMY Left 2013   left modified radical mastectomy on September 06, 2011 Stage 2; 3.8 cm primary tumor with a 4.6 cm axillary metastasis. 3 of 13 nodes were positive. T2,N1a lesion. This was a triple negative lesion  . UPPER GI ENDOSCOPY  2012   BLEEDING    Social History   Socioeconomic History  . Marital status: Widowed    Spouse name: Not on file  . Number of children: Not on file  . Years of education: Not on file   . Highest education level: Not on file  Occupational History  . Not on file  Tobacco Use  . Smoking status: Never Smoker  . Smokeless tobacco: Never Used  Vaping Use  . Vaping Use: Never used  Substance and Sexual Activity  . Alcohol use: No  . Drug use: No  . Sexual activity: Never    Birth control/protection: None  Other Topics Concern  . Not on file  Social History Narrative  . Not on file   Social Determinants of Health   Financial Resource Strain:   . Difficulty of Paying Living Expenses: Not on file  Food Insecurity:   . Worried About Charity fundraiser in the Last Year: Not on file  . Ran Out of Food in the Last Year: Not on file  Transportation Needs:   . Lack of Transportation (Medical): Not on file  . Lack of Transportation (Non-Medical): Not on file  Physical Activity:   . Days of Exercise per Week: Not on file  . Minutes of Exercise per Session: Not on file  Stress:   .  Feeling of Stress : Not on file  Social Connections:   . Frequency of Communication with Friends and Family: Not on file  . Frequency of Social Gatherings with Friends and Family: Not on file  . Attends Religious Services: Not on file  . Active Member of Clubs or Organizations: Not on file  . Attends Archivist Meetings: Not on file  . Marital Status: Not on file  Intimate Partner Violence:   . Fear of Current or Ex-Partner: Not on file  . Emotionally Abused: Not on file  . Physically Abused: Not on file  . Sexually Abused: Not on file    Family History  Problem Relation Age of Onset  . Lung cancer Brother        colono ca  . Cancer Brother   . Skin cancer Daughter   . Leukemia Father   . Cancer Father   . Colon cancer Mother   . Cancer Mother   . Skin cancer Son   . Kidney disease Neg Hx   . Bladder Cancer Neg Hx      Current Outpatient Medications:  .  acetaminophen (TYLENOL) 650 MG CR tablet, Take by mouth., Disp: , Rfl:  .  Ascorbic Acid (VITAMIN C) 1000 MG  tablet, Take 1,000 mg by mouth daily., Disp: , Rfl:  .  Cholecalciferol (VITAMIN D) 50 MCG (2000 UT) CAPS, Take 1 capsule by mouth daily., Disp: , Rfl:  .  cholestyramine (QUESTRAN) 4 g packet, Take 1 packet (4 g total) by mouth daily., Disp: 60 packet, Rfl: 5 .  citalopram (CELEXA) 10 MG tablet, Take 10 mg by mouth daily., Disp: , Rfl:  .  Cranberry 500 MG CAPS, Take by mouth daily., Disp: , Rfl:  .  gabapentin (NEURONTIN) 100 MG capsule, Take on tablet each night for two weeks. Increase to two tablets nightly if no improvement. (Patient taking differently: Take 100 mg by mouth 3 (three) times daily. Take on tablet each night for two weeks. Increase to 3 tablets nightly.), Disp: 90 capsule, Rfl: 6 .  Lactobacillus Rhamnosus, GG, (PROBIOTIC COLIC PO), Take 1 Dose by mouth daily. doterra brands, Disp: , Rfl:  .  loperamide (IMODIUM A-D) 2 MG tablet, Take 2 mg by mouth as needed for diarrhea or loose stools., Disp: , Rfl:  .  Melatonin 5 MG CAPS, Take 1 capsule by mouth. , Disp: , Rfl:  .  metoprolol succinate (TOPROL-XL) 25 MG 24 hr tablet, Take 25 mg by mouth daily., Disp: , Rfl:  .  OXYQUINOLONE SULFATE VAGINAL 0.025 % GEL, Place 1 Applicatorful vaginally 2 (two) times a week., Disp: 1 Tube, Rfl: 3 .  pantoprazole (PROTONIX) 40 MG tablet, TAKE 1 TABLET BY MOUTH ONCE DAILY, Disp: 90 tablet, Rfl: 2 .  Potassium 99 MG TABS, Take 1 tablet by mouth daily., Disp: , Rfl:  .  vitamin B-12 (CYANOCOBALAMIN) 1000 MCG tablet, Take 1,000 mcg by mouth daily., Disp: , Rfl:  .  estradiol (ESTRACE VAGINAL) 0.1 MG/GM vaginal cream, Apply 0.78m (pea-sized amount)  just inside the vaginal introitus with a finger-tip every night for two weeks and then Monday, Wednesday and Friday nights. (Patient not taking: Reported on 01/13/2020), Disp: 30 g, Rfl: 12  No results found.  No images are attached to the encounter.   CMP Latest Ref Rng & Units 11/27/2016  Glucose 65 - 99 mg/dL 95  BUN 6 - 20 mg/dL 9  Creatinine 0.44 -  1.00 mg/dL 0.63  Sodium 135 -  145 mmol/L 138  Potassium 3.5 - 5.1 mmol/L 3.7  Chloride 101 - 111 mmol/L 107  CO2 22 - 32 mmol/L 25  Calcium 8.9 - 10.3 mg/dL 8.0(L)  Total Protein 6.4 - 8.2 g/dL -  Total Bilirubin 0.2 - 1.0 mg/dL -  Alkaline Phos 50 - 136 Unit/L -  AST 15 - 37 Unit/L -  ALT 12 - 78 U/L -   CBC Latest Ref Rng & Units 01/12/2020  WBC 4.0 - 10.5 K/uL 7.5  Hemoglobin 12.0 - 15.0 g/dL 13.1  Hematocrit 36 - 46 % 38.9  Platelets 150 - 400 K/uL 272     Assessment and plan:Patient is a 86 year old female with h/o iron deficiency anemia and this is a routine f/u visit.  Patient is not currently anemic. Iron studies are normal. She does not require IV iron at this time.   Follow-up instructions:given the stability of her hb, I will check cbc ferritin and iron studies in 4 and 8 months and see her in 8 months  I discussed the assessment and treatment plan with the patient. The patient was provided an opportunity to ask questions and all were answered. The patient agreed with the plan and demonstrated an understanding of the instructions.   The patient was advised to call back or seek an in-person evaluation if the symptoms worsen or if the condition fails to improve as anticipated.  I provided 14 minutes of non face-to-face telephone visit time during this encounter, and > 50% was spent counseling as documented under my assessment & plan.  Visit Diagnosis: 1. Iron deficiency anemia, unspecified iron deficiency anemia type     Dr. Randa Evens, MD, MPH [facility] at [facility] Tel- [phone] 01/17/2020 5:49 PM

## 2020-02-26 ENCOUNTER — Encounter: Payer: Medicare Other | Admitting: Obstetrics and Gynecology

## 2020-03-02 ENCOUNTER — Encounter: Payer: Medicare Other | Admitting: Obstetrics and Gynecology

## 2020-03-03 IMAGING — CR DG WRIST COMPLETE 3+V*R*
1 series · 4 of 4 positions shown · non-contrast
Comparison: None.

CLINICAL DATA: Fall

EXAM:
RIGHT WRIST - COMPLETE 3+ VIEW

[Series 1: x wrist pa right · 0.14mm/px · 4 of 4 slices shown]
[im 1/4]
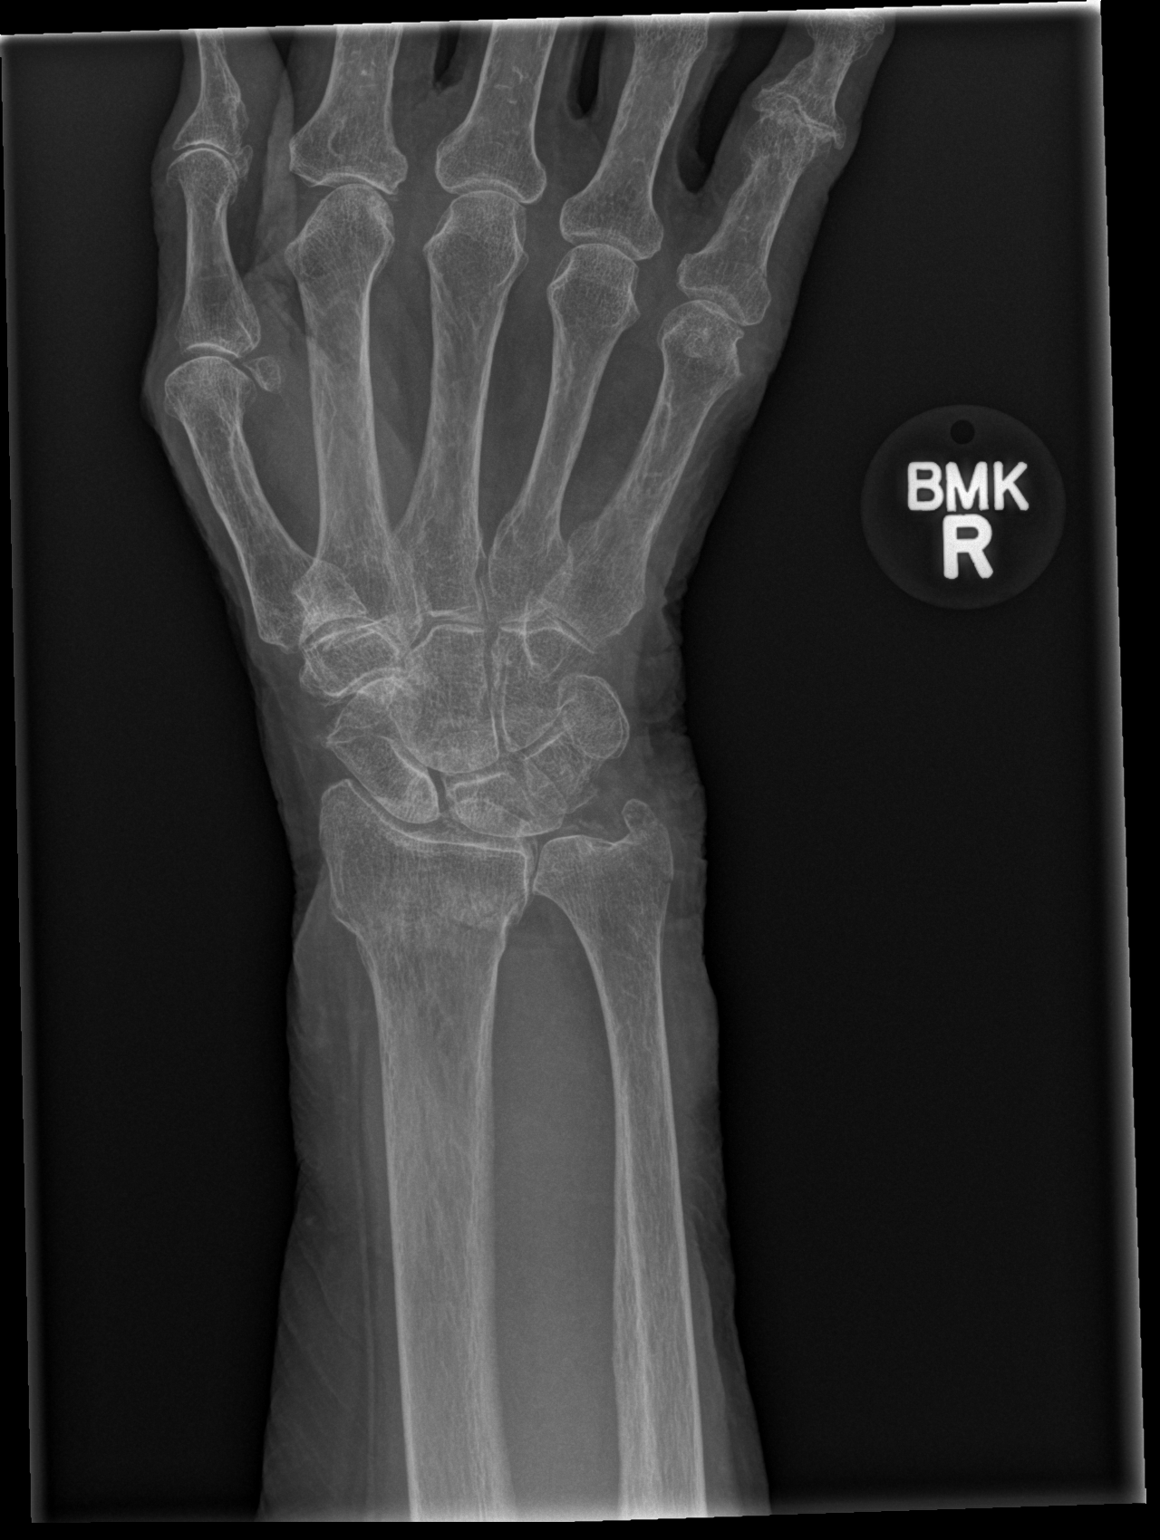
[im 2/4]
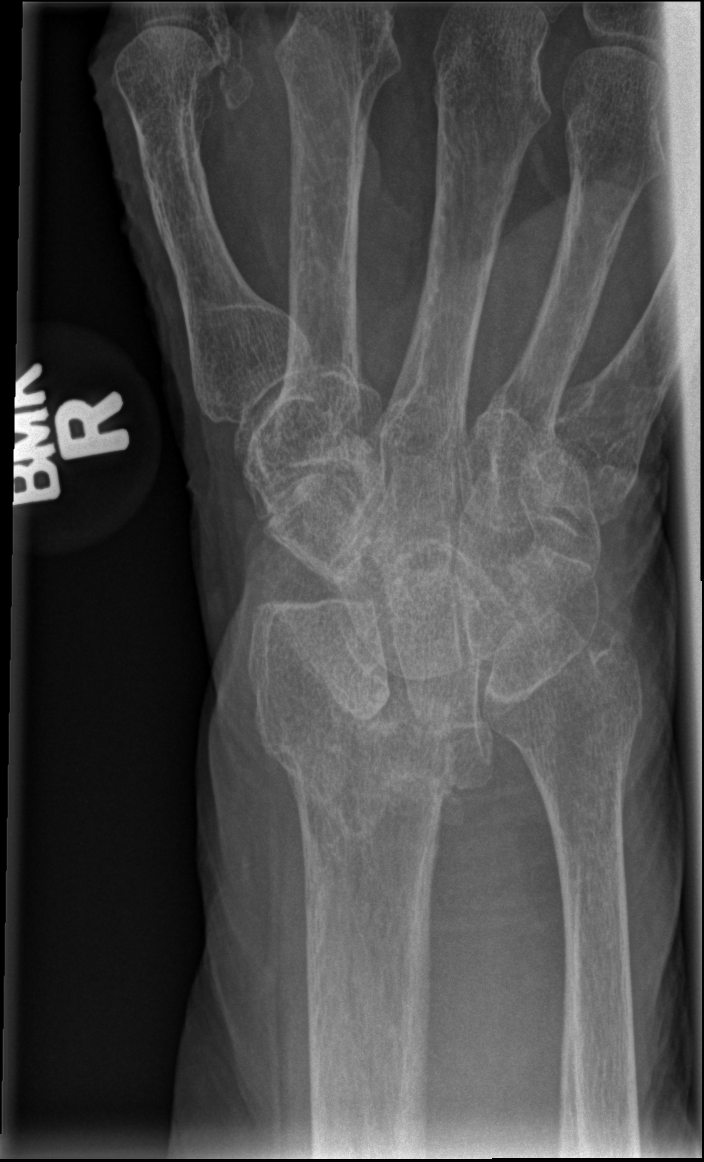
[im 3/4]
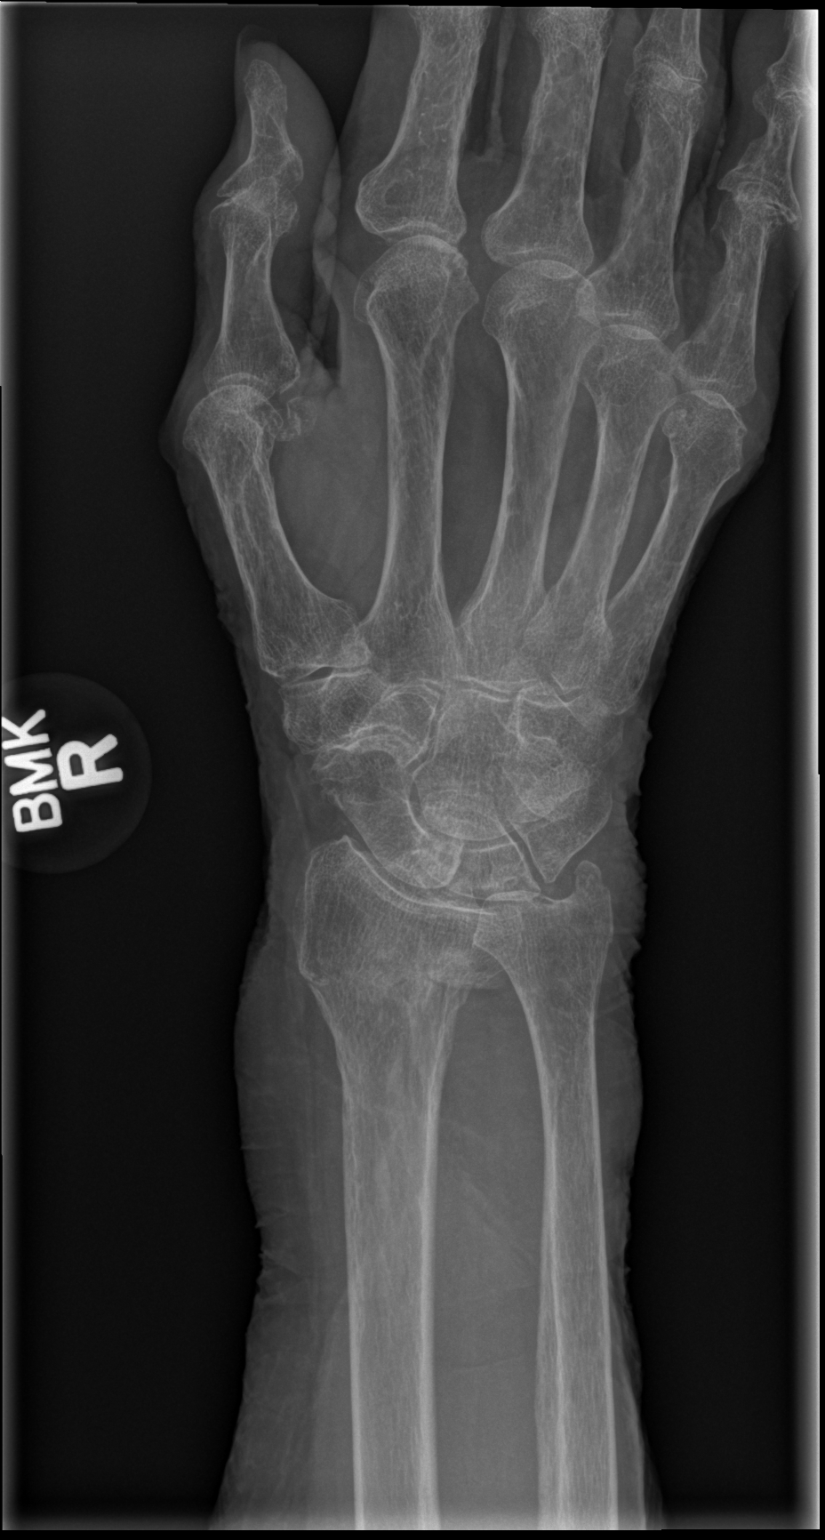
[im 4/4]
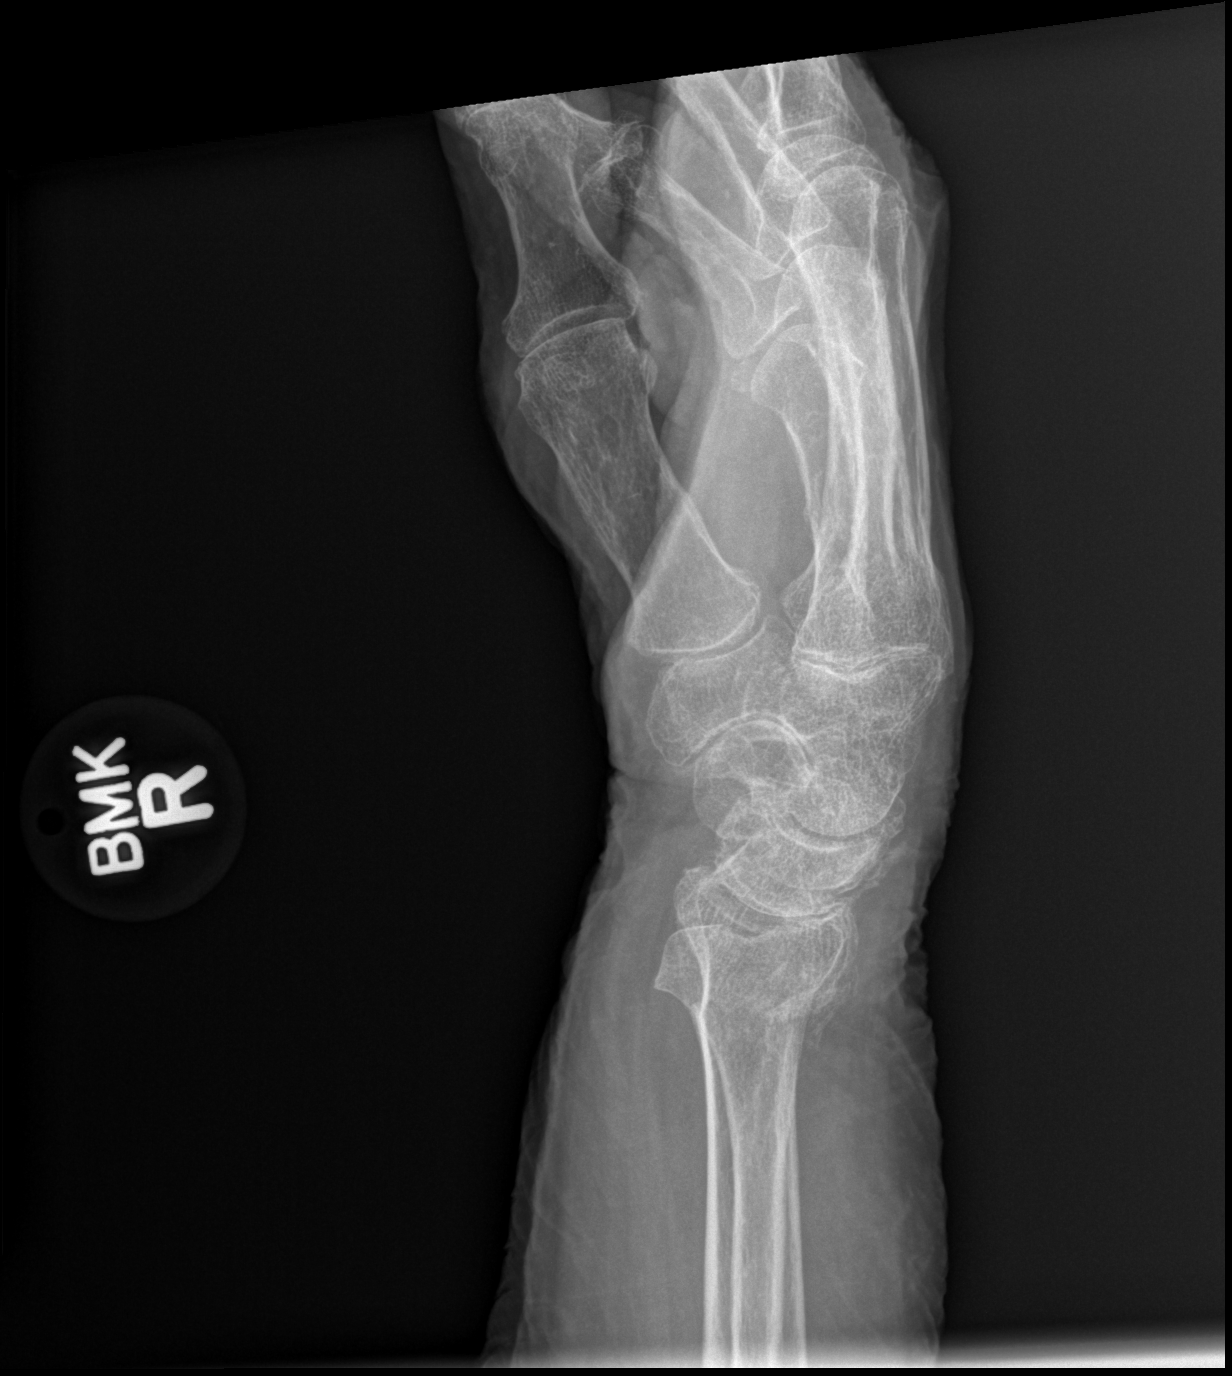

[4 of 4 positions shown; findings below may reference images not displayed]

FINDINGS: The osseous structures appear diffusely demineralized which may
limit detection of small or nondisplaced fractures. There is a
mildly comminuted, impacted fracture of the distal radial metaphysis
with extension into the distal radioulnar joint and likely extension
to the radiocarpal joint as well. A minimally displaced fracture of
the ulnar styloid process is noted as well. Carpal arcs are
maintained. Questionable lucency through the scaphoid without clear
cortical disruption. May reflect posttraumatic versus degenerative
change. Circumferential swelling of the wrist.
IMPRESSION: 1. Mildly comminuted, impacted fracture of the distal radial
metaphysis with extension into the distal radioulnar joint and
likely extension to the radiocarpal joint.
2. Minimally displaced fracture of the ulnar styloid process.
3. Questionable lucency through the scaphoid without clear cortical
disruption. Could reflect an acute osseous injury or degenerative
change.

## 2020-03-23 ENCOUNTER — Encounter: Payer: Medicare Other | Admitting: Obstetrics and Gynecology

## 2020-03-31 ENCOUNTER — Ambulatory Visit (INDEPENDENT_AMBULATORY_CARE_PROVIDER_SITE_OTHER): Payer: Medicare Other | Admitting: Obstetrics and Gynecology

## 2020-03-31 ENCOUNTER — Other Ambulatory Visit: Payer: Self-pay

## 2020-03-31 ENCOUNTER — Encounter: Payer: Self-pay | Admitting: Obstetrics and Gynecology

## 2020-03-31 VITALS — BP 161/75 | HR 94 | Ht 64.0 in | Wt 153.6 lb

## 2020-03-31 DIAGNOSIS — Z4689 Encounter for fitting and adjustment of other specified devices: Secondary | ICD-10-CM | POA: Diagnosis not present

## 2020-03-31 DIAGNOSIS — N814 Uterovaginal prolapse, unspecified: Secondary | ICD-10-CM | POA: Diagnosis not present

## 2020-03-31 NOTE — Progress Notes (Signed)
Pt present for pessary check. Pt stated that she was doing well no problems.  

## 2020-03-31 NOTE — Progress Notes (Signed)
    GYNECOLOGY PROGRESS NOTE  Subjective:    Patient ID: [name], female    DOB: March 21, 1921, 86 y.o.   MRN: [id number]    Patient is a 86 y.o. [id number] female who presents for pessary reinsertion.  Doing well overall.  Recently celebrated her 89th birthday. Notes she has not had any further issues with pessary expulsion.  The following portions of the patient's history were reviewed and updated as appropriate: allergies, current medications, past family history, past medical history, past social history, past surgical history and problem list  .  Review of Systems A comprehensive review of systems was negative.   Objective:   Blood pressure (!) 161/75, pulse 94, height 5\' 4"  (1.626 m), weight 153 lb 9.6 oz (69.7 kg). General appearance: alert and no distress Abdomen: soft, non-tender; bowel sounds normal; no masses,  no organomegaly.  Pelvic:  The patient's size 5 pessary was removed, cleaned and replaced without complications.  Speculum examination revealed normal vaginal mucosa with no lesions or lacerations.   Assessment:   Pessary maintenance Cystocele with vaginal vault prolapse Vaginal atrophy (moderate)    Plan:    - Pessary cleaned today, reinserted.  Strongly encouraged decreasing use of stool softeners for now. The patient should return in 3 months for her regularly scheduled  pessary check.      Rubie Maid, MD [facility]

## 2020-05-14 ENCOUNTER — Inpatient Hospital Stay: Payer: Medicare Other | Attending: Oncology

## 2020-05-14 DIAGNOSIS — D509 Iron deficiency anemia, unspecified: Secondary | ICD-10-CM

## 2020-05-14 LAB — CBC
HCT: 41.9 % (ref 36.0–46.0)
Hemoglobin: 14 g/dL (ref 12.0–15.0)
MCH: 31.5 pg (ref 26.0–34.0)
MCHC: 33.4 g/dL (ref 30.0–36.0)
MCV: 94.4 fL (ref 80.0–100.0)
Platelets: 318 10*3/uL (ref 150–400)
RBC: 4.44 MIL/uL (ref 3.87–5.11)
RDW: 13.9 % (ref 11.5–15.5)
WBC: 8.7 10*3/uL (ref 4.0–10.5)
nRBC: 0 % (ref 0.0–0.2)

## 2020-05-14 LAB — IRON AND TIBC
Iron: 79 ug/dL (ref 28–170)
Saturation Ratios: 25 % (ref 10.4–31.8)
TIBC: 321 ug/dL (ref 250–450)
UIBC: 242 ug/dL

## 2020-05-14 LAB — FERRITIN: Ferritin: 85 ng/mL (ref 11–307)

## 2020-07-01 ENCOUNTER — Encounter: Payer: Medicare Other | Admitting: Obstetrics and Gynecology

## 2020-08-04 ENCOUNTER — Encounter: Payer: Self-pay | Admitting: Obstetrics and Gynecology

## 2020-08-04 ENCOUNTER — Other Ambulatory Visit: Payer: Self-pay

## 2020-08-04 ENCOUNTER — Ambulatory Visit (INDEPENDENT_AMBULATORY_CARE_PROVIDER_SITE_OTHER): Payer: Medicare Other | Admitting: Obstetrics and Gynecology

## 2020-08-04 VITALS — BP 161/73 | HR 67 | Ht 64.0 in | Wt 155.0 lb

## 2020-08-04 DIAGNOSIS — Z4689 Encounter for fitting and adjustment of other specified devices: Secondary | ICD-10-CM

## 2020-08-04 DIAGNOSIS — Z87440 Personal history of urinary (tract) infections: Secondary | ICD-10-CM

## 2020-08-04 LAB — POCT URINALYSIS DIPSTICK
Bilirubin, UA: NEGATIVE
Glucose, UA: NEGATIVE
Ketones, UA: NEGATIVE
Nitrite, UA: POSITIVE
Protein, UA: NEGATIVE
Spec Grav, UA: 1.025 (ref 1.010–1.025)
Urobilinogen, UA: 0.2 E.U./dL
pH, UA: 6 (ref 5.0–8.0)

## 2020-08-04 MED ORDER — AMOXICILLIN-POT CLAVULANATE 875-125 MG PO TABS: 1.0000 | ORAL_TABLET | Freq: Two times a day (BID) | ORAL | 1 refills | Status: DC

## 2020-08-04 NOTE — Progress Notes (Signed)
    GYNECOLOGY PROGRESS NOTE  Subjective:    Patient ID: [name], female    DOB: 1921/03/26, 87 y.o.   MRN: [id number]    Patient is a 87 y.o. [id number] female who presents for pessary reinsertion.  Per her daughter, patient has been a little more confused recently. Also notes that patient has been without one of her medications that she uses to help prevent UTI's as she has a history of recurrent UTI's. Was out of medication for almost 2 weeks. Patient denies vaginal bleeding or pain but thinks the pessary may have come out.    The following portions of the patient's history were reviewed and updated as appropriate: allergies, current medications, past family history, past medical history, past social history, past surgical history and problem list  .  Review of Systems A comprehensive review of systems was negative.   Objective:   Blood pressure (!) 161/73, pulse 67, height 5\' 4"  (1.626 m), weight 155 lb (70.3 kg). General appearance: alert and no distress. Oriented to person and place. Abdomen: soft, non-tender; bowel sounds normal; no masses,  no organomegaly.  Pelvic:  The patient's size 5 pessary was located in the vagina, removed, cleaned and replaced without complications.  Speculum examination revealed normal vaginal mucosa with no lesions or lacerations.   Labs:  Results for orders placed or performed in visit on 08/04/20  POCT urinalysis dipstick  Result Value Ref Range   Color, UA auburn    Clarity, UA cloudy    Glucose, UA Negative Negative   Bilirubin, UA neg    Ketones, UA neg    Spec Grav, UA 1.025 1.010 - 1.025   Blood, UA non hemo trace    pH, UA 6.0 5.0 - 8.0   Protein, UA Negative Negative   Urobilinogen, UA 0.2 0.2 or 1.0 E.U./dL   Nitrite, UA positive    Leukocytes, UA Moderate (2+) (A) Negative   Appearance auburn;cloudy    Odor       Assessment:   Pessary maintenance Cystocele with vaginal vault prolapse Vaginal atrophy (moderate)  History  of recurrent UTI's  Plan:    - Pessary cleaned today, reinserted. The patient should return in 3 months for her regularly scheduled pessary check.  -  Continue Trimosan gel as prescribed.  - Patient with UTI today on UA with positive nitrites. Will treat with Augmentin. Advised patient's mother to resume her on prophylactic therapy.     Rubie Maid, MD [facility]

## 2020-08-04 NOTE — Progress Notes (Signed)
Pt present for pessary check. Pt stated having uti symptoms.

## 2020-08-04 NOTE — Patient Instructions (Signed)
Urinary Tract Infection, Adult A urinary tract infection (UTI) is an infection of any part of the urinary tract. The urinary tract includes:  The kidneys.  The ureters.  The bladder.  The urethra. These organs make, store, and get rid of pee (urine) in the body. What are the causes? This infection is caused by germs (bacteria) in your genital area. These germs grow and cause swelling (inflammation) of your urinary tract. What increases the risk? The following factors may make you more likely to develop this condition:  Using a small, thin tube (catheter) to drain pee.  Not being able to control when you pee or poop (incontinence).  Being female. If you are female, these things can increase the risk: ? Using these methods to prevent pregnancy:  A medicine that kills sperm (spermicide).  A device that blocks sperm (diaphragm). ? Having low levels of a female hormone (estrogen). ? Being pregnant. You are more likely to develop this condition if:  You have genes that add to your risk.  You are sexually active.  You take antibiotic medicines.  You have trouble peeing because of: ? A prostate that is bigger than normal, if you are female. ? A blockage in the part of your body that drains pee from the bladder. ? A kidney stone. ? A nerve condition that affects your bladder. ? Not getting enough to drink. ? Not peeing often enough.  You have other conditions, such as: ? Diabetes. ? A weak disease-fighting system (immune system). ? Sickle cell disease. ? Gout. ? Injury of the spine. What are the signs or symptoms? Symptoms of this condition include:  Needing to pee right away.  Peeing small amounts often.  Pain or burning when peeing.  Blood in the pee.  Pee that smells bad or not like normal.  Trouble peeing.  Pee that is cloudy.  Fluid coming from the vagina, if you are female.  Pain in the belly or lower back. Other symptoms include:  Vomiting.  Not  feeling hungry.  Feeling mixed up (confused). This may be the first symptom in older adults.  Being tired and grouchy (irritable).  A fever.  Watery poop (diarrhea). How is this treated?  Taking antibiotic medicine.  Taking other medicines.  Drinking enough water. In some cases, you may need to see a specialist. Follow these instructions at home: Medicines  Take over-the-counter and prescription medicines only as told by your doctor.  If you were prescribed an antibiotic medicine, take it as told by your doctor. Do not stop taking it even if you start to feel better. General instructions  Make sure you: ? Pee until your bladder is empty. ? Do not hold pee for a long time. ? Empty your bladder after sex. ? Wipe from front to back after peeing or pooping if you are a female. Use each tissue one time when you wipe.  Drink enough fluid to keep your pee pale yellow.  Keep all follow-up visits.   Contact a doctor if:  You do not get better after 1-2 days.  Your symptoms go away and then come back. Get help right away if:  You have very bad back pain.  You have very bad pain in your lower belly.  You have a fever.  You have chills.  You feeling like you will vomit or you vomit. Summary  A urinary tract infection (UTI) is an infection of any part of the urinary tract.  This condition is caused by   germs in your genital area.  There are many risk factors for a UTI.  Treatment includes antibiotic medicines.  Drink enough fluid to keep your pee pale yellow. This information is not intended to replace advice given to you by your health care provider. Make sure you discuss any questions you have with your health care provider. Document Revised: 12/26/2019 Document Reviewed: 12/26/2019 Elsevier Patient Education  2021 Elsevier Inc.  

## 2020-08-07 LAB — URINE CULTURE

## 2020-08-21 ENCOUNTER — Inpatient Hospital Stay
Admission: EM | Admit: 2020-08-21 | Discharge: 2020-08-30 | DRG: 871 | Disposition: A | Discharge status: Skilled Nursing Facility | Payer: Medicare Other | Attending: Internal Medicine | Admitting: Internal Medicine

## 2020-08-21 ENCOUNTER — Emergency Department: Payer: Medicare Other

## 2020-08-21 ENCOUNTER — Other Ambulatory Visit: Payer: Self-pay

## 2020-08-21 DIAGNOSIS — S32415A Nondisplaced fracture of anterior wall of left acetabulum, initial encounter for closed fracture: Secondary | ICD-10-CM | POA: Diagnosis present

## 2020-08-21 DIAGNOSIS — Z808 Family history of malignant neoplasm of other organs or systems: Secondary | ICD-10-CM

## 2020-08-21 DIAGNOSIS — X31XXXA Exposure to excessive natural cold, initial encounter: Secondary | ICD-10-CM | POA: Diagnosis not present

## 2020-08-21 DIAGNOSIS — F05 Delirium due to known physiological condition: Secondary | ICD-10-CM | POA: Diagnosis present

## 2020-08-21 DIAGNOSIS — M79605 Pain in left leg: Secondary | ICD-10-CM

## 2020-08-21 DIAGNOSIS — R443 Hallucinations, unspecified: Secondary | ICD-10-CM | POA: Diagnosis present

## 2020-08-21 DIAGNOSIS — M79604 Pain in right leg: Secondary | ICD-10-CM

## 2020-08-21 DIAGNOSIS — M7989 Other specified soft tissue disorders: Secondary | ICD-10-CM

## 2020-08-21 DIAGNOSIS — J9601 Acute respiratory failure with hypoxia: Secondary | ICD-10-CM | POA: Diagnosis not present

## 2020-08-21 DIAGNOSIS — A419 Sepsis, unspecified organism: Secondary | ICD-10-CM | POA: Diagnosis present

## 2020-08-21 DIAGNOSIS — S32049A Unspecified fracture of fourth lumbar vertebra, initial encounter for closed fracture: Secondary | ICD-10-CM | POA: Diagnosis present

## 2020-08-21 DIAGNOSIS — I110 Hypertensive heart disease with heart failure: Secondary | ICD-10-CM | POA: Diagnosis present

## 2020-08-21 DIAGNOSIS — Y92009 Unspecified place in unspecified non-institutional (private) residence as the place of occurrence of the external cause: Secondary | ICD-10-CM

## 2020-08-21 DIAGNOSIS — B961 Klebsiella pneumoniae [K. pneumoniae] as the cause of diseases classified elsewhere: Secondary | ICD-10-CM | POA: Diagnosis present

## 2020-08-21 DIAGNOSIS — I272 Pulmonary hypertension, unspecified: Secondary | ICD-10-CM | POA: Diagnosis present

## 2020-08-21 DIAGNOSIS — I2699 Other pulmonary embolism without acute cor pulmonale: Secondary | ICD-10-CM | POA: Diagnosis present

## 2020-08-21 DIAGNOSIS — F0390 Unspecified dementia without behavioral disturbance: Secondary | ICD-10-CM | POA: Diagnosis present

## 2020-08-21 DIAGNOSIS — F329 Major depressive disorder, single episode, unspecified: Secondary | ICD-10-CM | POA: Diagnosis present

## 2020-08-21 DIAGNOSIS — I471 Supraventricular tachycardia: Secondary | ICD-10-CM | POA: Diagnosis not present

## 2020-08-21 DIAGNOSIS — S32810A Multiple fractures of pelvis with stable disruption of pelvic ring, initial encounter for closed fracture: Secondary | ICD-10-CM | POA: Diagnosis present

## 2020-08-21 DIAGNOSIS — Z9181 History of falling: Secondary | ICD-10-CM

## 2020-08-21 DIAGNOSIS — R739 Hyperglycemia, unspecified: Secondary | ICD-10-CM | POA: Diagnosis present

## 2020-08-21 DIAGNOSIS — R0602 Shortness of breath: Secondary | ICD-10-CM | POA: Diagnosis not present

## 2020-08-21 DIAGNOSIS — T699XXA Effect of reduced temperature, unspecified, initial encounter: Secondary | ICD-10-CM | POA: Diagnosis present

## 2020-08-21 DIAGNOSIS — I5033 Acute on chronic diastolic (congestive) heart failure: Secondary | ICD-10-CM | POA: Diagnosis not present

## 2020-08-21 DIAGNOSIS — Z66 Do not resuscitate: Secondary | ICD-10-CM | POA: Diagnosis present

## 2020-08-21 DIAGNOSIS — I10 Essential (primary) hypertension: Secondary | ICD-10-CM | POA: Diagnosis present

## 2020-08-21 DIAGNOSIS — Z7189 Other specified counseling: Secondary | ICD-10-CM | POA: Diagnosis not present

## 2020-08-21 DIAGNOSIS — N390 Urinary tract infection, site not specified: Secondary | ICD-10-CM | POA: Diagnosis present

## 2020-08-21 DIAGNOSIS — E876 Hypokalemia: Secondary | ICD-10-CM | POA: Diagnosis not present

## 2020-08-21 DIAGNOSIS — Z515 Encounter for palliative care: Secondary | ICD-10-CM | POA: Diagnosis not present

## 2020-08-21 DIAGNOSIS — I4510 Unspecified right bundle-branch block: Secondary | ICD-10-CM | POA: Diagnosis not present

## 2020-08-21 DIAGNOSIS — Z20822 Contact with and (suspected) exposure to covid-19: Secondary | ICD-10-CM | POA: Diagnosis present

## 2020-08-21 DIAGNOSIS — W1830XA Fall on same level, unspecified, initial encounter: Secondary | ICD-10-CM | POA: Diagnosis present

## 2020-08-21 DIAGNOSIS — Z801 Family history of malignant neoplasm of trachea, bronchus and lung: Secondary | ICD-10-CM

## 2020-08-21 DIAGNOSIS — I82411 Acute embolism and thrombosis of right femoral vein: Secondary | ICD-10-CM | POA: Diagnosis present

## 2020-08-21 DIAGNOSIS — Z885 Allergy status to narcotic agent status: Secondary | ICD-10-CM | POA: Diagnosis not present

## 2020-08-21 DIAGNOSIS — Z85038 Personal history of other malignant neoplasm of large intestine: Secondary | ICD-10-CM

## 2020-08-21 DIAGNOSIS — Z87440 Personal history of urinary (tract) infections: Secondary | ICD-10-CM

## 2020-08-21 DIAGNOSIS — R0600 Dyspnea, unspecified: Secondary | ICD-10-CM

## 2020-08-21 DIAGNOSIS — Z9012 Acquired absence of left breast and nipple: Secondary | ICD-10-CM

## 2020-08-21 DIAGNOSIS — R9431 Abnormal electrocardiogram [ECG] [EKG]: Secondary | ICD-10-CM | POA: Diagnosis not present

## 2020-08-21 DIAGNOSIS — F32A Depression, unspecified: Secondary | ICD-10-CM | POA: Diagnosis present

## 2020-08-21 DIAGNOSIS — W19XXXA Unspecified fall, initial encounter: Secondary | ICD-10-CM | POA: Diagnosis not present

## 2020-08-21 DIAGNOSIS — Z853 Personal history of malignant neoplasm of breast: Secondary | ICD-10-CM

## 2020-08-21 DIAGNOSIS — Z23 Encounter for immunization: Secondary | ICD-10-CM

## 2020-08-21 DIAGNOSIS — I825Y9 Chronic embolism and thrombosis of unspecified deep veins of unspecified proximal lower extremity: Secondary | ICD-10-CM | POA: Diagnosis not present

## 2020-08-21 DIAGNOSIS — K219 Gastro-esophageal reflux disease without esophagitis: Secondary | ICD-10-CM | POA: Diagnosis present

## 2020-08-21 DIAGNOSIS — G9341 Metabolic encephalopathy: Secondary | ICD-10-CM | POA: Diagnosis present

## 2020-08-21 DIAGNOSIS — S2231XA Fracture of one rib, right side, initial encounter for closed fracture: Secondary | ICD-10-CM | POA: Diagnosis present

## 2020-08-21 DIAGNOSIS — Z79899 Other long term (current) drug therapy: Secondary | ICD-10-CM

## 2020-08-21 DIAGNOSIS — Z800 Family history of malignant neoplasm of digestive organs: Secondary | ICD-10-CM

## 2020-08-21 DIAGNOSIS — Z806 Family history of leukemia: Secondary | ICD-10-CM

## 2020-08-21 DIAGNOSIS — R6521 Severe sepsis with septic shock: Secondary | ICD-10-CM | POA: Diagnosis present

## 2020-08-21 DIAGNOSIS — S32110A Nondisplaced Zone I fracture of sacrum, initial encounter for closed fracture: Secondary | ICD-10-CM | POA: Diagnosis present

## 2020-08-21 DIAGNOSIS — I82409 Acute embolism and thrombosis of unspecified deep veins of unspecified lower extremity: Secondary | ICD-10-CM

## 2020-08-21 DIAGNOSIS — I361 Nonrheumatic tricuspid (valve) insufficiency: Secondary | ICD-10-CM | POA: Diagnosis not present

## 2020-08-21 LAB — URINALYSIS, COMPLETE (UACMP) WITH MICROSCOPIC
Bilirubin Urine: NEGATIVE
Glucose, UA: 50 mg/dL — AB
Hgb urine dipstick: NEGATIVE
Ketones, ur: 20 mg/dL — AB
Nitrite: NEGATIVE
Protein, ur: 100 mg/dL — AB
Specific Gravity, Urine: 1.015 (ref 1.005–1.030)
pH: 5 (ref 5.0–8.0)

## 2020-08-21 LAB — COMPREHENSIVE METABOLIC PANEL
ALT: 21 U/L (ref 0–44)
AST: 29 U/L (ref 15–41)
Albumin: 3.9 g/dL (ref 3.5–5.0)
Alkaline Phosphatase: 68 U/L (ref 38–126)
Anion gap: 14 (ref 5–15)
BUN: 17 mg/dL (ref 8–23)
CO2: 19 mmol/L — ABNORMAL LOW (ref 22–32)
Calcium: 8.7 mg/dL — ABNORMAL LOW (ref 8.9–10.3)
Chloride: 105 mmol/L (ref 98–111)
Creatinine, Ser: 0.9 mg/dL (ref 0.44–1.00)
GFR, Estimated: 58 mL/min — ABNORMAL LOW (ref >60)
Glucose, Bld: 279 mg/dL — ABNORMAL HIGH (ref 70–99)
Potassium: 3.4 mmol/L — ABNORMAL LOW (ref 3.5–5.1)
Sodium: 138 mmol/L (ref 135–145)
Total Bilirubin: 1.6 mg/dL — ABNORMAL HIGH (ref 0.3–1.2)
Total Protein: 7.3 g/dL (ref 6.5–8.1)

## 2020-08-21 LAB — CBC WITH DIFFERENTIAL/PLATELET
Abs Immature Granulocytes: 0.35 10*3/uL — ABNORMAL HIGH (ref 0.00–0.07)
Basophils Absolute: 0.1 10*3/uL (ref 0.0–0.1)
Basophils Relative: 0 %
Eosinophils Absolute: 0 10*3/uL (ref 0.0–0.5)
Eosinophils Relative: 0 %
HCT: 43 % (ref 36.0–46.0)
Hemoglobin: 14 g/dL (ref 12.0–15.0)
Immature Granulocytes: 1 %
Lymphocytes Relative: 7 %
Lymphs Abs: 1.9 10*3/uL (ref 0.7–4.0)
MCH: 31.2 pg (ref 26.0–34.0)
MCHC: 32.6 g/dL (ref 30.0–36.0)
MCV: 95.8 fL (ref 80.0–100.0)
Monocytes Absolute: 1.8 10*3/uL — ABNORMAL HIGH (ref 0.1–1.0)
Monocytes Relative: 6 %
Neutro Abs: 24 10*3/uL — ABNORMAL HIGH (ref 1.7–7.7)
Neutrophils Relative %: 86 %
Platelets: 300 10*3/uL (ref 150–400)
RBC: 4.49 MIL/uL (ref 3.87–5.11)
RDW: 13.5 % (ref 11.5–15.5)
Smear Review: NORMAL
WBC: 28.1 10*3/uL — ABNORMAL HIGH (ref 4.0–10.5)
nRBC: 0 % (ref 0.0–0.2)

## 2020-08-21 LAB — LIPASE, BLOOD: Lipase: 27 U/L (ref 11–51)

## 2020-08-21 LAB — GLUCOSE, CAPILLARY
Glucose-Capillary: 167 mg/dL — ABNORMAL HIGH (ref 70–99)
Glucose-Capillary: 174 mg/dL — ABNORMAL HIGH (ref 70–99)

## 2020-08-21 LAB — BRAIN NATRIURETIC PEPTIDE: B Natriuretic Peptide: 320 pg/mL — ABNORMAL HIGH (ref 0.0–100.0)

## 2020-08-21 LAB — LACTIC ACID, PLASMA
Lactic Acid, Venous: 6.4 mmol/L (ref 0.5–1.9)
Lactic Acid, Venous: >11 mmol/L (ref 0.5–1.9)
Lactic Acid, Venous: 4.5 mmol/L (ref 0.5–1.9)
Lactic Acid, Venous: 3.4 mmol/L (ref 0.5–1.9)

## 2020-08-21 LAB — HEMOGLOBIN A1C
Hgb A1c MFr Bld: 6.4 % — ABNORMAL HIGH (ref 4.8–5.6)
Mean Plasma Glucose: 136.98 mg/dL

## 2020-08-21 LAB — PROCALCITONIN: Procalcitonin: <0.1 ng/mL

## 2020-08-21 LAB — APTT: aPTT: 34 seconds (ref 24–36)

## 2020-08-21 LAB — MAGNESIUM: Magnesium: 2.2 mg/dL (ref 1.7–2.4)

## 2020-08-21 LAB — RESP PANEL BY RT-PCR (FLU A&B, COVID) ARPGX2
Influenza A by PCR: NEGATIVE
Influenza B by PCR: NEGATIVE
SARS Coronavirus 2 by RT PCR: NEGATIVE

## 2020-08-21 LAB — PROTIME-INR
INR: 1.3 — ABNORMAL HIGH (ref 0.8–1.2)
Prothrombin Time: 15.9 seconds — ABNORMAL HIGH (ref 11.4–15.2)

## 2020-08-21 LAB — CK: Total CK: 148 U/L (ref 38–234)

## 2020-08-21 MED ORDER — ACETAMINOPHEN 650 MG RE SUPP
650.0000 mg | Freq: Four times a day (QID) | RECTAL | Status: DC | PRN
  Filled 2020-08-21: qty 1

## 2020-08-21 MED ORDER — VITAMIN B-12 1000 MCG PO TABS
1000.0000 ug | ORAL_TABLET | Freq: Every day | ORAL | Status: DC
  Administered 2020-08-22 – 2020-08-30 (×9): 1000 ug via ORAL
  Filled 2020-08-21 (×9): qty 1

## 2020-08-21 MED ORDER — LACTATED RINGERS IV BOLUS
500.0000 mL | Freq: Once | INTRAVENOUS | Status: AC
  Administered 2020-08-21: 500 mL via INTRAVENOUS

## 2020-08-21 MED ORDER — LACTATED RINGERS IV SOLN
INTRAVENOUS | Status: AC
  Administered 2020-08-21: 100 mL/h via INTRAVENOUS

## 2020-08-21 MED ORDER — LACTATED RINGERS IV BOLUS (SEPSIS)
1000.0000 mL | Freq: Once | INTRAVENOUS | Status: AC
  Administered 2020-08-21: 1000 mL via INTRAVENOUS

## 2020-08-21 MED ORDER — METOPROLOL SUCCINATE ER 25 MG PO TB24
25.0000 mg | ORAL_TABLET | Freq: Every day | ORAL | Status: DC
  Administered 2020-08-22 – 2020-08-25 (×4): 25 mg via ORAL
  Filled 2020-08-21 (×4): qty 1

## 2020-08-21 MED ORDER — MELATONIN 5 MG PO TABS
5.0000 mg | ORAL_TABLET | Freq: Every evening | ORAL | Status: DC | PRN
  Administered 2020-08-21: 5 mg via ORAL
  Filled 2020-08-21: qty 1

## 2020-08-21 MED ORDER — PANTOPRAZOLE SODIUM 40 MG PO TBEC
40.0000 mg | DELAYED_RELEASE_TABLET | Freq: Every day | ORAL | Status: DC
  Administered 2020-08-22 – 2020-08-30 (×9): 40 mg via ORAL
  Filled 2020-08-21 (×9): qty 1

## 2020-08-21 MED ORDER — ACETAMINOPHEN 500 MG PO TABS
1000.0000 mg | ORAL_TABLET | Freq: Once | ORAL | Status: AC
  Administered 2020-08-21: 1000 mg via ORAL
  Filled 2020-08-21: qty 2

## 2020-08-21 MED ORDER — PNEUMOCOCCAL VAC POLYVALENT 25 MCG/0.5ML IJ INJ
0.5000 mL | INJECTION | INTRAMUSCULAR | Status: DC
  Filled 2020-08-21 (×2): qty 0.5

## 2020-08-21 MED ORDER — POTASSIUM CHLORIDE CRYS ER 20 MEQ PO TBCR
40.0000 meq | EXTENDED_RELEASE_TABLET | Freq: Once | ORAL | Status: AC
  Administered 2020-08-21: 40 meq via ORAL
  Filled 2020-08-21: qty 2

## 2020-08-21 MED ORDER — INSULIN ASPART 100 UNIT/ML ~~LOC~~ SOLN
0.0000 [IU] | Freq: Every day | SUBCUTANEOUS | Status: DC
  Filled 2020-08-21: qty 1

## 2020-08-21 MED ORDER — HALOPERIDOL LACTATE 5 MG/ML IJ SOLN
1.0000 mg | Freq: Once | INTRAMUSCULAR | Status: AC
  Administered 2020-08-21: 1 mg via INTRAVENOUS
  Filled 2020-08-21: qty 1

## 2020-08-21 MED ORDER — LOPERAMIDE HCL 2 MG PO CAPS: 2.0000 mg | ORAL_CAPSULE | Freq: Two times a day (BID) | ORAL | Status: DC | PRN

## 2020-08-21 MED ORDER — SODIUM CHLORIDE 0.9 % IV SOLN
2.0000 g | Freq: Once | INTRAVENOUS | Status: AC
  Administered 2020-08-21: 2 g via INTRAVENOUS
  Filled 2020-08-21: qty 2

## 2020-08-21 MED ORDER — PROBIOTIC COLIC PO LIQD: Freq: Every day | ORAL | Status: DC

## 2020-08-21 MED ORDER — CRANBERRY 500 MG PO CAPS: 1.0000 | ORAL_CAPSULE | Freq: Every day | ORAL | Status: DC

## 2020-08-21 MED ORDER — OXYCODONE-ACETAMINOPHEN 5-325 MG PO TABS
1.0000 | ORAL_TABLET | ORAL | Status: DC | PRN
  Administered 2020-08-21: 1 via ORAL
  Filled 2020-08-21: qty 1

## 2020-08-21 MED ORDER — HYDRALAZINE HCL 20 MG/ML IJ SOLN
5.0000 mg | INTRAMUSCULAR | Status: DC | PRN
  Administered 2020-08-21 – 2020-08-22 (×3): 5 mg via INTRAVENOUS
  Filled 2020-08-21 (×3): qty 1

## 2020-08-21 MED ORDER — DULOXETINE HCL 30 MG PO CPEP
30.0000 mg | ORAL_CAPSULE | Freq: Every day | ORAL | Status: DC
  Administered 2020-08-22 – 2020-08-30 (×9): 30 mg via ORAL
  Filled 2020-08-21 (×9): qty 1

## 2020-08-21 MED ORDER — METHOCARBAMOL 500 MG PO TABS
500.0000 mg | ORAL_TABLET | Freq: Three times a day (TID) | ORAL | Status: DC | PRN
  Administered 2020-08-26 (×2): 500 mg via ORAL
  Filled 2020-08-21 (×3): qty 1

## 2020-08-21 MED ORDER — VITAMIN D 25 MCG (1000 UNIT) PO TABS
2000.0000 [IU] | ORAL_TABLET | Freq: Every day | ORAL | Status: DC
  Administered 2020-08-22 – 2020-08-30 (×9): 2000 [IU] via ORAL
  Filled 2020-08-21 (×9): qty 2

## 2020-08-21 MED ORDER — INSULIN ASPART 100 UNIT/ML ~~LOC~~ SOLN
0.0000 [IU] | Freq: Three times a day (TID) | SUBCUTANEOUS | Status: DC
  Administered 2020-08-21: 2 [IU] via SUBCUTANEOUS
  Administered 2020-08-22: 1 [IU] via SUBCUTANEOUS
  Administered 2020-08-22 – 2020-08-23 (×3): 2 [IU] via SUBCUTANEOUS
  Administered 2020-08-23: 1 [IU] via SUBCUTANEOUS
  Administered 2020-08-23 – 2020-08-24 (×2): 2 [IU] via SUBCUTANEOUS
  Administered 2020-08-24: 1 [IU] via SUBCUTANEOUS
  Administered 2020-08-25: 2 [IU] via SUBCUTANEOUS
  Administered 2020-08-25 – 2020-08-27 (×4): 1 [IU] via SUBCUTANEOUS
  Administered 2020-08-27 – 2020-08-29 (×3): 2 [IU] via SUBCUTANEOUS
  Administered 2020-08-29 (×2): 1 [IU] via SUBCUTANEOUS
  Administered 2020-08-30: 2 [IU] via SUBCUTANEOUS
  Administered 2020-08-30: 1 [IU] via SUBCUTANEOUS
  Filled 2020-08-21 (×19): qty 1

## 2020-08-21 MED ORDER — SODIUM CHLORIDE 0.9 % IV SOLN
1.0000 g | Freq: Two times a day (BID) | INTRAVENOUS | Status: DC
  Administered 2020-08-21 – 2020-08-22 (×3): 1 g via INTRAVENOUS
  Filled 2020-08-21 (×5): qty 1

## 2020-08-21 MED ORDER — ASCORBIC ACID 500 MG PO TABS
1000.0000 mg | ORAL_TABLET | Freq: Every day | ORAL | Status: DC
  Administered 2020-08-22 – 2020-08-30 (×9): 1000 mg via ORAL
  Filled 2020-08-21 (×9): qty 2

## 2020-08-21 MED ORDER — HYDROXYZINE HCL 50 MG/ML IM SOLN
25.0000 mg | Freq: Four times a day (QID) | INTRAMUSCULAR | Status: DC | PRN
  Filled 2020-08-21: qty 0.5

## 2020-08-21 MED ORDER — MORPHINE SULFATE (PF) 2 MG/ML IV SOLN: 0.5000 mg | INTRAVENOUS | Status: DC | PRN

## 2020-08-21 MED ORDER — SODIUM CHLORIDE 0.9 % IV SOLN
2.0000 g | Freq: Once | INTRAVENOUS | Status: AC
  Administered 2020-08-21: 2 g via INTRAVENOUS
  Filled 2020-08-21: qty 20

## 2020-08-21 NOTE — Consult Note (Signed)
Pharmacy Antibiotic Note  [name] is a 87 y.o. female admitted on 08/21/2020 with sepsis. Pt presented with cold exposure and possible fall. PMH includes HTN, breast cancer s/p L mastectomy, GERD, and colon cancer. Pt recently completed 10 days of Amoxicillin for UTI (about a week ago). Pharmacy has been consulted for meropenem dosing.  LA 6.4> >11, WBC 28.1, temp 95.7 on admission  Day 1 abx  Plan: Meropenem 1g q12h  Monitor renal function  Height: 5\' 3"  (160 cm) Weight: 65.8 kg (145 lb) IBW/kg (Calculated) : 52.4  Temp (24hrs), Avg:97.2 F (36.2 C), Min:95.7 F (35.4 C), Max:98.7 F (37.1 C)  Recent Labs  Lab 08/21/20 0738 08/21/20 1013  WBC 28.1*  --   CREATININE 0.90  --   LATICACIDVEN 6.4* >11.0*    Estimated Creatinine Clearance: 32.6 mL/min (by C-G formula based on SCr of 0.9 mg/dL).    Allergies  Allergen Reactions  . Codeine Nausea And Vomiting    Antimicrobials this admission: 3/26 meropenem >> 3/26 cefepime x 1 dose 3/26 ceftriaxone x 1 dose  Microbiology results: 3/26 BCx: pending 3/26 UCx: pending   Thank you for allowing pharmacy to be a part of this patient's care.  Benn Moulder, PharmD Pharmacy Resident  08/21/2020 11:46 AM

## 2020-08-21 NOTE — ED Notes (Addendum)
Pt is hallucinating and pulling at [place]. Pt is not redirectable at this time. Pt is a/o x1 to self. Pt is continually yelling for her sister to come through the door and asking why this RN will not allow her sister in the room. Her sister is not present at the hospital.

## 2020-08-21 NOTE — ED Triage Notes (Signed)
Pt arrives to ER via [facility] from home. Fell in driveway, unknown downtime. 88 oral. CBG 394. EMS reports AMS. Pt talking in clear complete sentences upon arrival. Cold to touch.

## 2020-08-21 NOTE — H&P (Addendum)
History and Physical    [name] OLM:[id number] DOB: Dec 22, 1920 DOA: 08/21/2020  Referring MD/NP/PA:   PCP: [name], MD   Patient coming from:  The patient is coming from home.  At baseline, pt is partially dependent for most of ADL.        Chief Complaint: fall, AMS and increased urinary frequency  HPI: [name] is a 87 y.o. female with medical history significant of hypertension, GERD, depression, stomach ulcer, GI bleeding, breast cancer (s/p of left mastectomy), remote colon cancer (s/p for colectomy), arthritis, recurrent UTI, who presents with fall, altered mental status and increased urinary frequency.  Per his son at bedside, patient has been confused since yesterday.  Normally patient is oriented x3.  Today she is not oriented to time, but still recognize her son and knows that she is in hospital currently. Pt has hallucination. No facial droop or slurred speech.  She moves all extremities normally. Per her son, patient was found outside on driveway and was cold to touch today. She complains of left hip pain. Patient does not seem to have chest pain, cough, shortness of breath.  No fever or chills.  Patient has chronic intermittent mild diarrhea which has not changed.  No nausea, vomiting or abdominal pain.  Patient has increased urinary frequency.  Recently she just finished a course of antibiotics for UTI a week ago.    ED Course: pt was found to have WBC 28.1, lactic acid 6.4, 11.0, INR 1.3, urinalysis (hazy appearance, trace amount of leukocyte, many bacteria, WBC 11-20), negative COVID PCR, potassium 3.4, creatinine 0.90, BUN 19, temperature 98.7, blood pressure 168/68, tachycardia with heart rate 115, tachypnea with RR 30.  Oxygen saturation 81%, then improved to 96% on room air.  Chest x-ray showed cardiomegaly and possible small right pleural effusion.  CT of the head and CT of C-spine is negative for acute issues.   CT-abd/pelvis: Acute nondisplaced  fractures involving the anterior column of the left acetabulum, left parasymphyseal region, and right superior pubic ramus. Acute mildly displaced fracture involving the left inferior pubic ramus. Associated mild extraperitoneal hemorrhage anterior to the bladder and in the left anterior pelvis. No free air.  Nondisplaced bilateral sacral ala fractures, also likely acute.  Moderate compression fracture deformity at L4, age indeterminate but likely chronic.  Suspected nondisplaced right anterolateral 7th rib fracture, incompletely visualized and age indeterminate.   X-ray of pelvis:  Bilateral hips are intact.  Suspected nondisplaced right pelvic ring fractures, possibly acute.  Suspected left pelvic ring fractures, possibly subacute/chronic.  Review of Systems: Could not review accurately due to altered mental status.  Allergy:  Allergies  Allergen Reactions  . Codeine Nausea And Vomiting    Past Medical History:  Diagnosis Date  . Allergy   . Arthritis    back  . Benign neoplasm of skin, site unspecified   . Bowel trouble   . Breast cancer ([place])   . Cancer ([place])    colon 20 years ago  . Cancer of breast [facility]) April 10,.2013   Left breast, 3.8 cm, 4 cm axillary node, T2, N1a, ER negative PR negative, HER-2/neu not amplified.  . Cataract   . Colon cancer ([place])   . Hard of hearing   . Heartburn   . Hiatal hernia   . History of stomach ulcers   . History of stomach ulcers   . Hypertension 2012  . Personal history of malignant neoplasm of breast 2013   LEFT MASTECTOMY,left  modified radical mastectomy on September 06, 2011 483.8 cm primary tumor with a 4.6 cm axillary metastasis. 3 of 13 nodes were positive. T2,N1a lesion. This was a triple negative lesion  . Shingles Dec 2015  . Vaginal atrophy 08/10/2015  . Wrist fracture, right     Past Surgical History:  Procedure Laterality Date  . ABDOMINAL HYSTERECTOMY     42 YEARS AGO  . BREAST SURGERY Left 09-06-11    left modified radical mastectomy on September 06, 2011 483.8 cm primary tumor with a 4.6 cm axillary metastasis. 3 of 13 nodes were positive. T2,N1a lesion. This was a triple negative lesion  . CARPAL TUNNEL RELEASE Right September 25, 2014   Skip Estimable, M.D.  . COLON RESECTION  1985  . COLON SURGERY  20 YEARS AGO  . COLONOSCOPY  2012   DR. ELLIOTT  . COLONOSCOPY WITH PROPOFOL N/A 08/25/2016   Procedure: COLONOSCOPY WITH PROPOFOL;  Surgeon: [name], MD;  Location: [facility] ENDOSCOPY;  Service: Gastroenterology;  Laterality: N/A;  . ESOPHAGOGASTRODUODENOSCOPY (EGD) WITH PROPOFOL N/A 08/23/2016   Procedure: ESOPHAGOGASTRODUODENOSCOPY (EGD) WITH PROPOFOL;  Surgeon: [name], MD;  Location: [facility] ENDOSCOPY;  Service: Endoscopy;  Laterality: N/A;  . MASTECTOMY Left 2013   left modified radical mastectomy on September 06, 2011 Stage 2; 3.8 cm primary tumor with a 4.6 cm axillary metastasis. 3 of 13 nodes were positive. T2,N1a lesion. This was a triple negative lesion  . UPPER GI ENDOSCOPY  2012   BLEEDING    Social History:  reports that she has never smoked. She has never used smokeless tobacco. She reports that she does not drink alcohol and does not use drugs.  Family History:  Family History  Problem Relation Age of Onset  . Lung cancer Brother        colono ca  . Cancer Brother   . Skin cancer Daughter   . Leukemia Father   . Cancer Father   . Colon cancer Mother   . Cancer Mother   . Skin cancer Son   . Kidney disease Neg Hx   . Bladder Cancer Neg Hx      Prior to Admission medications   Medication Sig Start Date End Date Taking? Authorizing Provider  Ascorbic Acid (VITAMIN C) 1000 MG tablet Take 1,000 mg by mouth daily.   Yes [provider]  Cholecalciferol (VITAMIN D) 50 MCG (2000 UT) CAPS Take 1 capsule by mouth daily.   Yes [provider]  Cranberry 500 MG CAPS Take by mouth daily.   Yes [provider]  DULoxetine (CYMBALTA) 30 MG capsule Take 30 mg by  mouth daily. 07/09/20  Yes [provider]  Lactobacillus Rhamnosus, GG, (PROBIOTIC COLIC PO) Take 1 Dose by mouth daily. doterra brands   Yes [provider]  Melatonin 5 MG CAPS Take 1 capsule by mouth.    Yes [provider]  metoprolol succinate (TOPROL-XL) 25 MG 24 hr tablet Take 25 mg by mouth daily.   Yes [provider]  pantoprazole (PROTONIX) 40 MG tablet TAKE 1 TABLET BY MOUTH ONCE DAILY Patient taking differently: Take 40 mg by mouth daily. 02/22/18  Yes [name], MD  Potassium 99 MG TABS Take 1 tablet by mouth daily.   Yes [provider]  vitamin B-12 (CYANOCOBALAMIN) 1000 MCG tablet Take 1,000 mcg by mouth daily.   Yes [provider]  amoxicillin-clavulanate (AUGMENTIN) 875-125 MG tablet Take 1 tablet by mouth 2 (two) times daily. Patient not taking: No  sig reported 08/04/20   Rubie Maid, MD  loperamide (IMODIUM A-D) 2 MG tablet Take 2 mg by mouth as needed for diarrhea or loose stools.    [provider]    Physical Exam: Vitals:   08/21/20 1115 08/21/20 1130 08/21/20 1315 08/21/20 1330  BP: (!) 152/100 (!) 164/79 (!) 148/87 (!) 167/94  Pulse: 96 95 (!) 106 80  Resp: 19 (!) 24 (!) 36 20  Temp:      TempSrc:      SpO2: 97% 97% 100% 98%  Weight:      Height:       General: Not in acute distress HEENT:       Eyes: PERRL, EOMI, no scleral icterus.       ENT: No discharge from the ears and nose       Neck: No JVD, no bruit, no mass felt. Heme: No neck lymph node enlargement. Cardiac: S1/S2, RRR, No murmurs, No gallops or rubs. Respiratory: No rales, wheezing, rhonchi or rubs. GI: Soft, nondistended, nontender, no organomegaly, BS present. GU: No hematuria Ext: Has trace leg edema bilaterally. 1+DP/PT pulse bilaterally. Musculoskeletal: Has tenderness to the left hip  skin: No rashes.  Neuro: Confused, orientated to the place and person, not to time, cranial nerves II-XII grossly intact, moves all  extremities Psych: Patient is not psychotic, no suicidal or hemocidal ideation.  Labs on Admission: I have personally reviewed following labs and imaging studies  CBC: Recent Labs  Lab 08/21/20 0738  WBC 28.1*  NEUTROABS 24.0*  HGB 14.0  HCT 43.0  MCV 95.8  PLT 096   Basic Metabolic Panel: Recent Labs  Lab 08/21/20 0738  NA 138  K 3.4*  CL 105  CO2 19*  GLUCOSE 279*  BUN 17  CREATININE 0.90  CALCIUM 8.7*  MG 2.2   GFR: Estimated Creatinine Clearance: 32.6 mL/min (by C-G formula based on SCr of 0.9 mg/dL). Liver Function Tests: Recent Labs  Lab 08/21/20 0738  AST 29  ALT 21  ALKPHOS 68  BILITOT 1.6*  PROT 7.3  ALBUMIN 3.9   Recent Labs  Lab 08/21/20 0738  LIPASE 27   No results for input(s): AMMONIA in the last 168 hours. Coagulation Profile: Recent Labs  Lab 08/21/20 0738  INR 1.3*   Cardiac Enzymes: Recent Labs  Lab 08/21/20 0738  CKTOTAL 148   BNP (last 3 results) No results for input(s): PROBNP in the last 8760 hours. HbA1C: No results for input(s): HGBA1C in the last 72 hours. CBG: No results for input(s): GLUCAP in the last 168 hours. Lipid Profile: No results for input(s): CHOL, HDL, LDLCALC, TRIG, CHOLHDL, LDLDIRECT in the last 72 hours. Thyroid Function Tests: No results for input(s): TSH, T4TOTAL, FREET4, T3FREE, THYROIDAB in the last 72 hours. Anemia Panel: No results for input(s): VITAMINB12, FOLATE, FERRITIN, TIBC, IRON, RETICCTPCT in the last 72 hours. Urine analysis:    Component Value Date/Time   COLORURINE YELLOW (A) 08/21/2020 1013   APPEARANCEUR HAZY (A) 08/21/2020 1013   APPEARANCEUR Cloudy (A) 07/19/2015 1055   LABSPEC 1.015 08/21/2020 1013   LABSPEC 1.008 01/06/2012 1922   PHURINE 5.0 08/21/2020 1013   GLUCOSEU 50 (A) 08/21/2020 1013   GLUCOSEU 150 mg/dL 01/06/2012 1922   HGBUR NEGATIVE 08/21/2020 1013   BILIRUBINUR NEGATIVE 08/21/2020 1013   BILIRUBINUR neg 08/04/2020 1446   BILIRUBINUR Negative 07/19/2015  1055   BILIRUBINUR Negative 01/06/2012 1922   KETONESUR 20 (A) 08/21/2020 1013   PROTEINUR 100 (A) 08/21/2020 1013  UROBILINOGEN 0.2 08/04/2020 1446   NITRITE NEGATIVE 08/21/2020 1013   LEUKOCYTESUR TRACE (A) 08/21/2020 1013   LEUKOCYTESUR Trace 01/06/2012 1922   Sepsis Labs: _0 (procalcitonin:4,lacticidven:4) ) Recent Results (from the past 240 hour(s))  Resp Panel by RT-PCR (Flu A&B, Covid) Nasopharyngeal Swab     Status: None   Collection Time: 08/21/20  7:38 AM   Specimen: Nasopharyngeal Swab; Nasopharyngeal(NP) swabs in vial transport medium  Result Value Ref Range Status   SARS Coronavirus 2 by RT PCR NEGATIVE NEGATIVE Final    Comment: (NOTE) SARS-CoV-2 target nucleic acids are NOT DETECTED.  The SARS-CoV-2 RNA is generally detectable in upper respiratory specimens during the acute phase of infection. The lowest concentration of SARS-CoV-2 viral copies this assay can detect is 138 copies/mL. A negative result does not preclude SARS-Cov-2 infection and should not be used as the sole basis for treatment or other patient management decisions. A negative result may occur with  improper specimen collection/handling, submission of specimen other than nasopharyngeal swab, presence of viral mutation(s) within the areas targeted by this assay, and inadequate number of viral copies(<138 copies/mL). A negative result must be combined with clinical observations, patient history, and epidemiological information. The expected result is Negative.  Fact Sheet for Patients:  EntrepreneurPulse.com.au  Fact Sheet for Healthcare Providers:  IncredibleEmployment.be  This test is no t yet approved or cleared by the [place] FDA and  has been authorized for detection and/or diagnosis of SARS-CoV-2 by FDA under an Emergency Use Authorization (EUA). This EUA will remain  in effect (meaning this test can be used) for the duration of  the COVID-19 declaration under Section 564(b)(1) of the Act, 21 U.S.C.section 360bbb-3(b)(1), unless the authorization is terminated  or revoked sooner.       Influenza A by PCR NEGATIVE NEGATIVE Final   Influenza B by PCR NEGATIVE NEGATIVE Final    Comment: (NOTE) The Xpert Xpress SARS-CoV-2/FLU/RSV plus assay is intended as an aid in the diagnosis of influenza from Nasopharyngeal swab specimens and should not be used as a sole basis for treatment. Nasal washings and aspirates are unacceptable for Xpert Xpress SARS-CoV-2/FLU/RSV testing.  Fact Sheet for Patients: EntrepreneurPulse.com.au  Fact Sheet for Healthcare Providers: IncredibleEmployment.be  This test is not yet approved or cleared by the [place] FDA and has been authorized for detection and/or diagnosis of SARS-CoV-2 by FDA under an Emergency Use Authorization (EUA). This EUA will remain in effect (meaning this test can be used) for the duration of the COVID-19 declaration under Section 564(b)(1) of the Act, 21 U.S.C. section 360bbb-3(b)(1), unless the authorization is terminated or revoked.  Performed at [facility], [address] [place], [place] [address]      Radiological Exams on Admission: CT ABDOMEN PELVIS WO CONTRAST  Result Date: 08/21/2020 CLINICAL DATA:  Fall, evaluate pelvic fractures on radiograph EXAM: CT ABDOMEN AND PELVIS WITHOUT CONTRAST TECHNIQUE: Multidetector CT imaging of the abdomen and pelvis was performed following the standard protocol without IV contrast. COMPARISON:  Pelvic radiograph dated 08/21/2020 FINDINGS: Motion degraded images. Lower chest: Compressive atelectasis in the medial right lower lobe. Hepatobiliary: 18 mm cyst in the inferior right hepatic lobe (series 2/image 21). Gallbladder is unremarkable. No intrahepatic or extrahepatic ductal dilatation. Pancreas: Within normal limits. Spleen: Within normal limits. Adrenals/Urinary  Tract: Adrenal glands are within normal limits. 4.2 cm cyst in the posterior left upper kidney (series 2/image 24). No renal, ureteral, or bladder calculi. No hydronephrosis. Bladder is within normal limits. Stomach/Bowel: Stomach is notable  for a large hiatal hernia/inverted intrathoracic stomach, incompletely visualized. No evidence of bowel obstruction. Status post right hemicolectomy/ileocecal resection with appendectomy with suture line in the right mid abdomen (series 2/image 34). Vascular/Lymphatic: No evidence of abdominal aortic aneurysm. Atherosclerotic calcifications of the abdominal aorta and branch vessels. No suspicious abdominopelvic lymphadenopathy. Reproductive: Status post hysterectomy.  Vaginal pessary. No adnexal masses. Other: No abdominopelvic ascites. Mild extraperitoneal hemorrhage anterior to the bladder (series 2/image 66) and in the left anterior pelvis (series 2/image 69). No free air. Musculoskeletal: Suspected nondisplaced right anterolateral 7th rib fracture (series 3/image 8), incompletely visualized and age indeterminate. Multiple bilateral pelvic fractures, all of which may be acute, including: --Anterior column of the left acetabulum (series 2/image 69) --Right superior pubic ramus (series 2/image 70) --Left parasymphyseal region (series 2/image 74) --Left inferior pubic ramus, minimally displaced (series 2/image 79) Additionally, there are nondisplaced bilateral sacral ala fractures which are age indeterminate but likely acute given the additional findings (series 2/image 21). No widening of the sacroiliac joints or pubic symphysis. Degenerative changes of the visualized thoracolumbar spine. Moderate superior endplate compression fracture deformity of L4, age indeterminate but likely chronic, without retropulsion. IMPRESSION: Acute nondisplaced fractures involving the anterior column of the left acetabulum, left parasymphyseal region, and right superior pubic ramus. Acute mildly  displaced fracture involving the left inferior pubic ramus. Associated mild extraperitoneal hemorrhage anterior to the bladder and in the left anterior pelvis. No free air. Nondisplaced bilateral sacral ala fractures, also likely acute. Moderate compression fracture deformity at L4, age indeterminate but likely chronic. Suspected nondisplaced right anterolateral 7th rib fracture, incompletely visualized and age indeterminate. Electronically Signed   By: Julian Hy M.D.   On: 08/21/2020 10:10   CT Head Wo Contrast  Result Date: 08/21/2020 CLINICAL DATA:  87 year old female with head and neck injury from fall and mental status change. EXAM: CT HEAD WITHOUT CONTRAST CT CERVICAL SPINE WITHOUT CONTRAST TECHNIQUE: Multidetector CT imaging of the head and cervical spine was performed following the standard protocol without intravenous contrast. Multiplanar CT image reconstructions of the cervical spine were also generated. COMPARISON:  None. FINDINGS: CT HEAD FINDINGS Brain: No evidence of acute infarction, hemorrhage, hydrocephalus, extra-axial collection or mass lesion/mass effect. Atrophy, chronic small-vessel white matter ischemic changes and small remote bilateral cerebellar infarcts are noted. Vascular: Carotid and vertebral atherosclerotic calcifications are noted. Skull: Normal. Negative for fracture or focal lesion. Sinuses/Orbits: No acute finding. Other: None. CT CERVICAL SPINE FINDINGS Alignment: Normal. Skull base and vertebrae: No acute fracture. No primary bone lesion or focal pathologic process. Soft tissues and spinal canal: No prevertebral fluid or swelling. No visible canal hematoma. Disc levels: Mild to moderate degenerative disc disease/spondylosis from C4-C7 noted. Mild to moderate multilevel facet arthropathy is identified. Upper chest: No acute abnormality Other: None IMPRESSION: 1. No evidence of acute intracranial abnormality. Atrophy, chronic small-vessel white matter ischemic changes  and small remote bilateral cerebellar infarcts. 2. No static evidence of acute injury to the cervical spine. Electronically Signed   By: Margarette Canada M.D.   On: 08/21/2020 08:28   CT Cervical Spine Wo Contrast  Result Date: 08/21/2020 CLINICAL DATA:  87 year old female with head and neck injury from fall and mental status change. EXAM: CT HEAD WITHOUT CONTRAST CT CERVICAL SPINE WITHOUT CONTRAST TECHNIQUE: Multidetector CT imaging of the head and cervical spine was performed following the standard protocol without intravenous contrast. Multiplanar CT image reconstructions of the cervical spine were also generated. COMPARISON:  None. FINDINGS: CT HEAD FINDINGS Brain: No  evidence of acute infarction, hemorrhage, hydrocephalus, extra-axial collection or mass lesion/mass effect. Atrophy, chronic small-vessel white matter ischemic changes and small remote bilateral cerebellar infarcts are noted. Vascular: Carotid and vertebral atherosclerotic calcifications are noted. Skull: Normal. Negative for fracture or focal lesion. Sinuses/Orbits: No acute finding. Other: None. CT CERVICAL SPINE FINDINGS Alignment: Normal. Skull base and vertebrae: No acute fracture. No primary bone lesion or focal pathologic process. Soft tissues and spinal canal: No prevertebral fluid or swelling. No visible canal hematoma. Disc levels: Mild to moderate degenerative disc disease/spondylosis from C4-C7 noted. Mild to moderate multilevel facet arthropathy is identified. Upper chest: No acute abnormality Other: None IMPRESSION: 1. No evidence of acute intracranial abnormality. Atrophy, chronic small-vessel white matter ischemic changes and small remote bilateral cerebellar infarcts. 2. No static evidence of acute injury to the cervical spine. Electronically Signed   By: Margarette Canada M.D.   On: 08/21/2020 08:28   DG Pelvis Portable  Result Date: 08/21/2020 CLINICAL DATA:  Fall, evaluate for rib fracture EXAM: PORTABLE PELVIS 1-2 VIEWS  COMPARISON:  None. FINDINGS: Bilateral hips are intact. Bilateral hip joint spaces are preserved. Suspected nondisplaced right pelvic ring fractures, possibly acute. Suspected nondisplaced left superior pubic ramus/parasymphyseal fracture, possibly subacute/chronic. Old left inferior pubic ramus fracture is suspected. Degenerative changes of the lower lumbar spine. IMPRESSION: Bilateral hips are intact. Suspected nondisplaced right pelvic ring fractures, possibly acute. Suspected left pelvic ring fractures, possibly subacute/chronic. Electronically Signed   By: Julian Hy M.D.   On: 08/21/2020 08:58   DG Chest Port 1 View  Result Date: 08/21/2020 CLINICAL DATA:  Found down EXAM: PORTABLE CHEST 1 VIEW COMPARISON:  08/21/2016 FINDINGS: Increased interstitial markings. No focal consolidation. Possible small right pleural effusion. No pneumothorax. Cardiomegaly. Large hiatal hernia. IMPRESSION: Cardiomegaly. Possible small right pleural effusion. Large hiatal hernia. Electronically Signed   By: Julian Hy M.D.   On: 08/21/2020 08:56     EKG: I have personally reviewed.  Sinus rhythm, QTC 510, poor quality of EKG strips, right bundle blockage, early R wave progression, occasional PVC  Assessment/Plan Principal Problem:   Severe sepsis with septic shock (HCC) Active Problems:   Hypertension   GERD (gastroesophageal reflux disease)   Recurrent UTI   Depression   Acute metabolic encephalopathy   Fall at home, initial encounter   Pelvic ring fracture (HCC)   Hypokalemia   Hyperglycemia   Severe sepsis with septic shock due to UTI: Patient meets criteria for severe sepsis with septic shock with leukocytosis with WBC 28.1, tachycardia with heart rate 115, tachypnea with RR 32, lactic acid is elevated 6.4, 11.0.  Currently hemodynamically stable.  -Admitted to progressive bed as inpatient -Meropenem (patient received 1 dose of Rocephin and cefepime in ED) -Blood culture and urine  culture -will get Procalcitonin and trend lactic acid levels per sepsis protocol. -IVF: 2.5 LR bolus, followed by 100 cc/h   Hypertension: Currently blood pressure 168/68 -Metoprolol -IV hydralazine as needed  GERD (gastroesophageal reflux disease) -Protonix  Depression -Continue home medication  Acute metabolic encephalopathy: Likely due to UTI and sepsis.  CT head negative for acute intracranial abnormalities -Frequent neuro check -Treat UTI as above  Fall at home, initial encounter -PT/OT  Pelvic ring fracture ([place]): Consulted Dr. Mack Guise of Ortho -As needed Percocet, morphine and Robaxin -Consulted  Hypokalemia: Potassium is 3.4 -Repleted potassium -Check magnesium level  Hyperglycemia: Blood sugar 279 by BMP.  No history of diabetes. -Check A1c -Sliding scale insulin       DVT ppx:  SCD Code Status: DNR per her son Family Communication:  Yes, patient's son at bed side Disposition Plan:  Anticipate discharge back to previous environment Consults called:  Dr. Mack Guise of Ortho Admission status and Level of care: Progressive Cardiac:  as inpt    Status is: Inpatient  Remains inpatient appropriate because:Inpatient level of care appropriate due to severity of illness   Dispo: The patient is from: Home              Anticipated d/c is to: Home              Patient currently is not medically stable to d/c.   Difficult to place patient No           Date of Service 08/21/2020    [facility]   If 7PM-7AM, please contact night-coverage [email or web address] 08/21/2020, 2:36 PM

## 2020-08-21 NOTE — Consult Note (Signed)
ORTHOPAEDIC CONSULTATION  REQUESTING PHYSICIAN: Ivor Costa, MD  Chief Complaint: Pelvic fractures  HPI: [name] is a 87 y.o. female admitted with near sepsis due to UTI.  She has a white count of 28.1.  Patient is confused and is unable to provide a history.  On work-up in the ED patient the x-rays of the pelvis as well as a CT of the abdomen and pelvis due to a history of falling prior to arrival.  Orthopedics is consulted for the pelvic fractures seen on these radiographic studies.  Past Medical History:  Diagnosis Date  . Allergy   . Arthritis    back  . Benign neoplasm of skin, site unspecified   . Bowel trouble   . Breast cancer ([place])   . Cancer ([place])    colon 20 years ago  . Cancer of breast [facility]) April 10,.2013   Left breast, 3.8 cm, 4 cm axillary node, T2, N1a, ER negative PR negative, HER-2/neu not amplified.  . Cataract   . Colon cancer ([place])   . Hard of hearing   . Heartburn   . Hiatal hernia   . History of stomach ulcers   . History of stomach ulcers   . Hypertension 2012  . Personal history of malignant neoplasm of breast 2013   LEFT MASTECTOMY,left modified radical mastectomy on September 06, 2011 483.8 cm primary tumor with a 4.6 cm axillary metastasis. 3 of 13 nodes were positive. T2,N1a lesion. This was a triple negative lesion  . Shingles Dec 2015  . Vaginal atrophy 08/10/2015  . Wrist fracture, right    Past Surgical History:  Procedure Laterality Date  . ABDOMINAL HYSTERECTOMY     42 YEARS AGO  . BREAST SURGERY Left 09-06-11   left modified radical mastectomy on September 06, 2011 483.8 cm primary tumor with a 4.6 cm axillary metastasis. 3 of 13 nodes were positive. T2,N1a lesion. This was a triple negative lesion  . CARPAL TUNNEL RELEASE Right September 25, 2014   Skip Estimable, M.D.  . COLON RESECTION  1985  . COLON SURGERY  20 YEARS AGO  . COLONOSCOPY  2012   DR. ELLIOTT  . COLONOSCOPY WITH PROPOFOL N/A 08/25/2016   Procedure: COLONOSCOPY WITH PROPOFOL;   Surgeon: [name], MD;  Location[facility] ENDOSCOPY;  Service: Gastroenterology;  Laterality: N/A;  . ESOPHAGOGASTRODUODENOSCOPY (EGD) WITH PROPOFOL N/A 08/23/2016   Procedure: ESOPHAGOGASTRODUODENOSCOPY (EGD) WITH PROPOFOL;  Surgeon: [name], MD;  Location: [facility] ENDOSCOPY;  Service: Endoscopy;  Laterality: N/A;  . MASTECTOMY Left 2013   left modified radical mastectomy on September 06, 2011 Stage 2; 3.8 cm primary tumor with a 4.6 cm axillary metastasis. 3 of 13 nodes were positive. T2,N1a lesion. This was a triple negative lesion  . UPPER GI ENDOSCOPY  2012   BLEEDING   Social History   Socioeconomic History  . Marital status: Widowed    Spouse name: Not on file  . Number of children: Not on file  . Years of education: Not on file  . Highest education level: Not on file  Occupational History  . Not on file  Tobacco Use  . Smoking status: Never Smoker  . Smokeless tobacco: Never Used  Vaping Use  . Vaping Use: Never used  Substance and Sexual Activity  . Alcohol use: No  . Drug use: No  . Sexual activity: Never    Birth control/protection: None  Other Topics Concern  . Not on file  Social History Narrative  . Not on  file   Social Determinants of Health   Financial Resource Strain: Not on file  Food Insecurity: Not on file  Transportation Needs: Not on file  Physical Activity: Not on file  Stress: Not on file  Social Connections: Not on file   Family History  Problem Relation Age of Onset  . Lung cancer Brother        colono ca  . Cancer Brother   . Skin cancer Daughter   . Leukemia Father   . Cancer Father   . Colon cancer Mother   . Cancer Mother   . Skin cancer Son   . Kidney disease Neg Hx   . Bladder Cancer Neg Hx    Allergies  Allergen Reactions  . Codeine Nausea And Vomiting   Prior to Admission medications   Medication Sig Start Date End Date Taking? Authorizing Provider  Ascorbic Acid (VITAMIN C) 1000 MG tablet Take 1,000 mg by mouth daily.   Yes  [provider]  Cholecalciferol (VITAMIN D) 50 MCG (2000 UT) CAPS Take 1 capsule by mouth daily.   Yes [provider]  Cranberry 500 MG CAPS Take by mouth daily.   Yes [provider]  DULoxetine (CYMBALTA) 30 MG capsule Take 30 mg by mouth daily. 07/09/20  Yes [provider]  Lactobacillus Rhamnosus, GG, (PROBIOTIC COLIC PO) Take 1 Dose by mouth daily. doterra brands   Yes [provider]  Melatonin 5 MG CAPS Take 1 capsule by mouth.    Yes [provider]  metoprolol succinate (TOPROL-XL) 25 MG 24 hr tablet Take 25 mg by mouth daily.   Yes [provider]  pantoprazole (PROTONIX) 40 MG tablet TAKE 1 TABLET BY MOUTH ONCE DAILY Patient taking differently: Take 40 mg by mouth daily. 02/22/18  Yes [name], MD  Potassium 99 MG TABS Take 1 tablet by mouth daily.   Yes [provider]  vitamin B-12 (CYANOCOBALAMIN) 1000 MCG tablet Take 1,000 mcg by mouth daily.   Yes [provider]  amoxicillin-clavulanate (AUGMENTIN) 875-125 MG tablet Take 1 tablet by mouth 2 (two) times daily. Patient not taking: No sig reported 08/04/20   Rubie Maid, MD  loperamide (IMODIUM A-D) 2 MG tablet Take 2 mg by mouth as needed for diarrhea or loose stools.    [provider]   CT ABDOMEN PELVIS WO CONTRAST  Result Date: 08/21/2020 CLINICAL DATA:  Fall, evaluate pelvic fractures on radiograph EXAM: CT ABDOMEN AND PELVIS WITHOUT CONTRAST TECHNIQUE: Multidetector CT imaging of the abdomen and pelvis was performed following the standard protocol without IV contrast. COMPARISON:  Pelvic radiograph dated 08/21/2020 FINDINGS: Motion degraded images. Lower chest: Compressive atelectasis in the medial right lower lobe. Hepatobiliary: 18 mm cyst in the inferior right hepatic lobe (series 2/image 21). Gallbladder is unremarkable. No intrahepatic or extrahepatic ductal dilatation. Pancreas: Within normal limits. Spleen: Within normal limits.  Adrenals/Urinary Tract: Adrenal glands are within normal limits. 4.2 cm cyst in the posterior left upper kidney (series 2/image 24). No renal, ureteral, or bladder calculi. No hydronephrosis. Bladder is within normal limits. Stomach/Bowel: Stomach is notable for a large hiatal hernia/inverted intrathoracic stomach, incompletely visualized. No evidence of bowel obstruction. Status post right hemicolectomy/ileocecal resection with appendectomy with suture line in the right mid abdomen (series 2/image 34). Vascular/Lymphatic: No evidence of abdominal aortic aneurysm. Atherosclerotic calcifications of the abdominal aorta and branch vessels. No suspicious abdominopelvic lymphadenopathy. Reproductive: Status post hysterectomy.  Vaginal pessary. No adnexal masses. Other: No abdominopelvic ascites. Mild extraperitoneal  hemorrhage anterior to the bladder (series 2/image 66) and in the left anterior pelvis (series 2/image 69). No free air. Musculoskeletal: Suspected nondisplaced right anterolateral 7th rib fracture (series 3/image 8), incompletely visualized and age indeterminate. Multiple bilateral pelvic fractures, all of which may be acute, including: --Anterior column of the left acetabulum (series 2/image 69) --Right superior pubic ramus (series 2/image 70) --Left parasymphyseal region (series 2/image 74) --Left inferior pubic ramus, minimally displaced (series 2/image 79) Additionally, there are nondisplaced bilateral sacral ala fractures which are age indeterminate but likely acute given the additional findings (series 2/image 17). No widening of the sacroiliac joints or pubic symphysis. Degenerative changes of the visualized thoracolumbar spine. Moderate superior endplate compression fracture deformity of L4, age indeterminate but likely chronic, without retropulsion. IMPRESSION: Acute nondisplaced fractures involving the anterior column of the left acetabulum, left parasymphyseal region, and right superior pubic  ramus. Acute mildly displaced fracture involving the left inferior pubic ramus. Associated mild extraperitoneal hemorrhage anterior to the bladder and in the left anterior pelvis. No free air. Nondisplaced bilateral sacral ala fractures, also likely acute. Moderate compression fracture deformity at L4, age indeterminate but likely chronic. Suspected nondisplaced right anterolateral 7th rib fracture, incompletely visualized and age indeterminate. Electronically Signed   By: Julian Hy M.D.   On: 08/21/2020 10:10   CT Head Wo Contrast  Result Date: 08/21/2020 CLINICAL DATA:  87 year old female with head and neck injury from fall and mental status change. EXAM: CT HEAD WITHOUT CONTRAST CT CERVICAL SPINE WITHOUT CONTRAST TECHNIQUE: Multidetector CT imaging of the head and cervical spine was performed following the standard protocol without intravenous contrast. Multiplanar CT image reconstructions of the cervical spine were also generated. COMPARISON:  None. FINDINGS: CT HEAD FINDINGS Brain: No evidence of acute infarction, hemorrhage, hydrocephalus, extra-axial collection or mass lesion/mass effect. Atrophy, chronic small-vessel white matter ischemic changes and small remote bilateral cerebellar infarcts are noted. Vascular: Carotid and vertebral atherosclerotic calcifications are noted. Skull: Normal. Negative for fracture or focal lesion. Sinuses/Orbits: No acute finding. Other: None. CT CERVICAL SPINE FINDINGS Alignment: Normal. Skull base and vertebrae: No acute fracture. No primary bone lesion or focal pathologic process. Soft tissues and spinal canal: No prevertebral fluid or swelling. No visible canal hematoma. Disc levels: Mild to moderate degenerative disc disease/spondylosis from C4-C7 noted. Mild to moderate multilevel facet arthropathy is identified. Upper chest: No acute abnormality Other: None IMPRESSION: 1. No evidence of acute intracranial abnormality. Atrophy, chronic small-vessel white  matter ischemic changes and small remote bilateral cerebellar infarcts. 2. No static evidence of acute injury to the cervical spine. Electronically Signed   By: Margarette Canada M.D.   On: 08/21/2020 08:28   CT Cervical Spine Wo Contrast  Result Date: 08/21/2020 CLINICAL DATA:  87 year old female with head and neck injury from fall and mental status change. EXAM: CT HEAD WITHOUT CONTRAST CT CERVICAL SPINE WITHOUT CONTRAST TECHNIQUE: Multidetector CT imaging of the head and cervical spine was performed following the standard protocol without intravenous contrast. Multiplanar CT image reconstructions of the cervical spine were also generated. COMPARISON:  None. FINDINGS: CT HEAD FINDINGS Brain: No evidence of acute infarction, hemorrhage, hydrocephalus, extra-axial collection or mass lesion/mass effect. Atrophy, chronic small-vessel white matter ischemic changes and small remote bilateral cerebellar infarcts are noted. Vascular: Carotid and vertebral atherosclerotic calcifications are noted. Skull: Normal. Negative for fracture or focal lesion. Sinuses/Orbits: No acute finding. Other: None. CT CERVICAL SPINE FINDINGS Alignment: Normal. Skull base and vertebrae: No acute fracture. No primary bone lesion or focal  pathologic process. Soft tissues and spinal canal: No prevertebral fluid or swelling. No visible canal hematoma. Disc levels: Mild to moderate degenerative disc disease/spondylosis from C4-C7 noted. Mild to moderate multilevel facet arthropathy is identified. Upper chest: No acute abnormality Other: None IMPRESSION: 1. No evidence of acute intracranial abnormality. Atrophy, chronic small-vessel white matter ischemic changes and small remote bilateral cerebellar infarcts. 2. No static evidence of acute injury to the cervical spine. Electronically Signed   By: Margarette Canada M.D.   On: 08/21/2020 08:28   DG Pelvis Portable  Result Date: 08/21/2020 CLINICAL DATA:  Fall, evaluate for rib fracture EXAM: PORTABLE  PELVIS 1-2 VIEWS COMPARISON:  None. FINDINGS: Bilateral hips are intact. Bilateral hip joint spaces are preserved. Suspected nondisplaced right pelvic ring fractures, possibly acute. Suspected nondisplaced left superior pubic ramus/parasymphyseal fracture, possibly subacute/chronic. Old left inferior pubic ramus fracture is suspected. Degenerative changes of the lower lumbar spine. IMPRESSION: Bilateral hips are intact. Suspected nondisplaced right pelvic ring fractures, possibly acute. Suspected left pelvic ring fractures, possibly subacute/chronic. Electronically Signed   By: Julian Hy M.D.   On: 08/21/2020 08:58   DG Chest Port 1 View  Result Date: 08/21/2020 CLINICAL DATA:  Found down EXAM: PORTABLE CHEST 1 VIEW COMPARISON:  08/21/2016 FINDINGS: Increased interstitial markings. No focal consolidation. Possible small right pleural effusion. No pneumothorax. Cardiomegaly. Large hiatal hernia. IMPRESSION: Cardiomegaly. Possible small right pleural effusion. Large hiatal hernia. Electronically Signed   By: Julian Hy M.D.   On: 08/21/2020 08:56    Positive ROS: All other systems have been reviewed and were otherwise negative with the exception of those mentioned in the HPI and as above.  Physical Exam: General: Patient confused and cannot follow commands.  MUSCULOSKELETAL: Bilateral hips: Patient skin is intact.  There is no erythema ecchymosis or significant swelling.  Her thigh compartments are soft and compressible.  Distally she has palpable pedal pulses and there is no shortening or rotational deformity to her lower extremities.  Assessment: Nondisplaced fractures of the pelvis   Plan: I reviewed the patient's pelvic plain film and CT scan.  Patient has nondisplaced fractures involving the right superior and inferior pubic rami without significant displacement.  Patient has nondisplaced bilateral sacral ala fractures.  There is no widening of the SI joints or pubic symphysis.   Patient has a chronic appearing L4 compression fracture and a possible nondisplaced fracture of the right seventh rib. None of these injuries will require surgical intervention.  Patient may weight-bear as tolerated using a walker as these fractures should be stable and are unlikely to displace.  Patient would benefit from physical therapy evaluation when medically appropriate.  Patient may follow-up in the orthopedic clinic in 4 weeks after discharge for reevaluation and x-ray.   Thornton Park, MD    08/21/2020 5:32 PM

## 2020-08-21 NOTE — ED Notes (Signed)
Pt clothes removed and placed in gown with multiple warm blankets on pt to warm her up.

## 2020-08-21 NOTE — ED Provider Notes (Signed)
[facility] Emergency Department Provider Note ____________________________________________   Event Date/Time   First MD Initiated Contact with Patient 08/21/20 [id number]     (approximate)  I have reviewed the triage vital signs and the nursing notes.  HISTORY  Chief Complaint Fall and Cold Exposure   HPI [name] is a 87 y.o. femalewho presents to the ED for evaluation of cold exposure and possible fall.   Chart review indicates history of HTN, breast CA s/p left mastectomy, GERD and colon cancer.  Patient presents to the ED via EMS from home due to cold exposure and a possible fall.  She was reportedly found down outside her home in her driveway, cold to the touch and confused.  EMS reports oral temperature of 88 F.   Patient presents confused and unable to provide any relevant history.  She reports diffuse myalgias.  History limited due to her confusion.  We get a rectal temperature of 95 F upon arrival to the ED.  Past Medical History:  Diagnosis Date  . Allergy   . Arthritis    back  . Benign neoplasm of skin, site unspecified   . Bowel trouble   . Breast cancer ([place])   . Cancer ([place])    colon 20 years ago  . Cancer of breast [facility]) April 10,.2013   Left breast, 3.8 cm, 4 cm axillary node, T2, N1a, ER negative PR negative, HER-2/neu not amplified.  . Cataract   . Colon cancer ([place])   . Hard of hearing   . Heartburn   . Hiatal hernia   . History of stomach ulcers   . History of stomach ulcers   . Hypertension 2012  . Personal history of malignant neoplasm of breast 2013   LEFT MASTECTOMY,left modified radical mastectomy on September 06, 2011 483.8 cm primary tumor with a 4.6 cm axillary metastasis. 3 of 13 nodes were positive. T2,N1a lesion. This was a triple negative lesion  . Shingles Dec 2015  . Vaginal atrophy 08/10/2015  . Wrist fracture, right     Patient Active Problem List   Diagnosis Date Noted  . Severe sepsis with septic  shock ([place]) 08/21/2020  . Depression 08/21/2020  . Acute metabolic encephalopathy 25/36/6440  . Fall at home, initial encounter 08/21/2020  . Pelvic ring fracture ([place]) 08/21/2020  . Functional diarrhea 07/29/2019  . Open wound of left knee, leg, and ankle with complication 34/74/2595  . Chest wall pain 10/08/2017  . History of recurrent urinary tract infection 02/03/2017  . Vaginal pessary in situ 02/03/2017  . Iron deficiency anemia due to chronic blood loss 12/01/2016  . Personal history of malignant neoplasm of breast 11/09/2016  . GI bleed 08/21/2016  . Hereditary and idiopathic peripheral neuropathy 09/09/2015  . Malignant neoplasm of left female breast ([place]) 09/09/2015  . Prolapse of vaginal wall with midline cystocele 08/10/2015  . Incomplete bladder emptying 08/10/2015  . Midline low back pain without sciatica 08/10/2015  . Fecal incontinence 08/10/2015  . Recurrent UTI 07/19/2015  . Atrophic vaginitis 07/19/2015  . Adenomatous colon polyp 02/24/2015  . Anemia 02/24/2015  . Chronic sinusitis 02/24/2015  . Diverticulosis 02/24/2015  . Esophagitis 02/24/2015  . GERD (gastroesophageal reflux disease) 02/24/2015  . Hiatal hernia 02/24/2015  . Breast cancer, left breast ([place]) 09/05/2012  . Hypertension     Past Surgical History:  Procedure Laterality Date  . ABDOMINAL HYSTERECTOMY     42 YEARS AGO  . BREAST SURGERY Left 09-06-11   left  modified radical mastectomy on September 06, 2011 483.8 cm primary tumor with a 4.6 cm axillary metastasis. 3 of 13 nodes were positive. T2,N1a lesion. This was a triple negative lesion  . CARPAL TUNNEL RELEASE Right September 25, 2014   Skip Estimable, M.D.  . COLON RESECTION  1985  . COLON SURGERY  20 YEARS AGO  . COLONOSCOPY  2012   DR. ELLIOTT  . COLONOSCOPY WITH PROPOFOL N/A 08/25/2016   Procedure: COLONOSCOPY WITH PROPOFOL;  Surgeon: [name], MD;  Location: [facility] ENDOSCOPY;  Service: Gastroenterology;  Laterality: N/A;  .  ESOPHAGOGASTRODUODENOSCOPY (EGD) WITH PROPOFOL N/A 08/23/2016   Procedure: ESOPHAGOGASTRODUODENOSCOPY (EGD) WITH PROPOFOL;  Surgeon: [name], MD;  Location: [facility] ENDOSCOPY;  Service: Endoscopy;  Laterality: N/A;  . MASTECTOMY Left 2013   left modified radical mastectomy on September 06, 2011 Stage 2; 3.8 cm primary tumor with a 4.6 cm axillary metastasis. 3 of 13 nodes were positive. T2,N1a lesion. This was a triple negative lesion  . UPPER GI ENDOSCOPY  2012   BLEEDING    Prior to Admission medications   Medication Sig Start Date End Date Taking? Authorizing Provider  Ascorbic Acid (VITAMIN C) 1000 MG tablet Take 1,000 mg by mouth daily.   Yes [provider]  Cholecalciferol (VITAMIN D) 50 MCG (2000 UT) CAPS Take 1 capsule by mouth daily.   Yes [provider]  Cranberry 500 MG CAPS Take by mouth daily.   Yes [provider]  DULoxetine (CYMBALTA) 30 MG capsule Take 30 mg by mouth daily. 07/09/20  Yes [provider]  Lactobacillus Rhamnosus, GG, (PROBIOTIC COLIC PO) Take 1 Dose by mouth daily. doterra brands   Yes [provider]  Melatonin 5 MG CAPS Take 1 capsule by mouth.    Yes [provider]  metoprolol succinate (TOPROL-XL) 25 MG 24 hr tablet Take 25 mg by mouth daily.   Yes [provider]  pantoprazole (PROTONIX) 40 MG tablet TAKE 1 TABLET BY MOUTH ONCE DAILY Patient taking differently: Take 40 mg by mouth daily. 02/22/18  Yes [name], MD  Potassium 99 MG TABS Take 1 tablet by mouth daily.   Yes [provider]  vitamin B-12 (CYANOCOBALAMIN) 1000 MCG tablet Take 1,000 mcg by mouth daily.   Yes [provider]  amoxicillin-clavulanate (AUGMENTIN) 875-125 MG tablet Take 1 tablet by mouth 2 (two) times daily. Patient not taking: No sig reported 08/04/20   Rubie Maid, MD  loperamide (IMODIUM A-D) 2 MG tablet Take 2 mg by mouth as needed for diarrhea or loose stools.    [provider]     Allergies Codeine  Family History  Problem Relation Age of Onset  . Lung cancer Brother        colono ca  . Cancer Brother   . Skin cancer Daughter   . Leukemia Father   . Cancer Father   . Colon cancer Mother   . Cancer Mother   . Skin cancer Son   . Kidney disease Neg Hx   . Bladder Cancer Neg Hx     Social History Social History   Tobacco Use  . Smoking status: Never Smoker  . Smokeless tobacco: Never Used  Vaping Use  . Vaping Use: Never used  Substance Use Topics  . Alcohol use: No  . Drug use: No    Review of Systems  Unable to be accurately assessed due to patient's altered mentation. ____________________________________________   PHYSICAL EXAM:  VITAL SIGNS: Vitals:  08/21/20 1000 08/21/20 1015  BP:  (!) 168/68  Pulse: (!) 115 (!) 103  Resp: 17 (!) 28  Temp:  98.7 F (37.1 C)  SpO2: 94% 96%     Constitutional: Laying on her side and moaning in generalized discomfort.  Does not answer questions appropriately.  Seems to be moving all 4 extremities without apparent deficit.  Cool to the touch.  Cries out in pain with transfers from EMS stretcher to our own. Eyes: Conjunctivae are normal. PERRL. EOMI. Head: Atraumatic. Nose: No congestion/rhinnorhea. Mouth/Throat: Mucous membranes are dry.  Oropharynx non-erythematous. Neck: No stridor. No cervical spine tenderness to palpation. Cardiovascular: Tachycardic rate, regular rhythm. Grossly normal heart sounds.  Good peripheral circulation. Respiratory: Tachypneic to the upper 20s, clear lungs without retractions. Gastrointestinal: Soft , nondistended. No CVA tenderness. Mild suprapubic tenderness without peritoneal features. Musculoskeletal: No lower extremity tenderness nor edema.  No joint effusions.  Couple superficial skin tears to the bilateral shins, but no deformity or signs of a significant acute trauma. Tenderness to palpation of bilateral hips and pelvis. Neurologic:   No gross focal  neurologic deficits are appreciated.  Skin:  Skin is cool, dry and intact. No rash noted. Psychiatric: Mood and affect are difficult to assess due to confusion  ____________________________________________   LABS (all labs ordered are listed, but only abnormal results are displayed)  Labs Reviewed  LACTIC ACID, PLASMA - Abnormal; Notable for the following components:      Result Value   Lactic Acid, Venous 6.4 (*)    All other components within normal limits  LACTIC ACID, PLASMA - Abnormal; Notable for the following components:   Lactic Acid, Venous >11.0 (*)    All other components within normal limits  COMPREHENSIVE METABOLIC PANEL - Abnormal; Notable for the following components:   Potassium 3.4 (*)    CO2 19 (*)    Glucose, Bld 279 (*)    Calcium 8.7 (*)    Total Bilirubin 1.6 (*)    GFR, Estimated 58 (*)    All other components within normal limits  CBC WITH DIFFERENTIAL/PLATELET - Abnormal; Notable for the following components:   WBC 28.1 (*)    Neutro Abs 24.0 (*)    Monocytes Absolute 1.8 (*)    Abs Immature Granulocytes 0.35 (*)    All other components within normal limits  PROTIME-INR - Abnormal; Notable for the following components:   Prothrombin Time 15.9 (*)    INR 1.3 (*)    All other components within normal limits  URINALYSIS, COMPLETE (UACMP) WITH MICROSCOPIC - Abnormal; Notable for the following components:   Color, Urine YELLOW (*)    APPearance HAZY (*)    Glucose, UA 50 (*)    Ketones, ur 20 (*)    Protein, ur 100 (*)    Leukocytes,Ua TRACE (*)    Bacteria, UA MANY (*)    All other components within normal limits  RESP PANEL BY RT-PCR (FLU A&B, COVID) ARPGX2  CULTURE, BLOOD (ROUTINE X 2)  CULTURE, BLOOD (ROUTINE X 2)  URINE CULTURE  APTT  LIPASE, BLOOD  LACTIC ACID, PLASMA   ____________________________________________  12 Lead EKG  Poor quality EKG due to tremulous patient demonstrates sinus tachycardia with a rate of 116 bpm.  Normal  axis.  Right bundle branch block.  1 PVC.  No evidence of acute STEMI. ____________________________________________  RADIOLOGY  ED MD interpretation: CT abdomen/pelvis reviewed by me with multiple nondisplaced pelvic fractures, small hematoma anterior to the bladder. CT head reviewed  by me without evidence of acute intracranial pathology. CXR reviewed by me without evidence of acute cardiopulmonary pathology.  Official radiology report(s): CT ABDOMEN PELVIS WO CONTRAST  Result Date: 08/21/2020 CLINICAL DATA:  Fall, evaluate pelvic fractures on radiograph EXAM: CT ABDOMEN AND PELVIS WITHOUT CONTRAST TECHNIQUE: Multidetector CT imaging of the abdomen and pelvis was performed following the standard protocol without IV contrast. COMPARISON:  Pelvic radiograph dated 08/21/2020 FINDINGS: Motion degraded images. Lower chest: Compressive atelectasis in the medial right lower lobe. Hepatobiliary: 18 mm cyst in the inferior right hepatic lobe (series 2/image 21). Gallbladder is unremarkable. No intrahepatic or extrahepatic ductal dilatation. Pancreas: Within normal limits. Spleen: Within normal limits. Adrenals/Urinary Tract: Adrenal glands are within normal limits. 4.2 cm cyst in the posterior left upper kidney (series 2/image 24). No renal, ureteral, or bladder calculi. No hydronephrosis. Bladder is within normal limits. Stomach/Bowel: Stomach is notable for a large hiatal hernia/inverted intrathoracic stomach, incompletely visualized. No evidence of bowel obstruction. Status post right hemicolectomy/ileocecal resection with appendectomy with suture line in the right mid abdomen (series 2/image 34). Vascular/Lymphatic: No evidence of abdominal aortic aneurysm. Atherosclerotic calcifications of the abdominal aorta and branch vessels. No suspicious abdominopelvic lymphadenopathy. Reproductive: Status post hysterectomy.  Vaginal pessary. No adnexal masses. Other: No abdominopelvic ascites. Mild extraperitoneal  hemorrhage anterior to the bladder (series 2/image 66) and in the left anterior pelvis (series 2/image 69). No free air. Musculoskeletal: Suspected nondisplaced right anterolateral 7th rib fracture (series 3/image 8), incompletely visualized and age indeterminate. Multiple bilateral pelvic fractures, all of which may be acute, including: --Anterior column of the left acetabulum (series 2/image 69) --Right superior pubic ramus (series 2/image 70) --Left parasymphyseal region (series 2/image 74) --Left inferior pubic ramus, minimally displaced (series 2/image 79) Additionally, there are nondisplaced bilateral sacral ala fractures which are age indeterminate but likely acute given the additional findings (series 2/image 29). No widening of the sacroiliac joints or pubic symphysis. Degenerative changes of the visualized thoracolumbar spine. Moderate superior endplate compression fracture deformity of L4, age indeterminate but likely chronic, without retropulsion. IMPRESSION: Acute nondisplaced fractures involving the anterior column of the left acetabulum, left parasymphyseal region, and right superior pubic ramus. Acute mildly displaced fracture involving the left inferior pubic ramus. Associated mild extraperitoneal hemorrhage anterior to the bladder and in the left anterior pelvis. No free air. Nondisplaced bilateral sacral ala fractures, also likely acute. Moderate compression fracture deformity at L4, age indeterminate but likely chronic. Suspected nondisplaced right anterolateral 7th rib fracture, incompletely visualized and age indeterminate. Electronically Signed   By: Julian Hy M.D.   On: 08/21/2020 10:10   CT Head Wo Contrast  Result Date: 08/21/2020 CLINICAL DATA:  87 year old female with head and neck injury from fall and mental status change. EXAM: CT HEAD WITHOUT CONTRAST CT CERVICAL SPINE WITHOUT CONTRAST TECHNIQUE: Multidetector CT imaging of the head and cervical spine was performed  following the standard protocol without intravenous contrast. Multiplanar CT image reconstructions of the cervical spine were also generated. COMPARISON:  None. FINDINGS: CT HEAD FINDINGS Brain: No evidence of acute infarction, hemorrhage, hydrocephalus, extra-axial collection or mass lesion/mass effect. Atrophy, chronic small-vessel white matter ischemic changes and small remote bilateral cerebellar infarcts are noted. Vascular: Carotid and vertebral atherosclerotic calcifications are noted. Skull: Normal. Negative for fracture or focal lesion. Sinuses/Orbits: No acute finding. Other: None. CT CERVICAL SPINE FINDINGS Alignment: Normal. Skull base and vertebrae: No acute fracture. No primary bone lesion or focal pathologic process. Soft tissues and spinal canal: No prevertebral fluid or swelling.  No visible canal hematoma. Disc levels: Mild to moderate degenerative disc disease/spondylosis from C4-C7 noted. Mild to moderate multilevel facet arthropathy is identified. Upper chest: No acute abnormality Other: None IMPRESSION: 1. No evidence of acute intracranial abnormality. Atrophy, chronic small-vessel white matter ischemic changes and small remote bilateral cerebellar infarcts. 2. No static evidence of acute injury to the cervical spine. Electronically Signed   By: Margarette Canada M.D.   On: 08/21/2020 08:28   CT Cervical Spine Wo Contrast  Result Date: 08/21/2020 CLINICAL DATA:  87 year old female with head and neck injury from fall and mental status change. EXAM: CT HEAD WITHOUT CONTRAST CT CERVICAL SPINE WITHOUT CONTRAST TECHNIQUE: Multidetector CT imaging of the head and cervical spine was performed following the standard protocol without intravenous contrast. Multiplanar CT image reconstructions of the cervical spine were also generated. COMPARISON:  None. FINDINGS: CT HEAD FINDINGS Brain: No evidence of acute infarction, hemorrhage, hydrocephalus, extra-axial collection or mass lesion/mass effect. Atrophy,  chronic small-vessel white matter ischemic changes and small remote bilateral cerebellar infarcts are noted. Vascular: Carotid and vertebral atherosclerotic calcifications are noted. Skull: Normal. Negative for fracture or focal lesion. Sinuses/Orbits: No acute finding. Other: None. CT CERVICAL SPINE FINDINGS Alignment: Normal. Skull base and vertebrae: No acute fracture. No primary bone lesion or focal pathologic process. Soft tissues and spinal canal: No prevertebral fluid or swelling. No visible canal hematoma. Disc levels: Mild to moderate degenerative disc disease/spondylosis from C4-C7 noted. Mild to moderate multilevel facet arthropathy is identified. Upper chest: No acute abnormality Other: None IMPRESSION: 1. No evidence of acute intracranial abnormality. Atrophy, chronic small-vessel white matter ischemic changes and small remote bilateral cerebellar infarcts. 2. No static evidence of acute injury to the cervical spine. Electronically Signed   By: Margarette Canada M.D.   On: 08/21/2020 08:28   DG Pelvis Portable  Result Date: 08/21/2020 CLINICAL DATA:  Fall, evaluate for rib fracture EXAM: PORTABLE PELVIS 1-2 VIEWS COMPARISON:  None. FINDINGS: Bilateral hips are intact. Bilateral hip joint spaces are preserved. Suspected nondisplaced right pelvic ring fractures, possibly acute. Suspected nondisplaced left superior pubic ramus/parasymphyseal fracture, possibly subacute/chronic. Old left inferior pubic ramus fracture is suspected. Degenerative changes of the lower lumbar spine. IMPRESSION: Bilateral hips are intact. Suspected nondisplaced right pelvic ring fractures, possibly acute. Suspected left pelvic ring fractures, possibly subacute/chronic. Electronically Signed   By: Julian Hy M.D.   On: 08/21/2020 08:58   DG Chest Port 1 View  Result Date: 08/21/2020 CLINICAL DATA:  Found down EXAM: PORTABLE CHEST 1 VIEW COMPARISON:  08/21/2016 FINDINGS: Increased interstitial markings. No focal  consolidation. Possible small right pleural effusion. No pneumothorax. Cardiomegaly. Large hiatal hernia. IMPRESSION: Cardiomegaly. Possible small right pleural effusion. Large hiatal hernia. Electronically Signed   By: Julian Hy M.D.   On: 08/21/2020 08:56    ____________________________________________   PROCEDURES and INTERVENTIONS  Procedure(s) performed (including Critical Care):  .1-3 Lead EKG Interpretation Performed by: Vladimir Crofts, MD Authorized by: Vladimir Crofts, MD     Interpretation: abnormal     ECG rate:  110   ECG rate assessment: tachycardic     Rhythm: sinus tachycardia     Ectopy: none     Conduction: normal   .Critical Care Performed by: Vladimir Crofts, MD Authorized by: Vladimir Crofts, MD   Critical care provider statement:    Critical care time (minutes):  45   Critical care was necessary to treat or prevent imminent or life-threatening deterioration of the following conditions:  Sepsis   Critical care  was time spent personally by me on the following activities:  Discussions with consultants, evaluation of patient's response to treatment, examination of patient, ordering and performing treatments and interventions, ordering and review of laboratory studies, ordering and review of radiographic studies, pulse oximetry, re-evaluation of patient's condition, obtaining history from patient or surrogate and review of old charts    Medications  lactated ringers infusion (has no administration in time range)  lactated ringers bolus 1,000 mL (0 mLs Intravenous Stopped 08/21/20 0824)    And  lactated ringers bolus 1,000 mL (0 mLs Intravenous Stopped 08/21/20 0824)  cefTRIAXone (ROCEPHIN) 2 g in sodium chloride 0.9 % 100 mL IVPB (0 g Intravenous Stopped 08/21/20 0829)  acetaminophen (TYLENOL) tablet 1,000 mg (1,000 mg Oral Given 08/21/20 1100)  ceFEPIme (MAXIPIME) 2 g in sodium chloride 0.9 % 100 mL IVPB (0 g Intravenous Stopped 08/21/20 1131)     ____________________________________________   MDM / ED COURSE   87 year old woman who lives at home presents to the ED after being found down, after a supposed fall causing nondisplaced pelvic fractures, likely due to an underlying pathology of urosepsis, requiring medical admission.  Patient presents tachycardic but hemodynamically stable.  Rectal temperature for [place] 95 F, and she was provided warm blankets with improvement of this.  Exam demonstrates a disoriented and delirious woman without apparent neurovascular deficits.  She cries out in pain, and is tender diffusely when palpating her hips and pelvis.  No evidence of open injury.  Blood work further concerning for sepsis on top of her vital signs, with significant leukocytosis and lactic acidosis.  Cultures were drawn and she was initially provided Rocephin due to concern for urosepsis.  Plain films of chest and pelvis revealed possible nondisplaced pelvic fractures.  Due to possible fractures and to ensure no urologic obstruction, CT imaging obtained and demonstrates no urologic obstruction, but patient does have multiple nondisplaced fractures of the pelvis.  I briefly discussed the case with Dr. Mack Guise, who is busy in the OR, and will review images when able.  I do not anticipate any operative fixations for this patient, primarily due to nondisplaced fracture patterns, and her age.  Her repeat lactic acid is quite elevated, and I am concerned that this may be an erroneous value because her clinical picture is improving despite this worsening lab value.  Due to this, and to ensure appropriate coverage with a possible resistant organism from previous UTI couple weeks ago, broaden antibiotics to cefepime.  I discussed the case with hospitalist medicine, who agrees to admit for further work-up and management.  Clinical Course as of 08/21/20 1142  Sat Aug 21, 2020  0849 Reassessed.  Son now at the bedside and provides additional history.  He  indicates that patient finished a course of antibiotics about 1-2 weeks ago, and was reportedly asking for another prescription due to concern for recurrence of infection.  Son reports that family sees her every day, often multiple times per day.  Son reports that she seemed a little bit "off" last night and not quite as sharp as normal.  They were planning to take her to the hospital today if she was still this way this morning [DS]  0908 Educated son on need for CT abdomen/pelvis to assess for ureteral obstruction pelvic fracture.  Patient clinically the same. [DS]  4259 [id number].  Educated son on CT results of multiple pelvic fractures.  Likely nonoperative. [DS]  2951 I speak with Juliann Pulse, [place] RN, Dr. Mack Guise is in the  OR in a case. She relays pertinent information to him, and he recommends that the hospitalist consult him once admitted [DS]  1056 Repeat lactic noted.  This is slightly surprising considering the improvement of her tachycardia.  We will broaden coverage of antibiotics with the addition of cefepime to her regimen.  Informed nurse of need for repeat lactic in the next hour.  We discussed possible blood acquisition errors by our nursing student who drew this repeat [DS]  9563 [id number].  Patient is clinically sharper and looks improved from her presentation.  Tachycardia has resolved.  I do not anticipate this lactic acid greater than 11 is legitimate.  Her clinical picture points the other direction.  Awaiting callback from hospitalist.  I have son, at the bedside, call his sister at home who has gotten into patient's safe and found all of her signed healthcare forms.  Daughter is healthcare power of attorney, no DNR forms signed [DS]  1139 I discuss case with Dr. Blaine Hamper, who agrees to admit the patient.  We discussed elevating lactic, broadening of antibiotics.  We discussed pending orthopedic surgical evaluation, but extremely low likelihood of an operative fracture [DS]    Clinical  Course User Index [DS] Vladimir Crofts, MD    ____________________________________________   FINAL CLINICAL IMPRESSION(S) / ED DIAGNOSES  Final diagnoses:  Sepsis without acute organ dysfunction, due to unspecified organism [facility])  Fall, initial encounter  Closed nondisplaced fracture of anterior wall of left acetabulum, initial encounter [facility])     ED Discharge Orders    None       Murrell Dome   Note:  This document was prepared using Dragon voice recognition software and may include unintentional dictation errors.   Vladimir Crofts, MD 08/21/20 [phone]

## 2020-08-21 NOTE — ED Notes (Signed)
Son advised patient had lymph nodes removed in left arm about 8 years ago. IV access was placed in left arm prior to his arrival and prior to our knowledge of [place]. Please use the right arm for BP and future phlebotomy.

## 2020-08-21 NOTE — Progress Notes (Signed)
elink monitoring code sepsis.  

## 2020-08-21 NOTE — Progress Notes (Signed)
CODE SEPSIS - PHARMACY COMMUNICATION  **Broad Spectrum Antibiotics should be administered within 1 hour of Sepsis diagnosis**  Time Code Sepsis Called/Page Received: 7948  Antibiotics Ordered: ceftriaxone  Time of 1st antibiotic administration: 0755  Additional action taken by pharmacy: none required  If necessary, Name of Provider/Nurse Contacted: N/A    Dallie Piles ,PharmD Clinical Pharmacist  08/21/2020  7:48 AM

## 2020-08-21 NOTE — ED Notes (Signed)
MD smith at bedside. Family providing information. Patient recently treated for UTI but completed the round of antibiotic about a week ago. Son in room will find out what antibiotic she was on. Patient was prescribed amoxicillin 875 x 10 days.

## 2020-08-22 DIAGNOSIS — G9341 Metabolic encephalopathy: Secondary | ICD-10-CM | POA: Diagnosis not present

## 2020-08-22 DIAGNOSIS — N390 Urinary tract infection, site not specified: Secondary | ICD-10-CM | POA: Diagnosis not present

## 2020-08-22 DIAGNOSIS — S32810A Multiple fractures of pelvis with stable disruption of pelvic ring, initial encounter for closed fracture: Secondary | ICD-10-CM | POA: Diagnosis not present

## 2020-08-22 DIAGNOSIS — A419 Sepsis, unspecified organism: Secondary | ICD-10-CM | POA: Diagnosis not present

## 2020-08-22 LAB — GLUCOSE, CAPILLARY
Glucose-Capillary: 122 mg/dL — ABNORMAL HIGH (ref 70–99)
Glucose-Capillary: 156 mg/dL — ABNORMAL HIGH (ref 70–99)
Glucose-Capillary: 185 mg/dL — ABNORMAL HIGH (ref 70–99)
Glucose-Capillary: 178 mg/dL — ABNORMAL HIGH (ref 70–99)

## 2020-08-22 LAB — CBC
HCT: 35.2 % — ABNORMAL LOW (ref 36.0–46.0)
Hemoglobin: 11.9 g/dL — ABNORMAL LOW (ref 12.0–15.0)
MCH: 31.2 pg (ref 26.0–34.0)
MCHC: 33.8 g/dL (ref 30.0–36.0)
MCV: 92.4 fL (ref 80.0–100.0)
Platelets: 217 10*3/uL (ref 150–400)
RBC: 3.81 MIL/uL — ABNORMAL LOW (ref 3.87–5.11)
RDW: 13.8 % (ref 11.5–15.5)
WBC: 15.7 10*3/uL — ABNORMAL HIGH (ref 4.0–10.5)
nRBC: 0 % (ref 0.0–0.2)

## 2020-08-22 LAB — BASIC METABOLIC PANEL
Anion gap: 9 (ref 5–15)
BUN: 13 mg/dL (ref 8–23)
CO2: 22 mmol/L (ref 22–32)
Calcium: 8.7 mg/dL — ABNORMAL LOW (ref 8.9–10.3)
Chloride: 106 mmol/L (ref 98–111)
Creatinine, Ser: 0.73 mg/dL (ref 0.44–1.00)
GFR, Estimated: >60 mL/min (ref >60)
Glucose, Bld: 140 mg/dL — ABNORMAL HIGH (ref 70–99)
Potassium: 4.1 mmol/L (ref 3.5–5.1)
Sodium: 137 mmol/L (ref 135–145)

## 2020-08-22 MED ORDER — ENOXAPARIN SODIUM 40 MG/0.4ML ~~LOC~~ SOLN
40.0000 mg | SUBCUTANEOUS | Status: DC
  Administered 2020-08-22 – 2020-08-25 (×4): 40 mg via SUBCUTANEOUS
  Filled 2020-08-22 (×4): qty 0.4

## 2020-08-22 MED ORDER — SODIUM CHLORIDE 0.9 % IV SOLN
2.0000 g | INTRAVENOUS | Status: DC
  Administered 2020-08-22: 2 g via INTRAVENOUS
  Filled 2020-08-22 (×2): qty 20

## 2020-08-22 NOTE — Evaluation (Signed)
Physical Therapy Evaluation Patient Details Name: [name] MRN: [id number] DOB: 05-Mar-1921 Today's Date: 08/22/2020   History of Present Illness  Patient is a 87 y.o. female with medical history significant of hypertension, GERD, depression, stomach ulcer, GI bleeding, breast cancer (s/p of left mastectomy), remote colon cancer (s/p for colectomy), arthritis, recurrent UTI, who presents with fall, altered mental status and increased urinary frequency. Found to have nondisplaced fractures involving the right superior and inferior pubic rami, nondisplaced bilateral sacral ala fractures chronic appearing L4 compression fracture, possible right 7th rib FX.   Clinical Impression  Patient agreeable to PT with daughter at the bedside. Patient had been incontinent of bowels prior to arrival to room. Patient required significant assistance for rolling in bed with activity tolerance limited by pelvic pain. Unable to progress to sitting up on edge of bed due to pain. Patient only complains of pain with mobility efforts. Patient is very hard of hearing but is able to follow single step commands with repetition. Patient is previously independent and lives alone with intermittent supervision/assistance from daughter at baseline. Recommend PT to maximize function and safety to facilitate independence and decrease caregiver burden. Patient will require SNF placement for rehab efforts at discharge.      Follow Up Recommendations SNF    Equipment Recommendations   (to be determined at next level of care)    Recommendations for Other Services       Precautions / Restrictions Precautions Precautions: Fall Restrictions Weight Bearing Restrictions: Yes (WBAT BLE per ortho note)      Mobility  Bed Mobility Overal bed mobility: Needs Assistance Bed Mobility: Rolling Rolling: Max assist;+2 for physical assistance         General bed mobility comments: patient required +2 person physical assistance  for rolling to left and right in bed with verbal and tactile cues for sequencing and technique. patient unable to progress to sitting up on edge of bed due to pain with rolling and limited overall activity tolerance    Transfers                    Ambulation/Gait                Stairs            Wheelchair Mobility    Modified Rankin (Stroke Patients Only)       Balance                                             Pertinent Vitals/Pain Pain Assessment: Faces Faces Pain Scale: Hurts whole lot Pain Location: patient reaches to posterior pelvic area with mobility efforts. difficulty verbally stating where her pain is located    [place] expects to be discharged to:: Private residence Living Arrangements: Alone Available Help at Discharge: Family;Available PRN/intermittently Type of Home: House         Home Equipment: Walker - 4 wheels;Shower seat;Grab bars - tub/shower      Prior Function Level of Independence: Independent with assistive device(s);Needs assistance   Gait / Transfers Assistance Needed: Mod I with use of rollator  ADL's / Homemaking Assistance Needed: daughter assists with bathing  Comments: patient has meals delivered daily by her daughter, has assistance with medication set-up afrom daughter     Hand Dominance        Extremity/Trunk Assessment  Upper Extremity Assessment Upper Extremity Assessment: Generalized weakness    Lower Extremity Assessment Lower Extremity Assessment: Generalized weakness;Difficult to assess due to impaired cognition       Communication   Communication: No difficulties;HOH (very HOH and did not have hearing aides in during evaluation)  Cognition Arousal/Alertness: Awake/alert Behavior During Therapy: WFL for tasks assessed/performed Overall Cognitive Status: Impaired/Different from baseline Area of Impairment: Memory;Orientation                  Orientation Level: Disoriented to;Time   Memory: Decreased short-term memory         General Comments: patient is very HOH and required frequent repetition to follow single step commands      General Comments      Exercises     Assessment/Plan    PT Assessment Patient needs continued PT services  PT Problem List Decreased strength;Decreased range of motion;Decreased activity tolerance;Decreased balance;Decreased mobility;Decreased cognition;Decreased knowledge of use of DME;Decreased safety awareness;Decreased knowledge of precautions;Pain       PT Treatment Interventions DME instruction;Gait training;Stair training;Functional mobility training;Therapeutic activities;Therapeutic exercise;Neuromuscular re-education;Balance training;Cognitive remediation;Patient/family education    PT Goals (Current goals can be found in the Care Plan section)  Acute Rehab PT Goals Patient Stated Goal: none stated PT Goal Formulation: With patient/family Time For Goal Achievement: 09/05/20 Potential to Achieve Goals: Fair    Frequency Min 2X/week   Barriers to discharge        Co-evaluation PT/OT/SLP Co-Evaluation/Treatment: Yes Reason for Co-Treatment: Complexity of the patient's impairments (multi-system involvement) PT goals addressed during session: Mobility/safety with mobility         AM-PAC PT "6 Clicks" Mobility  Outcome Measure Help needed turning from your back to your side while in a flat bed without using bedrails?: Total Help needed moving from lying on your back to sitting on the side of a flat bed without using bedrails?: Total Help needed moving to and from a bed to a chair (including a wheelchair)?: Total Help needed standing up from a chair using your arms (e.g., wheelchair or bedside chair)?: Total Help needed to walk in hospital room?: Total Help needed climbing 3-5 steps with a railing? : Total 6 Click Score: 6    End of Session   Activity Tolerance:  Patient limited by pain;Patient limited by fatigue Patient left: in bed;with call bell/phone within reach;with bed alarm set;with family/visitor present;with SCD's reapplied Nurse Communication: Mobility status PT Visit Diagnosis: Muscle weakness (generalized) (M62.81);Difficulty in walking, not elsewhere classified (R26.2);Pain Pain - Right/Left:  (right and left) Pain - part of body:  (pelvis)    Time: 2947-6546 PT Time Calculation (min) (ACUTE ONLY): 37 min   Charges:   PT Evaluation $PT Eval Moderate Complexity: 1 Mod PT Treatments $Therapeutic Activity: 8-22 mins        Minna Merritts, PT, MPT   Percell Locus 08/22/2020, 11:57 AM

## 2020-08-22 NOTE — TOC Progression Note (Signed)
Transition of Care [facility]) - Progression Note    Patient Details  Name: [name] MRN: [id number] Date of Birth: Oct 10, 1920  [facility]) CM/SW Contact  Izola Price, RN Phone Number: 08/22/2020, 3:11 PM  Clinical Narrative:  HAS POA.  Admit on 3/26 with sepsis/UTI/confusion/increase lactic acid. New TOC consult for SNF.  Ronne Binning [facility]), [name] (Daughter)  [phone] (Mobile         Expected Discharge Plan and Services                                                 Social Determinants of Health (SDOH) Interventions    Readmission Risk Interventions No flowsheet data found.

## 2020-08-22 NOTE — Progress Notes (Addendum)
PROGRESS NOTE    [name]  MBT:[id number] DOB: February 17, 1921 DOA: 08/21/2020 PCP: [name], MD   Chief complaint.  Altered mental status. Brief Narrative:  [name] is a 87 y.o. female with medical history significant of hypertension, GERD, depression, stomach ulcer, GI bleeding, breast cancer (s/p of left mastectomy), remote colon cancer (s/p for colectomy), arthritis, recurrent UTI, who presents with fall, altered mental status and increased urinary frequency.  Per patient daughter, patient was very confused with hallucination, she wandered off home, had a fall.  X-ray showed a pelvic fracture, no surgical indication.  Patient was also had a severe lactic acidosis of 11.0.  Leukocytosis of 28.  She was given IV fluid in the emergency room, she was covered with meropenem for UTI.   Assessment & Plan:   Principal Problem:   Severe sepsis with septic shock [facility]) Active Problems:   Hypertension   GERD (gastroesophageal reflux disease)   Recurrent UTI   Depression   Acute metabolic encephalopathy   Fall at home, initial encounter   Pelvic ring fracture (HCC)   Hypokalemia   Hyperglycemia  #1.  Severe sepsis with septic shock secondary to urinary tract infection. Urinary tract infection. Acute metabolic encephalopathy with baseline dementia. Met the patient daughter this morning, patient was recently treated for UTI for 10 days.  She had altered mental status prior to admission.  She met criteria of septic shock with a severe leukocytosis, tachycardia, tachypnea and a lactic acid of 11.0. Lactic acidosis appears to be secondary to infection. Discussed with patient daughter, patient did not have significant abdominal pain, my examination today does not show any evidence of acute abdomen.  No suspicion for ischemic bowel. Due to severity of her condition, I will continue meropenem for now. Patient also has baseline memory issues, had a hallucination and confusion before  admission.  Mental status seem to be improving today.  2.  Essential hypertension. Continue metoprolol.  3.  Pelvic fracture with fall. Mechanical fall. No surgical indication for orthopedics.  Continue pain control.   1710>> Urine culture positive for Kleb Pneumonae, blood culture so far negative. Antibiotic changed to Rocephin.    DVT prophylaxis: Lovenox Code Status: Full Family Communication: Daughter updated at bedside. Disposition Plan:  .   Status is: Inpatient  Remains inpatient appropriate because:Inpatient level of care appropriate due to severity of illness   Dispo: The patient is from: Home              Anticipated d/c is to: SNF              Patient currently is not medically stable to d/c.   Difficult to place patient No        I/O last 3 completed shifts: In: 3514.4 [P.O.:150; I.V.:531.7; IV Piggyback:2832.7] Out: 400 [Urine:400] Total I/O In: 240 [P.O.:240] Out: -      Consultants:   orthopedics  Procedures: None  Antimicrobials: Meropenem.  Subjective: Patient still has some confusion, but appears to be baseline. No hypoxia, no short of breath. No abdominal pain or nausea vomiting.  Her appetite seems to be decent, she was fed by her daughter. No fever chills. No dysuria hematuria, no flank pain. Chest pain or palpitation.  Objective: Vitals:   08/21/20 1755 08/21/20 2047 08/22/20 0351 08/22/20 0744  BP: 134/83 (!) 167/90 (!) 153/91 (!) 181/100  Pulse: (!) 102 (!) 101 87 95  Resp:  _0 Temp:  99.6 F (37.6 C) 98 F (  36.7 C) 99.4 F (37.4 C)  TempSrc:  Oral  Oral  SpO2:  95% 93% 92%  Weight:      Height:        Intake/Output Summary (Last 24 hours) at 08/22/2020 1034 Last data filed at 08/22/2020 0950 Gross per 24 hour  Intake 1754.37 ml  Output 400 ml  Net 1354.37 ml   Filed Weights   08/21/20 0727 08/21/20 1627  Weight: 65.8 kg 71.9 kg    Examination:  General exam: Appears calm and comfortable   Respiratory system: Clear to auscultation. Respiratory effort normal. Cardiovascular system: S1 & S2 heard, RRR. No JVD, murmurs, rubs, gallops or clicks. No pedal edema. Gastrointestinal system: Abdomen is nondistended, soft and nontender. No organomegaly or masses felt. Normal bowel sounds heard. Central nervous system: Alert and oriented x1. No focal neurological deficits. Extremities: Symmetric 5 x 5 power. Skin: No rashes, lesions or ulcers     Data Reviewed: I have personally reviewed following labs and imaging studies  CBC: Recent Labs  Lab 08/21/20 0738 08/22/20 0545  WBC 28.1* 15.7*  NEUTROABS 24.0*  --   HGB 14.0 11.9*  HCT 43.0 35.2*  MCV 95.8 92.4  PLT 300 448   Basic Metabolic Panel: Recent Labs  Lab 08/21/20 0738 08/22/20 0545  NA 138 137  K 3.4* 4.1  CL 105 106  CO2 19* 22  GLUCOSE 279* 140*  BUN 17 13  CREATININE 0.90 0.73  CALCIUM 8.7* 8.7*  MG 2.2  --    GFR: Estimated Creatinine Clearance: 38.2 mL/min (by C-G formula based on SCr of 0.73 mg/dL). Liver Function Tests: Recent Labs  Lab 08/21/20 0738  AST 29  ALT 21  ALKPHOS 68  BILITOT 1.6*  PROT 7.3  ALBUMIN 3.9   Recent Labs  Lab 08/21/20 0738  LIPASE 27   No results for input(s): AMMONIA in the last 168 hours. Coagulation Profile: Recent Labs  Lab 08/21/20 0738  INR 1.3*   Cardiac Enzymes: Recent Labs  Lab 08/21/20 0738  CKTOTAL 148   BNP (last 3 results) No results for input(s): PROBNP in the last 8760 hours. HbA1C: Recent Labs    08/21/20 0738  HGBA1C 6.4*   CBG: Recent Labs  Lab 08/21/20 1651 08/21/20 2130 08/22/20 0743  GLUCAP 167* 174* 122*   Lipid Profile: No results for input(s): CHOL, HDL, LDLCALC, TRIG, CHOLHDL, LDLDIRECT in the last 72 hours. Thyroid Function Tests: No results for input(s): TSH, T4TOTAL, FREET4, T3FREE, THYROIDAB in the last 72 hours. Anemia Panel: No results for input(s): VITAMINB12, FOLATE, FERRITIN, TIBC, IRON, RETICCTPCT in  the last 72 hours. Sepsis Labs: Recent Labs  Lab 08/21/20 0738 08/21/20 1013 08/21/20 1135 08/21/20 1628  PROCALCITON <0.10  --   --   --   LATICACIDVEN 6.4* >11.0* 4.5* 3.4*    Recent Results (from the past 240 hour(s))  Resp Panel by RT-PCR (Flu A&B, Covid) Nasopharyngeal Swab     Status: None   Collection Time: 08/21/20  7:38 AM   Specimen: Nasopharyngeal Swab; Nasopharyngeal(NP) swabs in vial transport medium  Result Value Ref Range Status   SARS Coronavirus 2 by RT PCR NEGATIVE NEGATIVE Final    Comment: (NOTE) SARS-CoV-2 target nucleic acids are NOT DETECTED.  The SARS-CoV-2 RNA is generally detectable in upper respiratory specimens during the acute phase of infection. The lowest concentration of SARS-CoV-2 viral copies this assay can detect is 138 copies/mL. A negative result does not preclude SARS-Cov-2 infection and should not be used as  the sole basis for treatment or other patient management decisions. A negative result may occur with  improper specimen collection/handling, submission of specimen other than nasopharyngeal swab, presence of viral mutation(s) within the areas targeted by this assay, and inadequate number of viral copies(<138 copies/mL). A negative result must be combined with clinical observations, patient history, and epidemiological information. The expected result is Negative.  Fact Sheet for Patients:  EntrepreneurPulse.com.au  Fact Sheet for Healthcare Providers:  IncredibleEmployment.be  This test is no t yet approved or cleared by the [place] FDA and  has been authorized for detection and/or diagnosis of SARS-CoV-2 by FDA under an Emergency Use Authorization (EUA). This EUA will remain  in effect (meaning this test can be used) for the duration of the COVID-19 declaration under Section 564(b)(1) of the Act, 21 U.S.C.section 360bbb-3(b)(1), unless the authorization is terminated  or revoked sooner.        Influenza A by PCR NEGATIVE NEGATIVE Final   Influenza B by PCR NEGATIVE NEGATIVE Final    Comment: (NOTE) The Xpert Xpress SARS-CoV-2/FLU/RSV plus assay is intended as an aid in the diagnosis of influenza from Nasopharyngeal swab specimens and should not be used as a sole basis for treatment. Nasal washings and aspirates are unacceptable for Xpert Xpress SARS-CoV-2/FLU/RSV testing.  Fact Sheet for Patients: EntrepreneurPulse.com.au  Fact Sheet for Healthcare Providers: IncredibleEmployment.be  This test is not yet approved or cleared by the [place] FDA and has been authorized for detection and/or diagnosis of SARS-CoV-2 by FDA under an Emergency Use Authorization (EUA). This EUA will remain in effect (meaning this test can be used) for the duration of the COVID-19 declaration under Section 564(b)(1) of the Act, 21 U.S.C. section 360bbb-3(b)(1), unless the authorization is terminated or revoked.  Performed at [facility], [address] [place], [place] [address]   Blood Culture (routine x 2)     Status: None (Preliminary result)   Collection Time: 08/21/20  7:38 AM   Specimen: BLOOD  Result Value Ref Range Status   Specimen Description BLOOD RIGHT ASSIST CONTROL  Final   Special Requests   Final    BOTTLES DRAWN AEROBIC AND ANAEROBIC Blood Culture results may not be optimal due to an excessive volume of blood received in culture bottles   Culture   Final    NO GROWTH < 24 HOURS Performed at [facility], [address], [place], [place] [address]    Report Status PENDING  Incomplete  Blood Culture (routine x 2)     Status: None (Preliminary result)   Collection Time: 08/21/20  7:38 AM   Specimen: BLOOD  Result Value Ref Range Status   Specimen Description BLOOD LEFT ASSIST CONTROL  Final   Special Requests   Final    BOTTLES DRAWN AEROBIC AND ANAEROBIC Blood Culture results may not be optimal due to  an excessive volume of blood received in culture bottles   Culture   Final    NO GROWTH < 24 HOURS Performed at [facility], [address] [place], [place] [address]    Report Status PENDING  Incomplete         Radiology Studies: CT ABDOMEN PELVIS WO CONTRAST  Result Date: 08/21/2020 CLINICAL DATA:  Fall, evaluate pelvic fractures on radiograph EXAM: CT ABDOMEN AND PELVIS WITHOUT CONTRAST TECHNIQUE: Multidetector CT imaging of the abdomen and pelvis was performed following the standard protocol without IV contrast. COMPARISON:  Pelvic radiograph dated 08/21/2020 FINDINGS: Motion degraded images. Lower chest:  Compressive atelectasis in the medial right lower lobe. Hepatobiliary: 18 mm cyst in the inferior right hepatic lobe (series 2/image 21). Gallbladder is unremarkable. No intrahepatic or extrahepatic ductal dilatation. Pancreas: Within normal limits. Spleen: Within normal limits. Adrenals/Urinary Tract: Adrenal glands are within normal limits. 4.2 cm cyst in the posterior left upper kidney (series 2/image 24). No renal, ureteral, or bladder calculi. No hydronephrosis. Bladder is within normal limits. Stomach/Bowel: Stomach is notable for a large hiatal hernia/inverted intrathoracic stomach, incompletely visualized. No evidence of bowel obstruction. Status post right hemicolectomy/ileocecal resection with appendectomy with suture line in the right mid abdomen (series 2/image 34). Vascular/Lymphatic: No evidence of abdominal aortic aneurysm. Atherosclerotic calcifications of the abdominal aorta and branch vessels. No suspicious abdominopelvic lymphadenopathy. Reproductive: Status post hysterectomy.  Vaginal pessary. No adnexal masses. Other: No abdominopelvic ascites. Mild extraperitoneal hemorrhage anterior to the bladder (series 2/image 66) and in the left anterior pelvis (series 2/image 69). No free air. Musculoskeletal: Suspected nondisplaced right anterolateral 7th rib fracture  (series 3/image 8), incompletely visualized and age indeterminate. Multiple bilateral pelvic fractures, all of which may be acute, including: --Anterior column of the left acetabulum (series 2/image 69) --Right superior pubic ramus (series 2/image 70) --Left parasymphyseal region (series 2/image 74) --Left inferior pubic ramus, minimally displaced (series 2/image 79) Additionally, there are nondisplaced bilateral sacral ala fractures which are age indeterminate but likely acute given the additional findings (series 2/image 62). No widening of the sacroiliac joints or pubic symphysis. Degenerative changes of the visualized thoracolumbar spine. Moderate superior endplate compression fracture deformity of L4, age indeterminate but likely chronic, without retropulsion. IMPRESSION: Acute nondisplaced fractures involving the anterior column of the left acetabulum, left parasymphyseal region, and right superior pubic ramus. Acute mildly displaced fracture involving the left inferior pubic ramus. Associated mild extraperitoneal hemorrhage anterior to the bladder and in the left anterior pelvis. No free air. Nondisplaced bilateral sacral ala fractures, also likely acute. Moderate compression fracture deformity at L4, age indeterminate but likely chronic. Suspected nondisplaced right anterolateral 7th rib fracture, incompletely visualized and age indeterminate. Electronically Signed   By: Julian Hy M.D.   On: 08/21/2020 10:10   CT Head Wo Contrast  Result Date: 08/21/2020 CLINICAL DATA:  87 year old female with head and neck injury from fall and mental status change. EXAM: CT HEAD WITHOUT CONTRAST CT CERVICAL SPINE WITHOUT CONTRAST TECHNIQUE: Multidetector CT imaging of the head and cervical spine was performed following the standard protocol without intravenous contrast. Multiplanar CT image reconstructions of the cervical spine were also generated. COMPARISON:  None. FINDINGS: CT HEAD FINDINGS Brain: No evidence  of acute infarction, hemorrhage, hydrocephalus, extra-axial collection or mass lesion/mass effect. Atrophy, chronic small-vessel white matter ischemic changes and small remote bilateral cerebellar infarcts are noted. Vascular: Carotid and vertebral atherosclerotic calcifications are noted. Skull: Normal. Negative for fracture or focal lesion. Sinuses/Orbits: No acute finding. Other: None. CT CERVICAL SPINE FINDINGS Alignment: Normal. Skull base and vertebrae: No acute fracture. No primary bone lesion or focal pathologic process. Soft tissues and spinal canal: No prevertebral fluid or swelling. No visible canal hematoma. Disc levels: Mild to moderate degenerative disc disease/spondylosis from C4-C7 noted. Mild to moderate multilevel facet arthropathy is identified. Upper chest: No acute abnormality Other: None IMPRESSION: 1. No evidence of acute intracranial abnormality. Atrophy, chronic small-vessel white matter ischemic changes and small remote bilateral cerebellar infarcts. 2. No static evidence of acute injury to the cervical spine. Electronically Signed   By: Margarette Canada M.D.   On: 08/21/2020 08:28  CT Cervical Spine Wo Contrast  Result Date: 08/21/2020 CLINICAL DATA:  87 year old female with head and neck injury from fall and mental status change. EXAM: CT HEAD WITHOUT CONTRAST CT CERVICAL SPINE WITHOUT CONTRAST TECHNIQUE: Multidetector CT imaging of the head and cervical spine was performed following the standard protocol without intravenous contrast. Multiplanar CT image reconstructions of the cervical spine were also generated. COMPARISON:  None. FINDINGS: CT HEAD FINDINGS Brain: No evidence of acute infarction, hemorrhage, hydrocephalus, extra-axial collection or mass lesion/mass effect. Atrophy, chronic small-vessel white matter ischemic changes and small remote bilateral cerebellar infarcts are noted. Vascular: Carotid and vertebral atherosclerotic calcifications are noted. Skull: Normal. Negative  for fracture or focal lesion. Sinuses/Orbits: No acute finding. Other: None. CT CERVICAL SPINE FINDINGS Alignment: Normal. Skull base and vertebrae: No acute fracture. No primary bone lesion or focal pathologic process. Soft tissues and spinal canal: No prevertebral fluid or swelling. No visible canal hematoma. Disc levels: Mild to moderate degenerative disc disease/spondylosis from C4-C7 noted. Mild to moderate multilevel facet arthropathy is identified. Upper chest: No acute abnormality Other: None IMPRESSION: 1. No evidence of acute intracranial abnormality. Atrophy, chronic small-vessel white matter ischemic changes and small remote bilateral cerebellar infarcts. 2. No static evidence of acute injury to the cervical spine. Electronically Signed   By: Margarette Canada M.D.   On: 08/21/2020 08:28   DG Pelvis Portable  Result Date: 08/21/2020 CLINICAL DATA:  Fall, evaluate for rib fracture EXAM: PORTABLE PELVIS 1-2 VIEWS COMPARISON:  None. FINDINGS: Bilateral hips are intact. Bilateral hip joint spaces are preserved. Suspected nondisplaced right pelvic ring fractures, possibly acute. Suspected nondisplaced left superior pubic ramus/parasymphyseal fracture, possibly subacute/chronic. Old left inferior pubic ramus fracture is suspected. Degenerative changes of the lower lumbar spine. IMPRESSION: Bilateral hips are intact. Suspected nondisplaced right pelvic ring fractures, possibly acute. Suspected left pelvic ring fractures, possibly subacute/chronic. Electronically Signed   By: Julian Hy M.D.   On: 08/21/2020 08:58   DG Chest Port 1 View  Result Date: 08/21/2020 CLINICAL DATA:  Found down EXAM: PORTABLE CHEST 1 VIEW COMPARISON:  08/21/2016 FINDINGS: Increased interstitial markings. No focal consolidation. Possible small right pleural effusion. No pneumothorax. Cardiomegaly. Large hiatal hernia. IMPRESSION: Cardiomegaly. Possible small right pleural effusion. Large hiatal hernia. Electronically Signed    By: Julian Hy M.D.   On: 08/21/2020 08:56        Scheduled Meds: . vitamin C  1,000 mg Oral Daily  . cholecalciferol  2,000 Units Oral Daily  . DULoxetine  30 mg Oral Daily  . insulin aspart  0-5 Units Subcutaneous QHS  . insulin aspart  0-9 Units Subcutaneous TID WC  . metoprolol succinate  25 mg Oral Daily  . pantoprazole  40 mg Oral Daily  . pneumococcal 23 valent vaccine  0.5 mL Intramuscular Tomorrow-1000  . vitamin B-12  1,000 mcg Oral Daily   Continuous Infusions: . meropenem (MERREM) IV 1 g (08/22/20 0922)     LOS: 1 day    Time spent: 34 minutes    Sharen Hones, MD [facility]   To contact the attending provider between 7A-7P or the covering provider during after hours 7P-7A, please log into the web site [email or web address] and access using universal [place] password for that web site. If you do not have the password, please call the hospital operator.  08/22/2020, 10:34 AM

## 2020-08-22 NOTE — Evaluation (Signed)
Occupational Therapy Evaluation Patient Details Name: [name] MRN: [id number] DOB: October 30, 1920 Today's Date: 08/22/2020    History of Present Illness Patient is a 87 y.o. female with medical history significant of hypertension, GERD, depression, stomach ulcer, GI bleeding, breast cancer (s/p of left mastectomy), remote colon cancer (s/p for colectomy), arthritis, recurrent UTI, who presents with fall, altered mental status and increased urinary frequency. Found to have nondisplaced fractures involving the right superior and inferior pubic rami, nondisplaced bilateral sacral ala fractures chronic appearing L4 compression fracture, possible right 7th rib FX   Clinical Impression   Patient presenting with decreased I in self care, balance, functional mobility/transfers, endurance, and safety awareness.  Pt's daughter present in room to confirm baseline. Pt lives alone but daughter comes in several times a day to assist/check in on patient. Her daughter brings her meals and assists with medication management. Per daughter she has a short term memory deficit. Pt ambulates with rollator at home and daughter assists pt with bathing. During evaluation, pt noted to have been incontinent of bowel and bladder. Pt needing total A of 2 to roll pt and provide assistance with hygiene and linen change. Pt crying out secondary to increased pain with bed mobility. Pt will need higher level of care to address functional deficits.  Patient will benefit from acute OT to increase overall independence in the areas of ADLs, functional mobility, and safety awareness in order to safely discharge to next venue of care.    Follow Up Recommendations  SNF;Supervision/Assistance - 24 hour    Equipment Recommendations  Other (comment) (defer to next venue of care)       Precautions / Restrictions Precautions Precautions: Fall Restrictions Weight Bearing Restrictions: Yes Other Position/Activity Restrictions: WBAT       Mobility Bed Mobility Overal bed mobility: Needs Assistance Bed Mobility: Rolling Rolling: Max assist;+2 for physical assistance         General bed mobility comments: patient required +2 person physical assistance for rolling to left and right in bed with verbal and tactile cues for sequencing and technique. patient unable to progress to sitting up on edge of bed due to pain with rolling and limited overall activity tolerance    Transfers                 General transfer comment: deferred secondary to safety and pain management        ADL either performed or assessed with clinical judgement   ADL Overall ADL's : Needs assistance/impaired                                       General ADL Comments: Pt incontient of bowel and bladder needing linen changed and total A for hygiene. Pt with increased pain with bed mobility for self care tasks this session.     Vision Patient Visual Report: No change from baseline              Pertinent Vitals/Pain Pain Assessment: Faces Faces Pain Scale: Hurts whole lot Pain Location: patient reaches to posterior pelvic area with mobility efforts. difficulty verbally stating where her pain is located Pain Intervention(s): Limited activity within patient's tolerance;Repositioned;Monitored during session     Hand Dominance Right   Extremity/Trunk Assessment Upper Extremity Assessment Upper Extremity Assessment: Generalized weakness   Lower Extremity Assessment Lower Extremity Assessment: Generalized weakness;Difficult to assess due to impaired cognition  Communication Communication Communication: No difficulties;HOH   Cognition Arousal/Alertness: Awake/alert Behavior During Therapy: WFL for tasks assessed/performed Overall Cognitive Status: Impaired/Different from baseline Area of Impairment: Memory;Orientation                 Orientation Level: Disoriented to;Time   Memory: Decreased  short-term memory         General Comments: patient is very HOH and required frequent repetition to follow single step commands. Pt asking daughter what day it is and if it was "May"              Home Living Family/patient expects to be discharged to:: Private residence Living Arrangements: Alone Available Help at Discharge: Family;Available PRN/intermittently Type of Home: House Home Access: Stairs to enter [employer] of Steps: 3 Entrance Stairs-Rails: Left Home Layout: One level     Bathroom Shower/Tub: Walk-in shower         Home Equipment: Environmental consultant - 4 wheels;Shower seat;Grab bars - tub/shower          Prior Functioning/Environment Level of Independence: Independent with assistive device(s);Needs assistance  Gait / Transfers Assistance Needed: Mod I with use of rollator ADL's / Homemaking Assistance Needed: daughter assists with bathing   Comments: Pt's daughter delivers meals and assists with medication management. Caregiver reports, " She has short term problems"        OT Problem List: Decreased strength;Impaired balance (sitting and/or standing);Decreased activity tolerance;Decreased coordination;Decreased knowledge of use of DME or AE;Decreased safety awareness;Cardiopulmonary status limiting activity;Decreased cognition;Decreased knowledge of precautions;Pain      OT Treatment/Interventions: Self-care/ADL training;Manual therapy;Therapeutic exercise;Energy conservation;DME and/or AE instruction;Patient/family education;Balance training;Therapeutic activities;Cognitive remediation/compensation    OT Goals(Current goals can be found in the care plan section) Acute Rehab OT Goals Patient Stated Goal: none stated OT Goal Formulation: With patient Time For Goal Achievement: 09/05/20 Potential to Achieve Goals: Fair ADL Goals Pt Will Perform Grooming: with min assist;standing Pt Will Perform Lower Body Dressing: with min assist;sit to/from stand Pt  Will Transfer to Toilet: with min assist;ambulating Pt Will Perform Toileting - Clothing Manipulation and hygiene: with min assist;sit to/from stand  OT Frequency: Min 2X/week   Barriers to D/C:    none known at this time       Co-evaluation PT/OT/SLP Co-Evaluation/Treatment: Yes Reason for Co-Treatment: Complexity of the patient's impairments (multi-system involvement);For patient/therapist safety;To address functional/ADL transfers PT goals addressed during session: Mobility/safety with mobility OT goals addressed during session: ADL's and self-care      AM-PAC OT "6 Clicks" Daily Activity     Outcome Measure Help from another person eating meals?: A Little Help from another person taking care of personal grooming?: A Little Help from another person toileting, which includes using toliet, bedpan, or urinal?: Total Help from another person bathing (including washing, rinsing, drying)?: Total Help from another person to put on and taking off regular upper body clothing?: A Lot Help from another person to put on and taking off regular lower body clothing?: Total 6 Click Score: 11   End of Session Nurse Communication: Mobility status  Activity Tolerance: Patient limited by lethargy Patient left: in bed;with call bell/phone within reach;with bed alarm set;with family/visitor present  OT Visit Diagnosis: Unsteadiness on feet (R26.81);Repeated falls (R29.6);Muscle weakness (generalized) (M62.81);Pain Pain - part of body:  (generalized)                Time: 4696-2952 OT Time Calculation (min): 30 min Charges:  OT General Charges $OT Visit: 1 Visit OT Evaluation $OT  Eval Moderate Complexity: [address] MS, OTR/L , CBIS ascom [phone]  08/22/20, 12:36 PM

## 2020-08-23 DIAGNOSIS — A419 Sepsis, unspecified organism: Secondary | ICD-10-CM | POA: Diagnosis not present

## 2020-08-23 DIAGNOSIS — S32810A Multiple fractures of pelvis with stable disruption of pelvic ring, initial encounter for closed fracture: Secondary | ICD-10-CM | POA: Diagnosis not present

## 2020-08-23 DIAGNOSIS — G9341 Metabolic encephalopathy: Secondary | ICD-10-CM | POA: Diagnosis not present

## 2020-08-23 DIAGNOSIS — N390 Urinary tract infection, site not specified: Secondary | ICD-10-CM | POA: Diagnosis not present

## 2020-08-23 LAB — CBC WITH DIFFERENTIAL/PLATELET
Abs Immature Granulocytes: 0.11 10*3/uL — ABNORMAL HIGH (ref 0.00–0.07)
Basophils Absolute: 0.1 10*3/uL (ref 0.0–0.1)
Basophils Relative: 0 %
Eosinophils Absolute: 0.1 10*3/uL (ref 0.0–0.5)
Eosinophils Relative: 1 %
HCT: 35.2 % — ABNORMAL LOW (ref 36.0–46.0)
Hemoglobin: 11.8 g/dL — ABNORMAL LOW (ref 12.0–15.0)
Immature Granulocytes: 1 %
Lymphocytes Relative: 13 %
Lymphs Abs: 2 10*3/uL (ref 0.7–4.0)
MCH: 31.2 pg (ref 26.0–34.0)
MCHC: 33.5 g/dL (ref 30.0–36.0)
MCV: 93.1 fL (ref 80.0–100.0)
Monocytes Absolute: 1.1 10*3/uL — ABNORMAL HIGH (ref 0.1–1.0)
Monocytes Relative: 7 %
Neutro Abs: 12.5 10*3/uL — ABNORMAL HIGH (ref 1.7–7.7)
Neutrophils Relative %: 78 %
Platelets: 232 10*3/uL (ref 150–400)
RBC: 3.78 MIL/uL — ABNORMAL LOW (ref 3.87–5.11)
RDW: 14.1 % (ref 11.5–15.5)
WBC: 15.9 10*3/uL — ABNORMAL HIGH (ref 4.0–10.5)
nRBC: 0 % (ref 0.0–0.2)

## 2020-08-23 LAB — BASIC METABOLIC PANEL
Anion gap: 11 (ref 5–15)
BUN: 16 mg/dL (ref 8–23)
CO2: 21 mmol/L — ABNORMAL LOW (ref 22–32)
Calcium: 8.8 mg/dL — ABNORMAL LOW (ref 8.9–10.3)
Chloride: 102 mmol/L (ref 98–111)
Creatinine, Ser: 0.75 mg/dL (ref 0.44–1.00)
GFR, Estimated: >60 mL/min (ref >60)
Glucose, Bld: 126 mg/dL — ABNORMAL HIGH (ref 70–99)
Potassium: 3.6 mmol/L (ref 3.5–5.1)
Sodium: 134 mmol/L — ABNORMAL LOW (ref 135–145)

## 2020-08-23 LAB — URINE CULTURE: Culture: 40000 — AB

## 2020-08-23 LAB — MAGNESIUM: Magnesium: 2 mg/dL (ref 1.7–2.4)

## 2020-08-23 LAB — GLUCOSE, CAPILLARY
Glucose-Capillary: 156 mg/dL — ABNORMAL HIGH (ref 70–99)
Glucose-Capillary: 181 mg/dL — ABNORMAL HIGH (ref 70–99)
Glucose-Capillary: 129 mg/dL — ABNORMAL HIGH (ref 70–99)
Glucose-Capillary: 157 mg/dL — ABNORMAL HIGH (ref 70–99)

## 2020-08-23 MED ORDER — QUETIAPINE FUMARATE 25 MG PO TABS
25.0000 mg | ORAL_TABLET | Freq: Every day | ORAL | Status: DC
  Administered 2020-08-23 – 2020-08-29 (×7): 25 mg via ORAL
  Filled 2020-08-23 (×7): qty 1

## 2020-08-23 MED ORDER — OXYCODONE-ACETAMINOPHEN 5-325 MG PO TABS
0.5000 | ORAL_TABLET | ORAL | Status: DC | PRN
Start: 2020-08-23 — End: 2020-08-30
  Filled 2020-08-23: qty 1

## 2020-08-23 MED ORDER — CEPHALEXIN 250 MG PO CAPS
250.0000 mg | ORAL_CAPSULE | Freq: Four times a day (QID) | ORAL | Status: AC
  Administered 2020-08-23 – 2020-08-25 (×8): 250 mg via ORAL
  Filled 2020-08-23 (×11): qty 1

## 2020-08-23 NOTE — Progress Notes (Signed)
PROGRESS NOTE    [name]  WRU:[id number] DOB: 21-Apr-1921 DOA: 08/21/2020 PCP: [name], MD   Follow-up on sepsis. Brief Narrative:  [name] a 87 y.o.femalewith medical history significant ofhypertension, GERD, depression, stomach ulcer, GI bleeding, breast cancer (s/p of left mastectomy), remote colon cancer (s/p for colectomy), arthritis, recurrent UTI, who presents with fall, altered mental status and increased urinary frequency.  Per patient daughter, patient was very confused with hallucination, she wandered off home, had a fall.  X-ray showed a pelvic fracture, no surgical indication.  Patient was also had a severe lactic acidosis of 11.0.  Leukocytosis of 28.  She was given IV fluid in the emergency room, she was covered with meropenem for UTI. 3/28.  Culture showed Klebsiella pneumonia, antibiotics switched to Keflex based on susceptibility.     Assessment & Plan:   Principal Problem:   Severe sepsis with septic shock [facility]) Active Problems:   Hypertension   GERD (gastroesophageal reflux disease)   Recurrent UTI   Depression   Acute metabolic encephalopathy   Fall at home, initial encounter   Pelvic ring fracture (HCC)   Hypokalemia   Hyperglycemia  #1.  Severe sepsis with septic shock secondary to urinary tract infection. Urinary tract infection. Acute metabolic encephalopathy with baseline  Patient slept well last night, he woke up this morning very confused with some agitation.  At this point, UTI seems to be improving.  Mental status probably due to delirium in the hospital setting, she is also on oral pain medicine.  I will start lower dose Seroquel at 25 mg every evening.  Patient not be able to discharge due to worsening mental status today. Urine culture came back with Klebsiella pneumonia.  Antibiotics switched to Keflex based on susceptibility.  #2 pure essential hypertension plan Continue metoprolol.  3.  Pelvic fracture with a  fall. Mechanical fall. Pain medicine may play a rule in patient altered mental status.  Will discontinue any IV pain medicine, reduce the dose of oral pain medicine.   DVT prophylaxis: Lovenox Code Status: Full Family Communication: Son updated at bedside. Disposition Plan:  .   Status is: Inpatient  Remains inpatient appropriate because:Inpatient level of care appropriate due to severity of illness   Dispo: The patient is from: Home              Anticipated d/c is to: SNF              Patient currently is not medically stable to d/c.   Difficult to place patient No        I/O last 3 completed shifts: In: 70 [P.O.:870; IV Piggyback:100] Out: 400 [Urine:400] Total I/O In: 240 [P.O.:240] Out: -      Consultants:   Orthopedics  Procedures: None  Antimicrobials: Keflex  Subjective: Patient slept relatively well last night, however, she woke up this morning very confused with some agitation. She does not have a fever chills. She does not have any abdominal pain or nausea vomiting. She denies any short of breath or cough. She has no chest pain or palpitation.  Objective: Vitals:   08/22/20 1541 08/22/20 2029 08/23/20 0439 08/23/20 0900  BP: (!) 153/85 (!) 153/79 (!) 159/85   Pulse: (!) 107 94 91   Resp: 16 (!) 25 20   Temp: 97.9 F (36.6 C) 97.8 F (36.6 C) 98.7 F (37.1 C)   TempSrc:  Oral    SpO2: 90% 90% 92%   Weight:  75.8 kg  Height:        Intake/Output Summary (Last 24 hours) at 08/23/2020 1219 Last data filed at 08/23/2020 1023 Gross per 24 hour  Intake 820 ml  Output -  Net 820 ml   Filed Weights   08/21/20 0727 08/21/20 1627 08/23/20 0900  Weight: 65.8 kg 71.9 kg 75.8 kg    Examination:  General exam: Appears calm and comfortable  Respiratory system: Clear to auscultation. Respiratory effort normal. Cardiovascular system: S1 & S2 heard, RRR. No JVD, murmurs, rubs, gallops or clicks. No pedal edema. Gastrointestinal system:  Abdomen is nondistended, soft and nontender. No organomegaly or masses felt. Normal bowel sounds heard. Central nervous system: Alert and oriented x2. No focal neurological deficits. Extremities: Symmetric 5 x 5 power. Skin: No rashes, lesions or ulcers Psychiatry: Mood & affect appropriate.     Data Reviewed: I have personally reviewed following labs and imaging studies  CBC: Recent Labs  Lab 08/21/20 0738 08/22/20 0545 08/23/20 0259  WBC 28.1* 15.7* 15.9*  NEUTROABS 24.0*  --  12.5*  HGB 14.0 11.9* 11.8*  HCT 43.0 35.2* 35.2*  MCV 95.8 92.4 93.1  PLT 300 217 875   Basic Metabolic Panel: Recent Labs  Lab 08/21/20 0738 08/22/20 0545 08/23/20 0259  NA 138 137 134*  K 3.4* 4.1 3.6  CL 105 106 102  CO2 19* 22 21*  GLUCOSE 279* 140* 126*  BUN 17 13 16   CREATININE 0.90 0.73 0.75  CALCIUM 8.7* 8.7* 8.8*  MG 2.2  --  2.0   GFR: Estimated Creatinine Clearance: 39.2 mL/min (by C-G formula based on SCr of 0.75 mg/dL). Liver Function Tests: Recent Labs  Lab 08/21/20 0738  AST 29  ALT 21  ALKPHOS 68  BILITOT 1.6*  PROT 7.3  ALBUMIN 3.9   Recent Labs  Lab 08/21/20 0738  LIPASE 27   No results for input(s): AMMONIA in the last 168 hours. Coagulation Profile: Recent Labs  Lab 08/21/20 0738  INR 1.3*   Cardiac Enzymes: Recent Labs  Lab 08/21/20 0738  CKTOTAL 148   BNP (last 3 results) No results for input(s): PROBNP in the last 8760 hours. HbA1C: Recent Labs    08/21/20 0738  HGBA1C 6.4*   CBG: Recent Labs  Lab 08/22/20 0743 08/22/20 1201 08/22/20 1633 08/22/20 2032 08/23/20 0727  GLUCAP 122* 156* 185* 178* 156*   Lipid Profile: No results for input(s): CHOL, HDL, LDLCALC, TRIG, CHOLHDL, LDLDIRECT in the last 72 hours. Thyroid Function Tests: No results for input(s): TSH, T4TOTAL, FREET4, T3FREE, THYROIDAB in the last 72 hours. Anemia Panel: No results for input(s): VITAMINB12, FOLATE, FERRITIN, TIBC, IRON, RETICCTPCT in the last 72  hours. Sepsis Labs: Recent Labs  Lab 08/21/20 0738 08/21/20 1013 08/21/20 1135 08/21/20 1628  PROCALCITON <0.10  --   --   --   LATICACIDVEN 6.4* >11.0* 4.5* 3.4*    Recent Results (from the past 240 hour(s))  Resp Panel by RT-PCR (Flu A&B, Covid) Nasopharyngeal Swab     Status: None   Collection Time: 08/21/20  7:38 AM   Specimen: Nasopharyngeal Swab; Nasopharyngeal(NP) swabs in vial transport medium  Result Value Ref Range Status   SARS Coronavirus 2 by RT PCR NEGATIVE NEGATIVE Final    Comment: (NOTE) SARS-CoV-2 target nucleic acids are NOT DETECTED.  The SARS-CoV-2 RNA is generally detectable in upper respiratory specimens during the acute phase of infection. The lowest concentration of SARS-CoV-2 viral copies this assay can detect is 138 copies/mL. A negative result  does not preclude SARS-Cov-2 infection and should not be used as the sole basis for treatment or other patient management decisions. A negative result may occur with  improper specimen collection/handling, submission of specimen other than nasopharyngeal swab, presence of viral mutation(s) within the areas targeted by this assay, and inadequate number of viral copies(<138 copies/mL). A negative result must be combined with clinical observations, patient history, and epidemiological information. The expected result is Negative.  Fact Sheet for Patients:  EntrepreneurPulse.com.au  Fact Sheet for Healthcare Providers:  IncredibleEmployment.be  This test is no t yet approved or cleared by the [place] FDA and  has been authorized for detection and/or diagnosis of SARS-CoV-2 by FDA under an Emergency Use Authorization (EUA). This EUA will remain  in effect (meaning this test can be used) for the duration of the COVID-19 declaration under Section 564(b)(1) of the Act, 21 U.S.C.section 360bbb-3(b)(1), unless the authorization is terminated  or revoked sooner.        Influenza A by PCR NEGATIVE NEGATIVE Final   Influenza B by PCR NEGATIVE NEGATIVE Final    Comment: (NOTE) The Xpert Xpress SARS-CoV-2/FLU/RSV plus assay is intended as an aid in the diagnosis of influenza from Nasopharyngeal swab specimens and should not be used as a sole basis for treatment. Nasal washings and aspirates are unacceptable for Xpert Xpress SARS-CoV-2/FLU/RSV testing.  Fact Sheet for Patients: EntrepreneurPulse.com.au  Fact Sheet for Healthcare Providers: IncredibleEmployment.be  This test is not yet approved or cleared by the [place] FDA and has been authorized for detection and/or diagnosis of SARS-CoV-2 by FDA under an Emergency Use Authorization (EUA). This EUA will remain in effect (meaning this test can be used) for the duration of the COVID-19 declaration under Section 564(b)(1) of the Act, 21 U.S.C. section 360bbb-3(b)(1), unless the authorization is terminated or revoked.  Performed at [facility], [address] [place], [place] [address]   Blood Culture (routine x 2)     Status: None (Preliminary result)   Collection Time: 08/21/20  7:38 AM   Specimen: BLOOD  Result Value Ref Range Status   Specimen Description BLOOD RIGHT ASSIST CONTROL  Final   Special Requests   Final    BOTTLES DRAWN AEROBIC AND ANAEROBIC Blood Culture results may not be optimal due to an excessive volume of blood received in culture bottles   Culture   Final    NO GROWTH 2 DAYS Performed at [facility], [address], [place], [place] [address]    Report Status PENDING  Incomplete  Blood Culture (routine x 2)     Status: None (Preliminary result)   Collection Time: 08/21/20  7:38 AM   Specimen: BLOOD  Result Value Ref Range Status   Specimen Description BLOOD LEFT ASSIST CONTROL  Final   Special Requests   Final    BOTTLES DRAWN AEROBIC AND ANAEROBIC Blood Culture results may not be optimal due to an excessive  volume of blood received in culture bottles   Culture   Final    NO GROWTH 2 DAYS Performed at [facility], [address], [place], [place] [address]    Report Status PENDING  Incomplete  Urine culture     Status: Abnormal   Collection Time: 08/21/20 10:13 AM   Specimen: Urine, Random  Result Value Ref Range Status   Specimen Description   Final    URINE, RANDOM Performed at [facility], [address], [place], [place] [address]    Special Requests  Final    NONE Performed at [facility], [place], [place] [address]    Culture 40,000 COLONIES/mL KLEBSIELLA PNEUMONIAE (A)  Final   Report Status 08/23/2020 FINAL  Final   Organism ID, Bacteria KLEBSIELLA PNEUMONIAE (A)  Final      Susceptibility   Klebsiella pneumoniae - MIC*    AMPICILLIN >=32 RESISTANT Resistant     CEFAZOLIN <=4 SENSITIVE Sensitive     CEFEPIME <=0.12 SENSITIVE Sensitive     CEFTRIAXONE 0.5 SENSITIVE Sensitive     CIPROFLOXACIN <=0.25 SENSITIVE Sensitive     GENTAMICIN <=1 SENSITIVE Sensitive     IMIPENEM <=0.25 SENSITIVE Sensitive     NITROFURANTOIN 32 SENSITIVE Sensitive     TRIMETH/SULFA <=20 SENSITIVE Sensitive     AMPICILLIN/SULBACTAM 4 SENSITIVE Sensitive     PIP/TAZO <=4 SENSITIVE Sensitive     * 40,000 COLONIES/mL KLEBSIELLA PNEUMONIAE         Radiology Studies: No results found.      Scheduled Meds: . vitamin C  1,000 mg Oral Daily  . cephALEXin  250 mg Oral Q6H  . cholecalciferol  2,000 Units Oral Daily  . DULoxetine  30 mg Oral Daily  . enoxaparin (LOVENOX) injection  40 mg Subcutaneous Q24H  . insulin aspart  0-5 Units Subcutaneous QHS  . insulin aspart  0-9 Units Subcutaneous TID WC  . metoprolol succinate  25 mg Oral Daily  . pantoprazole  40 mg Oral Daily  . pneumococcal 23 valent vaccine  0.5 mL Intramuscular Tomorrow-1000  . QUEtiapine  25 mg Oral QHS  . vitamin B-12  1,000 mcg Oral Daily   Continuous Infusions:   LOS:  2 days    Time spent: 32 minutes    Sharen Hones, MD [facility]   To contact the attending provider between 7A-7P or the covering provider during after hours 7P-7A, please log into the web site [email or web address] and access using universal [place] password for that web site. If you do not have the password, please call the hospital operator.  08/23/2020, 12:19 PM

## 2020-08-23 NOTE — Progress Notes (Signed)
Occupational Therapy Treatment Patient Details Name: [name] MRN: [id number] DOB: 10-Aug-1920 Today's Date: 08/23/2020    History of present illness Patient is a 87 y.o. female with medical history significant of hypertension, GERD, depression, stomach ulcer, GI bleeding, breast cancer (s/p of left mastectomy), remote colon cancer (s/p for colectomy), arthritis, recurrent UTI, who presents with fall, altered mental status and increased urinary frequency. Found to have nondisplaced fractures involving the right superior and inferior pubic rami, nondisplaced bilateral sacral ala fractures chronic appearing L4 compression fracture, possible right 7th rib FX   OT comments  Pt seen for OT treatment on this date. Upon arrival to room, pt transferring to [facility] with CNA. Pt agreeable to OT/PT co-treatment. Pt oriented to self and was aware that she was in a hospital (although thought she was at [facility]). Pt reported no pain this date. Pt currently presents with decreased strength, balance, and activity tolerance, and requires MOD Ax2 for stand pivot transfers to/from [facility], MIN A for seated toileting hygiene, SUPERVISION/SET-UP for seated grooming tasks, and MOD A for bed mobility. Pt is making good progress toward goals and continues to benefit from skilled OT services to maximize return to PLOF and minimize risk of future falls, injury, caregiver burden, and readmission. Will continue to follow POC. Discharge recommendation remains appropriate.    Follow Up Recommendations  SNF;Supervision/Assistance - 24 hour    Equipment Recommendations  Other (comment) (defer to next venue of care)       Precautions / Restrictions Precautions Precautions: Fall Restrictions Weight Bearing Restrictions: Yes Other Position/Activity Restrictions: WBAT       Mobility Bed Mobility Overal bed mobility: Needs Assistance Bed Mobility: Sit to Supine       Sit to supine: Mod assist   General bed mobility  comments: MOD A for LE management    Transfers Overall transfer level: Needs assistance Equipment used: Rolling walker (2 wheeled) Transfers: Sit to/from [facility] Sit to Stand: Max assist Stand pivot transfers: Mod assist;+2 physical assistance            Balance Overall balance assessment: Needs assistance Sitting-balance support: Bilateral upper extremity supported;Feet supported Sitting balance-Leahy Scale: Good Sitting balance - Comments: Able to lean laterally to perform sitting toilet hygiene; no LOB observed     Standing balance-Leahy Scale: Poor Standing balance comment: UE reliance on RW                           ADL either performed or assessed with clinical judgement   ADL Overall ADL's : Needs assistance/impaired     Grooming: Wash/dry hands;Supervision/safety;Set up;Sitting           Upper Body Dressing : Minimal assistance;Bed level Upper Body Dressing Details (indicate cue type and reason): to don/doff hospital gown. MIN A for sequencing sleeves of hospital gown     Toilet Transfer: Minimal assistance;+2 for physical assistance;Stand-pivot;BSC;RW   Toileting- Clothing Manipulation and Hygiene: Minimal assistance;Sitting/lateral lean Toileting - Clothing Manipulation Details (indicate cue type and reason): MIN A for thoroughness of toilet hygiene     Functional mobility during ADLs: Maximal assistance;+2 for physical assistance;Rolling walker                 Cognition Arousal/Alertness: Awake/alert Behavior During Therapy: WFL for tasks assessed/performed Overall Cognitive Status: Impaired/Different from baseline Area of Impairment: Memory;Orientation                 Orientation Level: Disoriented  to;Time;Place;Situation   Memory: Decreased short-term memory         General Comments: Pt pleasant and agreeable throughout. MIN verbal cues to redirect pt to task at hand              General  Comments HR 120 and SpO2 88-90% following stand pivot transfer; returned to baseline following cessation of activity    Pertinent Vitals/ Pain       Pain Assessment: No/denies pain   Frequency  Min 2X/week        Progress Toward Goals  OT Goals(current goals can now be found in the care plan section)  Progress towards OT goals: Progressing toward goals  Acute Rehab OT Goals Patient Stated Goal: to go home OT Goal Formulation: With patient Time For Goal Achievement: 09/05/20 Potential to Achieve Goals: [place] Discharge plan remains appropriate;Frequency remains appropriate    Co-evaluation    PT/OT/SLP Co-Evaluation/Treatment: Yes Reason for Co-Treatment: Complexity of the patient's impairments (multi-system involvement);For patient/therapist safety PT goals addressed during session: Mobility/safety with mobility OT goals addressed during session: ADL's and self-care      AM-PAC OT "6 Clicks" Daily Activity     Outcome Measure   Help from another person eating meals?: None Help from another person taking care of personal grooming?: A Little Help from another person toileting, which includes using toliet, bedpan, or urinal?: A Little Help from another person bathing (including washing, rinsing, drying)?: A Lot Help from another person to put on and taking off regular upper body clothing?: A Little Help from another person to put on and taking off regular lower body clothing?: A Lot 6 Click Score: 17    End of Session Equipment Utilized During Treatment: Gait belt;Rolling walker  OT Visit Diagnosis: Unsteadiness on feet (R26.81);Repeated falls (R29.6);Muscle weakness (generalized) (M62.81)   Activity Tolerance Patient tolerated treatment well   Patient Left in bed;with call bell/phone within reach;with bed alarm set;with family/visitor present   Nurse Communication Mobility status        Time: 7035-0093 OT Time Calculation (min): 26 min  Charges: OT  General Charges $OT Visit: 1 Visit OT Treatments $Self Care/Home Management : 8-22 mins   Fredirick Maudlin, OTR/L [place]

## 2020-08-23 NOTE — Progress Notes (Signed)
Physical Therapy Treatment Patient Details Name: SALLY-ANN CUTBIRTH MRN: [id number] DOB: February 16, 1921 Today's Date: 08/23/2020    History of Present Illness Patient is a 87 y.o. female with medical history significant of hypertension, GERD, depression, stomach ulcer, GI bleeding, breast cancer (s/p of left mastectomy), remote colon cancer (s/p for colectomy), arthritis, recurrent UTI, who presents with fall, altered mental status and increased urinary frequency. Found to have nondisplaced fractures involving the right superior and inferior pubic rami, nondisplaced bilateral sacral ala fractures chronic appearing L4 compression fracture, possible right 7th rib FX    PT Comments    Patient with increased activity tolerance this session. No pain reported with mobility efforts today. Patient required +2 person assistance for pivot transfer from bed side commode back to bed. Verbal cues for LE and rolling walker sequencing. Limited standing tolerance for progression of ambulation at this time. Recommend to continue PT to maximize independence and decrease caregiver burden at discharge. SNF recommended for ongoing rehab efforts.    Follow Up Recommendations  SNF     Equipment Recommendations  Other (comment) (to be determined at next level of care)    Recommendations for Other Services       Precautions / Restrictions Precautions Precautions: Fall Restrictions Weight Bearing Restrictions: Yes Other Position/Activity Restrictions: WBAT    Mobility  Bed Mobility Overal bed mobility: Needs Assistance Bed Mobility: Sit to Supine       Sit to supine: Mod assist   General bed mobility comments: assistance for LE support. verbal cues for sequending and task segmentation. extra time required to complete tasks. no pain reported with activity this session    Transfers Overall transfer level: Needs assistance Equipment used: Rolling walker (2 wheeled) Transfers: Sit to/from [employer] Sit to Stand: Max assist Stand pivot transfers: Mod assist;+2 physical assistance       General transfer comment: patient sitting up on the bed side commode with nurse aide present on arrival to room. Max A for sit to stand transfer from bed side commode. verbal cues for technique. +2 person assistance required for pivoting to bed. maximal cues for LE and rolling walker sequencing  Ambulation/Gait             General Gait Details: unable to stand long enough to progress ambulation efforts during this session. limited by generalized weakness   Stairs             Wheelchair Mobility    Modified Rankin (Stroke Patients Only)       Balance Overall balance assessment: Needs assistance Sitting-balance support: Bilateral upper extremity supported;Feet supported Sitting balance-Leahy Scale: Good Sitting balance - Comments: Able to lean laterally to perform sitting toilet hygiene; no LOB observed   Standing balance support: Bilateral upper extremity supported Standing balance-Leahy Scale: Poor Standing balance comment: heavily reliance on rolling walker for support in standing and assistance reuqired to maintain standing balance                            Cognition Arousal/Alertness: Awake/alert Behavior During Therapy: WFL for tasks assessed/performed Overall Cognitive Status: Impaired/Different from baseline Area of Impairment: Memory;Orientation                 Orientation Level: Disoriented to;Time;Place;Situation   Memory: Decreased short-term memory         General Comments: patient cooperative during session but has confusion and needs re-orienting. patient following single step commands consistently with extra  time and is [facility]      Exercises      General Comments General comments (skin integrity, edema, etc.): heart rate up to 120 bpm during activity. Sp02 99-90% on room air intially after activity      Pertinent  Vitals/Pain Pain Assessment: No/denies pain    Home Living                      Prior Function            PT Goals (current goals can now be found in the care plan section) Acute Rehab PT Goals Patient Stated Goal: to go home PT Goal Formulation: With patient/family Time For Goal Achievement: 09/05/20 Potential to Achieve Goals: Fair Progress towards PT goals: Progressing toward goals    Frequency    Min 2X/week      PT Plan Current plan remains appropriate    Co-evaluation PT/OT/SLP Co-Evaluation/Treatment: Yes Reason for Co-Treatment: Complexity of the patient's impairments (multi-system involvement) PT goals addressed during session: Mobility/safety with mobility OT goals addressed during session: ADL's and self-care      AM-PAC PT "6 Clicks" Mobility   Outcome Measure  Help needed turning from your back to your side while in a flat bed without using bedrails?: A Lot Help needed moving from lying on your back to sitting on the side of a flat bed without using bedrails?: A Lot Help needed moving to and from a bed to a chair (including a wheelchair)?: A Lot Help needed standing up from a chair using your arms (e.g., wheelchair or bedside chair)?: A Lot Help needed to walk in hospital room?: Total Help needed climbing 3-5 steps with a railing? : Total 6 Click Score: 10    End of Session Equipment Utilized During Treatment: Gait belt Activity Tolerance: Patient limited by fatigue Patient left: in bed;with call bell/phone within reach;with bed alarm set;with family/visitor present;with SCD's reapplied (son present in the room) Nurse Communication: Mobility status (discussed with nurse aide) PT Visit Diagnosis: Muscle weakness (generalized) (M62.81);Difficulty in walking, not elsewhere classified (R26.2)     Time: 5859-2924 PT Time Calculation (min) (ACUTE ONLY): 31 min  Charges:  $Therapeutic Activity: 8-22 mins                     Minna Merritts,  PT, MPT    Percell Locus 08/23/2020, 1:30 PM

## 2020-08-24 DIAGNOSIS — S32810A Multiple fractures of pelvis with stable disruption of pelvic ring, initial encounter for closed fracture: Secondary | ICD-10-CM | POA: Diagnosis not present

## 2020-08-24 DIAGNOSIS — G9341 Metabolic encephalopathy: Secondary | ICD-10-CM | POA: Diagnosis not present

## 2020-08-24 DIAGNOSIS — A419 Sepsis, unspecified organism: Secondary | ICD-10-CM | POA: Diagnosis not present

## 2020-08-24 DIAGNOSIS — N390 Urinary tract infection, site not specified: Secondary | ICD-10-CM | POA: Diagnosis not present

## 2020-08-24 LAB — CBC WITH DIFFERENTIAL/PLATELET
Abs Immature Granulocytes: 0.1 10*3/uL — ABNORMAL HIGH (ref 0.00–0.07)
Basophils Absolute: 0.1 10*3/uL (ref 0.0–0.1)
Basophils Relative: 0 %
Eosinophils Absolute: 0.4 10*3/uL (ref 0.0–0.5)
Eosinophils Relative: 3 %
HCT: 36.5 % (ref 36.0–46.0)
Hemoglobin: 12.1 g/dL (ref 12.0–15.0)
Immature Granulocytes: 1 %
Lymphocytes Relative: 9 %
Lymphs Abs: 1.2 10*3/uL (ref 0.7–4.0)
MCH: 30.9 pg (ref 26.0–34.0)
MCHC: 33.2 g/dL (ref 30.0–36.0)
MCV: 93.1 fL (ref 80.0–100.0)
Monocytes Absolute: 1 10*3/uL (ref 0.1–1.0)
Monocytes Relative: 8 %
Neutro Abs: 10.3 10*3/uL — ABNORMAL HIGH (ref 1.7–7.7)
Neutrophils Relative %: 79 %
Platelets: 226 10*3/uL (ref 150–400)
RBC: 3.92 MIL/uL (ref 3.87–5.11)
RDW: 13.9 % (ref 11.5–15.5)
WBC: 13 10*3/uL — ABNORMAL HIGH (ref 4.0–10.5)
nRBC: 0 % (ref 0.0–0.2)

## 2020-08-24 LAB — GLUCOSE, CAPILLARY
Glucose-Capillary: 174 mg/dL — ABNORMAL HIGH (ref 70–99)
Glucose-Capillary: 141 mg/dL — ABNORMAL HIGH (ref 70–99)
Glucose-Capillary: 119 mg/dL — ABNORMAL HIGH (ref 70–99)
Glucose-Capillary: 120 mg/dL — ABNORMAL HIGH (ref 70–99)

## 2020-08-24 LAB — BASIC METABOLIC PANEL
Anion gap: 9 (ref 5–15)
BUN: 19 mg/dL (ref 8–23)
CO2: 23 mmol/L (ref 22–32)
Calcium: 8.7 mg/dL — ABNORMAL LOW (ref 8.9–10.3)
Chloride: 104 mmol/L (ref 98–111)
Creatinine, Ser: 0.71 mg/dL (ref 0.44–1.00)
GFR, Estimated: >60 mL/min (ref >60)
Glucose, Bld: 155 mg/dL — ABNORMAL HIGH (ref 70–99)
Potassium: 3.5 mmol/L (ref 3.5–5.1)
Sodium: 136 mmol/L (ref 135–145)

## 2020-08-24 LAB — MAGNESIUM: Magnesium: 2 mg/dL (ref 1.7–2.4)

## 2020-08-24 NOTE — NC FL2 (Signed)
[place] LEVEL OF CARE SCREENING TOOL     IDENTIFICATION  Patient Name: [name] Birthdate: 1920/10/08 Sex: female Admission Date (Current Location): 08/21/2020  [place] and [place] Number:  Engineering geologist and Address:  [facility], [address], [place], [place] [address]      Provider Number: [id number]  Attending Physician Name and Address:  Sharen Hones, MD  Relative Name and Phone Number:  Anibal Henderson [phone]    Current Level of Care: Hospital Recommended Level of Care: [place] Prior Approval Number:    Date Approved/Denied:   PASRR Number: [id number]  Discharge Plan: SNF    Current Diagnoses: Patient Active Problem List   Diagnosis Date Noted  . Severe sepsis with septic shock ([place]) 08/21/2020  . Depression 08/21/2020  . Acute metabolic encephalopathy 01/60/1093  . Fall at home, initial encounter 08/21/2020  . Pelvic ring fracture ([place]) 08/21/2020  . Hypokalemia 08/21/2020  . Hyperglycemia 08/21/2020  . Functional diarrhea 07/29/2019  . Open wound of left knee, leg, and ankle with complication 23/55/7322  . Chest wall pain 10/08/2017  . History of recurrent urinary tract infection 02/03/2017  . Vaginal pessary in situ 02/03/2017  . Iron deficiency anemia due to chronic blood loss 12/01/2016  . Personal history of malignant neoplasm of breast 11/09/2016  . GI bleed 08/21/2016  . Hereditary and idiopathic peripheral neuropathy 09/09/2015  . Malignant neoplasm of left female breast ([place]) 09/09/2015  . Prolapse of vaginal wall with midline cystocele 08/10/2015  . Incomplete bladder emptying 08/10/2015  . Midline low back pain without sciatica 08/10/2015  . Fecal incontinence 08/10/2015  . Recurrent UTI 07/19/2015  . Atrophic vaginitis 07/19/2015  . Adenomatous colon polyp 02/24/2015  . Anemia 02/24/2015  . Chronic sinusitis 02/24/2015  . Diverticulosis 02/24/2015  . Esophagitis  02/24/2015  . GERD (gastroesophageal reflux disease) 02/24/2015  . Hiatal hernia 02/24/2015  . Breast cancer, left breast ([place]) 09/05/2012  . Hypertension     Orientation RESPIRATION BLADDER Height & Weight     Self  Normal Continent Weight: 73 kg Height:  5\' 3"  (160 cm)  BEHAVIORAL SYMPTOMS/MOOD NEUROLOGICAL BOWEL NUTRITION STATUS      Continent Diet  AMBULATORY STATUS COMMUNICATION OF NEEDS Skin   Extensive Assist Verbally Normal                       Personal Care Assistance Level of Assistance  Bathing,Feeding,Dressing Bathing Assistance: Limited assistance Feeding assistance: Limited assistance Dressing Assistance: Limited assistance     Functional Limitations Info  Sight,Hearing,Speech Sight Info: Adequate Hearing Info: Impaired (Hard of hearing, hearing aids) Speech Info: Adequate    SPECIAL CARE FACTORS FREQUENCY  PT (By licensed PT),OT (By licensed OT)     PT Frequency: 5x week OT Frequency: 5x week            Contractures Contractures Info: Present    Additional Factors Info  Code Status,Allergies Code Status Info: DNR Allergies Info: Codeine           Current Medications (08/24/2020):  This is the current hospital active medication list Current Facility-Administered Medications  Medication Dose Route Frequency Provider Last Rate Last Admin  . acetaminophen (TYLENOL) suppository 650 mg  650 mg Rectal Q6H PRN Ivor Costa, MD      . ascorbic acid (VITAMIN C) tablet 1,000 mg  1,000 mg Oral Daily Ivor Costa, MD   1,000 mg at 08/23/20 0844  . cephALEXin (KEFLEX)  capsule 250 mg  250 mg Oral Q6H Oswald [place], RPH   250 mg at 08/24/20 2549  . cholecalciferol (VITAMIN D3) tablet 2,000 Units  2,000 Units Oral Daily Ivor Costa, MD   2,000 Units at 08/23/20 [id number]  . DULoxetine (CYMBALTA) DR capsule 30 mg  30 mg Oral Daily Ivor Costa, MD   30 mg at 08/23/20 0843  . enoxaparin (LOVENOX) injection 40 mg  40 mg Subcutaneous Q24H Sharen Hones, MD   40 mg at  08/23/20 2250  . hydrALAZINE (APRESOLINE) injection 5 mg  5 mg Intravenous Q2H PRN Ivor Costa, MD   5 mg at 08/22/20 1208  . hydrOXYzine (VISTARIL) injection 25 mg  25 mg Intramuscular Q6H PRN Ivor Costa, MD      . insulin aspart (novoLOG) injection 0-5 Units  0-5 Units Subcutaneous QHS Ivor Costa, MD      . insulin aspart (novoLOG) injection 0-9 Units  0-9 Units Subcutaneous TID WC Ivor Costa, MD   1 Units at 08/23/20 1814  . loperamide (IMODIUM) capsule 2 mg  2 mg Oral BID PRN Ivor Costa, MD      . methocarbamol (ROBAXIN) tablet 500 mg  500 mg Oral Q8H PRN Ivor Costa, MD      . metoprolol succinate (TOPROL-XL) 24 hr tablet 25 mg  25 mg Oral Daily Ivor Costa, MD   25 mg at 08/23/20 0844  . oxyCODONE-acetaminophen (PERCOCET/ROXICET) 5-325 MG per tablet 0.5 tablet  0.5 tablet Oral Q4H PRN Sharen Hones, MD      . pantoprazole (PROTONIX) EC tablet 40 mg  40 mg Oral Daily Ivor Costa, MD   40 mg at 08/23/20 0843  . pneumococcal 23 valent vaccine (PNEUMOVAX-23) injection 0.5 mL  0.5 mL Intramuscular Tomorrow-1000 Ivor Costa, MD      . QUEtiapine (SEROQUEL) tablet 25 mg  25 mg Oral QHS Sharen Hones, MD   25 mg at 08/23/20 2250  . vitamin B-12 (CYANOCOBALAMIN) tablet 1,000 mcg  1,000 mcg Oral Daily Ivor Costa, MD   1,000 mcg at 08/23/20 1583     Discharge Medications: Please see discharge summary for a list of discharge medications.  Relevant Imaging Results:  Relevant Lab Results:   Additional Information Non-displaced left hip  Kerin Salen, RN

## 2020-08-24 NOTE — Progress Notes (Signed)
IV removed per pt. NP notified. No IV present at this time.

## 2020-08-24 NOTE — Progress Notes (Signed)
PROGRESS NOTE    [name]  LJQ:[id number] DOB: 1921/03/09 DOA: 08/21/2020 PCP: [name], MD   Follow-up on sepsis. Brief Narrative:  [name] a 87 y.o.femalewith medical history significant ofhypertension, GERD, depression, stomach ulcer, GI bleeding, breast cancer (s/p of left mastectomy), remote colon cancer (s/p for colectomy), arthritis, recurrent UTI, who presents with fall, altered mental status and increased urinary frequency.Per patient daughter, patient was very confused with hallucination, she wandered off home, had a fall. X-ray showed a pelvic fracture, no surgical indication. Patient was also had a severe lactic acidosis of 11.0. Leukocytosis of 28. She was given IV fluid in the emergency room, she was covered with meropenem for UTI. 3/28.  Culture showed Klebsiella pneumonia, antibiotics switched to Keflex based on susceptibility.     Assessment & Plan:   Principal Problem:   Severe sepsis with septic shock [facility]) Active Problems:   Hypertension   GERD (gastroesophageal reflux disease)   Recurrent UTI   Depression   Acute metabolic encephalopathy   Fall at home, initial encounter   Pelvic ring fracture (HCC)   Hypokalemia   Hyperglycemia  #1. Severe sepsis with septic shock secondary to urinary tract infection. Urinary tract infection. Acute metabolic encephalopathy with baseline dementia. Patient condition is a stable now.  Blood culture came back negative.  Urine culture came back with Klebsiella.  Antibiotic switched to oral Keflex. Patient mental status also improving, he was treated with lower dose Seroquel at nighttime.  She has occasional sundowning. Currently, she is a pending for nursing home placement.  Discussed with social worker, it may take 3 or 4 days before discharge.  #2.  Essential hypertension. Continue metoprolol.  3.  Pelvic fracture. Mechanical fall. Patient was evaluated by orthopedics, no surgery needed.  I  have reduced the pain medicine to half the dose, discontinued IV pain medicine.  Currently patient doing well.     DVT prophylaxis:  Lovenox Code Status: Full Family Communication: Son updated at bedside. Disposition Plan:  .   Status is: Inpatient  Remains inpatient appropriate because:Inpatient level of care appropriate due to severity of illness   Dispo: The patient is from: Home              Anticipated d/c is to: Home              Patient currently is not medically stable to d/c.   Difficult to place patient No        I/O last 3 completed shifts: In: 65 [P.O.:720] Out: 550 [Urine:550] Total I/O In: 120 [P.O.:120] Out: -      Consultants:   None  Procedures: None  Antimicrobials:  Keflex Subjective: Spoke with the nurse nurse, patient was restless last night, but was able to sleep.  She did not have significant confusion this morning.  No hallucination. She denies any abdominal pain or nausea vomiting. No fever chills  No dysuria hematuria.   Objective: Vitals:   08/23/20 2000 08/24/20 0500 08/24/20 0835 08/24/20 1022  BP: (!) 156/78 (!) 164/82 118/72   Pulse: 98 (!) 102 93   Resp: 18 18 18    Temp: 98.1 F (36.7 C) 97.7 F (36.5 C) 98 F (36.7 C)   TempSrc: Oral Oral Oral   SpO2: 94% 96%    Weight:  73 kg  75.7 kg  Height:        Intake/Output Summary (Last 24 hours) at 08/24/2020 1251 Last data filed at 08/24/2020 1000 Gross per 24 hour  Intake 360 ml  Output 550 ml  Net -190 ml   Filed Weights   08/23/20 0900 08/24/20 0500 08/24/20 1022  Weight: 75.8 kg 73 kg 75.7 kg    Examination:  General exam: Appears calm and comfortable, frail Respiratory system: Clear to auscultation. Respiratory effort normal. Cardiovascular system: S1 & S2 heard, RRR. No JVD, murmurs, rubs, gallops or clicks. No pedal edema. Gastrointestinal system: Abdomen is nondistended, soft and nontender. No organomegaly or masses felt. Normal bowel sounds  heard. Central nervous system: Alert and oriented.x1 No focal neurological deficits. Extremities: Symmetric . Skin: No rashes, lesions or ulcers Psychiatry: Mood & affect appropriate.     Data Reviewed: I have personally reviewed following labs and imaging studies  CBC: Recent Labs  Lab 08/21/20 0738 08/22/20 0545 08/23/20 0259 08/24/20 0623  WBC 28.1* 15.7* 15.9* 13.0*  NEUTROABS 24.0*  --  12.5* 10.3*  HGB 14.0 11.9* 11.8* 12.1  HCT 43.0 35.2* 35.2* 36.5  MCV 95.8 92.4 93.1 93.1  PLT 300 217 232 443   Basic Metabolic Panel: Recent Labs  Lab 08/21/20 0738 08/22/20 0545 08/23/20 0259 08/24/20 0623  NA 138 137 134* 136  K 3.4* 4.1 3.6 3.5  CL 105 106 102 104  CO2 19* 22 21* 23  GLUCOSE 279* 140* 126* 155*  BUN 17 13 16 19   CREATININE 0.90 0.73 0.75 0.71  CALCIUM 8.7* 8.7* 8.8* 8.7*  MG 2.2  --  2.0 2.0   GFR: Estimated Creatinine Clearance: 39.2 mL/min (by C-G formula based on SCr of 0.71 mg/dL). Liver Function Tests: Recent Labs  Lab 08/21/20 0738  AST 29  ALT 21  ALKPHOS 68  BILITOT 1.6*  PROT 7.3  ALBUMIN 3.9   Recent Labs  Lab 08/21/20 0738  LIPASE 27   No results for input(s): AMMONIA in the last 168 hours. Coagulation Profile: Recent Labs  Lab 08/21/20 0738  INR 1.3*   Cardiac Enzymes: Recent Labs  Lab 08/21/20 0738  CKTOTAL 148   BNP (last 3 results) No results for input(s): PROBNP in the last 8760 hours. HbA1C: No results for input(s): HGBA1C in the last 72 hours. CBG: Recent Labs  Lab 08/23/20 1240 08/23/20 1635 08/23/20 2216 08/24/20 0844 08/24/20 1120  GLUCAP 181* 129* 157* 174* 141*   Lipid Profile: No results for input(s): CHOL, HDL, LDLCALC, TRIG, CHOLHDL, LDLDIRECT in the last 72 hours. Thyroid Function Tests: No results for input(s): TSH, T4TOTAL, FREET4, T3FREE, THYROIDAB in the last 72 hours. Anemia Panel: No results for input(s): VITAMINB12, FOLATE, FERRITIN, TIBC, IRON, RETICCTPCT in the last 72  hours. Sepsis Labs: Recent Labs  Lab 08/21/20 0738 08/21/20 1013 08/21/20 1135 08/21/20 1628  PROCALCITON <0.10  --   --   --   LATICACIDVEN 6.4* >11.0* 4.5* 3.4*    Recent Results (from the past 240 hour(s))  Resp Panel by RT-PCR (Flu A&B, Covid) Nasopharyngeal Swab     Status: None   Collection Time: 08/21/20  7:38 AM   Specimen: Nasopharyngeal Swab; Nasopharyngeal(NP) swabs in vial transport medium  Result Value Ref Range Status   SARS Coronavirus 2 by RT PCR NEGATIVE NEGATIVE Final    Comment: (NOTE) SARS-CoV-2 target nucleic acids are NOT DETECTED.  The SARS-CoV-2 RNA is generally detectable in upper respiratory specimens during the acute phase of infection. The lowest concentration of SARS-CoV-2 viral copies this assay can detect is 138 copies/mL. A negative result does not preclude SARS-Cov-2 infection and should not be used as the sole basis for treatment  or other patient management decisions. A negative result may occur with  improper specimen collection/handling, submission of specimen other than nasopharyngeal swab, presence of viral mutation(s) within the areas targeted by this assay, and inadequate number of viral copies(<138 copies/mL). A negative result must be combined with clinical observations, patient history, and epidemiological information. The expected result is Negative.  Fact Sheet for Patients:  EntrepreneurPulse.com.au  Fact Sheet for Healthcare Providers:  IncredibleEmployment.be  This test is no t yet approved or cleared by the [place] FDA and  has been authorized for detection and/or diagnosis of SARS-CoV-2 by FDA under an Emergency Use Authorization (EUA). This EUA will remain  in effect (meaning this test can be used) for the duration of the COVID-19 declaration under Section 564(b)(1) of the Act, 21 U.S.C.section 360bbb-3(b)(1), unless the authorization is terminated  or revoked sooner.        Influenza A by PCR NEGATIVE NEGATIVE Final   Influenza B by PCR NEGATIVE NEGATIVE Final    Comment: (NOTE) The Xpert Xpress SARS-CoV-2/FLU/RSV plus assay is intended as an aid in the diagnosis of influenza from Nasopharyngeal swab specimens and should not be used as a sole basis for treatment. Nasal washings and aspirates are unacceptable for Xpert Xpress SARS-CoV-2/FLU/RSV testing.  Fact Sheet for Patients: EntrepreneurPulse.com.au  Fact Sheet for Healthcare Providers: IncredibleEmployment.be  This test is not yet approved or cleared by the [place] FDA and has been authorized for detection and/or diagnosis of SARS-CoV-2 by FDA under an Emergency Use Authorization (EUA). This EUA will remain in effect (meaning this test can be used) for the duration of the COVID-19 declaration under Section 564(b)(1) of the Act, 21 U.S.C. section 360bbb-3(b)(1), unless the authorization is terminated or revoked.  Performed at [facility], [address] [place], [place] [address]   Blood Culture (routine x 2)     Status: None (Preliminary result)   Collection Time: 08/21/20  7:38 AM   Specimen: BLOOD  Result Value Ref Range Status   Specimen Description BLOOD RIGHT ASSIST CONTROL  Final   Special Requests   Final    BOTTLES DRAWN AEROBIC AND ANAEROBIC Blood Culture results may not be optimal due to an excessive volume of blood received in culture bottles   Culture   Final    NO GROWTH 3 DAYS Performed at [facility], [address], [place], [place] [address]    Report Status PENDING  Incomplete  Blood Culture (routine x 2)     Status: None (Preliminary result)   Collection Time: 08/21/20  7:38 AM   Specimen: BLOOD  Result Value Ref Range Status   Specimen Description BLOOD LEFT ASSIST CONTROL  Final   Special Requests   Final    BOTTLES DRAWN AEROBIC AND ANAEROBIC Blood Culture results may not be optimal due to an excessive  volume of blood received in culture bottles   Culture   Final    NO GROWTH 3 DAYS Performed at [facility], [address] [place], [place] [address]    Report Status PENDING  Incomplete  Urine culture     Status: Abnormal   Collection Time: 08/21/20 10:13 AM   Specimen: Urine, Random  Result Value Ref Range Status   Specimen Description   Final    URINE, RANDOM Performed at [facility], [address] [place], [place] [address]    Special Requests   Final    NONE Performed at [facility], [place],  [place], [place] [address]    Culture 40,000 COLONIES/mL KLEBSIELLA PNEUMONIAE (A)  Final   Report Status 08/23/2020 FINAL  Final   Organism ID, Bacteria KLEBSIELLA PNEUMONIAE (A)  Final      Susceptibility   Klebsiella pneumoniae - MIC*    AMPICILLIN >=32 RESISTANT Resistant     CEFAZOLIN <=4 SENSITIVE Sensitive     CEFEPIME <=0.12 SENSITIVE Sensitive     CEFTRIAXONE 0.5 SENSITIVE Sensitive     CIPROFLOXACIN <=0.25 SENSITIVE Sensitive     GENTAMICIN <=1 SENSITIVE Sensitive     IMIPENEM <=0.25 SENSITIVE Sensitive     NITROFURANTOIN 32 SENSITIVE Sensitive     TRIMETH/SULFA <=20 SENSITIVE Sensitive     AMPICILLIN/SULBACTAM 4 SENSITIVE Sensitive     PIP/TAZO <=4 SENSITIVE Sensitive     * 40,000 COLONIES/mL KLEBSIELLA PNEUMONIAE         Radiology Studies: No results found.      Scheduled Meds: . vitamin C  1,000 mg Oral Daily  . cephALEXin  250 mg Oral Q6H  . cholecalciferol  2,000 Units Oral Daily  . DULoxetine  30 mg Oral Daily  . enoxaparin (LOVENOX) injection  40 mg Subcutaneous Q24H  . insulin aspart  0-5 Units Subcutaneous QHS  . insulin aspart  0-9 Units Subcutaneous TID WC  . metoprolol succinate  25 mg Oral Daily  . pantoprazole  40 mg Oral Daily  . pneumococcal 23 valent vaccine  0.5 mL Intramuscular Tomorrow-1000  . QUEtiapine  25 mg Oral QHS  . vitamin B-12  1,000 mcg Oral Daily   Continuous Infusions:   LOS:  3 days    Time spent: 28 minutes    Sharen Hones, MD [facility]   To contact the attending provider between 7A-7P or the covering provider during after hours 7P-7A, please log into the web site [email or web address] and access using universal [place] password for that web site. If you do not have the password, please call the hospital operator.  08/24/2020, 12:51 PM

## 2020-08-24 NOTE — Progress Notes (Signed)
Physical Therapy Treatment Patient Details Name: [name] MRN: [id number] DOB: 06-17-1920 Today's Date: 08/24/2020    History of Present Illness Patient is a 87 y.o. female with medical history significant of hypertension, GERD, depression, stomach ulcer, GI bleeding, breast cancer (s/p of left mastectomy), remote colon cancer (s/p for colectomy), arthritis, recurrent UTI, who presents with fall, altered mental status and increased urinary frequency. Found to have nondisplaced fractures involving the right superior and inferior pubic rami, nondisplaced bilateral sacral ala fractures chronic appearing L4 compression fracture, possible right 7th rib FX    PT Comments    Patient seen for PT treatment. No family at the bedside. Patient sleeping on arrival to room and seems more confused that previous session. Patient had difficulty following commands for progression of out of bed activity. Max A required to achieve sitting position and minimal assistance required to maintain upright trunk. Strengthening LE exercises performed in bed.  Recommend PT continue to follow to maximize independence. Recommend SNF at discharge for ongoing PT efforts.    Follow Up Recommendations  SNF     Equipment Recommendations   (to be determined at next leve of care)    Recommendations for Other Services       Precautions / Restrictions Precautions Precautions: Fall Restrictions Weight Bearing Restrictions: Yes Other Position/Activity Restrictions: WBAT    Mobility  Bed Mobility               General bed mobility comments: did not attempt as patient having difficulty following commands, confusion.    Transfers                    Ambulation/Gait                 Stairs             Wheelchair Mobility    Modified Rankin (Stroke Patients Only)       Balance Overall balance assessment: Needs assistance Sitting-balance support: No upper extremity supported;Feet  unsupported Sitting balance-Leahy Scale: Poor Sitting balance - Comments: posterior lean in sitting position. Min A required to maintain sitting balance                                    Cognition Arousal/Alertness: Lethargic Behavior During Therapy: WFL for tasks assessed/performed Overall Cognitive Status: Impaired/Different from baseline                                 General Comments: patient was sleeping on arrival to room. patient groggy initially and then with tangential speech at times. patient needs extra time and repetition to follow single step commands      Exercises General Exercises - Lower Extremity Ankle Circles/Pumps: Strengthening;Both;AAROM;10 reps;Supine (3 sets of 10 reps) Heel Slides: AAROM;Strengthening;Both;10 reps;Supine (3 sets of 10 reps) Hip ABduction/ADduction: AAROM;Strengthening;Both;10 reps;Supine (3 sets of 10 reps)    General Comments General comments (skin integrity, edema, etc.): Sp02 95 % on room air and heart rate in the 80's      Pertinent Vitals/Pain Pain Assessment: No/denies pain    Home Living                      Prior Function            PT Goals (current goals can now be found in the  care plan section) Acute Rehab PT Goals Patient Stated Goal: to go home PT Goal Formulation: With patient/family Time For Goal Achievement: 09/05/20 Potential to Achieve Goals: Fair Progress towards PT goals: Progressing toward goals    Frequency    Min 2X/week      PT Plan Current plan remains appropriate    Co-evaluation              AM-PAC PT "6 Clicks" Mobility   Outcome Measure  Help needed turning from your back to your side while in a flat bed without using bedrails?: A Lot Help needed moving from lying on your back to sitting on the side of a flat bed without using bedrails?: A Lot Help needed moving to and from a bed to a chair (including a wheelchair)?: A Lot Help needed  standing up from a chair using your arms (e.g., wheelchair or bedside chair)?: A Lot Help needed to walk in hospital room?: Total Help needed climbing 3-5 steps with a railing? : Total 6 Click Score: 10    End of Session   Activity Tolerance: Patient limited by fatigue Patient left: in bed;with call bell/phone within reach;with bed alarm set Nurse Communication: Mobility status PT Visit Diagnosis: Muscle weakness (generalized) (M62.81);Difficulty in walking, not elsewhere classified (R26.2)     Time: 6712-4580 PT Time Calculation (min) (ACUTE ONLY): 16 min  Charges:  $Therapeutic Exercise: 8-22 mins                     Minna Merritts, PT, MPT    Percell Locus 08/24/2020, 3:20 PM

## 2020-08-24 NOTE — TOC Initial Note (Signed)
Transition of Care [facility]) - Initial/Assessment Note    Patient Details  Name: [name] MRN: [id number] Date of Birth: 1921/05/14  Transition of [facility]) CM/SW Contact:    Kerin Salen, RN Phone Number: 08/24/2020, 10:16 AM  Clinical Narrative:  TOC assessment done via phone wit POA daughter Anibal Henderson, who states patient prior to admission was living alone, alert and oriented x3, cooking for neighbors, playing with kids. However in the past couple of years health declined, some memory loss. Now cooking and shopping done by daughter and son also assist with care. Daughter assist with daily meals. Daughter receptive to SNF rehab. Given website information to review local facilities, however understands that Insurance may be a barrier and to call and get list of covering facilities. Daughter states she and her brother will do so. I also informed daughter that I will start the paperwork and the search to include [place] and surrounding areas and will contact her with acceptance information for she and her brother to decide. Daughter voices understanding. TOC to continue tracking for discharge needs.                Expected Discharge Plan: [place] Barriers to Discharge: Continued Medical Work up   Patient Goals and CMS Choice Patient states their goals for this hospitalization and ongoing recovery are:: To SNF per daughter/POA Anibal Henderson. CMS Medicare.gov Compare Post Acute Care list provided to:: Legal Guardian Choice offered to / list presented to : Adult Children  Expected Discharge Plan and Services Expected Discharge Plan: [place] In-house Referral: Clinical Social Work   Post Acute Care Choice: [place] Living arrangements for the past 2 months: [place]: NA [place] Agency: NA        Prior Living Arrangements/Services Living arrangements for the past 2 months: [place] Lives with:: Self          Need for Family Participation in Patient Care: Yes (Comment) Care giver support system in place?: Yes (comment)   Criminal Activity/Legal Involvement Pertinent to Current Situation/Hospitalization: No - Comment as needed  Activities of Daily Living Home Assistive Devices/Equipment: Environmental consultant (specify type) ADL Screening (condition at time of admission) Patient's cognitive ability adequate to safely complete daily activities?: No Is the patient deaf or have difficulty hearing?: Yes Does the patient have difficulty seeing, even when wearing glasses/contacts?: Yes Does the patient have difficulty concentrating, remembering, or making decisions?: Yes Patient able to express need for assistance with ADLs?: No Does the patient have difficulty dressing or bathing?: Yes Independently performs ADLs?: No Communication: Needs assistance Is this a change from baseline?: Change from baseline, expected to last <3 days Dressing (OT): Needs assistance Is this a change from baseline?: Change from baseline, expected to last <3days Grooming: Needs assistance Is this a change from baseline?: Change from baseline, expected to last <3 days Feeding: Needs assistance Is this a change from baseline?: Change from baseline, expected to last <3 days Bathing: Needs assistance Is this a change from baseline?: Change from baseline, expected to last <3 days Toileting: Needs assistance Is this a change from baseline?: Change from baseline, expected to last <3 days In/Out Bed: Needs assistance Is this a change from baseline?: Change from baseline, expected to last <3 days Walks in Home: Needs assistance Is this  a change from baseline?: Change from baseline, expected to last <3 days Does the patient have difficulty walking or climbing stairs?: Yes Weakness of Legs: Both Weakness of Arms/Hands: Both  Permission Sought/Granted                  Emotional  Assessment Appearance:: Appears stated age Attitude/Demeanor/Rapport: Unable to Assess Affect (typically observed): Unable to Assess   Alcohol / Substance Use: Not Applicable Psych Involvement: No (comment)  Admission diagnosis:  Severe sepsis with septic shock ([place]) [A41.9, R65.21] Fall, initial encounter [W19.XXXA] Closed nondisplaced fracture of anterior wall of left acetabulum, initial encounter ([place]) [S32.415A] Sepsis without acute organ dysfunction, due to unspecified organism [facility]) [A41.9] Patient Active Problem List   Diagnosis Date Noted  . Severe sepsis with septic shock ([place]) 08/21/2020  . Depression 08/21/2020  . Acute metabolic encephalopathy 56/38/7564  . Fall at home, initial encounter 08/21/2020  . Pelvic ring fracture ([place]) 08/21/2020  . Hypokalemia 08/21/2020  . Hyperglycemia 08/21/2020  . Functional diarrhea 07/29/2019  . Open wound of left knee, leg, and ankle with complication 33/29/5188  . Chest wall pain 10/08/2017  . History of recurrent urinary tract infection 02/03/2017  . Vaginal pessary in situ 02/03/2017  . Iron deficiency anemia due to chronic blood loss 12/01/2016  . Personal history of malignant neoplasm of breast 11/09/2016  . GI bleed 08/21/2016  . Hereditary and idiopathic peripheral neuropathy 09/09/2015  . Malignant neoplasm of left female breast ([place]) 09/09/2015  . Prolapse of vaginal wall with midline cystocele 08/10/2015  . Incomplete bladder emptying 08/10/2015  . Midline low back pain without sciatica 08/10/2015  . Fecal incontinence 08/10/2015  . Recurrent UTI 07/19/2015  . Atrophic vaginitis 07/19/2015  . Adenomatous colon polyp 02/24/2015  . Anemia 02/24/2015  . Chronic sinusitis 02/24/2015  . Diverticulosis 02/24/2015  . Esophagitis 02/24/2015  . GERD (gastroesophageal reflux disease) 02/24/2015  . Hiatal hernia 02/24/2015  . Breast cancer, left breast ([place]) 09/05/2012  . Hypertension    PCP:  [name], MD Pharmacy:    [facility] [place], [place] [address] [place] [address] Phone: [phone] Fax: [phone]     Social Determinants of Health ([place]) Interventions    Readmission Risk Interventions Readmission Risk Prevention Plan 08/24/2020  Transportation Screening Complete  PCP or Specialist Appt within 5-7 Days Complete  Home Care Screening Complete  Medication Review (RN CM) Complete  Some recent data might be hidden

## 2020-08-24 NOTE — Care Management Important Message (Signed)
Important Message  Patient Details  Name: [name] MRN: [id number] Date of Birth: November 09, 1920   Medicare Important Message Given:  Yes     Dannette Barbara 08/24/2020, 11:24 AM

## 2020-08-25 ENCOUNTER — Inpatient Hospital Stay: Payer: Medicare Other

## 2020-08-25 DIAGNOSIS — R6521 Severe sepsis with septic shock: Secondary | ICD-10-CM | POA: Diagnosis not present

## 2020-08-25 DIAGNOSIS — G9341 Metabolic encephalopathy: Secondary | ICD-10-CM | POA: Diagnosis not present

## 2020-08-25 DIAGNOSIS — S32415A Nondisplaced fracture of anterior wall of left acetabulum, initial encounter for closed fracture: Secondary | ICD-10-CM

## 2020-08-25 DIAGNOSIS — A419 Sepsis, unspecified organism: Secondary | ICD-10-CM | POA: Diagnosis not present

## 2020-08-25 LAB — GLUCOSE, CAPILLARY
Glucose-Capillary: 138 mg/dL — ABNORMAL HIGH (ref 70–99)
Glucose-Capillary: 173 mg/dL — ABNORMAL HIGH (ref 70–99)
Glucose-Capillary: 168 mg/dL — ABNORMAL HIGH (ref 70–99)
Glucose-Capillary: 128 mg/dL — ABNORMAL HIGH (ref 70–99)
Glucose-Capillary: 111 mg/dL — ABNORMAL HIGH (ref 70–99)
Glucose-Capillary: 173 mg/dL — ABNORMAL HIGH (ref 70–99)

## 2020-08-25 LAB — CBC
HCT: 34.6 % — ABNORMAL LOW (ref 36.0–46.0)
Hemoglobin: 12.3 g/dL (ref 12.0–15.0)
MCH: 32.4 pg (ref 26.0–34.0)
MCHC: 35.5 g/dL (ref 30.0–36.0)
MCV: 91.1 fL (ref 80.0–100.0)
Platelets: 272 10*3/uL (ref 150–400)
RBC: 3.8 MIL/uL — ABNORMAL LOW (ref 3.87–5.11)
RDW: 13.9 % (ref 11.5–15.5)
WBC: 11.7 10*3/uL — ABNORMAL HIGH (ref 4.0–10.5)
nRBC: 0 % (ref 0.0–0.2)

## 2020-08-25 LAB — COMPREHENSIVE METABOLIC PANEL
ALT: 16 U/L (ref 0–44)
AST: 18 U/L (ref 15–41)
Albumin: 3 g/dL — ABNORMAL LOW (ref 3.5–5.0)
Alkaline Phosphatase: 51 U/L (ref 38–126)
Anion gap: 8 (ref 5–15)
BUN: 20 mg/dL (ref 8–23)
CO2: 23 mmol/L (ref 22–32)
Calcium: 8.4 mg/dL — ABNORMAL LOW (ref 8.9–10.3)
Chloride: 103 mmol/L (ref 98–111)
Creatinine, Ser: 0.58 mg/dL (ref 0.44–1.00)
GFR, Estimated: >60 mL/min (ref >60)
Glucose, Bld: 140 mg/dL — ABNORMAL HIGH (ref 70–99)
Potassium: 3.6 mmol/L (ref 3.5–5.1)
Sodium: 134 mmol/L — ABNORMAL LOW (ref 135–145)
Total Bilirubin: 1.8 mg/dL — ABNORMAL HIGH (ref 0.3–1.2)
Total Protein: 6.2 g/dL — ABNORMAL LOW (ref 6.5–8.1)

## 2020-08-25 LAB — TROPONIN I (HIGH SENSITIVITY)
Troponin I (High Sensitivity): 20 ng/L — ABNORMAL HIGH (ref <18)
Troponin I (High Sensitivity): 19 ng/L — ABNORMAL HIGH (ref <18)

## 2020-08-25 LAB — BRAIN NATRIURETIC PEPTIDE: B Natriuretic Peptide: 131.6 pg/mL — ABNORMAL HIGH (ref 0.0–100.0)

## 2020-08-25 MED ORDER — FUROSEMIDE 10 MG/ML IJ SOLN
20.0000 mg | Freq: Once | INTRAMUSCULAR | Status: AC
  Administered 2020-08-25: 20 mg via INTRAVENOUS
  Filled 2020-08-25: qty 4

## 2020-08-25 MED ORDER — POLYETHYLENE GLYCOL 3350 17 G PO PACK
17.0000 g | PACK | Freq: Every day | ORAL | Status: DC
  Administered 2020-08-25 – 2020-08-30 (×6): 17 g via ORAL
  Filled 2020-08-25 (×6): qty 1

## 2020-08-25 MED ORDER — FUROSEMIDE 10 MG/ML IJ SOLN
40.0000 mg | Freq: Once | INTRAMUSCULAR | Status: AC
  Administered 2020-08-25: 40 mg via INTRAVENOUS
  Filled 2020-08-25: qty 4

## 2020-08-25 MED ORDER — HYDRALAZINE HCL 20 MG/ML IJ SOLN
5.0000 mg | Freq: Four times a day (QID) | INTRAMUSCULAR | Status: DC | PRN
  Administered 2020-08-25: 5 mg via INTRAVENOUS
  Filled 2020-08-25: qty 1

## 2020-08-25 MED ORDER — SENNOSIDES-DOCUSATE SODIUM 8.6-50 MG PO TABS
1.0000 | ORAL_TABLET | Freq: Every day | ORAL | Status: DC
  Administered 2020-08-25 – 2020-08-29 (×5): 1 via ORAL
  Filled 2020-08-25 (×5): qty 1

## 2020-08-25 MED ORDER — DOXYCYCLINE HYCLATE 100 MG PO TABS
100.0000 mg | ORAL_TABLET | Freq: Two times a day (BID) | ORAL | Status: DC
  Administered 2020-08-25 – 2020-08-27 (×5): 100 mg via ORAL
  Filled 2020-08-25 (×5): qty 1

## 2020-08-25 NOTE — Progress Notes (Signed)
During bedside report, patient had 40 respirations, Rapid response called, Yellow MEWS of a 3 noted along with elevated blood pressure, Dillion W, RN at the bedside, & orders received for IV Lasix and hydralzine.

## 2020-08-25 NOTE — Progress Notes (Signed)
PROGRESS NOTE  [name] TKZ:[id number] DOB: 11/17/1920 DOA: 08/21/2020 PCP: [name], MD   LOS: 4 days   Brief Narrative / Interim history: [name] a 87 y.o.femalewith medical history significant ofhypertension, GERD, depression, stomach ulcer, GI bleeding, breast cancer (s/p of left mastectomy), remote colon cancer (s/p for colectomy), arthritis, recurrent UTI, who presents with fall, altered mental status and increased urinary frequency.Per patient daughter, patient was very confused with hallucination, she wandered off home, had a fall. X-ray showed a pelvic fracture, no surgical indication. Patient was also had a severe lactic acidosis of 11.0. Leukocytosis of 28. She was given IV fluid in the emergency room, she was covered with meropenem for UTI. 3/28.Culture showed Klebsiella pneumonia, antibiotics switched to Keflex based on susceptibility.   Subjective / 24h Interval events: Complains of chest pain this morning. No nausea/vomiting. Tells me that she has had chest pain for past 3 days  Assessment & Plan: Principal Problem Acute metabolic encephalopathy due to severe sepsis with septic shock secondary to urinary tract infection. Underlying dementia -Sepsis physiology resolved.  Urine cultures showed Klebsiella.  She was initially placed on meropenem but eventually transitioned to Keflex upon speciation.  Today is day 5 of antibiotic therapy for UTI -Mental status improving, needs SNF  Active Problems SVT, RBBB -Patient had an episode of SVT this morning with heart rate as high as 170.  Resolved on its own.  During this admission also found to have new right bundle branch block.  Obtain a 2D echo, the chest x-ray done this morning shows slight fluid overload and will be given Lasix.  Continue metoprolol  Acute on chronic diastolic CHF -Likely in the setting of fluid resuscitation due to her sepsis.  Give Lasix today, updated 2D echo especially given  branch block that is new  Possible lobar pneumonia -on CXR. 3 days of doxy  Essential hypertension -Continue metoprolol.  Pelvic fracture - due to mechanical fall. Patient was evaluated by orthopedics, no surgery needed. Needs SNF, placement pending  Scheduled Meds: . vitamin C  1,000 mg Oral Daily  . cephALEXin  250 mg Oral Q6H  . cholecalciferol  2,000 Units Oral Daily  . doxycycline  100 mg Oral Q12H  . DULoxetine  30 mg Oral Daily  . enoxaparin (LOVENOX) injection  40 mg Subcutaneous Q24H  . insulin aspart  0-5 Units Subcutaneous QHS  . insulin aspart  0-9 Units Subcutaneous TID WC  . metoprolol succinate  25 mg Oral Daily  . pantoprazole  40 mg Oral Daily  . pneumococcal 23 valent vaccine  0.5 mL Intramuscular Tomorrow-1000  . QUEtiapine  25 mg Oral QHS  . vitamin B-12  1,000 mcg Oral Daily   Continuous Infusions: PRN Meds:.acetaminophen, hydrALAZINE, hydrOXYzine, loperamide, methocarbamol, oxyCODONE-acetaminophen  Diet Orders (From admission, onward)    Start     Ordered   08/21/20 1643  Diet Heart Room service appropriate? Yes; Fluid consistency: Thin  Diet effective now       Question Answer Comment  Room service appropriate? Yes   Fluid consistency: Thin      08/21/20 1642          DVT prophylaxis: enoxaparin (LOVENOX) injection 40 mg Start: 08/22/20 2200 SCDs Start: 08/22/20 1042 SCDs Start: 08/21/20 1643     Code Status: DNR  Family Communication: daughter at bedside   Status is: Inpatient  Remains inpatient appropriate because:Inpatient level of care appropriate due to severity of illness   Dispo: The patient is from:  Home              Anticipated d/c is to: SNF              Patient currently is not medically stable to d/c.   Difficult to place patient No    Level of care: Med-Surg  Consultants:  none  Procedures:  2D echo: pending  Microbiology  Urine cultures - klebsiella  Antimicrobials: Keflex Doxy    Objective: Vitals:    08/24/20 2306 08/25/20 0441 08/25/20 0740 08/25/20 1019  BP: (!) 154/87 (!) 158/83 (!) 151/76 135/65  Pulse: 81 83 90 70  Resp: 20 20 (!) 26 (!) 26  Temp: 98.2 F (36.8 C) 98.3 F (36.8 C) 98.5 F (36.9 C) 97.9 F (36.6 C)  TempSrc: Oral     SpO2: 93% 93% 93% 98%  Weight:      Height:        Intake/Output Summary (Last 24 hours) at 08/25/2020 1046 Last data filed at 08/25/2020 1006 Gross per 24 hour  Intake 720 ml  Output 600 ml  Net 120 ml   Filed Weights   08/23/20 0900 08/24/20 0500 08/24/20 1022  Weight: 75.8 kg 73 kg 75.7 kg    Examination:  Constitutional: No apparent distress, slightly tachypneic Eyes: no scleral icterus ENMT: Mucous membranes are moist.  Neck: normal, supple Respiratory: Diffuse bibasilar crackles, no wheezing.  Tachypneic Cardiovascular: Regular rate and rhythm, no murmurs / rubs / gallops.  No edema Abdomen: non distended, no tenderness. Bowel sounds positive.  Musculoskeletal: no clubbing / cyanosis.  Skin: no rashes Neurologic: CN 2-12 grossly intact. Strength 5/5 in all 4.  Psychiatric: Normal judgment and insight. Alert and oriented x 3. Normal mood.    Data Reviewed: I have independently reviewed following labs and imaging studies   CBC: Recent Labs  Lab 08/21/20 0738 08/22/20 0545 08/23/20 0259 08/24/20 0623 08/25/20 0811  WBC 28.1* 15.7* 15.9* 13.0* 11.7*  NEUTROABS 24.0*  --  12.5* 10.3*  --   HGB 14.0 11.9* 11.8* 12.1 12.3  HCT 43.0 35.2* 35.2* 36.5 34.6*  MCV 95.8 92.4 93.1 93.1 91.1  PLT 300 217 232 226 294   Basic Metabolic Panel: Recent Labs  Lab 08/21/20 0738 08/22/20 0545 08/23/20 0259 08/24/20 0623 08/25/20 0811  NA 138 137 134* 136 134*  K 3.4* 4.1 3.6 3.5 3.6  CL 105 106 102 104 103  CO2 19* 22 21* 23 23  GLUCOSE 279* 140* 126* 155* 140*  BUN 17 13 16 19 20   CREATININE 0.90 0.73 0.75 0.71 0.58  CALCIUM 8.7* 8.7* 8.8* 8.7* 8.4*  MG 2.2  --  2.0 2.0  --    Liver Function Tests: Recent Labs  Lab  08/21/20 0738 08/25/20 0811  AST 29 18  ALT 21 16  ALKPHOS 68 51  BILITOT 1.6* 1.8*  PROT 7.3 6.2*  ALBUMIN 3.9 3.0*   Coagulation Profile: Recent Labs  Lab 08/21/20 0738  INR 1.3*   HbA1C: No results for input(s): HGBA1C in the last 72 hours. CBG: Recent Labs  Lab 08/24/20 1120 08/24/20 1646 08/24/20 2142 08/25/20 0752 08/25/20 1024  GLUCAP 141* 119* 120* 138* 173*    Recent Results (from the past 240 hour(s))  Resp Panel by RT-PCR (Flu A&B, Covid) Nasopharyngeal Swab     Status: None   Collection Time: 08/21/20  7:38 AM   Specimen: Nasopharyngeal Swab; Nasopharyngeal(NP) swabs in vial transport medium  Result Value Ref Range Status   SARS Coronavirus  2 by RT PCR NEGATIVE NEGATIVE Final    Comment: (NOTE) SARS-CoV-2 target nucleic acids are NOT DETECTED.  The SARS-CoV-2 RNA is generally detectable in upper respiratory specimens during the acute phase of infection. The lowest concentration of SARS-CoV-2 viral copies this assay can detect is 138 copies/mL. A negative result does not preclude SARS-Cov-2 infection and should not be used as the sole basis for treatment or other patient management decisions. A negative result may occur with  improper specimen collection/handling, submission of specimen other than nasopharyngeal swab, presence of viral mutation(s) within the areas targeted by this assay, and inadequate number of viral copies(<138 copies/mL). A negative result must be combined with clinical observations, patient history, and epidemiological information. The expected result is Negative.  Fact Sheet for Patients:  EntrepreneurPulse.com.au  Fact Sheet for Healthcare Providers:  IncredibleEmployment.be  This test is no t yet approved or cleared by the [place] FDA and  has been authorized for detection and/or diagnosis of SARS-CoV-2 by FDA under an Emergency Use Authorization (EUA). This EUA will remain  in  effect (meaning this test can be used) for the duration of the COVID-19 declaration under Section 564(b)(1) of the Act, 21 U.S.C.section 360bbb-3(b)(1), unless the authorization is terminated  or revoked sooner.       Influenza A by PCR NEGATIVE NEGATIVE Final   Influenza B by PCR NEGATIVE NEGATIVE Final    Comment: (NOTE) The Xpert Xpress SARS-CoV-2/FLU/RSV plus assay is intended as an aid in the diagnosis of influenza from Nasopharyngeal swab specimens and should not be used as a sole basis for treatment. Nasal washings and aspirates are unacceptable for Xpert Xpress SARS-CoV-2/FLU/RSV testing.  Fact Sheet for Patients: EntrepreneurPulse.com.au  Fact Sheet for Healthcare Providers: IncredibleEmployment.be  This test is not yet approved or cleared by the [place] FDA and has been authorized for detection and/or diagnosis of SARS-CoV-2 by FDA under an Emergency Use Authorization (EUA). This EUA will remain in effect (meaning this test can be used) for the duration of the COVID-19 declaration under Section 564(b)(1) of the Act, 21 U.S.C. section 360bbb-3(b)(1), unless the authorization is terminated or revoked.  Performed at [facility], [address] [place], [place] [address]   Blood Culture (routine x 2)     Status: None (Preliminary result)   Collection Time: 08/21/20  7:38 AM   Specimen: BLOOD  Result Value Ref Range Status   Specimen Description BLOOD RIGHT ASSIST CONTROL  Final   Special Requests   Final    BOTTLES DRAWN AEROBIC AND ANAEROBIC Blood Culture results may not be optimal due to an excessive volume of blood received in culture bottles   Culture   Final    NO GROWTH 4 DAYS Performed at [facility], [address], [place], [place] [address]    Report Status PENDING  Incomplete  Blood Culture (routine x 2)     Status: None (Preliminary result)   Collection Time: 08/21/20  7:38 AM   Specimen:  BLOOD  Result Value Ref Range Status   Specimen Description BLOOD LEFT ASSIST CONTROL  Final   Special Requests   Final    BOTTLES DRAWN AEROBIC AND ANAEROBIC Blood Culture results may not be optimal due to an excessive volume of blood received in culture bottles   Culture   Final    NO GROWTH 4 DAYS Performed at [facility], [address], [place], [place] [address]    Report Status PENDING  Incomplete  Urine culture  Status: Abnormal   Collection Time: 08/21/20 10:13 AM   Specimen: Urine, Random  Result Value Ref Range Status   Specimen Description   Final    URINE, RANDOM Performed at [facility], [address] [place], [place] [address]    Special Requests   Final    NONE Performed at [facility], [place], [place] [address]    Culture 40,000 COLONIES/mL KLEBSIELLA PNEUMONIAE (A)  Final   Report Status 08/23/2020 FINAL  Final   Organism ID, Bacteria KLEBSIELLA PNEUMONIAE (A)  Final      Susceptibility   Klebsiella pneumoniae - MIC*    AMPICILLIN >=32 RESISTANT Resistant     CEFAZOLIN <=4 SENSITIVE Sensitive     CEFEPIME <=0.12 SENSITIVE Sensitive     CEFTRIAXONE 0.5 SENSITIVE Sensitive     CIPROFLOXACIN <=0.25 SENSITIVE Sensitive     GENTAMICIN <=1 SENSITIVE Sensitive     IMIPENEM <=0.25 SENSITIVE Sensitive     NITROFURANTOIN 32 SENSITIVE Sensitive     TRIMETH/SULFA <=20 SENSITIVE Sensitive     AMPICILLIN/SULBACTAM 4 SENSITIVE Sensitive     PIP/TAZO <=4 SENSITIVE Sensitive     * 40,000 COLONIES/mL KLEBSIELLA PNEUMONIAE     Radiology Studies: DG Chest Port 1 View  Result Date: 08/25/2020 CLINICAL DATA:  Shortness of breath EXAM: PORTABLE CHEST 1 VIEW COMPARISON:  08/21/2020 FINDINGS: Stable cardiomegaly with slightly lower lung volumes and minimal increased diffuse interstitial opacities suggesting component of mild interstitial edema. Right hemidiaphragm is elevated as before with blunting of the right costophrenic  angle, favored to represent pleuroparenchymal scarring. Faint increased right midlung perihilar opacity, difficult to exclude mild right mid lung pneumonia. No left effusion. Negative for pneumothorax. Aorta atherosclerotic and tortuous. Large hiatal hernia again noted projecting over the cardiac silhouette. Degenerative changes of the spine. Bones are osteopenic. IMPRESSION: Cardiomegaly with mild interstitial edema pattern. Chronic right basilar pleuroparenchymal scarring Mild right midlung perihilar asymmetric opacity, difficult to exclude superimposed pneumonia Very large hiatal hernia, unchanged Aortic Atherosclerosis (ICD10-I70.0). Electronically Signed   By: Jerilynn Mages.  Shick M.D.   On: 08/25/2020 08:22   Marzetta Board, MD, PhD [facility]  Between 7 am - 7 pm I am available, please contact me via Amion or Securechat  Between 7 pm - 7 am I am not available, please contact night coverage MD/APP via Amion

## 2020-08-25 NOTE — Progress Notes (Signed)
Patient had SVT at 170 and chest pain, vitals taken, MD aware and orders received for an EKG, troponin, & Chest x ray. MD aware of EKG results & Yellow MEWS score.

## 2020-08-25 NOTE — Progress Notes (Signed)
Spoke with patient's daughter and gave her an update for this evening.

## 2020-08-25 NOTE — TOC Progression Note (Signed)
Transition of Care [facility]) - Progression Note    Patient Details  Name: [name] MRN: [id number] Date of Birth: April 01, 1921  Transition [facility]) CM/SW Contact  Beverly Sessions, RN Phone Number: 08/25/2020, 11:50 AM  Clinical Narrative:     Bed offers presented to daughter [name].  She is going to take the day and research the facilities and request that [facility] follow up tomorrow   Expected Discharge Plan: [place] Barriers to Discharge: Continued Medical Work up  Expected Discharge Plan and Services Expected Discharge Plan: [place] In-house Referral: Clinical Social Work   Post Acute Care Choice: [place] Living arrangements for the past 2 months: Single Family Home                           HH Arranged: NA [place] Agency: NA         Social Determinants of Health (SDOH) Interventions    Readmission Risk Interventions Readmission Risk Prevention Plan 08/24/2020  Transportation Screening Complete  PCP or Specialist Appt within 5-7 Days Complete  Home Care Screening Complete  Medication Review (RN CM) Complete  Some recent data might be hidden

## 2020-08-25 NOTE — Progress Notes (Signed)
Occupational Therapy Treatment Patient Details Name: [name] MRN: [id number] DOB: Oct 19, 1920 Today's Date: 08/25/2020    History of present illness Patient is a 87 y.o. female with medical history significant of hypertension, GERD, depression, stomach ulcer, GI bleeding, breast cancer (s/p of left mastectomy), remote colon cancer (s/p for colectomy), arthritis, recurrent UTI, who presents with fall, altered mental status and increased urinary frequency. Found to have nondisplaced fractures involving the right superior and inferior pubic rami, nondisplaced bilateral sacral ala fractures chronic appearing L4 compression fracture, possible right 7th rib FX   OT comments  Pt seen for OT treatment this date to f/u re: safety with ADLs/ADL mobility. OT facilitates pt participation in sup to sit with MAX A. Pt demos F sitting balance. OT engages pt in seated balance tasks to improve static sitting including switching UE support, pt tolerates sitting x10 mins. Pt participates in sit to stand x2 with MAX A from elevated EOB with RW. OT gives MIN/MOD tactile/verbal cues for safe hand placement/sequence. Pt left in bed with pure-wick and SCDs replaced, bd alarm activated. All needs met and in reach. Will continue to follow acutely. Continue to anticipate that pt will require STR f/u in SNF setting upon d/c.    Follow Up Recommendations  SNF;Supervision/Assistance - 24 hour    Equipment Recommendations  Other (comment) (defer)    Recommendations for Other Services      Precautions / Restrictions Precautions Precautions: Fall Restrictions Weight Bearing Restrictions: Yes Other Position/Activity Restrictions: WBAT       Mobility Bed Mobility Overal bed mobility: Needs Assistance Bed Mobility: Sit to Supine;Supine to Sit     Supine to sit: Max assist;HOB elevated Sit to supine: Max assist   General bed mobility comments: increased time, MAX A to assist with trunk/LEs.     Transfers Overall transfer level: Needs assistance Equipment used: Rolling walker (2 wheeled) Transfers: Sit to/from Stand Sit to Stand: Max assist;From elevated surface         General transfer comment: EOB elevated ~3-4', feet blocked for sliding. cues for hand placement with Rw.    Balance Overall balance assessment: Needs assistance Sitting-balance support: Feet unsupported;Single extremity supported Sitting balance-Leahy Scale: Fair Sitting balance - Comments: seated EOB with UE support   Standing balance support: Bilateral upper extremity supported Standing balance-Leahy Scale: Zero Standing balance comment: MAX A and reliance on RW.                           ADL either performed or assessed with clinical judgement   ADL                                               Vision Patient Visual Report: No change from baseline     Perception     Praxis      Cognition Arousal/Alertness: Awake/alert Behavior During Therapy: WFL for tasks assessed/performed Overall Cognitive Status: Impaired/Different from baseline Area of Impairment: Memory;Orientation                 Orientation Level: Disoriented to;Time;Place;Situation   Memory: Decreased short-term memory         General Comments: pt is more awake and attentive today. She is orientd to self. She requires cues, but with cue that it is spring, she is able to guess that the  month is March. With 3 options of location: Library, hospital or [facility]; she is able to deduce that she is at the hospital. Follows ~60-70% of simple one step commands with increased time. Also impacted by [facility].        Exercises Other Exercises Other Exercises: OT facilitates pt participation in sup to sit with MAX A. Pt demos F sitting balance. OT engages pt in seated balance tasks to improve static sitting including switching UE support, pt tolerates sitting x10 mins. Pt participates in sit to stand  x2 with MAX A from elevated EOB with RW. OT gives MIN/MOD tactile/verbal cues for safe hand placement/sequence.   Shoulder Instructions       General Comments      Pertinent Vitals/ Pain       Pain Assessment: Faces Faces Pain Scale: Hurts even more Pain Location: knees Pain Descriptors / Indicators: Sore;Tender Pain Intervention(s): Limited activity within patient's tolerance;Monitored during session;Repositioned (elevated LEs on pillow while laying in bed to reduce pressure)  Home Living                                          Prior Functioning/Environment              Frequency  Min 2X/week        Progress Toward Goals  OT Goals(current goals can now be found in the care plan section)  Progress towards OT goals: Progressing toward goals  Acute Rehab OT Goals Patient Stated Goal: to go home OT Goal Formulation: With patient Time For Goal Achievement: 09/05/20 Potential to Achieve Goals: [place] Discharge plan remains appropriate;Frequency remains appropriate    Co-evaluation                 AM-PAC OT "6 Clicks" Daily Activity     Outcome Measure   Help from another person eating meals?: None Help from another person taking care of personal grooming?: A Little Help from another person toileting, which includes using toliet, bedpan, or urinal?: A Little Help from another person bathing (including washing, rinsing, drying)?: A Lot Help from another person to put on and taking off regular upper body clothing?: A Little Help from another person to put on and taking off regular lower body clothing?: A Lot 6 Click Score: 17    End of Session Equipment Utilized During Treatment: Gait belt;Rolling walker;Oxygen  OT Visit Diagnosis: Unsteadiness on feet (R26.81);Repeated falls (R29.6);Muscle weakness (generalized) (M62.81)   Activity Tolerance Patient tolerated treatment well   Patient Left in bed;with call bell/phone within  reach;with bed alarm set;with family/visitor present   Nurse Communication Mobility status        Time: 3762-8315 OT Time Calculation (min): 27 min  Charges: OT General Charges $OT Visit: 1 Visit OT Treatments $Therapeutic Activity: 23-37 mins  Gerrianne Scale, [place], OTR/L ascom [phone] 08/25/20, 5:20 PM

## 2020-08-25 NOTE — Progress Notes (Addendum)
Rapid Response Event Note   Reason for Call :   - Tachypnea  Initial Focused Assessment:   - Patient is HOH, awake and alert - BP is elevated 180s-190s SBP - HR ~ 100 - RR ~40 on 2L/min [place]; SPO2 ~97% - Audible crackles with respiration  Interventions:   - Spoke with Dr. Myna Hidalgo via telephone; MD is placing orders  Plan of Care:   - Bedside RN to give PRN Hydralazine per order - MD entering orders for 40 mg IV Lasix - Reassess in 1 hour  Event Summary:   MD Notified: Dr. Myna Hidalgo Call Time: 1911 hrs Arrival Time: 1913 hrs End Time: 1925 hrs  Jesse Sans, RN    Follow-up 2040 hrs: Respiratory status has improved. RR ~ 25-25 BPM. BP improved as well.

## 2020-08-26 ENCOUNTER — Encounter: Payer: Self-pay | Admitting: Internal Medicine

## 2020-08-26 ENCOUNTER — Inpatient Hospital Stay: Payer: Medicare Other

## 2020-08-26 ENCOUNTER — Inpatient Hospital Stay (HOSPITAL_COMMUNITY)
Admit: 2020-08-26 | Discharge: 2020-08-26 | Disposition: A | Discharge status: Home / Self Care | Payer: Medicare Other | Attending: Internal Medicine | Admitting: Internal Medicine

## 2020-08-26 DIAGNOSIS — A419 Sepsis, unspecified organism: Secondary | ICD-10-CM | POA: Diagnosis not present

## 2020-08-26 DIAGNOSIS — I361 Nonrheumatic tricuspid (valve) insufficiency: Secondary | ICD-10-CM

## 2020-08-26 DIAGNOSIS — S32415A Nondisplaced fracture of anterior wall of left acetabulum, initial encounter for closed fracture: Secondary | ICD-10-CM | POA: Diagnosis not present

## 2020-08-26 DIAGNOSIS — R9431 Abnormal electrocardiogram [ECG] [EKG]: Secondary | ICD-10-CM

## 2020-08-26 DIAGNOSIS — G9341 Metabolic encephalopathy: Secondary | ICD-10-CM | POA: Diagnosis not present

## 2020-08-26 DIAGNOSIS — R6521 Severe sepsis with septic shock: Secondary | ICD-10-CM | POA: Diagnosis not present

## 2020-08-26 LAB — CBC
HCT: 35.7 % — ABNORMAL LOW (ref 36.0–46.0)
Hemoglobin: 12 g/dL (ref 12.0–15.0)
MCH: 31 pg (ref 26.0–34.0)
MCHC: 33.6 g/dL (ref 30.0–36.0)
MCV: 92.2 fL (ref 80.0–100.0)
Platelets: 292 10*3/uL (ref 150–400)
RBC: 3.87 MIL/uL (ref 3.87–5.11)
RDW: 13.7 % (ref 11.5–15.5)
WBC: 12.4 10*3/uL — ABNORMAL HIGH (ref 4.0–10.5)
nRBC: 0 % (ref 0.0–0.2)

## 2020-08-26 LAB — BASIC METABOLIC PANEL
Anion gap: 11 (ref 5–15)
BUN: 23 mg/dL (ref 8–23)
CO2: 26 mmol/L (ref 22–32)
Calcium: 8.5 mg/dL — ABNORMAL LOW (ref 8.9–10.3)
Chloride: 100 mmol/L (ref 98–111)
Creatinine, Ser: 0.69 mg/dL (ref 0.44–1.00)
GFR, Estimated: >60 mL/min (ref >60)
Glucose, Bld: 152 mg/dL — ABNORMAL HIGH (ref 70–99)
Potassium: 3.4 mmol/L — ABNORMAL LOW (ref 3.5–5.1)
Sodium: 137 mmol/L (ref 135–145)

## 2020-08-26 LAB — CULTURE, BLOOD (ROUTINE X 2)
Culture: NO GROWTH
Culture: NO GROWTH

## 2020-08-26 LAB — ECHOCARDIOGRAM COMPLETE
AR max vel: 3.01 cm2
AV Area VTI: 3.26 cm2
AV Area mean vel: 3.1 cm2
AV Mean grad: 4 mmHg
AV Peak grad: 7.7 mmHg
Ao pk vel: 1.39 m/s
Area-P 1/2: 3.07 cm2
Height: 63 in
S' Lateral: 1.69 cm
Weight: 2668.8 oz

## 2020-08-26 LAB — GLUCOSE, CAPILLARY
Glucose-Capillary: 144 mg/dL — ABNORMAL HIGH (ref 70–99)
Glucose-Capillary: 149 mg/dL — ABNORMAL HIGH (ref 70–99)
Glucose-Capillary: 149 mg/dL — ABNORMAL HIGH (ref 70–99)
Glucose-Capillary: 109 mg/dL — ABNORMAL HIGH (ref 70–99)

## 2020-08-26 LAB — D-DIMER, QUANTITATIVE: D-Dimer, Quant: 10.29 ug/mL-FEU — ABNORMAL HIGH (ref 0.00–0.50)

## 2020-08-26 MED ORDER — POTASSIUM CHLORIDE CRYS ER 20 MEQ PO TBCR
30.0000 meq | EXTENDED_RELEASE_TABLET | Freq: Once | ORAL | Status: AC
  Administered 2020-08-26: 30 meq via ORAL
  Filled 2020-08-26: qty 1

## 2020-08-26 MED ORDER — FUROSEMIDE 10 MG/ML IJ SOLN
20.0000 mg | Freq: Once | INTRAMUSCULAR | Status: AC
  Administered 2020-08-26: 20 mg via INTRAVENOUS
  Filled 2020-08-26: qty 4

## 2020-08-26 MED ORDER — POTASSIUM CHLORIDE CRYS ER 20 MEQ PO TBCR
40.0000 meq | EXTENDED_RELEASE_TABLET | Freq: Once | ORAL | Status: AC
  Administered 2020-08-26: 40 meq via ORAL
  Filled 2020-08-26: qty 2

## 2020-08-26 MED ORDER — HEPARIN (PORCINE) 25000 UT/250ML-% IV SOLN
1400.0000 [IU]/h | INTRAVENOUS | Status: DC
  Administered 2020-08-26: 1150 [IU]/h via INTRAVENOUS
  Administered 2020-08-27: 1400 [IU]/h via INTRAVENOUS
  Filled 2020-08-26 (×2): qty 250

## 2020-08-26 MED ORDER — HEPARIN BOLUS VIA INFUSION
4200.0000 [IU] | Freq: Once | INTRAVENOUS | Status: AC
  Administered 2020-08-26: 4200 [IU] via INTRAVENOUS
  Filled 2020-08-26: qty 4200

## 2020-08-26 MED ORDER — METOPROLOL SUCCINATE ER 50 MG PO TB24
50.0000 mg | ORAL_TABLET | Freq: Every day | ORAL | Status: DC
  Administered 2020-08-26 – 2020-08-30 (×5): 50 mg via ORAL
  Filled 2020-08-26 (×5): qty 1

## 2020-08-26 MED ORDER — IOHEXOL 350 MG/ML SOLN
75.0000 mL | Freq: Once | INTRAVENOUS | Status: AC | PRN
  Administered 2020-08-26: 75 mL via INTRAVENOUS

## 2020-08-26 NOTE — TOC Progression Note (Signed)
Transition of Care [facility]) - Progression Note    Patient Details  Name: [name] MRN: [id number] Date of Birth: 11-10-1920  Transition of Care [facility]) CM/SW [place], LCSW Phone Number: 08/26/2020, 4:18 PM  Clinical Narrative:   Patient's daughter has not chosen a SNF yet due to poor ratings. She asked that [facility] review referral. Left messages for both admissions coordinators with request.  Expected Discharge Plan: Skilled Nursing Facility Barriers to Discharge: Continued Medical Work up  Expected Discharge Plan and Services Expected Discharge Plan: [place] In-house Referral: Clinical Social Work   Post Acute Care Choice: [place] Living arrangements for the past 2 months: Single Family Home                           HH Arranged: NA [place] Agency: NA         Social Determinants of Health (SDOH) Interventions    Readmission Risk Interventions Readmission Risk Prevention Plan 08/24/2020  Transportation Screening Complete  PCP or Specialist Appt within 5-7 Days Complete  Home Care Screening Complete  Medication Review (RN CM) Complete  Some recent data might be hidden

## 2020-08-26 NOTE — Consult Note (Signed)
ANTICOAGULATION CONSULT NOTE - Follow Up Consult  Pharmacy Consult for Heparin gtt Indication: pulmonary embolus  Allergies  Allergen Reactions  . Codeine Nausea And Vomiting    Patient Measurements: Height: 5\' 3"  (160 cm) Weight: 75.7 kg (166 lb 12.8 oz) IBW/kg (Calculated) : 52.4 Heparin Dosing Weight: 68.6 kg  Vital Signs: Temp: 98.2 F (36.8 C) (03/31 1335) Temp Source: Oral (03/31 1335) BP: 151/62 (03/31 1335) Pulse Rate: 82 (03/31 1335)  Labs: Recent Labs    08/24/20 0623 08/25/20 0811 08/25/20 0943 08/26/20 0454  HGB 12.1 12.3  --  12.0  HCT 36.5 34.6*  --  35.7*  PLT 226 272  --  292  CREATININE 0.71 0.58  --  0.69  TROPONINIHS  --  20* 19*  --     Estimated Creatinine Clearance: 39.2 mL/min (by C-G formula based on SCr of 0.69 mg/dL).   Medications:  No AC pertinent drug allergies. PTA: No AC/APT PTA Inpatient: enoxaparin 40mg  q24h (VTE ppx) >> hep gtt.  Heparin Dosing Weight: 68.6 kg  Assessment: 87yo F w/ h/o HTN, GERD, MDD, stomach ulcer, GIB, breast CAN (s/p Lt mastectomy), colon CAN (s/p colectomy), arthritis, recurrent UTIs, who present with fall and AMS ISO inc'd urinary freq.   Over 3/30-31 pt experiencing some runs of self-terminating SVT and RR in 20-30s with initial c/f PNA. D-dimer found to be elevated at 10.29. CTA showing c/f small PE (no large central) and LE [place] ordered pending. Pharmacy consulted for the mgmt of Heparin gtt.  Date Time HL Rate/Comment   Labs:  Baseline aPTT on admit 34s; INR 1.3 Hgb stable ~12; Plts WNL D-Dimer: 10.29 (3/31)  Goal of Therapy:  Heparin level 0.3-0.7 units/ml Monitor platelets by anticoagulation protocol: Yes   Plan:  Give 4200 units bolus x 1 Start heparin infusion at 1150 units/hr Check anti-Xa level in 8hours and daily while on heparin Continue to monitor H&H and platelets  [name] [name] 08/26/2020,2:56 PM

## 2020-08-26 NOTE — Progress Notes (Signed)
*  PRELIMINARY RESULTS* Echocardiogram 2D Echocardiogram has been performed.  [name] 08/26/2020, 9:25 AM

## 2020-08-26 NOTE — Progress Notes (Signed)
MD aware of patients episode of SVT.

## 2020-08-26 NOTE — Progress Notes (Signed)
   08/25/20 0740  Assess: MEWS Score  Temp 98.5 F (36.9 C)  BP (!) 151/76  Pulse Rate 90  Resp (!) 26  SpO2 93 %  O2 Device Room Air  Assess: MEWS Score  MEWS Temp 0  MEWS Systolic 0  MEWS Pulse 0  MEWS RR 2  MEWS LOC 0  MEWS Score 2  MEWS Score Color Yellow  Assess: if the MEWS score is Yellow or Red  Were vital signs taken at a resting state? Yes  Focused Assessment Change from prior assessment (see assessment flowsheet)  Early Detection of Sepsis Score *See Row Information* Low  MEWS guidelines implemented *See Row Information* Yes  Treat  MEWS Interventions Escalated (See documentation below)  Pain Scale 0-10  Pain Score 4  Faces Pain Scale 4  Pain Type Acute pain  Pain Location Chest  Pain Onset Gradual  Pain Intervention(s) Other (Comment) (MD aware)  Take Vital Signs  Increase Vital Sign Frequency  Yellow: Q 2hr X 2 then Q 4hr X 2, if remains yellow, continue Q 4hrs  Escalate  MEWS: Escalate Yellow: discuss with charge nurse/RN and consider discussing with provider and RRT  Notify: Charge Nurse/RN  Name of Charge Nurse/RN Notified [place], RN  Date Charge Nurse/RN Notified 08/25/20  Time Charge Nurse/RN Notified 0830  Notify: Provider  Provider Name/Title Dr. Cruzita Lederer  Date Provider Notified 08/25/20  Time Provider Notified 0745  Notification Type Page  Notification Reason Change in status  Provider response At bedside  Date of Provider Response 08/25/20  Time of Provider Response 0800  Inserted for [place] RN

## 2020-08-26 NOTE — Progress Notes (Signed)
PROGRESS NOTE  [name]:[id number] DOB: 1920/10/19 DOA: 08/21/2020 PCP: [name], MD   LOS: 5 days   Brief Narrative / Interim history: [name] a 87 y.o.femalewith medical history significant ofhypertension, GERD, depression, stomach ulcer, GI bleeding, breast cancer (s/p of left mastectomy), remote colon cancer (s/p for colectomy), arthritis, recurrent UTI, who presents with fall, altered mental status and increased urinary frequency.Per patient daughter, patient was very confused with hallucination, she wandered off home, had a fall. X-ray showed a pelvic fracture, no surgical indication. Patient was also had a severe lactic acidosis of 11.0. Leukocytosis of 28. She was given IV fluid in the emergency room, she was covered with meropenem for UTI. 3/28.Culture showed Klebsiella pneumonia, antibiotics switched to Keflex based on susceptibility.   Subjective / 24h Interval events: Doing okay, still feels a little bit short of breath.  No chest pain, no palpitations  Assessment & Plan: Principal Problem Acute metabolic encephalopathy due to severe sepsis with septic shock secondary to urinary tract infection. Underlying dementia -Sepsis physiology resolved.  Urine cultures showed Klebsiella.  She was initially placed on meropenem but eventually transitioned to Keflex upon speciation.  Today is day 5 of antibiotic therapy for UTI -Mental status improving, needs SNF  Active Problems SVT, RBBB -Patient has had few runs of SVT, metoprolol has been increased this morning.  During admission was also found to have a new right bundle branch block.  2D echo is pending.  D-dimer was elevated and CT angiogram is pending as well, although it appears that she has received DVT prophylaxis with Lovenox while hospitalized.  Chest x-ray done 3/30 did show some evidence of slight fluid overload and was given Lasix  Acute on chronic diastolic CHF -Likely in the setting of  fluid resuscitation due to her sepsis.  Was given Lasix yesterday, a 2D echo pending  Possible lobar pneumonia -on CXR. 3 days of doxy.  Waiting on CT angiogram today  Essential hypertension -Continue metoprolol.  Pelvic fracture - due to mechanical fall. Patient was evaluated by orthopedics, no surgery needed. Needs SNF, placement pending  Scheduled Meds: . vitamin C  1,000 mg Oral Daily  . cholecalciferol  2,000 Units Oral Daily  . doxycycline  100 mg Oral Q12H  . DULoxetine  30 mg Oral Daily  . enoxaparin (LOVENOX) injection  40 mg Subcutaneous Q24H  . insulin aspart  0-5 Units Subcutaneous QHS  . insulin aspart  0-9 Units Subcutaneous TID WC  . metoprolol succinate  50 mg Oral Daily  . pantoprazole  40 mg Oral Daily  . pneumococcal 23 valent vaccine  0.5 mL Intramuscular Tomorrow-1000  . polyethylene glycol  17 g Oral Daily  . QUEtiapine  25 mg Oral QHS  . senna-docusate  1 tablet Oral QHS  . vitamin B-12  1,000 mcg Oral Daily   Continuous Infusions: PRN Meds:.acetaminophen, hydrALAZINE, hydrOXYzine, loperamide, methocarbamol, oxyCODONE-acetaminophen  Diet Orders (From admission, onward)    Start     Ordered   08/21/20 1643  Diet Heart Room service appropriate? Yes; Fluid consistency: Thin  Diet effective now       Question Answer Comment  Room service appropriate? Yes   Fluid consistency: Thin      08/21/20 1642          DVT prophylaxis: enoxaparin (LOVENOX) injection 40 mg Start: 08/22/20 2200 SCDs Start: 08/22/20 1042 SCDs Start: 08/21/20 1643     Code Status: DNR  Family Communication: daughter at bedside   Status  is: Inpatient  Remains inpatient appropriate because:Inpatient level of care appropriate due to severity of illness   Dispo: The patient is from: Home              Anticipated d/c is to: SNF              Patient currently is not medically stable to d/c.   Difficult to place patient No    Level of care: Med-Surg  Consultants:   none  Procedures:  2D echo: pending  Microbiology  Urine cultures - klebsiella  Antimicrobials: Keflex Doxy    Objective: Vitals:   08/25/20 2340 08/26/20 0431 08/26/20 0734 08/26/20 1115  BP: (!) 118/47 132/80 (!) 138/55 122/70  Pulse: 84 85 74 80  Resp: (!) 37 (!) 26 (!) 29 (!) 34  Temp: 98 F (36.7 C) 98.9 F (37.2 C) 98.1 F (36.7 C) 98.2 F (36.8 C)  TempSrc: Oral Oral Oral Oral  SpO2: 97% 96% 95% 96%  Weight:      Height:        Intake/Output Summary (Last 24 hours) at 08/26/2020 1146 Last data filed at 08/26/2020 1012 Gross per 24 hour  Intake 500 ml  Output 2250 ml  Net -1750 ml   Filed Weights   08/23/20 0900 08/24/20 0500 08/24/20 1022  Weight: 75.8 kg 73 kg 75.7 kg    Examination:  Constitutional: No distress, appears tired Eyes: No scleral icterus ENMT: mmm Neck: normal, supple Respiratory: Faint bibasilar crackles, improved, no wheezing, tachypneic Cardiovascular: Regular rate and rhythm, no murmurs, no edema Abdomen: Soft, NT, ND, bowel sounds positive Musculoskeletal: no clubbing / cyanosis.  Skin: No rashes seen Neurologic: No focal deficits  Data Reviewed: I have independently reviewed following labs and imaging studies   CBC: Recent Labs  Lab 08/21/20 0738 08/22/20 0545 08/23/20 0259 08/24/20 0623 08/25/20 0811 08/26/20 0454  WBC 28.1* 15.7* 15.9* 13.0* 11.7* 12.4*  NEUTROABS 24.0*  --  12.5* 10.3*  --   --   HGB 14.0 11.9* 11.8* 12.1 12.3 12.0  HCT 43.0 35.2* 35.2* 36.5 34.6* 35.7*  MCV 95.8 92.4 93.1 93.1 91.1 92.2  PLT 300 217 232 226 272 710   Basic Metabolic Panel: Recent Labs  Lab 08/21/20 0738 08/22/20 0545 08/23/20 0259 08/24/20 0623 08/25/20 0811 08/26/20 0454  NA 138 137 134* 136 134* 137  K 3.4* 4.1 3.6 3.5 3.6 3.4*  CL 105 106 102 104 103 100  CO2 19* 22 21* 23 23 26   GLUCOSE 279* 140* 126* 155* 140* 152*  BUN 17 13 16 19 20 23   CREATININE 0.90 0.73 0.75 0.71 0.58 0.69  CALCIUM 8.7* 8.7* 8.8* 8.7*  8.4* 8.5*  MG 2.2  --  2.0 2.0  --   --    Liver Function Tests: Recent Labs  Lab 08/21/20 0738 08/25/20 0811  AST 29 18  ALT 21 16  ALKPHOS 68 51  BILITOT 1.6* 1.8*  PROT 7.3 6.2*  ALBUMIN 3.9 3.0*   Coagulation Profile: Recent Labs  Lab 08/21/20 0738  INR 1.3*   HbA1C: No results for input(s): HGBA1C in the last 72 hours. CBG: Recent Labs  Lab 08/25/20 1109 08/25/20 1206 08/25/20 1703 08/25/20 2130 08/26/20 0749  GLUCAP 168* 128* 111* 173* 144*    Recent Results (from the past 240 hour(s))  Resp Panel by RT-PCR (Flu A&B, Covid) Nasopharyngeal Swab     Status: None   Collection Time: 08/21/20  7:38 AM   Specimen: Nasopharyngeal Swab; Nasopharyngeal(NP)  swabs in vial transport medium  Result Value Ref Range Status   SARS Coronavirus 2 by RT PCR NEGATIVE NEGATIVE Final    Comment: (NOTE) SARS-CoV-2 target nucleic acids are NOT DETECTED.  The SARS-CoV-2 RNA is generally detectable in upper respiratory specimens during the acute phase of infection. The lowest concentration of SARS-CoV-2 viral copies this assay can detect is 138 copies/mL. A negative result does not preclude SARS-Cov-2 infection and should not be used as the sole basis for treatment or other patient management decisions. A negative result may occur with  improper specimen collection/handling, submission of specimen other than nasopharyngeal swab, presence of viral mutation(s) within the areas targeted by this assay, and inadequate number of viral copies(<138 copies/mL). A negative result must be combined with clinical observations, patient history, and epidemiological information. The expected result is Negative.  Fact Sheet for Patients:  EntrepreneurPulse.com.au  Fact Sheet for Healthcare Providers:  IncredibleEmployment.be  This test is no t yet approved or cleared by the [place] FDA and  has been authorized for detection and/or diagnosis of  SARS-CoV-2 by FDA under an Emergency Use Authorization (EUA). This EUA will remain  in effect (meaning this test can be used) for the duration of the COVID-19 declaration under Section 564(b)(1) of the Act, 21 U.S.C.section 360bbb-3(b)(1), unless the authorization is terminated  or revoked sooner.       Influenza A by PCR NEGATIVE NEGATIVE Final   Influenza B by PCR NEGATIVE NEGATIVE Final    Comment: (NOTE) The Xpert Xpress SARS-CoV-2/FLU/RSV plus assay is intended as an aid in the diagnosis of influenza from Nasopharyngeal swab specimens and should not be used as a sole basis for treatment. Nasal washings and aspirates are unacceptable for Xpert Xpress SARS-CoV-2/FLU/RSV testing.  Fact Sheet for Patients: EntrepreneurPulse.com.au  Fact Sheet for Healthcare Providers: IncredibleEmployment.be  This test is not yet approved or cleared by the [place] FDA and has been authorized for detection and/or diagnosis of SARS-CoV-2 by FDA under an Emergency Use Authorization (EUA). This EUA will remain in effect (meaning this test can be used) for the duration of the COVID-19 declaration under Section 564(b)(1) of the Act, 21 U.S.C. section 360bbb-3(b)(1), unless the authorization is terminated or revoked.  Performed at [facility], [address] [place], [place] [address]   Blood Culture (routine x 2)     Status: None   Collection Time: 08/21/20  7:38 AM   Specimen: BLOOD  Result Value Ref Range Status   Specimen Description BLOOD RIGHT ASSIST CONTROL  Final   Special Requests   Final    BOTTLES DRAWN AEROBIC AND ANAEROBIC Blood Culture results may not be optimal due to an excessive volume of blood received in culture bottles   Culture   Final    NO GROWTH 5 DAYS Performed at [facility], [address] [place], [place] [address]    Report Status 08/26/2020 FINAL  Final  Blood Culture (routine x 2)     Status: None    Collection Time: 08/21/20  7:38 AM   Specimen: BLOOD  Result Value Ref Range Status   Specimen Description BLOOD LEFT ASSIST CONTROL  Final   Special Requests   Final    BOTTLES DRAWN AEROBIC AND ANAEROBIC Blood Culture results may not be optimal due to an excessive volume of blood received in culture bottles   Culture   Final    NO GROWTH 5 DAYS Performed at [facility], [address] [place], [place]  [address]    Report Status 08/26/2020 FINAL  Final  Urine culture     Status: Abnormal   Collection Time: 08/21/20 10:13 AM   Specimen: Urine, Random  Result Value Ref Range Status   Specimen Description   Final    URINE, RANDOM Performed at [facility], [address], [place], [place] [address]    Special Requests   Final    NONE Performed at [facility], [place], [place] [address]    Culture 40,000 COLONIES/mL KLEBSIELLA PNEUMONIAE (A)  Final   Report Status 08/23/2020 FINAL  Final   Organism ID, Bacteria KLEBSIELLA PNEUMONIAE (A)  Final      Susceptibility   Klebsiella pneumoniae - MIC*    AMPICILLIN >=32 RESISTANT Resistant     CEFAZOLIN <=4 SENSITIVE Sensitive     CEFEPIME <=0.12 SENSITIVE Sensitive     CEFTRIAXONE 0.5 SENSITIVE Sensitive     CIPROFLOXACIN <=0.25 SENSITIVE Sensitive     GENTAMICIN <=1 SENSITIVE Sensitive     IMIPENEM <=0.25 SENSITIVE Sensitive     NITROFURANTOIN 32 SENSITIVE Sensitive     TRIMETH/SULFA <=20 SENSITIVE Sensitive     AMPICILLIN/SULBACTAM 4 SENSITIVE Sensitive     PIP/TAZO <=4 SENSITIVE Sensitive     * 40,000 COLONIES/mL KLEBSIELLA PNEUMONIAE     Radiology Studies: No results found. Marzetta Board, MD, PhD [facility]  Between 7 am - 7 pm I am available, please contact me via Amion or Securechat  Between 7 pm - 7 am I am not available, please contact night coverage MD/APP via Amion

## 2020-08-26 NOTE — Progress Notes (Signed)
OT Cancellation Note  Patient Details Name: [name] MRN: [id number] DOB: March 01, 1921   Cancelled Treatment:    Reason Eval/Treat Not Completed: Medical issues which prohibited therapy. Chart reviewed. Pending imaging results for DVT and noted chest imaging today stating "Suggestion of filling defect seen in upper lobe branch of right pulmonary artery which may simply represent motion artifact, but the possibility of small peripheral pulmonary embolus cannot be excluded." Pt started on Heparin at 1638. Per protocol, will hold this date. Plan to follow up at later date/time as able.   Dessie Coma, M.S. OTR/L  08/26/20, 4:51 PM  ascom [phone]

## 2020-08-26 NOTE — Plan of Care (Signed)
  Problem: Nutrition: Goal: Adequate nutrition will be maintained Outcome: Progressing   Problem: Coping: Goal: Level of anxiety will decrease Outcome: Progressing   Problem: Elimination: Goal: Will not experience complications related to urinary retention Outcome: Progressing   Problem: Pain Managment: Goal: General experience of comfort will improve Outcome: Progressing   Problem: Safety: Goal: Ability to remain free from injury will improve Outcome: Progressing   Problem: Skin Integrity: Goal: Risk for impaired skin integrity will decrease Outcome: Progressing   Problem: Fluid Volume: Goal: Hemodynamic stability will improve Outcome: Progressing

## 2020-08-27 ENCOUNTER — Inpatient Hospital Stay: Payer: Medicare Other

## 2020-08-27 DIAGNOSIS — R6521 Severe sepsis with septic shock: Secondary | ICD-10-CM | POA: Diagnosis not present

## 2020-08-27 DIAGNOSIS — G9341 Metabolic encephalopathy: Secondary | ICD-10-CM | POA: Diagnosis not present

## 2020-08-27 DIAGNOSIS — Z7189 Other specified counseling: Secondary | ICD-10-CM

## 2020-08-27 DIAGNOSIS — S32415A Nondisplaced fracture of anterior wall of left acetabulum, initial encounter for closed fracture: Secondary | ICD-10-CM | POA: Diagnosis not present

## 2020-08-27 DIAGNOSIS — A419 Sepsis, unspecified organism: Secondary | ICD-10-CM | POA: Diagnosis not present

## 2020-08-27 LAB — CBC
HCT: 37.1 % (ref 36.0–46.0)
Hemoglobin: 12.2 g/dL (ref 12.0–15.0)
MCH: 31.1 pg (ref 26.0–34.0)
MCHC: 32.9 g/dL (ref 30.0–36.0)
MCV: 94.6 fL (ref 80.0–100.0)
Platelets: 301 10*3/uL (ref 150–400)
RBC: 3.92 MIL/uL (ref 3.87–5.11)
RDW: 14.1 % (ref 11.5–15.5)
WBC: 12.1 10*3/uL — ABNORMAL HIGH (ref 4.0–10.5)
nRBC: 0 % (ref 0.0–0.2)

## 2020-08-27 LAB — COMPREHENSIVE METABOLIC PANEL
ALT: 18 U/L (ref 0–44)
AST: 18 U/L (ref 15–41)
Albumin: 2.9 g/dL — ABNORMAL LOW (ref 3.5–5.0)
Alkaline Phosphatase: 56 U/L (ref 38–126)
Anion gap: 12 (ref 5–15)
BUN: 26 mg/dL — ABNORMAL HIGH (ref 8–23)
CO2: 24 mmol/L (ref 22–32)
Calcium: 8.7 mg/dL — ABNORMAL LOW (ref 8.9–10.3)
Chloride: 103 mmol/L (ref 98–111)
Creatinine, Ser: 0.74 mg/dL (ref 0.44–1.00)
GFR, Estimated: >60 mL/min (ref >60)
Glucose, Bld: 131 mg/dL — ABNORMAL HIGH (ref 70–99)
Potassium: 4.2 mmol/L (ref 3.5–5.1)
Sodium: 139 mmol/L (ref 135–145)
Total Bilirubin: 1.6 mg/dL — ABNORMAL HIGH (ref 0.3–1.2)
Total Protein: 6.4 g/dL — ABNORMAL LOW (ref 6.5–8.1)

## 2020-08-27 LAB — GLUCOSE, CAPILLARY
Glucose-Capillary: 127 mg/dL — ABNORMAL HIGH (ref 70–99)
Glucose-Capillary: 120 mg/dL — ABNORMAL HIGH (ref 70–99)
Glucose-Capillary: 164 mg/dL — ABNORMAL HIGH (ref 70–99)
Glucose-Capillary: 130 mg/dL — ABNORMAL HIGH (ref 70–99)

## 2020-08-27 LAB — HEPARIN LEVEL (UNFRACTIONATED): Heparin Unfractionated: <0.1 IU/mL — ABNORMAL LOW (ref 0.30–0.70)

## 2020-08-27 MED ORDER — HEPARIN BOLUS VIA INFUSION
2000.0000 [IU] | Freq: Once | INTRAVENOUS | Status: AC
  Administered 2020-08-27: 2000 [IU] via INTRAVENOUS
  Filled 2020-08-27: qty 2000

## 2020-08-27 MED ORDER — APIXABAN 5 MG PO TABS: 5.0000 mg | ORAL_TABLET | Freq: Two times a day (BID) | ORAL | Status: DC

## 2020-08-27 MED ORDER — FUROSEMIDE 10 MG/ML IJ SOLN
40.0000 mg | Freq: Once | INTRAMUSCULAR | Status: AC
  Administered 2020-08-27: 40 mg via INTRAVENOUS
  Filled 2020-08-27: qty 4

## 2020-08-27 MED ORDER — APIXABAN 5 MG PO TABS
10.0000 mg | ORAL_TABLET | Freq: Two times a day (BID) | ORAL | Status: DC
  Administered 2020-08-27 – 2020-08-30 (×7): 10 mg via ORAL
  Filled 2020-08-27 (×8): qty 2

## 2020-08-27 NOTE — Consult Note (Signed)
Consultation Note Date: 08/27/2020   Patient Name: [name]  DOB: 1921/03/05  MRN: [id number]  Age / Sex: 87 y.o., female  PCP: [name], MD Referring Physician: Caren Griffins, MD  Reason for Consultation: Establishing goals of care  HPI/Patient Profile: [name] is a 87 y.o. female with medical history significant of hypertension, GERD, depression, stomach ulcer, GI bleeding, breast cancer (s/p of left mastectomy), remote colon cancer (s/p for colectomy), arthritis, recurrent UTI, who presents with fall, altered mental status and increased urinary frequency.  Clinical Assessment and Goals of Care: Patient is sitting in bed eating lunch. She says hello but does not answer further questions.   Spoke with her daughter. Daughter states her mother "lives alone". She is widowed and has 4 children. Daughter states she goes to her home several times a day to fix her breakfast, fix her meds, coffee pot, bring her lunch and then checks on her in the evening. Patient is really only alone a few hours during the day and at night while she sleeps. She discusses that the patient needs supervision with bathing. 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 She goes out to eat with her family and rides around with her son on drives through the country.   We discussed her diagnoses, prognosis, GOC, EOL wishes disposition and options.  Created space and opportunity for patient  to explore thoughts and feelings regarding current medical information. She discusses her fears and concerns over the future, and the fact that both patient and her children are elderly.   A detailed discussion was had today regarding advanced directives.  Concepts specific to code status, artifical feeding and hydration, IV antibiotics and rehospitalization were discussed.  The difference between an aggressive medical intervention path and a comfort  care path was discussed.  Values and goals of care important to patient and family were attempted to be elicited.  Discussed limitations of medical interventions to prolong quality of life in some situations and discussed the concept of human mortality.  She states her mother is a Darrick Meigs and is ready to die when it is time. She states they are working on rehab, but if she has any functional loss from where she was prior to the hospitalization, she will need permanent facility placement. Discussed that this would not provide an acceptable QOL. Will follow up Monday for further planning.     SUMMARY OF RECOMMENDATIONS   Will f/u Monday for further conversation and planning. Recommend palliative to follow outpatient if she discharges before we can meet Monday. Family determining path moving forward.    Prognosis:   Unable to determine      Primary Diagnoses: Present on Admission: . Severe sepsis with septic shock ([place]) . GERD (gastroesophageal reflux disease) . Hypertension . Recurrent UTI . Depression . Acute metabolic encephalopathy . Pelvic ring fracture ([place]) . Hypokalemia . Hyperglycemia   I have reviewed the medical record, interviewed the patient and family, and examined the patient. The following aspects are pertinent.  Past Medical  History:  Diagnosis Date  . Allergy   . Arthritis    back  . Benign neoplasm of skin, site unspecified   . Bowel trouble   . Breast cancer (HCC)   . Cancer (HCC)    colon 20 years ago  . Cancer of breast [facility]) April 10,.2013   Left breast, 3.8 cm, 4 cm axillary node, T2, N1a, ER negative PR negative, HER-2/neu not amplified.  . Cataract   . Colon cancer (HCC)   . Hard of hearing   . Heartburn   . Hiatal hernia   . History of stomach ulcers   . History of stomach ulcers   . Hypertension 2012  . Personal history of malignant neoplasm of breast 2013   LEFT MASTECTOMY,left modified radical mastectomy on September 06, 2011 483.8 cm  primary tumor with a 4.6 cm axillary metastasis. 3 of 13 nodes were positive. T2,N1a lesion. This was a triple negative lesion  . Shingles Dec 2015  . Vaginal atrophy 08/10/2015  . Wrist fracture, right    Social History   Socioeconomic History  . Marital status: Widowed    Spouse name: Not on file  . Number of children: Not on file  . Years of education: Not on file  . Highest education level: Not on file  Occupational History  . Not on file  Tobacco Use  . Smoking status: Never Smoker  . Smokeless tobacco: Never Used  Vaping Use  . Vaping Use: Never used  Substance and Sexual Activity  . Alcohol use: No  . Drug use: No  . Sexual activity: Never    Birth control/protection: None  Other Topics Concern  . Not on file  Social History Narrative  . Not on file   Social Determinants of Health   Financial Resource Strain: Not on file  Food Insecurity: Not on file  Transportation Needs: Not on file  Physical Activity: Not on file  Stress: Not on file  Social Connections: Not on file   Family History  Problem Relation Age of Onset  . Lung cancer Brother        colono ca  . Cancer Brother   . Skin cancer Daughter   . Leukemia Father   . Cancer Father   . Colon cancer Mother   . Cancer Mother   . Skin cancer Son   . Kidney disease Neg Hx   . Bladder Cancer Neg Hx    Scheduled Meds: . apixaban  10 mg Oral BID   Followed by  . [START ON 09/03/2020] apixaban  5 mg Oral BID  . vitamin C  1,000 mg Oral Daily  . cholecalciferol  2,000 Units Oral Daily  . DULoxetine  30 mg Oral Daily  . insulin aspart  0-5 Units Subcutaneous QHS  . insulin aspart  0-9 Units Subcutaneous TID WC  . metoprolol succinate  50 mg Oral Daily  . pantoprazole  40 mg Oral Daily  . pneumococcal 23 valent vaccine  0.5 mL Intramuscular Tomorrow-1000  . polyethylene glycol  17 g Oral Daily  . QUEtiapine  25 mg Oral QHS  . senna-docusate  1 tablet Oral QHS  . vitamin B-12  1,000 mcg Oral Daily    Continuous Infusions: PRN Meds:.acetaminophen, hydrALAZINE, hydrOXYzine, loperamide, methocarbamol, oxyCODONE-acetaminophen Medications Prior to Admission:  Prior to Admission medications   Medication Sig Start Date End Date Taking? Authorizing Provider  Ascorbic Acid (VITAMIN C) 1000 MG tablet Take 1,000 mg by mouth daily.   Yes [provider]  Cholecalciferol (VITAMIN D) 50 MCG (2000 UT) CAPS Take 1 capsule by mouth daily.   Yes [provider]  Cranberry 500 MG CAPS Take by mouth daily.   Yes [provider]  DULoxetine (CYMBALTA) 30 MG capsule Take 30 mg by mouth daily. 07/09/20  Yes [provider]  Lactobacillus Rhamnosus, GG, (PROBIOTIC COLIC PO) Take 1 Dose by mouth daily. doterra brands   Yes [provider]  Melatonin 5 MG CAPS Take 1 capsule by mouth.    Yes [provider]  metoprolol succinate (TOPROL-XL) 25 MG 24 hr tablet Take 25 mg by mouth daily.   Yes [provider]  pantoprazole (PROTONIX) 40 MG tablet TAKE 1 TABLET BY MOUTH ONCE DAILY Patient taking differently: Take 40 mg by mouth daily. 02/22/18  Yes [name], MD  Potassium 99 MG TABS Take 1 tablet by mouth daily.   Yes [provider]  vitamin B-12 (CYANOCOBALAMIN) 1000 MCG tablet Take 1,000 mcg by mouth daily.   Yes [provider]  amoxicillin-clavulanate (AUGMENTIN) 875-125 MG tablet Take 1 tablet by mouth 2 (two) times daily. Patient not taking: No sig reported 08/04/20   Rubie Maid, MD  loperamide (IMODIUM A-D) 2 MG tablet Take 2 mg by mouth as needed for diarrhea or loose stools.    [provider]   Allergies  Allergen Reactions  . Codeine Nausea And Vomiting   Review of Systems  All other systems reviewed and are negative.   Physical Exam Pulmonary:     Effort: Pulmonary effort is normal.  Neurological:     Mental Status: She is alert.     Vital Signs: BP 129/67 (BP Location: Right Arm)   Pulse 77    Temp 97.9 F (36.6 C) (Oral)   Resp (!) 25   Ht $R'5\' 3"'ve$  (1.6 m)   Wt 75.7 kg   SpO2 98%   BMI 29.55 kg/m  Pain Scale: 0-10 POSS *See Group Information*: 1-Acceptable,Awake and alert Pain Score: 0-No pain   SpO2: SpO2: 98 % O2 Device:SpO2: 98 % O2 Flow Rate: .O2 Flow Rate (L/min): 2 L/min  IO: Intake/output summary:   Intake/Output Summary (Last 24 hours) at 08/27/2020 1526 Last data filed at 08/27/2020 1300 Gross per 24 hour  Intake 425.45 ml  Output 200 ml  Net 225.45 ml    LBM: Last BM Date:  (Unknown) Baseline Weight: Weight: 65.8 kg Most recent weight: Weight: 75.7 kg        Time In: 2:40 Time Out: 3:30 Time Total: 50 min Greater than 50%  of this time was spent counseling and coordinating care related to the above assessment and plan.  Signed by: Asencion Gowda, NP   Please contact Palliative Medicine Team phone at [phone] for questions and concerns.  For individual provider: See Shea Evans

## 2020-08-27 NOTE — TOC Progression Note (Signed)
Transition of Care [facility]) - Progression Note    Patient Details  Name: [name] MRN: [id number] Date of Birth: Nov 07, 1920  Transition of [facility] CM/SW [place], LCSW Phone Number: 08/27/2020, 9:42 AM  Clinical Narrative:   Asked [name] at [facility] to review referral. Patient has bed offer at [facility]. Will start auth when patient is close to being medically ready for DC.    Expected Discharge Plan: [place] Barriers to Discharge: Continued Medical Work up  Expected Discharge Plan and Services Expected Discharge Plan: [employer] In-house Referral: Clinical Social Work   Post Acute Care Choice: [place] Living arrangements for the past 2 months: Single Family Home                           HH Arranged: NA [place] Agency: NA         Social Determinants of Health (SDOH) Interventions    Readmission Risk Interventions Readmission Risk Prevention Plan 08/24/2020  Transportation Screening Complete  PCP or Specialist Appt within 5-7 Days Complete  Home Care Screening Complete  Medication Review (RN CM) Complete  Some recent data might be hidden

## 2020-08-27 NOTE — Care Management Important Message (Signed)
Important Message  Patient Details  Name: [name] MRN: [id number] Date of Birth: 25-Mar-1921   Medicare Important Message Given:  Yes     Dannette Barbara 08/27/2020, 11:34 AM

## 2020-08-27 NOTE — Plan of Care (Signed)

## 2020-08-27 NOTE — Consult Note (Signed)
ANTICOAGULATION CONSULT NOTE - Follow Up Consult  Pharmacy Consult for Heparin gtt Indication: pulmonary embolus  Allergies  Allergen Reactions  . Codeine Nausea And Vomiting    Patient Measurements: Height: 5\' 3"  (160 cm) Weight: 75.7 kg (166 lb 12.8 oz) IBW/kg (Calculated) : 52.4 Heparin Dosing Weight: 68.6 kg  Vital Signs: Temp: 98.4 F (36.9 C) (03/31 2321) Temp Source: Oral (03/31 2321) BP: 116/63 (03/31 2321) Pulse Rate: 74 (03/31 2321)  Labs: Recent Labs    08/24/20 4142 08/25/20 0811 08/25/20 0943 08/26/20 0454 08/26/20 2334  HGB 12.1 12.3  --  12.0  --   HCT 36.5 34.6*  --  35.7*  --   PLT 226 272  --  292  --   HEPARINUNFRC  --   --   --   --  <0.10*  CREATININE 0.71 0.58  --  0.69  --   TROPONINIHS  --  20* 19*  --   --     Estimated Creatinine Clearance: 39.2 mL/min (by C-G formula based on SCr of 0.69 mg/dL).   Medications:  No AC pertinent drug allergies. PTA: No AC/APT PTA Inpatient: enoxaparin 40mg  q24h (VTE ppx) >> hep gtt.  Heparin Dosing Weight: 68.6 kg  Assessment: 87yo F w/ h/o HTN, GERD, MDD, stomach ulcer, GIB, breast CAN (s/p Lt mastectomy), colon CAN (s/p colectomy), arthritis, recurrent UTIs, who present with fall and AMS ISO inc'd urinary freq.   Over 3/30-31 pt experiencing some runs of self-terminating SVT and RR in 20-30s with initial c/f PNA. D-dimer found to be elevated at 10.29. CTA showing c/f small PE (no large central) and LE [place] ordered pending. Pharmacy consulted for the mgmt of Heparin gtt.  Date Time HL Rate/Comment   Labs:  Baseline aPTT on admit 34s; INR 1.3 Hgb stable ~12; Plts WNL D-Dimer: 10.29 (3/31)  Goal of Therapy:  Heparin level 0.3-0.7 units/ml Monitor platelets by anticoagulation protocol: Yes   Plan:  3/31: HL @ 2334 = < 0.1 Will order heparin 2000 units IV X 1 bolus and increase drip rate to 1400 units/hr. Will recheck HL 8 hrs after rate change.   Khadijatou Borak D 08/27/2020,2:54 AM

## 2020-08-27 NOTE — Consult Note (Signed)
ANTICOAGULATION CONSULT NOTE - Follow Up Consult  Pharmacy Consult for Heparin gtt >> Eliquis Indication: pulmonary embolus  Allergies  Allergen Reactions  . Codeine Nausea And Vomiting    Patient Measurements: Height: 5\' 3"  (160 cm) Weight: 75.7 kg (166 lb 12.8 oz) IBW/kg (Calculated) : 52.4 Heparin Dosing Weight: 68.6 kg  Vital Signs: Temp: 98.2 F (36.8 C) (04/01 0319) Temp Source: Oral (03/31 2321) BP: 129/60 (04/01 0319) Pulse Rate: 78 (04/01 0319)  Labs: Recent Labs    08/25/20 0811 08/25/20 0943 08/26/20 0454 08/26/20 2334 08/27/20 0454  HGB 12.3  --  12.0  --  12.2  HCT 34.6*  --  35.7*  --  37.1  PLT 272  --  292  --  301  HEPARINUNFRC  --   --   --  <0.10*  --   CREATININE 0.58  --  0.69  --  0.74  TROPONINIHS 20* 19*  --   --   --     Estimated Creatinine Clearance: 39.2 mL/min (by C-G formula based on SCr of 0.74 mg/dL).   Medications:  No AC pertinent drug allergies. PTA: No AC/APT PTA Inpatient: enoxaparin 40mg  q24h (VTE ppx) >> hep gtt.  Heparin Dosing Weight: 68.6 kg  Assessment: 87yo F w/ h/o HTN, GERD, MDD, stomach ulcer, GIB, breast CAN (s/p Lt mastectomy), colon CAN (s/p colectomy), arthritis, recurrent UTIs, who present with fall and AMS ISO inc'd urinary freq.   Over 3/30-31 pt experiencing some runs of self-terminating SVT and RR in 20-30s with initial c/f PNA. D-dimer found to be elevated at 10.29. CTA showing c/f small PE (no large central) and LE [place] ordered pending. Pharmacy consulted for the mgmt of Heparin gtt.  Date Time HL Rate/Comment 3/31 2334 <0.1 Subthera; 1150>1400 un/hr 4/1 1000 -- Transition to [place] (Eliquis)  Labs:  Baseline aPTT on admit 34s; INR 1.3 Hgb stable ~12; Plts WNL D-Dimer: 10.29 (3/31)  Goal of Therapy:  Heparin level 0.3-0.7 units/ml Monitor platelets by anticoagulation protocol: Yes   Plan:  Will discontinue heparin gtt and begin 1st dose Eliquis immediately after gtt stopped. (No therapeutic days  to remove from acute phase.) Initiate Eliquis 10mg  BID x7d; then 5mg  BID thereafter.  CTM CBC daily while on therapeutic AC and inpatient. Shanon Brow Nariah Morgano 08/27/2020,9:13 AM

## 2020-08-27 NOTE — Progress Notes (Signed)
PT Cancellation Note  Patient Details Name: [name] MRN: [id number] DOB: 03/03/1921   Cancelled Treatment:    Reason Eval/Treat Not Completed: Medical issues which prohibited therapy: Per chart review imaging 08/26/20 stating "Suggestion of filling defect seen in upper lobe branch of right pulmonary artery which may simply represent motion artifact, but the possibility of small peripheral pulmonary embolus cannot be excluded." Pt started on Heparin at 1638 on 08/26/20. Per protocol, will hold PT services x 48 hours from initiation of Heparin.  Will attempt to see pt at a future date/time as medically appropriate.      DNicki Reaper Reedy Biernat PT, DPT 08/27/20, 1:16 PM

## 2020-08-27 NOTE — Progress Notes (Signed)
PROGRESS NOTE  [name] XFG:[id number] DOB: 14-Sep-1920 DOA: 08/21/2020 PCP: Sandra Pink, MD   LOS: 6 days   Brief Narrative / Interim history: [name] a 87 y.o.femalewith medical history significant ofhypertension, GERD, depression, stomach ulcer, GI bleeding, breast cancer (s/p of left mastectomy), remote colon cancer (s/p for colectomy), arthritis, recurrent UTI, who presents with fall, altered mental status and increased urinary frequency.Per patient daughter, patient was very confused with hallucination, she wandered off home, had a fall. X-ray showed a pelvic fracture, no surgical indication. Patient was also had a severe lactic acidosis of 11.0. Leukocytosis of 28. She was given IV fluid in the emergency room, she was covered with meropenem for UTI, and upon improvement of her sepsis physiology she was narrowed to Keflex and completed course.  Hospital stay complicated by worsening hypoxic respiratory failure multifactorial due to possible PE and fluid overload.  Subjective / 24h Interval events: No significant complaints this morning, she denies any shortness of breath but is visibly tachypneic.  Denies any chest pain.  Assessment & Plan: Principal Problem Acute metabolic encephalopathy due to severe sepsis with septic shock secondary to urinary tract infection. Underlying dementia -Sepsis physiology resolved.  Urine cultures showed Klebsiella.  She was initially placed on meropenem but eventually transitioned to Keflex upon speciation.  She completed a 5-day course for UTI -Mental status improving, needs SNF  Active Problems Probable pulmonary embolism causing SVT, RBBB  -Patient has had few runs of SVT and also new onset RBBB this hospitalization, chest x-ray was done which showed some fluid overload.  D-dimer was elevated and underwent a CT angiogram, discussed with radiologist it is unclear but most likely it represents a small PE.  She was placed on  anticoagulation with heparin yesterday, tolerating it well, transition to Eliquis today. -2D echo was done showed normal EF, normal RV, increased pulmonary hypertension  Age-indeterminate DVT -Found on right femoral vein, appears sometimes in the next year as it was not present in 2021.  Anticoagulation as above.  Acute hypoxic respiratory failure likely multifactorial due to acute on chronic diastolic CHF, PE -possibly in the setting of fluid resuscitation, she is still tachypneic this morning.  Repeat chest x-ray today showed improved pulmonary edema but there is some evidence of continued vascular congestion.  Will repeat Lasix today.  Possible lobar pneumonia -On chest x-ray few days ago, repeat this morning without evidence of pneumonia.  Stop antibiotics.  Essential hypertension -Continue metoprolol.  Pelvic fracture - due to mechanical fall. Patient was evaluated by orthopedics, no surgery needed. Needs SNF, placement pending  Scheduled Meds: . apixaban  10 mg Oral BID   Followed by  . [START ON 09/03/2020] apixaban  5 mg Oral BID  . vitamin C  1,000 mg Oral Daily  . cholecalciferol  2,000 Units Oral Daily  . DULoxetine  30 mg Oral Daily  . furosemide  40 mg Intravenous Once  . insulin aspart  0-5 Units Subcutaneous QHS  . insulin aspart  0-9 Units Subcutaneous TID WC  . metoprolol succinate  50 mg Oral Daily  . pantoprazole  40 mg Oral Daily  . pneumococcal 23 valent vaccine  0.5 mL Intramuscular Tomorrow-1000  . polyethylene glycol  17 g Oral Daily  . QUEtiapine  25 mg Oral QHS  . senna-docusate  1 tablet Oral QHS  . vitamin B-12  1,000 mcg Oral Daily   Continuous Infusions: PRN Meds:.acetaminophen, hydrALAZINE, hydrOXYzine, loperamide, methocarbamol, oxyCODONE-acetaminophen  Diet Orders (From admission, onward)  Start     Ordered   08/21/20 1643  Diet Heart Room service appropriate? Yes; Fluid consistency: Thin  Diet effective now       Question Answer Comment   Room service appropriate? Yes   Fluid consistency: Thin      08/21/20 1642          DVT prophylaxis: SCDs Start: 08/22/20 1042 SCDs Start: 08/21/20 1643     Code Status: DNR  Family Communication: Son present at bedside  Status is: Inpatient  Remains inpatient appropriate because:Inpatient level of care appropriate due to severity of illness   Dispo: The patient is from: Home              Anticipated d/c is to: SNF              Patient currently is not medically stable to d/c.   Difficult to place patient No    Level of care: Med-Surg  Consultants:  none  Procedures:  2D echo: pending  Microbiology  Urine cultures - klebsiella  Antimicrobials: Keflex Doxy    Objective: Vitals:   08/26/20 2044 08/26/20 2321 08/27/20 0319 08/27/20 0930  BP: (!) 148/74 116/63 129/60 125/61  Pulse: 79 74 78 76  Resp: (!) 26 (!) 28 (!) 26 20  Temp: 98.2 F (36.8 C) 98.4 F (36.9 C) 98.2 F (36.8 C) 98.3 F (36.8 C)  TempSrc:  Oral  Oral  SpO2: 97% 97% 97% 98%  Weight:      Height:        Intake/Output Summary (Last 24 hours) at 08/27/2020 1029 Last data filed at 08/27/2020 1610 Gross per 24 hour  Intake 305.45 ml  Output 200 ml  Net 105.45 ml   Filed Weights   08/23/20 0900 08/24/20 0500 08/24/20 1022  Weight: 75.8 kg 73 kg 75.7 kg    Examination:  Constitutional: No distress, alert Eyes: No scleral icterus ENMT: Moist mucous membranes Neck: normal, supple Respiratory: Faint bibasilar crackles, no wheezing, tachypneic Cardiovascular: Regular rate and rhythm, no murmurs appreciated, no peripheral edema Abdomen: Abdomen is soft, nontender, nondistended, bowel sounds positive Musculoskeletal: no clubbing / cyanosis.  Skin: No rashes appreciated Neurologic: No focal deficits  Data Reviewed: I have independently reviewed following labs and imaging studies   CBC: Recent Labs  Lab 08/21/20 0738 08/22/20 0545 08/23/20 0259 08/24/20 0623 08/25/20 0811  08/26/20 0454 08/27/20 0454  WBC 28.1*   < > 15.9* 13.0* 11.7* 12.4* 12.1*  NEUTROABS 24.0*  --  12.5* 10.3*  --   --   --   HGB 14.0   < > 11.8* 12.1 12.3 12.0 12.2  HCT 43.0   < > 35.2* 36.5 34.6* 35.7* 37.1  MCV 95.8   < > 93.1 93.1 91.1 92.2 94.6  PLT 300   < > 232 226 272 292 301   < > = values in this interval not displayed.   Basic Metabolic Panel: Recent Labs  Lab 08/21/20 0738 08/22/20 0545 08/23/20 0259 08/24/20 0623 08/25/20 0811 08/26/20 0454 08/27/20 0454  NA 138   < > 134* 136 134* 137 139  K 3.4*   < > 3.6 3.5 3.6 3.4* 4.2  CL 105   < > 102 104 103 100 103  CO2 19*   < > 21* 23 23 26 24   GLUCOSE 279*   < > 126* 155* 140* 152* 131*  BUN 17   < > 16 19 20 23  26*  CREATININE 0.90   < >  0.75 0.71 0.58 0.69 0.74  CALCIUM 8.7*   < > 8.8* 8.7* 8.4* 8.5* 8.7*  MG 2.2  --  2.0 2.0  --   --   --    < > = values in this interval not displayed.   Liver Function Tests: Recent Labs  Lab 08/21/20 0738 08/25/20 0811 08/27/20 0454  AST 29 18 18   ALT 21 16 18   ALKPHOS 68 51 56  BILITOT 1.6* 1.8* 1.6*  PROT 7.3 6.2* 6.4*  ALBUMIN 3.9 3.0* 2.9*   Coagulation Profile: Recent Labs  Lab 08/21/20 0738  INR 1.3*   HbA1C: No results for input(s): HGBA1C in the last 72 hours. CBG: Recent Labs  Lab 08/26/20 0749 08/26/20 1151 08/26/20 1653 08/26/20 2040 08/27/20 0747  GLUCAP 144* 149* 149* 109* 127*    Recent Results (from the past 240 hour(s))  Resp Panel by RT-PCR (Flu A&B, Covid) Nasopharyngeal Swab     Status: None   Collection Time: 08/21/20  7:38 AM   Specimen: Nasopharyngeal Swab; Nasopharyngeal(NP) swabs in vial transport medium  Result Value Ref Range Status   SARS Coronavirus 2 by RT PCR NEGATIVE NEGATIVE Final    Comment: (NOTE) SARS-CoV-2 target nucleic acids are NOT DETECTED.  The SARS-CoV-2 RNA is generally detectable in upper respiratory specimens during the acute phase of infection. The lowest concentration of SARS-CoV-2 viral copies this  assay can detect is 138 copies/mL. A negative result does not preclude SARS-Cov-2 infection and should not be used as the sole basis for treatment or other patient management decisions. A negative result may occur with  improper specimen collection/handling, submission of specimen other than nasopharyngeal swab, presence of viral mutation(s) within the areas targeted by this assay, and inadequate number of viral copies(<138 copies/mL). A negative result must be combined with clinical observations, patient history, and epidemiological information. The expected result is Negative.  Fact Sheet for Patients:  EntrepreneurPulse.com.au  Fact Sheet for Healthcare Providers:  IncredibleEmployment.be  This test is no t yet approved or cleared by the [place] FDA and  has been authorized for detection and/or diagnosis of SARS-CoV-2 by FDA under an Emergency Use Authorization (EUA). This EUA will remain  in effect (meaning this test can be used) for the duration of the COVID-19 declaration under Section 564(b)(1) of the Act, 21 U.S.C.section 360bbb-3(b)(1), unless the authorization is terminated  or revoked sooner.       Influenza A by PCR NEGATIVE NEGATIVE Final   Influenza B by PCR NEGATIVE NEGATIVE Final    Comment: (NOTE) The Xpert Xpress SARS-CoV-2/FLU/RSV plus assay is intended as an aid in the diagnosis of influenza from Nasopharyngeal swab specimens and should not be used as a sole basis for treatment. Nasal washings and aspirates are unacceptable for Xpert Xpress SARS-CoV-2/FLU/RSV testing.  Fact Sheet for Patients: EntrepreneurPulse.com.au  Fact Sheet for Healthcare Providers: IncredibleEmployment.be  This test is not yet approved or cleared by the [place] FDA and has been authorized for detection and/or diagnosis of SARS-CoV-2 by FDA under an Emergency Use Authorization (EUA). This EUA will  remain in effect (meaning this test can be used) for the duration of the COVID-19 declaration under Section 564(b)(1) of the Act, 21 U.S.C. section 360bbb-3(b)(1), unless the authorization is terminated or revoked.  Performed at [facility], [address] [place], [place] [address]   Blood Culture (routine x 2)     Status: None   Collection Time: 08/21/20  7:38 AM   Specimen: BLOOD  Result  Value Ref Range Status   Specimen Description BLOOD RIGHT ASSIST CONTROL  Final   Special Requests   Final    BOTTLES DRAWN AEROBIC AND ANAEROBIC Blood Culture results may not be optimal due to an excessive volume of blood received in culture bottles   Culture   Final    NO GROWTH 5 DAYS Performed at [facility], [address] [place], [place] [address]    Report Status 08/26/2020 FINAL  Final  Blood Culture (routine x 2)     Status: None   Collection Time: 08/21/20  7:38 AM   Specimen: BLOOD  Result Value Ref Range Status   Specimen Description BLOOD LEFT ASSIST CONTROL  Final   Special Requests   Final    BOTTLES DRAWN AEROBIC AND ANAEROBIC Blood Culture results may not be optimal due to an excessive volume of blood received in culture bottles   Culture   Final    NO GROWTH 5 DAYS Performed at [facility], [address] [place], [place] [address]    Report Status 08/26/2020 FINAL  Final  Urine culture     Status: Abnormal   Collection Time: 08/21/20 10:13 AM   Specimen: Urine, Random  Result Value Ref Range Status   Specimen Description   Final    URINE, RANDOM Performed at [facility], [address], [place], [place] [address]    Special Requests   Final    NONE Performed at [facility], [place], [place] [address]    Culture 40,000 COLONIES/mL KLEBSIELLA PNEUMONIAE (A)  Final   Report Status 08/23/2020 FINAL  Final   Organism ID, Bacteria KLEBSIELLA PNEUMONIAE (A)  Final      Susceptibility   Klebsiella  pneumoniae - MIC*    AMPICILLIN >=32 RESISTANT Resistant     CEFAZOLIN <=4 SENSITIVE Sensitive     CEFEPIME <=0.12 SENSITIVE Sensitive     CEFTRIAXONE 0.5 SENSITIVE Sensitive     CIPROFLOXACIN <=0.25 SENSITIVE Sensitive     GENTAMICIN <=1 SENSITIVE Sensitive     IMIPENEM <=0.25 SENSITIVE Sensitive     NITROFURANTOIN 32 SENSITIVE Sensitive     TRIMETH/SULFA <=20 SENSITIVE Sensitive     AMPICILLIN/SULBACTAM 4 SENSITIVE Sensitive     PIP/TAZO <=4 SENSITIVE Sensitive     * 40,000 COLONIES/mL KLEBSIELLA PNEUMONIAE     Radiology Studies: CT ANGIO CHEST PE W OR WO CONTRAST  Result Date: 08/26/2020 CLINICAL DATA:  Altered mental status, fall. EXAM: CT ANGIOGRAPHY CHEST WITH CONTRAST TECHNIQUE: Multidetector CT imaging of the chest was performed using the standard protocol during bolus administration of intravenous contrast. Multiplanar CT image reconstructions and MIPs were obtained to evaluate the vascular anatomy. CONTRAST:  37mL OMNIPAQUE IOHEXOL 350 MG/ML SOLN COMPARISON:  None. FINDINGS: Cardiovascular: There is the suggestion of a filling defect involving and upper lobe branch of the right pulmonary artery, but is uncertain if this represents true pulmonary embolus or potentially motion artifact. No other evidence of pulmonary embolus is noted. Normal cardiac size. No pericardial effusion. Atherosclerosis of thoracic aorta is noted without aneurysm or dissection. Mediastinum/Nodes: Large sliding-type hiatal hernia is noted. Thyroid gland is unremarkable. No definite adenopathy is noted. Lungs/Pleura: Small left pleural effusion is noted with adjacent subsegmental atelectasis. Right lung is clear. No pneumothorax is noted. Upper Abdomen: No acute abnormality. Musculoskeletal: No chest wall abnormality. No acute or significant osseous findings. Review of the MIP images confirms the above findings. IMPRESSION: Suggestion of filling defect seen in upper  lobe branch of right pulmonary artery which may  simply represent motion artifact, but the possibility of small peripheral pulmonary embolus cannot be excluded. There is no evidence of large central pulmonary embolus. Large sliding-type hiatal hernia. Small left pleural effusion with adjacent subsegmental atelectasis. Aortic Atherosclerosis (ICD10-I70.0). Electronically Signed   By: Marijo Conception M.D.   On: 08/26/2020 14:01   US Venous Img Lower Bilateral (DVT)  Result Date: 08/26/2020 CLINICAL DATA:  Bilateral lower extremity edema and pain. Possible right upper lobe pulmonary embolism by CTA today. History of prior DVT in the right popliteal vein and right tibial veins. EXAM: BILATERAL LOWER EXTREMITY VENOUS DOPPLER ULTRASOUND TECHNIQUE: Gray-scale sonography with graded compression, as well as color Doppler and duplex ultrasound were performed to evaluate the lower extremity deep venous systems from the level of the common femoral vein and including the common femoral, femoral, profunda femoral, popliteal and calf veins including the posterior tibial, peroneal and gastrocnemius veins when visible. The superficial great saphenous vein was also interrogated. Spectral Doppler was utilized to evaluate flow at rest and with distal augmentation maneuvers in the common femoral, femoral and popliteal veins. COMPARISON:  Right lower extremity study on 07/18/2019 FINDINGS: RIGHT LOWER EXTREMITY Common Femoral Vein: Circumferential nonocclusive mural thrombus is identified in the right common femoral vein which is echogenic in appearance. This suggests some level of chronicity but was not present on the 07/18/2019 study. Saphenofemoral Junction: The common femoral vein thrombus mentioned above extends up to the saphenofemoral junction but the saphenofemoral junction is open. Profunda Femoral Vein: No evidence of thrombus. Normal compressibility and flow on color Doppler imaging. Femoral Vein: No evidence of thrombus. Normal compressibility, respiratory phasicity and  response to augmentation. Popliteal Vein: No evidence of thrombus. Normal compressibility, respiratory phasicity and response to augmentation. Calf Veins: No evidence of thrombus. Normal compressibility and flow on color Doppler imaging. Superficial Great Saphenous Vein: No evidence of thrombus. Normal compressibility. Venous Reflux:  None. Other Findings: Stable size of right popliteal fossa Baker's cyst measuring up to 4.4 cm in maximal length. LEFT LOWER EXTREMITY Common Femoral Vein: No evidence of thrombus. Normal compressibility, respiratory phasicity and response to augmentation. Saphenofemoral Junction: No evidence of thrombus. Normal compressibility and flow on color Doppler imaging. Profunda Femoral Vein: No evidence of thrombus. Normal compressibility and flow on color Doppler imaging. Femoral Vein: No evidence of thrombus. Normal compressibility, respiratory phasicity and response to augmentation. Popliteal Vein: No evidence of thrombus. Normal compressibility, respiratory phasicity and response to augmentation. Calf Veins: No evidence of thrombus. Normal compressibility and flow on color Doppler imaging. Superficial Great Saphenous Vein: No evidence of thrombus. Normal compressibility. Venous Reflux:  None. Other Findings: No evidence of superficial thrombophlebitis or abnormal fluid collection. IMPRESSION: 1. Circumferential nonocclusive echogenic mural thrombus in the right common femoral vein. This thrombus is echogenic suggesting some degree of chronicity, but was not present on the February, 2021 study. 2. No evidence of left lower extremity deep vein thrombosis. 3. Stable right popliteal fossa Baker's cyst. Electronically Signed   By: Aletta Edouard M.D.   On: 08/26/2020 17:04   DG Chest Port 1 View  Result Date: 08/27/2020 CLINICAL DATA:  87 year old female with dyspnea and sepsis. EXAM: PORTABLE CHEST - 1 VIEW COMPARISON:  08/25/2020 FINDINGS: Unchanged moderate cardiomegaly. Similar  appearing large hiatal hernia. Slightly improved aeration with persistent trace scattered patchy pulmonary opacities. Mild bibasilar subsegmental atelectasis. No significant pleural effusion or evidence of pneumothorax. Atherosclerotic calcification of the aortic arch. No acute osseous  abnormality. IMPRESSION: 1. Improving mild pulmonary edema. 2. No focal consolidations that would suggest pneumonia. 3. Similar appearing large hiatal hernia. 4.  Aortic Atherosclerosis (ICD10-I70.0). Electronically Signed   By: Ruthann Cancer MD   On: 08/27/2020 09:31   Marzetta Board, MD, PhD [facility]  Between 7 am - 7 pm I am available, please contact me via Amion or Securechat  Between 7 pm - 7 am I am not available, please contact night coverage MD/APP via Amion

## 2020-08-28 DIAGNOSIS — R0602 Shortness of breath: Secondary | ICD-10-CM

## 2020-08-28 DIAGNOSIS — I825Y9 Chronic embolism and thrombosis of unspecified deep veins of unspecified proximal lower extremity: Secondary | ICD-10-CM | POA: Diagnosis not present

## 2020-08-28 DIAGNOSIS — I5033 Acute on chronic diastolic (congestive) heart failure: Secondary | ICD-10-CM

## 2020-08-28 LAB — BASIC METABOLIC PANEL
Anion gap: 10 (ref 5–15)
BUN: 28 mg/dL — ABNORMAL HIGH (ref 8–23)
CO2: 26 mmol/L (ref 22–32)
Calcium: 8.7 mg/dL — ABNORMAL LOW (ref 8.9–10.3)
Chloride: 98 mmol/L (ref 98–111)
Creatinine, Ser: 0.71 mg/dL (ref 0.44–1.00)
GFR, Estimated: >60 mL/min (ref >60)
Glucose, Bld: 126 mg/dL — ABNORMAL HIGH (ref 70–99)
Potassium: 3.9 mmol/L (ref 3.5–5.1)
Sodium: 134 mmol/L — ABNORMAL LOW (ref 135–145)

## 2020-08-28 LAB — CBC
HCT: 37.9 % (ref 36.0–46.0)
Hemoglobin: 12.2 g/dL (ref 12.0–15.0)
MCH: 30.7 pg (ref 26.0–34.0)
MCHC: 32.2 g/dL (ref 30.0–36.0)
MCV: 95.5 fL (ref 80.0–100.0)
Platelets: 383 10*3/uL (ref 150–400)
RBC: 3.97 MIL/uL (ref 3.87–5.11)
RDW: 13.8 % (ref 11.5–15.5)
WBC: 11.8 10*3/uL — ABNORMAL HIGH (ref 4.0–10.5)
nRBC: 0 % (ref 0.0–0.2)

## 2020-08-28 LAB — GLUCOSE, CAPILLARY
Glucose-Capillary: 110 mg/dL — ABNORMAL HIGH (ref 70–99)
Glucose-Capillary: 185 mg/dL — ABNORMAL HIGH (ref 70–99)
Glucose-Capillary: 104 mg/dL — ABNORMAL HIGH (ref 70–99)
Glucose-Capillary: 120 mg/dL — ABNORMAL HIGH (ref 70–99)
Glucose-Capillary: 150 mg/dL — ABNORMAL HIGH (ref 70–99)

## 2020-08-28 MED ORDER — POTASSIUM CHLORIDE CRYS ER 20 MEQ PO TBCR
20.0000 meq | EXTENDED_RELEASE_TABLET | Freq: Once | ORAL | Status: AC
  Administered 2020-08-28: 20 meq via ORAL
  Filled 2020-08-28: qty 1

## 2020-08-28 MED ORDER — FUROSEMIDE 10 MG/ML IJ SOLN
40.0000 mg | Freq: Once | INTRAMUSCULAR | Status: AC
  Administered 2020-08-28: 40 mg via INTRAVENOUS
  Filled 2020-08-28: qty 4

## 2020-08-28 NOTE — Progress Notes (Signed)
PROGRESS NOTE  [name] UEA:[id number] DOB: [birth year] DOA: 08/21/2020 PCP: [name], MD   LOS: 7 days   Brief Narrative / Interim history: [name] a 87 y.o.femalewith medical history significant ofhypertension, GERD, depression, stomach ulcer, GI bleeding, breast cancer (s/p of left mastectomy), remote colon cancer (s/p for colectomy), arthritis, recurrent UTI, who presents with fall, altered mental status and increased urinary frequency.Per patient daughter, patient was very confused with hallucination, she wandered off home, had a fall. X-ray showed a pelvic fracture, no surgical indication. Patient was also had a severe lactic acidosis of 11.0. Leukocytosis of 28. She was given IV fluid in the emergency room, she was covered with meropenem for UTI, and upon improvement of her sepsis physiology she was narrowed to Keflex and completed course.  Hospital stay complicated by worsening hypoxic respiratory failure multifactorial due to possible PE and fluid overload.  Subjective / 24h Interval events: Feels well this morning, denies any chest pain, denies any shortness of breath  Assessment & Plan: Principal Problem Acute metabolic encephalopathy due to severe sepsis with septic shock secondary to urinary tract infection. Underlying dementia -Sepsis physiology resolved.  Urine cultures showed Klebsiella.  She was initially placed on meropenem but eventually transitioned to Keflex upon speciation.  She completed a 5-day course for UTI -Mental status improving, needs SNF probably in couple of days once her respiratory status stabilizes  Active Problems Probable pulmonary embolism causing SVT, RBBB  -Patient has had few runs of SVT and also new onset RBBB this hospitalization, chest x-ray was done which showed some fluid overload.  D-dimer was elevated and underwent a CT angiogram, discussed with radiologist it is unclear but most likely it represents a small PE.  She  was initially on heparin infusion, tolerated it well and eventually transitioned to Eliquis -2D echo was done showed normal EF, normal RV, increased pulmonary hypertension  Age-indeterminate DVT -Found on right femoral vein, appears sometimes in the next year as it was not present in 2021.  Anticoagulation as above.  Acute hypoxic respiratory failure likely multifactorial due to acute on chronic diastolic CHF, PE -possibly in the setting of fluid resuscitation, her tachypnea seems to be improving and responding to Lasix.  Renal function is stable and will repeat Lasix x1 today.  Can probably go on oral Lasix tomorrow  Possible lobar pneumonia -On chest x-ray few days ago, repeat on 4/1 without evidence of pneumonia.  Stop antibiotics.  Essential hypertension -Continue metoprolol.  Pelvic fracture - due to mechanical fall. Patient was evaluated by orthopedics, no surgery needed. Needs SNF, placement pending  Scheduled Meds: . apixaban  10 mg Oral BID   Followed by  . [START ON 09/03/2020] apixaban  5 mg Oral BID  . vitamin C  1,000 mg Oral Daily  . cholecalciferol  2,000 Units Oral Daily  . DULoxetine  30 mg Oral Daily  . furosemide  40 mg Intravenous Once  . insulin aspart  0-5 Units Subcutaneous QHS  . insulin aspart  0-9 Units Subcutaneous TID WC  . metoprolol succinate  50 mg Oral Daily  . pantoprazole  40 mg Oral Daily  . pneumococcal 23 valent vaccine  0.5 mL Intramuscular Tomorrow-1000  . polyethylene glycol  17 g Oral Daily  . potassium chloride  20 mEq Oral Once  . QUEtiapine  25 mg Oral QHS  . senna-docusate  1 tablet Oral QHS  . vitamin B-12  1,000 mcg Oral Daily   Continuous Infusions: PRN Meds:.acetaminophen, hydrALAZINE,  hydrOXYzine, loperamide, methocarbamol, oxyCODONE-acetaminophen  Diet Orders (From admission, onward)    Start     Ordered   08/21/20 1643  Diet Heart Room service appropriate? Yes; Fluid consistency: Thin  Diet effective now       Question  Answer Comment  Room service appropriate? Yes   Fluid consistency: Thin      08/21/20 1642          DVT prophylaxis: SCDs Start: 08/22/20 1042 SCDs Start: 08/21/20 1643     Code Status: DNR  Family Communication: No family at bedside this morning  Status is: Inpatient  Remains inpatient appropriate because:Inpatient level of care appropriate due to severity of illness   Dispo: The patient is from: Home              Anticipated d/c is to: SNF              Patient currently is not medically stable to d/c.   Difficult to place patient No  Level of care: Med-Surg  Consultants:  none  Procedures:  2D echo:  1. Left ventricular ejection fraction, by estimation, is 60 to 65%. The left ventricle has normal function. The left ventricle has no regional wall motion abnormalities. There is moderate left ventricular hypertrophy. Left ventricular diastolic parameters are indeterminate.  2. Right ventricular systolic function is normal. The right ventricular size is normal. There is moderately elevated pulmonary artery systolic pressure. The estimated right ventricular systolic pressure is 01.6 mmHg.   Microbiology  Urine cultures - klebsiella  Antimicrobials: Keflex Doxy    Objective: Vitals:   08/27/20 2003 08/27/20 2344 08/28/20 0431 08/28/20 0756  BP: (!) 142/81 140/86 (!) 156/75 (!) 150/103  Pulse: 78 75 80 80  Resp: (!) 21 20 (!) 22   Temp: 98.3 F (36.8 C) 97.7 F (36.5 C) 98.2 F (36.8 C) (!) 97.3 F (36.3 C)  TempSrc:    Oral  SpO2: 98% 97% 95% 96%  Weight:      Height:        Intake/Output Summary (Last 24 hours) at 08/28/2020 1049 Last data filed at 08/28/2020 1034 Gross per 24 hour  Intake 840 ml  Output 1150 ml  Net -310 ml   Filed Weights   08/23/20 0900 08/24/20 0500 08/24/20 1022  Weight: 75.8 kg 73 kg 75.7 kg    Examination:  Constitutional: She is in no distress, alert Eyes: No icterus seen ENMT: mmm Neck: normal, supple Respiratory:  Diminished at the bases but overall clear, no wheezing Cardiovascular: Regular rate and rhythm, no murmurs, trace peripheral edema Abdomen: Soft, nontender, nondistended, bowel sounds positive Musculoskeletal: no clubbing / cyanosis.  Skin: No rashes seen Neurologic: No focal deficits  Data Reviewed: I have independently reviewed following labs and imaging studies   CBC: Recent Labs  Lab 08/23/20 0259 08/24/20 0623 08/25/20 0811 08/26/20 0454 08/27/20 0454 08/28/20 0720  WBC 15.9* 13.0* 11.7* 12.4* 12.1* 11.8*  NEUTROABS 12.5* 10.3*  --   --   --   --   HGB 11.8* 12.1 12.3 12.0 12.2 12.2  HCT 35.2* 36.5 34.6* 35.7* 37.1 37.9  MCV 93.1 93.1 91.1 92.2 94.6 95.5  PLT 232 226 272 292 301 010   Basic Metabolic Panel: Recent Labs  Lab 08/23/20 0259 08/24/20 0623 08/25/20 0811 08/26/20 0454 08/27/20 0454 08/28/20 0720  NA 134* 136 134* 137 139 134*  K 3.6 3.5 3.6 3.4* 4.2 3.9  CL 102 104 103 100 103 98  CO2 21* 23  23 26 24 26   GLUCOSE 126* 155* 140* 152* 131* 126*  BUN 16 19 20 23  26* 28*  CREATININE 0.75 0.71 0.58 0.69 0.74 0.71  CALCIUM 8.8* 8.7* 8.4* 8.5* 8.7* 8.7*  MG 2.0 2.0  --   --   --   --    Liver Function Tests: Recent Labs  Lab 08/25/20 0811 08/27/20 0454  AST 18 18  ALT 16 18  ALKPHOS 51 56  BILITOT 1.8* 1.6*  PROT 6.2* 6.4*  ALBUMIN 3.0* 2.9*   Coagulation Profile: No results for input(s): INR, PROTIME in the last 168 hours. HbA1C: No results for input(s): HGBA1C in the last 72 hours. CBG: Recent Labs  Lab 08/27/20 0747 08/27/20 1141 08/27/20 1653 08/27/20 2122 08/28/20 0724  GLUCAP 127* 120* 164* 130* 110*    Recent Results (from the past 240 hour(s))  Resp Panel by RT-PCR (Flu A&B, Covid) Nasopharyngeal Swab     Status: None   Collection Time: 08/21/20  7:38 AM   Specimen: Nasopharyngeal Swab; Nasopharyngeal(NP) swabs in vial transport medium  Result Value Ref Range Status   SARS Coronavirus 2 by RT PCR NEGATIVE NEGATIVE Final     Comment: (NOTE) SARS-CoV-2 target nucleic acids are NOT DETECTED.  The SARS-CoV-2 RNA is generally detectable in upper respiratory specimens during the acute phase of infection. The lowest concentration of SARS-CoV-2 viral copies this assay can detect is 138 copies/mL. A negative result does not preclude SARS-Cov-2 infection and should not be used as the sole basis for treatment or other patient management decisions. A negative result may occur with  improper specimen collection/handling, submission of specimen other than nasopharyngeal swab, presence of viral mutation(s) within the areas targeted by this assay, and inadequate number of viral copies(<138 copies/mL). A negative result must be combined with clinical observations, patient history, and epidemiological information. The expected result is Negative.  Fact Sheet for Patients:  EntrepreneurPulse.com.au  Fact Sheet for Healthcare Providers:  IncredibleEmployment.be  This test is no t yet approved or cleared by the [place] FDA and  has been authorized for detection and/or diagnosis of SARS-CoV-2 by FDA under an Emergency Use Authorization (EUA). This EUA will remain  in effect (meaning this test can be used) for the duration of the COVID-19 declaration under Section 564(b)(1) of the Act, 21 U.S.C.section 360bbb-3(b)(1), unless the authorization is terminated  or revoked sooner.       Influenza A by PCR NEGATIVE NEGATIVE Final   Influenza B by PCR NEGATIVE NEGATIVE Final    Comment: (NOTE) The Xpert Xpress SARS-CoV-2/FLU/RSV plus assay is intended as an aid in the diagnosis of influenza from Nasopharyngeal swab specimens and should not be used as a sole basis for treatment. Nasal washings and aspirates are unacceptable for Xpert Xpress SARS-CoV-2/FLU/RSV testing.  Fact Sheet for Patients: EntrepreneurPulse.com.au  Fact Sheet for Healthcare  Providers: IncredibleEmployment.be  This test is not yet approved or cleared by the [place] FDA and has been authorized for detection and/or diagnosis of SARS-CoV-2 by FDA under an Emergency Use Authorization (EUA). This EUA will remain in effect (meaning this test can be used) for the duration of the COVID-19 declaration under Section 564(b)(1) of the Act, 21 U.S.C. section 360bbb-3(b)(1), unless the authorization is terminated or revoked.  Performed at [facility], [address] [place], [place] [address]   Blood Culture (routine x 2)     Status: None   Collection Time: 08/21/20  7:38 AM   Specimen: BLOOD  Result Value  Ref Range Status   Specimen Description BLOOD RIGHT ASSIST CONTROL  Final   Special Requests   Final    BOTTLES DRAWN AEROBIC AND ANAEROBIC Blood Culture results may not be optimal due to an excessive volume of blood received in culture bottles   Culture   Final    NO GROWTH 5 DAYS Performed at [facility], [address] [place], [place] [address]    Report Status 08/26/2020 FINAL  Final  Blood Culture (routine x 2)     Status: None   Collection Time: 08/21/20  7:38 AM   Specimen: BLOOD  Result Value Ref Range Status   Specimen Description BLOOD LEFT ASSIST CONTROL  Final   Special Requests   Final    BOTTLES DRAWN AEROBIC AND ANAEROBIC Blood Culture results may not be optimal due to an excessive volume of blood received in culture bottles   Culture   Final    NO GROWTH 5 DAYS Performed at [facility], [address] [place], [place] [address]    Report Status 08/26/2020 FINAL  Final  Urine culture     Status: Abnormal   Collection Time: 08/21/20 10:13 AM   Specimen: Urine, Random  Result Value Ref Range Status   Specimen Description   Final    URINE, RANDOM Performed at [facility], [address], [place], [place] [address]    Special Requests   Final    NONE Performed at  [facility], [place], [place] [address]    Culture 40,000 COLONIES/mL KLEBSIELLA PNEUMONIAE (A)  Final   Report Status 08/23/2020 FINAL  Final   Organism ID, Bacteria KLEBSIELLA PNEUMONIAE (A)  Final      Susceptibility   Klebsiella pneumoniae - MIC*    AMPICILLIN >=32 RESISTANT Resistant     CEFAZOLIN <=4 SENSITIVE Sensitive     CEFEPIME <=0.12 SENSITIVE Sensitive     CEFTRIAXONE 0.5 SENSITIVE Sensitive     CIPROFLOXACIN <=0.25 SENSITIVE Sensitive     GENTAMICIN <=1 SENSITIVE Sensitive     IMIPENEM <=0.25 SENSITIVE Sensitive     NITROFURANTOIN 32 SENSITIVE Sensitive     TRIMETH/SULFA <=20 SENSITIVE Sensitive     AMPICILLIN/SULBACTAM 4 SENSITIVE Sensitive     PIP/TAZO <=4 SENSITIVE Sensitive     * 40,000 COLONIES/mL KLEBSIELLA PNEUMONIAE     Radiology Studies: No results found. Marzetta Board, MD, PhD [facility]  Between 7 am - 7 pm I am available, please contact me via Amion or Securechat  Between 7 pm - 7 am I am not available, please contact night coverage MD/APP via Amion

## 2020-08-28 NOTE — TOC Progression Note (Addendum)
Transition of Care [facility]) - Progression Note    Patient Details  Name: ANNTONETTE MADEWELL MRN: [id number] Date of Birth: 1920-10-08  Transition of Care [facility]) CM/SW [place], LCSW Phone Number: 08/28/2020, 1:02 PM  Clinical Narrative:   Reached out to [name] at [facility] who reported she cannot accept patient. [facility] made a bed offer. CSW left VM for patient's daughter/POA with update. Accepted bed offer at [facility] in HUB and messaged Tammy in Admissions. Plan for [facility] when medically ready.  Expected Discharge Plan: [place] Barriers to Discharge: Continued Medical Work up  Expected Discharge Plan and Services Expected Discharge Plan: [place] In-house Referral: Clinical Social Work   Post Acute Care Choice: [place] Living arrangements for the past 2 months: Single Family Home                           HH Arranged: NA [place] Agency: NA         Social Determinants of Health (SDOH) Interventions    Readmission Risk Interventions Readmission Risk Prevention Plan 08/24/2020  Transportation Screening Complete  PCP or Specialist Appt within 5-7 Days Complete  Home Care Screening Complete  Medication Review (RN CM) Complete  Some recent data might be hidden

## 2020-08-29 DIAGNOSIS — I5033 Acute on chronic diastolic (congestive) heart failure: Secondary | ICD-10-CM | POA: Diagnosis not present

## 2020-08-29 DIAGNOSIS — I825Y9 Chronic embolism and thrombosis of unspecified deep veins of unspecified proximal lower extremity: Secondary | ICD-10-CM | POA: Diagnosis not present

## 2020-08-29 DIAGNOSIS — R0602 Shortness of breath: Secondary | ICD-10-CM | POA: Diagnosis not present

## 2020-08-29 LAB — BASIC METABOLIC PANEL
Anion gap: 9 (ref 5–15)
BUN: 30 mg/dL — ABNORMAL HIGH (ref 8–23)
CO2: 28 mmol/L (ref 22–32)
Calcium: 8.6 mg/dL — ABNORMAL LOW (ref 8.9–10.3)
Chloride: 101 mmol/L (ref 98–111)
Creatinine, Ser: 0.68 mg/dL (ref 0.44–1.00)
GFR, Estimated: >60 mL/min (ref >60)
Glucose, Bld: 133 mg/dL — ABNORMAL HIGH (ref 70–99)
Potassium: 4.4 mmol/L (ref 3.5–5.1)
Sodium: 138 mmol/L (ref 135–145)

## 2020-08-29 LAB — GLUCOSE, CAPILLARY
Glucose-Capillary: 123 mg/dL — ABNORMAL HIGH (ref 70–99)
Glucose-Capillary: 193 mg/dL — ABNORMAL HIGH (ref 70–99)
Glucose-Capillary: 136 mg/dL — ABNORMAL HIGH (ref 70–99)
Glucose-Capillary: 132 mg/dL — ABNORMAL HIGH (ref 70–99)

## 2020-08-29 MED ORDER — FUROSEMIDE 20 MG PO TABS
20.0000 mg | ORAL_TABLET | Freq: Every day | ORAL | Status: DC
  Administered 2020-08-29 – 2020-08-30 (×2): 20 mg via ORAL
  Filled 2020-08-29 (×2): qty 1

## 2020-08-29 NOTE — TOC Progression Note (Signed)
Transition of Care [facility]) - Progression Note    Patient Details  Name: [name] MRN: [id number] Date of Birth: December 21, 1920  Transition of Care [facility]) CM/SW [place], LCSW Phone Number: 08/29/2020, 4:31 PM  Clinical Narrative:   Spoke to MD, patient will hopefully be ready for SNF tomorrow pending labs/mental status/vitals in the morning. CSW started insurance auth for [employer] in [place] out to Tammy at [facility] to see if they will have a bed for patient, waiting for a response. MD to order COVID test if patient is ready tomorrow.   Expected Discharge Plan: [place] Barriers to Discharge: Continued Medical Work up  Expected Discharge Plan and Services Expected Discharge Plan: [place] In-house Referral: Clinical Social Work   Post Acute Care Choice: [place] Living arrangements for the past 2 months: Single Family Home                           HH Arranged: NA [place] Agency: NA         Social Determinants of Health (SDOH) Interventions    Readmission Risk Interventions Readmission Risk Prevention Plan 08/24/2020  Transportation Screening Complete  PCP or Specialist Appt within 5-7 Days Complete  Home Care Screening Complete  Medication Review (RN CM) Complete  Some recent data might be hidden

## 2020-08-29 NOTE — Evaluation (Signed)
Physical Therapy Re-Evaluation Patient Details Name: [name] MRN: [id number] DOB: Dec 18, 1920 Today's Date: 08/29/2020   History of Present Illness  [name] is a 69yoF who comes to [facility] after a fall and preceeding AMS. PMH: HTN, GERD, depression, PUD, GIB, BrCA s/p Lt mastectomy, colon CA s/p colectomy, UTI, dementia. Pt admitted with sepsis 2/2 UTI. Found to have nondisplaced fractures involving the right superior and inferior pubic rami, nondisplaced bilateral sacral ala fractures chronic appearing L4 compression fracture, possible right 7th rib FX. Stay complicated by acute PE and resultant SVT and RBBB; also found to have age indeterminate DVT. Therapy services were deferred for 48hrs to allow for anticoagulation per protocol.  Clinical Impression  Pt admitted with above diagnosis. Pt currently with functional limitations due to the deficits listed below (see "PT Problem List"). Upon entry, pt in bed, awake and agreeable to participate but a bit drowsy this morning- DTR at bedside. The pt is HOH, but pleasant, interactive, follows simple cues well. DTR provides info regarding prior level of function, both in tolerance and independence. MinA for BLE ROM in bed (rt more pain limited than left), maxA to EOB, pt able to sit unsupported, feels well; multiple attempts to rise to standing unsuccessful despite heavy physical assist, ultimately requires maxA from elevated surface to go to chair to allow for upright eating and escape urine-saturated linen. DTR helps set pt up with breakfast. Pt is more cognitively appropriate at this time for therapy, less lethargic and less confused compared to earlier in week, but still requires heavy physical assistance. Patient's performance this date reveals decreased ability, independence, and tolerance in performing all basic mobility required for performance of activities of daily living. Pt requires additional DME, close physical assistance, and cues for safe  participate in mobility. Pt will benefit from skilled PT intervention to increase independence and safety with basic mobility in preparation for discharge to the venue listed below.       Follow Up Recommendations SNF;Supervision for mobility/OOB;Supervision - Intermittent    Equipment Recommendations  None recommended by PT    Recommendations for Other Services       Precautions / Restrictions Precautions Precautions: Fall Restrictions Other Position/Activity Restrictions: WBAT      Mobility  Bed Mobility Overal bed mobility: Needs Assistance Bed Mobility: Sit to Supine;Supine to Sit Rolling: Max assist              Transfers Overall transfer level: Modified independent Equipment used: 1 person hand held assist Transfers: Sit to/from Stand Sit to Stand: Max assist;From elevated surface Stand pivot transfers: Max assist;From elevated surface          Ambulation/Gait Ambulation/Gait assistance:  (too weak to attept at this time.)              Financial trader Rankin (Stroke Patients Only)       Balance                                             Pertinent Vitals/Pain Pain Assessment: Faces Faces Pain Scale: Hurts little more Pain Location: RLE with moblity in bed Pain Intervention(s): Limited activity within patient's tolerance;Repositioned    Home Living Family/patient expects to be discharged to:: Private residence Living Arrangements: Alone Available Help at Discharge: Family;Available PRN/intermittently  Type of Home: House Home Access: Stairs to enter;Ramped entrance Entrance Stairs-Rails: Left Entrance Stairs-Number of Steps: 3 Home Layout: One level Home Equipment: Walker - 4 wheels;Shower seat;Grab bars - tub/shower;Cane - single point      Prior Function Level of Independence: Independent with assistive device(s);Needs assistance   Gait / Transfers Assistance Needed: Mod  I with use of rollator, fairly independent in home;  ADL's / Homemaking Assistance Needed: DTR helps with bathing for safety, helps with meal prep in advance.  Comments: Pt's daughter delivers meals and assists with medication management. Caregiver reports, " She has short term problems"     Hand Dominance   Dominant Hand: Right    Extremity/Trunk Assessment   Upper Extremity Assessment Upper Extremity Assessment: Generalized weakness    Lower Extremity Assessment Lower Extremity Assessment: Generalized weakness       Communication   Communication: No difficulties;HOH  Cognition Arousal/Alertness: Awake/alert Behavior During Therapy: WFL for tasks assessed/performed Overall Cognitive Status: Impaired/Different from baseline Area of Impairment: Memory;Orientation                                      General Comments      Exercises General Exercises - Lower Extremity Heel Slides: AAROM;Both;Supine;5 reps Hip ABduction/ADduction: AAROM;Both;5 reps;Supine   Assessment/Plan    PT Assessment Patient needs continued PT services  PT Problem List Decreased strength;Decreased range of motion;Decreased activity tolerance;Decreased balance;Decreased mobility;Decreased cognition;Decreased knowledge of use of DME;Decreased safety awareness;Decreased knowledge of precautions;Pain       PT Treatment Interventions DME instruction;Gait training;Stair training;Functional mobility training;Therapeutic activities;Therapeutic exercise;Neuromuscular re-education;Balance training;Cognitive remediation;Patient/family education    PT Goals (Current goals can be found in the Care Plan section)  Acute Rehab PT Goals Patient Stated Goal: regain baseline mobility PT Goal Formulation: With patient/family Time For Goal Achievement: 09/12/20 Potential to Achieve Goals: Fair    Frequency 7X/week   Barriers to discharge        Co-evaluation               AM-PAC PT "6  Clicks" Mobility  Outcome Measure Help needed turning from your back to your side while in a flat bed without using bedrails?: Total Help needed moving from lying on your back to sitting on the side of a flat bed without using bedrails?: Total Help needed moving to and from a bed to a chair (including a wheelchair)?: Total Help needed standing up from a chair using your arms (e.g., wheelchair or bedside chair)?: Total Help needed to walk in hospital room?: Total Help needed climbing 3-5 steps with a railing? : Total 6 Click Score: 6    End of Session Equipment Utilized During Treatment: Gait belt Activity Tolerance: Patient tolerated treatment well Patient left: in chair;with family/visitor present;with call bell/phone within reach Nurse Communication: Mobility status PT Visit Diagnosis: Muscle weakness (generalized) (M62.81);Difficulty in walking, not elsewhere classified (R26.2) Pain - Right/Left: Right Pain - part of body:  (hip)    Time: 9767-3419 PT Time Calculation (min) (ACUTE ONLY): 28 min   Charges:   PT Evaluation $PT Re-evaluation: 1 Re-eval PT Treatments $Therapeutic Exercise: 8-22 mins        2:21 PM, 08/29/20 Etta Grandchild, PT, DPT Physical Therapist - [facility]  [phone] ([place])    [place] 08/29/2020, 2:13 PM

## 2020-08-29 NOTE — Progress Notes (Addendum)
PROGRESS NOTE  [name] VQQ:[id number] DOB: 1920/08/11 DOA: 08/21/2020 PCP: [name], MD   LOS: 8 days   Brief Narrative / Interim history: [name] a 87 y.o.femalewith medical history significant ofhypertension, GERD, depression, stomach ulcer, GI bleeding, breast cancer (s/p of left mastectomy), remote colon cancer (s/p for colectomy), arthritis, recurrent UTI, who presents with fall, altered mental status and increased urinary frequency.Per patient daughter, patient was very confused with hallucination, she wandered off home, had a fall. X-ray showed a pelvic fracture, no surgical indication. Patient was also had a severe lactic acidosis of 11.0. Leukocytosis of 28. She was given IV fluid in the emergency room, she was covered with meropenem for UTI, and upon improvement of her sepsis physiology she was narrowed to Keflex and completed course.  Hospital stay complicated by worsening hypoxic respiratory failure multifactorial due to possible PE and fluid overload.  Subjective / 24h Interval events: No chest pain, no shortness of breath.  Had a coughing episode when taking morning meds  Assessment & Plan: Principal Problem Acute metabolic encephalopathy due to severe sepsis with septic shock secondary to urinary tract infection. Underlying dementia -Sepsis physiology resolved.  Urine cultures showed Klebsiella.  She was initially placed on meropenem but eventually transitioned to Keflex upon speciation.  She completed a 5-day course for UTI -Mental status improving, needs SNF hopefully in the next 24 hours if she remains stable  Active Problems Probable pulmonary embolism causing SVT, RBBB  -Patient has had few runs of SVT and also new onset RBBB this hospitalization, chest x-ray was done which showed some fluid overload.  D-dimer was elevated and underwent a CT angiogram, discussed with radiologist it is unclear but most likely it represents a small PE.  She was  initially on heparin infusion, tolerated it well and eventually transitioned to Eliquis -2D echo was done showed normal EF, normal RV, increased pulmonary hypertension  Age-indeterminate DVT -Found on right femoral vein, appears sometimes in the next year as it was not present in 2021.  Anticoagulation as above.  Acute hypoxic respiratory failure likely multifactorial due to acute on chronic diastolic CHF, PE -possibly in the setting of fluid resuscitation, her tachypnea seems to be improving and responding to Lasix.  Received few days of intravenous furosemide.  Placed on oral Lasix today.  Lower extremity edema improved  Possible lobar pneumonia -On chest x-ray few days ago, repeat on 4/1 without evidence of pneumonia and improvement in her pulmonary edema.  Stop antibiotics.  Essential hypertension -Continue metoprolol.  Pelvic fracture - due to mechanical fall. Patient was evaluated by orthopedics, no surgery needed. Needs SNF, placement pending  Scheduled Meds: . apixaban  10 mg Oral BID   Followed by  . [START ON 09/03/2020] apixaban  5 mg Oral BID  . vitamin C  1,000 mg Oral Daily  . cholecalciferol  2,000 Units Oral Daily  . DULoxetine  30 mg Oral Daily  . furosemide  20 mg Oral Daily  . insulin aspart  0-5 Units Subcutaneous QHS  . insulin aspart  0-9 Units Subcutaneous TID WC  . metoprolol succinate  50 mg Oral Daily  . pantoprazole  40 mg Oral Daily  . pneumococcal 23 valent vaccine  0.5 mL Intramuscular Tomorrow-1000  . polyethylene glycol  17 g Oral Daily  . QUEtiapine  25 mg Oral QHS  . senna-docusate  1 tablet Oral QHS  . vitamin B-12  1,000 mcg Oral Daily   Continuous Infusions: PRN Meds:.acetaminophen, hydrALAZINE, hydrOXYzine,  loperamide, methocarbamol, oxyCODONE-acetaminophen  Diet Orders (From admission, onward)    Start     Ordered   08/21/20 1643  Diet Heart Room service appropriate? Yes; Fluid consistency: Thin  Diet effective now       Question Answer  Comment  Room service appropriate? Yes   Fluid consistency: Thin      08/21/20 1642          DVT prophylaxis: SCDs Start: 08/22/20 1042 SCDs Start: 08/21/20 1643     Code Status: DNR  Family Communication: Daughter at bedside this morning  Status is: Inpatient  Remains inpatient appropriate because:Inpatient level of care appropriate due to severity of illness   Dispo: The patient is from: Home              Anticipated d/c is to: SNF              Patient currently is not medically stable to d/c.   Difficult to place patient No  Level of care: Med-Surg  Consultants:  none  Procedures:  2D echo:  1. Left ventricular ejection fraction, by estimation, is 60 to 65%. The left ventricle has normal function. The left ventricle has no regional wall motion abnormalities. There is moderate left ventricular hypertrophy. Left ventricular diastolic parameters are indeterminate.  2. Right ventricular systolic function is normal. The right ventricular size is normal. There is moderately elevated pulmonary artery systolic pressure. The estimated right ventricular systolic pressure is 57.3 mmHg.   Microbiology  Urine cultures - klebsiella  Antimicrobials: Keflex Doxy    Objective: Vitals:   08/28/20 2107 08/29/20 0003 08/29/20 0442 08/29/20 0814  BP: (!) 151/65 (!) 151/85 126/72 (!) 141/63  Pulse: 74 81 72 76  Resp:  20 18 16   Temp: 97.9 F (36.6 C) 99.4 F (37.4 C) 98 F (36.7 C) 98.1 F (36.7 C)  TempSrc: Oral  Oral   SpO2: 98% 94% 98% 97%  Weight:      Height:        Intake/Output Summary (Last 24 hours) at 08/29/2020 1057 Last data filed at 08/29/2020 2202 Gross per 24 hour  Intake 120 ml  Output 200 ml  Net -80 ml   Filed Weights   08/23/20 0900 08/24/20 0500 08/24/20 1022  Weight: 75.8 kg 73 kg 75.7 kg    Examination:  Constitutional: NAD, alert Eyes: No icterus ENMT: mmm Neck: normal, supple Respiratory: No wheezing heart, slightly rhonchorous with  clearing with coughing Cardiovascular: Regular rate and rhythm, no murmurs, trace edema Abdomen: Soft, nondistended, bowel sounds positive Musculoskeletal: no clubbing / cyanosis.  Skin: No rashes seen Neurologic: Nonfocal, equal strength  Data Reviewed: I have independently reviewed following labs and imaging studies   CBC: Recent Labs  Lab 08/23/20 0259 08/24/20 0623 08/25/20 0811 08/26/20 0454 08/27/20 0454 08/28/20 0720  WBC 15.9* 13.0* 11.7* 12.4* 12.1* 11.8*  NEUTROABS 12.5* 10.3*  --   --   --   --   HGB 11.8* 12.1 12.3 12.0 12.2 12.2  HCT 35.2* 36.5 34.6* 35.7* 37.1 37.9  MCV 93.1 93.1 91.1 92.2 94.6 95.5  PLT 232 226 272 292 301 542   Basic Metabolic Panel: Recent Labs  Lab 08/23/20 0259 08/24/20 0623 08/25/20 0811 08/26/20 0454 08/27/20 0454 08/28/20 0720 08/29/20 0634  NA 134* 136 134* 137 139 134* 138  K 3.6 3.5 3.6 3.4* 4.2 3.9 4.4  CL 102 104 103 100 103 98 101  CO2 21* 23 23 26 24 26 28   GLUCOSE  126* 155* 140* 152* 131* 126* 133*  BUN 16 19 20 23  26* 28* 30*  CREATININE 0.75 0.71 0.58 0.69 0.74 0.71 0.68  CALCIUM 8.8* 8.7* 8.4* 8.5* 8.7* 8.7* 8.6*  MG 2.0 2.0  --   --   --   --   --    Liver Function Tests: Recent Labs  Lab 08/25/20 0811 08/27/20 0454  AST 18 18  ALT 16 18  ALKPHOS 51 56  BILITOT 1.8* 1.6*  PROT 6.2* 6.4*  ALBUMIN 3.0* 2.9*   Coagulation Profile: No results for input(s): INR, PROTIME in the last 168 hours. HbA1C: No results for input(s): HGBA1C in the last 72 hours. CBG: Recent Labs  Lab 08/28/20 1135 08/28/20 1516 08/28/20 1647 08/28/20 2110 08/29/20 0755  GLUCAP 185* 104* 120* 150* 123*    Recent Results (from the past 240 hour(s))  Resp Panel by RT-PCR (Flu A&B, Covid) Nasopharyngeal Swab     Status: None   Collection Time: 08/21/20  7:38 AM   Specimen: Nasopharyngeal Swab; Nasopharyngeal(NP) swabs in vial transport medium  Result Value Ref Range Status   SARS Coronavirus 2 by RT PCR NEGATIVE NEGATIVE  Final    Comment: (NOTE) SARS-CoV-2 target nucleic acids are NOT DETECTED.  The SARS-CoV-2 RNA is generally detectable in upper respiratory specimens during the acute phase of infection. The lowest concentration of SARS-CoV-2 viral copies this assay can detect is 138 copies/mL. A negative result does not preclude SARS-Cov-2 infection and should not be used as the sole basis for treatment or other patient management decisions. A negative result may occur with  improper specimen collection/handling, submission of specimen other than nasopharyngeal swab, presence of viral mutation(s) within the areas targeted by this assay, and inadequate number of viral copies(<138 copies/mL). A negative result must be combined with clinical observations, patient history, and epidemiological information. The expected result is Negative.  Fact Sheet for Patients:  EntrepreneurPulse.com.au  Fact Sheet for Healthcare Providers:  IncredibleEmployment.be  This test is no t yet approved or cleared by the [place] FDA and  has been authorized for detection and/or diagnosis of SARS-CoV-2 by FDA under an Emergency Use Authorization (EUA). This EUA will remain  in effect (meaning this test can be used) for the duration of the COVID-19 declaration under Section 564(b)(1) of the Act, 21 U.S.C.section 360bbb-3(b)(1), unless the authorization is terminated  or revoked sooner.       Influenza A by PCR NEGATIVE NEGATIVE Final   Influenza B by PCR NEGATIVE NEGATIVE Final    Comment: (NOTE) The Xpert Xpress SARS-CoV-2/FLU/RSV plus assay is intended as an aid in the diagnosis of influenza from Nasopharyngeal swab specimens and should not be used as a sole basis for treatment. Nasal washings and aspirates are unacceptable for Xpert Xpress SARS-CoV-2/FLU/RSV testing.  Fact Sheet for Patients: EntrepreneurPulse.com.au  Fact Sheet for Healthcare  Providers: IncredibleEmployment.be  This test is not yet approved or cleared by the [place] FDA and has been authorized for detection and/or diagnosis of SARS-CoV-2 by FDA under an Emergency Use Authorization (EUA). This EUA will remain in effect (meaning this test can be used) for the duration of the COVID-19 declaration under Section 564(b)(1) of the Act, 21 U.S.C. section 360bbb-3(b)(1), unless the authorization is terminated or revoked.  Performed at [facility], [address] [place], [place] [address]   Blood Culture (routine x 2)     Status: None   Collection Time: 08/21/20  7:38 AM   Specimen: BLOOD  Result  Value Ref Range Status   Specimen Description BLOOD RIGHT ASSIST CONTROL  Final   Special Requests   Final    BOTTLES DRAWN AEROBIC AND ANAEROBIC Blood Culture results may not be optimal due to an excessive volume of blood received in culture bottles   Culture   Final    NO GROWTH 5 DAYS Performed at [facility], [address] [place], [place] [address]    Report Status 08/26/2020 FINAL  Final  Blood Culture (routine x 2)     Status: None   Collection Time: 08/21/20  7:38 AM   Specimen: BLOOD  Result Value Ref Range Status   Specimen Description BLOOD LEFT ASSIST CONTROL  Final   Special Requests   Final    BOTTLES DRAWN AEROBIC AND ANAEROBIC Blood Culture results may not be optimal due to an excessive volume of blood received in culture bottles   Culture   Final    NO GROWTH 5 DAYS Performed at [facility], [address] [place], [place] [address]    Report Status 08/26/2020 FINAL  Final  Urine culture     Status: Abnormal   Collection Time: 08/21/20 10:13 AM   Specimen: Urine, Random  Result Value Ref Range Status   Specimen Description   Final    URINE, RANDOM Performed at [facility], [address] [place], [place] [address]    Special Requests   Final    NONE Performed at  [facility], [place], [place] [address]    Culture 40,000 COLONIES/mL KLEBSIELLA PNEUMONIAE (A)  Final   Report Status 08/23/2020 FINAL  Final   Organism ID, Bacteria KLEBSIELLA PNEUMONIAE (A)  Final      Susceptibility   Klebsiella pneumoniae - MIC*    AMPICILLIN >=32 RESISTANT Resistant     CEFAZOLIN <=4 SENSITIVE Sensitive     CEFEPIME <=0.12 SENSITIVE Sensitive     CEFTRIAXONE 0.5 SENSITIVE Sensitive     CIPROFLOXACIN <=0.25 SENSITIVE Sensitive     GENTAMICIN <=1 SENSITIVE Sensitive     IMIPENEM <=0.25 SENSITIVE Sensitive     NITROFURANTOIN 32 SENSITIVE Sensitive     TRIMETH/SULFA <=20 SENSITIVE Sensitive     AMPICILLIN/SULBACTAM 4 SENSITIVE Sensitive     PIP/TAZO <=4 SENSITIVE Sensitive     * 40,000 COLONIES/mL KLEBSIELLA PNEUMONIAE     Radiology Studies: No results found. Marzetta Board, MD, PhD [facility]  Between 7 am - 7 pm I am available, please contact me via Amion or Securechat  Between 7 pm - 7 am I am not available, please contact night coverage MD/APP via Amion

## 2020-08-29 NOTE — Progress Notes (Signed)
PT Cancellation Note  Patient Details Name: LORI-ANN LINDFORS MRN: [id number] DOB: 01-12-1921   Cancelled Treatment:    Reason Eval/Treat Not Completed: Medical issues which prohibited therapy;Patient not medically ready (Pt still within 48 hours of theraeutic antipcoagulation period s/p acute PE. Will defer PT services until medically appropriate per facility guidelines.)   Alan Drummer C 08/29/2020, 8:19 AM

## 2020-08-30 DIAGNOSIS — Z515 Encounter for palliative care: Secondary | ICD-10-CM

## 2020-08-30 DIAGNOSIS — R6521 Severe sepsis with septic shock: Secondary | ICD-10-CM | POA: Diagnosis not present

## 2020-08-30 DIAGNOSIS — S32415A Nondisplaced fracture of anterior wall of left acetabulum, initial encounter for closed fracture: Secondary | ICD-10-CM | POA: Diagnosis not present

## 2020-08-30 DIAGNOSIS — G9341 Metabolic encephalopathy: Secondary | ICD-10-CM | POA: Diagnosis not present

## 2020-08-30 DIAGNOSIS — A419 Sepsis, unspecified organism: Secondary | ICD-10-CM | POA: Diagnosis not present

## 2020-08-30 DIAGNOSIS — M79604 Pain in right leg: Secondary | ICD-10-CM | POA: Diagnosis not present

## 2020-08-30 DIAGNOSIS — Z7189 Other specified counseling: Secondary | ICD-10-CM | POA: Diagnosis not present

## 2020-08-30 DIAGNOSIS — M79605 Pain in left leg: Secondary | ICD-10-CM

## 2020-08-30 LAB — RESP PANEL BY RT-PCR (FLU A&B, COVID) ARPGX2
Influenza A by PCR: NEGATIVE
Influenza B by PCR: NEGATIVE
SARS Coronavirus 2 by RT PCR: NEGATIVE

## 2020-08-30 LAB — BASIC METABOLIC PANEL
Anion gap: 8 (ref 5–15)
BUN: 26 mg/dL — ABNORMAL HIGH (ref 8–23)
CO2: 27 mmol/L (ref 22–32)
Calcium: 8.6 mg/dL — ABNORMAL LOW (ref 8.9–10.3)
Chloride: 100 mmol/L (ref 98–111)
Creatinine, Ser: 0.69 mg/dL (ref 0.44–1.00)
GFR, Estimated: >60 mL/min (ref >60)
Glucose, Bld: 138 mg/dL — ABNORMAL HIGH (ref 70–99)
Potassium: 4.2 mmol/L (ref 3.5–5.1)
Sodium: 135 mmol/L (ref 135–145)

## 2020-08-30 LAB — GLUCOSE, CAPILLARY
Glucose-Capillary: 141 mg/dL — ABNORMAL HIGH (ref 70–99)
Glucose-Capillary: 177 mg/dL — ABNORMAL HIGH (ref 70–99)

## 2020-08-30 LAB — CBC
HCT: 35.7 % — ABNORMAL LOW (ref 36.0–46.0)
Hemoglobin: 11.5 g/dL — ABNORMAL LOW (ref 12.0–15.0)
MCH: 30.7 pg (ref 26.0–34.0)
MCHC: 32.2 g/dL (ref 30.0–36.0)
MCV: 95.2 fL (ref 80.0–100.0)
Platelets: 379 10*3/uL (ref 150–400)
RBC: 3.75 MIL/uL — ABNORMAL LOW (ref 3.87–5.11)
RDW: 13.6 % (ref 11.5–15.5)
WBC: 12.5 10*3/uL — ABNORMAL HIGH (ref 4.0–10.5)
nRBC: 0 % (ref 0.0–0.2)

## 2020-08-30 MED ORDER — FUROSEMIDE 20 MG PO TABS: 20.0000 mg | ORAL_TABLET | Freq: Every day | ORAL | Status: DC

## 2020-08-30 MED ORDER — APIXABAN 5 MG PO TABS
ORAL_TABLET | ORAL | Status: DC
Start: 2020-08-30 — End: 2020-11-19

## 2020-08-30 NOTE — Progress Notes (Signed)
[facility]) Hospital Liaison RN note:  Received new referral for [facility] out patient palliative program to follow post discharge from [place], NP and [place], TOC. Patient information given to referral. Plan is to discharge to [facility] today.  Thank you for this referral.  Zandra Abts, RN [facility] Liaison [phone]

## 2020-08-30 NOTE — Plan of Care (Signed)

## 2020-08-30 NOTE — Progress Notes (Signed)
Daily Progress Note   Patient Name: [name]       Date: 08/30/2020 DOB: [birth year]  Age: 87 y.o. MRN#: [id number] Attending Physician: Caren Griffins, MD Primary Care Physician: [name], MD Admit Date: 08/21/2020  Reason for Consultation/Follow-up: Establishing goals of care  Subjective: Patient is sitting in bedside chair. She denies pain at this time. She states she feels worse than she did when she came in but is unable to articulate why. She has pictures at bedside of great-grandchildren and smiles when she tells me about them. She states QOL is very important. She states "I'm almost past going" and states it is getting more difficult to do for herself. She indicates she is amenable to trying rehab but does not want to live in a nursing facility. She states when she can no longer have her previous QOL (prior to admission) she would no longer want life prolonging interventions.  Spoke with daughter. She understands her mother's wishes. Discussed having palliative follow at SNF with transition to hospice when ready depending on how she does.   Length of Stay: 9  Current Medications: Scheduled Meds:  . apixaban  10 mg Oral BID   Followed by  . [START ON 09/03/2020] apixaban  5 mg Oral BID  . vitamin C  1,000 mg Oral Daily  . cholecalciferol  2,000 Units Oral Daily  . DULoxetine  30 mg Oral Daily  . furosemide  20 mg Oral Daily  . insulin aspart  0-5 Units Subcutaneous QHS  . insulin aspart  0-9 Units Subcutaneous TID WC  . metoprolol succinate  50 mg Oral Daily  . pantoprazole  40 mg Oral Daily  . pneumococcal 23 valent vaccine  0.5 mL Intramuscular Tomorrow-1000  . polyethylene glycol  17 g Oral Daily  . QUEtiapine  25 mg Oral QHS  . senna-docusate  1 tablet Oral QHS  .  vitamin B-12  1,000 mcg Oral Daily    Continuous Infusions:   PRN Meds: acetaminophen, hydrALAZINE, hydrOXYzine, loperamide, methocarbamol, oxyCODONE-acetaminophen  Physical Exam Pulmonary:     Effort: Pulmonary effort is normal.  Neurological:     Mental Status: She is alert.             Vital Signs: BP 124/64   Pulse 84   Temp 98.1 F (36.7  C)   Resp 20   Ht 5\' 3"  (1.6 m)   Wt 75.7 kg   SpO2 99%   BMI 29.55 kg/m  SpO2: SpO2: 99 % O2 Device: O2 Device: Nasal Cannula O2 Flow Rate: O2 Flow Rate (L/min): 3 L/min  Intake/output summary:   Intake/Output Summary (Last 24 hours) at 08/30/2020 1123 Last data filed at 08/30/2020 1036 Gross per 24 hour  Intake 840 ml  Output 300 ml  Net 540 ml   LBM: Last BM Date: 08/26/20 Baseline Weight: Weight: 65.8 kg Most recent weight: Weight: 75.7 kg         Patient Active Problem List   Diagnosis Date Noted  . Severe sepsis with septic shock ([place]) 08/21/2020  . Depression 08/21/2020  . Acute metabolic encephalopathy 36/46/8032  . Fall at home, initial encounter 08/21/2020  . Pelvic ring fracture ([place]) 08/21/2020  . Hypokalemia 08/21/2020  . Hyperglycemia 08/21/2020  . Functional diarrhea 07/29/2019  . Open wound of left knee, leg, and ankle with complication 05/21/8249  . Chest wall pain 10/08/2017  . History of recurrent urinary tract infection 02/03/2017  . Vaginal pessary in situ 02/03/2017  . Iron deficiency anemia due to chronic blood loss 12/01/2016  . Personal history of malignant neoplasm of breast 11/09/2016  . GI bleed 08/21/2016  . Hereditary and idiopathic peripheral neuropathy 09/09/2015  . Malignant neoplasm of left female breast ([place]) 09/09/2015  . Prolapse of vaginal wall with midline cystocele 08/10/2015  . Incomplete bladder emptying 08/10/2015  . Midline low back pain without sciatica 08/10/2015  . Fecal incontinence 08/10/2015  . Recurrent UTI 07/19/2015  . Atrophic vaginitis 07/19/2015  .  Adenomatous colon polyp 02/24/2015  . Anemia 02/24/2015  . Chronic sinusitis 02/24/2015  . Diverticulosis 02/24/2015  . Esophagitis 02/24/2015  . GERD (gastroesophageal reflux disease) 02/24/2015  . Hiatal hernia 02/24/2015  . Breast cancer, left breast ([place]) 09/05/2012  . Hypertension     Palliative Care Assessment & Plan    Recommendations/Plan: Recommend palliative to follow at SNF.     Code Status:    Code Status Orders  (From admission, onward)         Start     Ordered   08/21/20 1344  Do not attempt resuscitation (DNR)  Continuous        08/21/20 1343        Code Status History    Date Active Date Inactive Code Status Order ID Comments User Context   08/21/2020 1342 08/21/2020 1343 DNR [id number]  Ivor Costa, MD ED   11/26/2016 1201 11/28/2016 1637 DNR [id number]  Demetrios Loll, MD Inpatient   08/21/2016 1824 08/25/2016 1857 Full Code [id number]  Epifanio Lesches, MD ED   Advance Care Planning Activity      Prognosis: Poor overall    Thank you for allowing the Palliative Medicine Team to assist in the care of this patient.   Total Time *15 min Prolonged Time Billed no       Greater than 50%  of this time was spent counseling and coordinating care related to the above assessment and plan.  Asencion Gowda, NP  Please contact Palliative Medicine Team phone at [phone] for questions and concerns.

## 2020-08-30 NOTE — Progress Notes (Signed)
Physical Therapy Treatment Patient Details Name: [name] MRN: [id number] DOB: Dec 25, 1920 Today's Date: 08/30/2020    History of Present Illness [name] is a 60yoF who comes to [facility] after a fall and preceeding AMS. PMH: HTN, GERD, depression, PUD, GIB, BrCA s/p Lt mastectomy, colon CA s/p colectomy, UTI, dementia. Pt admitted with sepsis 2/2 UTI. Found to have nondisplaced fractures involving the right superior and inferior pubic rami, nondisplaced bilateral sacral ala fractures chronic appearing L4 compression fracture, possible right 7th rib FX. Stay complicated by acute PE and resultant SVT and RBBB; also found to have age indeterminate DVT. Therapy services were deferred for 48hrs to allow for anticoagulation per protocol.    PT Comments    Pt is making gradual progress towards goals with ability to SPT to chair. Needs +2 assist for safety and demonstrates decreased strength. Unable to effectively stand with RW with heavy post leaning. Good endurance with there-ex, needs heavy cues for performance. All mobility performed on RA with sats at 91%. Will continue to progress as able.   Follow Up Recommendations  SNF     Equipment Recommendations  None recommended by PT    Recommendations for Other Services       Precautions / Restrictions Precautions Precautions: Fall Restrictions Weight Bearing Restrictions: Yes RLE Weight Bearing: Weight bearing as tolerated    Mobility  Bed Mobility Overal bed mobility: Needs Assistance Bed Mobility: Sit to Supine;Supine to Sit     Supine to sit: Mod assist     General bed mobility comments: needs cues for sequencing, takes increased time for performance. Once seated at EOB, able to sit with supervision. O2 removed for mobility    Transfers Overall transfer level: Needs assistance Equipment used: 2 person hand held assist Transfers: Sit to/from Stand Sit to Stand: Max assist;From elevated surface Stand pivot transfers: Max  assist;From elevated surface       General transfer comment: multiple attempts for transfers performed including using RW. Due to deficits, +2 required and able to perform SPT to recliner.  Ambulation/Gait             General Gait Details: unable to perform this date   Stairs             Wheelchair Mobility    Modified Rankin (Stroke Patients Only)       Balance Overall balance assessment: Needs assistance Sitting-balance support: Feet unsupported;Single extremity supported Sitting balance-Leahy Scale: Fair     Standing balance support: Bilateral upper extremity supported Standing balance-Leahy Scale: Zero                              Cognition Arousal/Alertness: Awake/alert Behavior During Therapy: WFL for tasks assessed/performed Overall Cognitive Status: Impaired/Different from baseline                                 General Comments: pleasant, HOH, confused      Exercises Other Exercises Other Exercises: supine ther-ex performed on B LE including SAQ, SLRs, hip abd/add. All ther-ex performed x 10 reps with max assist due to confusion. O2 sats WNL while on RA    General Comments        Pertinent Vitals/Pain Pain Assessment: Faces Faces Pain Scale: Hurts a little bit Pain Location: RLE with moblity in bed Pain Descriptors / Indicators: Sore;Tender Pain Intervention(s): Limited activity within patient's tolerance  Home Living                      Prior Function            PT Goals (current goals can now be found in the care plan section) Acute Rehab PT Goals Patient Stated Goal: regain baseline mobility PT Goal Formulation: With patient Time For Goal Achievement: 09/12/20 Potential to Achieve Goals: Fair Progress towards PT goals: Progressing toward goals    Frequency    7X/week      PT Plan Current plan remains appropriate    Co-evaluation              AM-PAC PT "6 Clicks"  Mobility   Outcome Measure  Help needed turning from your back to your side while in a flat bed without using bedrails?: Total Help needed moving from lying on your back to sitting on the side of a flat bed without using bedrails?: Total Help needed moving to and from a bed to a chair (including a wheelchair)?: Total Help needed standing up from a chair using your arms (e.g., wheelchair or bedside chair)?: Total Help needed to walk in hospital room?: Total Help needed climbing 3-5 steps with a railing? : Total 6 Click Score: 6    End of Session Equipment Utilized During Treatment: Gait belt Activity Tolerance: Patient tolerated treatment well Patient left: in chair;with chair alarm set Nurse Communication: Mobility status PT Visit Diagnosis: Muscle weakness (generalized) (M62.81);Difficulty in walking, not elsewhere classified (R26.2) Pain - Right/Left: Right Pain - part of body:  (hip)     Time: 2233-6122 PT Time Calculation (min) (ACUTE ONLY): 28 min  Charges:  $Therapeutic Exercise: 8-22 mins $Therapeutic Activity: 8-22 mins                     Greggory Stallion, PT, DPT [phone]    [name] 08/30/2020, 10:48 AM

## 2020-08-30 NOTE — TOC Transition Note (Signed)
Transition of Care [facility]) - CM/SW Discharge Note   Patient Details  Name: [name] MRN: [id number] Date of Birth: 09/13/1920  Transition of Care [facility]) CM/SW Contact:  Beverly Sessions, RN Phone Number: 08/30/2020, 2:00 PM   Clinical Narrative:    Patient to discharge to [facility] today Son and daughter notified EMS packet and DNR on chart DC info sent in [place] Repeat covid test negative  EMS transport called    Final next level of care: Skilled Nursing Facility Barriers to Discharge: No Barriers Identified   Patient Goals and CMS Choice Patient states their goals for this hospitalization and ongoing recovery are:: To SNF per daughter/POA Anibal [place]. CMS Medicare.gov Compare Post Acute Care list provided to:: Legal Guardian Choice offered to / list presented to : Adult Children  Discharge Placement                Patient to be transferred to facility by: EMS Name of family member notified: daughter Patient and family notified of of transfer: 08/30/20  Discharge Plan and Services In-house Referral: Clinical Social Work   Post Acute Care Choice: [place]                    HH Arranged: NA [place] Agency: NA        Social Determinants of Health (SDOH) Interventions     Readmission Risk Interventions Readmission Risk Prevention Plan 08/30/2020 08/24/2020  Transportation Screening Complete Complete  PCP or Specialist Appt within 5-7 Days - Complete  PCP or Specialist Appt within 3-5 Days (No Data) -  Home Care Screening - Complete  Medication Review (RN CM) - Complete  HRI or Home Care Consult (No Data) -  Palliative Care Screening Complete -  Medication Review (RN Care Manager) Complete -  Some recent data might be hidden

## 2020-08-30 NOTE — Care Management Important Message (Signed)
Important Message  Patient Details  Name: [name] MRN: [id number] Date of Birth: [birth year]   Medicare Important Message Given:  Yes     Dannette Barbara 08/30/2020, 12:09 PM

## 2020-08-30 NOTE — TOC Progression Note (Signed)
Transition of Care [facility]) - Progression Note    Patient Details  Name: [name] MRN: [id number] Date of Birth: [birth year]  Transition of Care [facility]) CM/SW Contact  Beverly Sessions, RN Phone Number: 08/30/2020, 9:43 AM  Clinical Narrative:     Rapid covid test pending I have reached out to Tammy with [facility] to determine if there is a bed available today Per [employer] portal auth still pending  Expected Discharge Plan: [place] Barriers to Discharge: Continued Medical Work up  Expected Discharge Plan and Services Expected Discharge Plan: [place] In-house Referral: Clinical Social Work   Post Acute Care Choice: [place] Living arrangements for the past 2 months: Single Family Home                           HH Arranged: NA [place] Agency: NA         Social Determinants of Health (SDOH) Interventions    Readmission Risk Interventions Readmission Risk Prevention Plan 08/24/2020  Transportation Screening Complete  PCP or Specialist Appt within 5-7 Days Complete  Home Care Screening Complete  Medication Review (RN CM) Complete  Some recent data might be hidden

## 2020-08-30 NOTE — Progress Notes (Signed)
  Discharge instructions reviewed with family and report called to [facility].  Ivs removed and sites benign.

## 2020-08-30 NOTE — Discharge Summary (Signed)
Physician Discharge Summary  [name] ZOX:[id number] DOB: 19-Dec-1920 DOA: 08/21/2020  PCP: [name], MD  Admit date: 08/21/2020 Discharge date: 08/30/2020  Admitted From: home Disposition:  SNF  Recommendations for Outpatient Follow-up:  1. Follow up with PCP in 1-2 weeks 2. Repeat BMP / CBC in 1 week 3. Recommend palliative care follow up as an outpatient  4. Continue Eliquis 10 mg BID for 3 more days then switch to 5 mg BID  Home Health: none Equipment/Devices: none  Discharge Condition: stable CODE STATUS: DNR Diet recommendation: regular  HPI: Per admitting MD, [name] is a 87 y.o. female with medical history significant of hypertension, GERD, depression, stomach ulcer, GI bleeding, breast cancer (s/p of left mastectomy), remote colon cancer (s/p for colectomy), arthritis, recurrent UTI, who presents with fall, altered mental status and increased urinary frequency. Per his son at bedside, patient has been confused since yesterday.  Normally patient is oriented x3.  Today she is not oriented to time, but still recognize her son and knows that she is in hospital currently. Pt has hallucination. No facial droop or slurred speech.  She moves all extremities normally. Per her son, patient was found outside on driveway and was cold to touch today. She complains of left hip pain. Patient does not seem to have chest pain, cough, shortness of breath.  No fever or chills.  Patient has chronic intermittent mild diarrhea which has not changed.  No nausea, vomiting or abdominal pain.  Patient has increased urinary frequency.  Recently she just finished a course of antibiotics for UTI a week ago.  Hospital Course / Discharge diagnoses: Principal Problem Acute metabolic encephalopathy due to severe sepsis with septic shock secondary to urinary tract infection. Underlying dementia -Sepsis physiology resolved.  Urine cultures showed Klebsiella.  She was initially placed on meropenem  but eventually transitioned to Keflex upon speciation.  She completed a 5-day course for UTI. Mental status improved  Active Problems Probable pulmonary embolism causing SVT, RBBB -Patient has had few runs of SVT and also new onset RBBB this hospitalization, chest x-ray was done which showed some fluid overload.  D-dimer was elevated and underwent a CT angiogram, discussed with radiologist it is unclear but most likely it represents a small PE.  She was initially on heparin infusion, tolerated it well and eventually transitioned to Eliquis. 2D echo was done showed normal EF, normal RV, increased pulmonary hypertension Age-indeterminate DVT -Found on right femoral vein, appears sometimes in the next year as it was not present in 2021.  Anticoagulation as above. Acute hypoxic respiratory failure likely multifactorial due to acute on chronic diastolic CHF, PE -possibly in the setting of fluid resuscitation, her tachypnea seems to be improving and responding to Lasix.  Received few days of intravenous furosemide.  Placed on oral Lasix, continue for 3 more days and reassess renal function and further diuretic needs Lobar pneumonia ruled out -On chest x-ray few days ago there was a concern for pneumonia, however after diuresis a repeat CXR on 4/1 was without evidence of pneumonia. Stable off antibiotics.  Essential hypertension -Continue metoprolol. Pelvic fracture - due to mechanical fall. Patient was evaluated by orthopedics, no surgery needed. SNF / rehab     Discharge Instructions   Allergies as of 08/30/2020      Reactions   Codeine Nausea And Vomiting      Medication List    STOP taking these medications   amoxicillin-clavulanate 875-125 MG tablet Commonly known as: AUGMENTIN  TAKE these medications   apixaban 5 MG Tabs tablet Commonly known as: ELIQUIS 10 mg BID for 3 more days then switch to 5 mg BID   Cranberry 500 MG Caps Take by mouth daily.   DULoxetine 30 MG  capsule Commonly known as: CYMBALTA Take 30 mg by mouth daily.   furosemide 20 MG tablet Commonly known as: LASIX Take 1 tablet (20 mg total) by mouth daily for 3 days. Start taking on: August 31, 2020   loperamide 2 MG tablet Commonly known as: IMODIUM A-D Take 2 mg by mouth as needed for diarrhea or loose stools.   Melatonin 5 MG Caps Take 1 capsule by mouth.   metoprolol succinate 25 MG 24 hr tablet Commonly known as: TOPROL-XL Take 25 mg by mouth daily.   pantoprazole 40 MG tablet Commonly known as: PROTONIX TAKE 1 TABLET BY MOUTH ONCE DAILY   Potassium 99 MG Tabs Take 1 tablet by mouth daily.   PROBIOTIC COLIC PO Take 1 Dose by mouth daily. doterra brands   vitamin B-12 1000 MCG tablet Commonly known as: CYANOCOBALAMIN Take 1,000 mcg by mouth daily.   vitamin C 1000 MG tablet Take 1,000 mg by mouth daily.   Vitamin D 50 MCG (2000 UT) Caps Take 1 capsule by mouth daily.       Contact information for follow-up providers    [name], MD. Schedule an appointment as soon as possible for a visit in 3 week(s).   Specialty: Family Medicine Contact information: [place] [address] [phone]            Contact information for after-discharge care    Destination    [facility] Preferred SNF .   Service: Skilled Nursing Contact information: [place] [phone]                  Consultations:  None   Procedures/Studies:  CT ABDOMEN PELVIS WO CONTRAST  Result Date: 08/21/2020 CLINICAL DATA:  Fall, evaluate pelvic fractures on radiograph EXAM: CT ABDOMEN AND PELVIS WITHOUT CONTRAST TECHNIQUE: Multidetector CT imaging of the abdomen and pelvis was performed following the standard protocol without IV contrast. COMPARISON:  Pelvic radiograph dated 08/21/2020 FINDINGS: Motion degraded images. Lower chest: Compressive atelectasis in the medial right lower lobe.  Hepatobiliary: 18 mm cyst in the inferior right hepatic lobe (series 2/image 21). Gallbladder is unremarkable. No intrahepatic or extrahepatic ductal dilatation. Pancreas: Within normal limits. Spleen: Within normal limits. Adrenals/Urinary Tract: Adrenal glands are within normal limits. 4.2 cm cyst in the posterior left upper kidney (series 2/image 24). No renal, ureteral, or bladder calculi. No hydronephrosis. Bladder is within normal limits. Stomach/Bowel: Stomach is notable for a large hiatal hernia/inverted intrathoracic stomach, incompletely visualized. No evidence of bowel obstruction. Status post right hemicolectomy/ileocecal resection with appendectomy with suture line in the right mid abdomen (series 2/image 34). Vascular/Lymphatic: No evidence of abdominal aortic aneurysm. Atherosclerotic calcifications of the abdominal aorta and branch vessels. No suspicious abdominopelvic lymphadenopathy. Reproductive: Status post hysterectomy.  Vaginal pessary. No adnexal masses. Other: No abdominopelvic ascites. Mild extraperitoneal hemorrhage anterior to the bladder (series 2/image 66) and in the left anterior pelvis (series 2/image 69). No free air. Musculoskeletal: Suspected nondisplaced right anterolateral 7th rib fracture (series 3/image 8), incompletely visualized and age indeterminate. Multiple bilateral pelvic fractures, all of which may be acute, including: --Anterior column of the left acetabulum (series 2/image 69) --Right superior pubic ramus (series 2/image 70) --Left parasymphyseal  region (series 2/image 74) --Left inferior pubic ramus, minimally displaced (series 2/image 79) Additionally, there are nondisplaced bilateral sacral ala fractures which are age indeterminate but likely acute given the additional findings (series 2/image 56). No widening of the sacroiliac joints or pubic symphysis. Degenerative changes of the visualized thoracolumbar spine. Moderate superior endplate compression fracture  deformity of L4, age indeterminate but likely chronic, without retropulsion. IMPRESSION: Acute nondisplaced fractures involving the anterior column of the left acetabulum, left parasymphyseal region, and right superior pubic ramus. Acute mildly displaced fracture involving the left inferior pubic ramus. Associated mild extraperitoneal hemorrhage anterior to the bladder and in the left anterior pelvis. No free air. Nondisplaced bilateral sacral ala fractures, also likely acute. Moderate compression fracture deformity at L4, age indeterminate but likely chronic. Suspected nondisplaced right anterolateral 7th rib fracture, incompletely visualized and age indeterminate. Electronically Signed   By: Julian Hy M.D.   On: 08/21/2020 10:10   CT Head Wo Contrast  Result Date: 08/21/2020 CLINICAL DATA:  87 year old female with head and neck injury from fall and mental status change. EXAM: CT HEAD WITHOUT CONTRAST CT CERVICAL SPINE WITHOUT CONTRAST TECHNIQUE: Multidetector CT imaging of the head and cervical spine was performed following the standard protocol without intravenous contrast. Multiplanar CT image reconstructions of the cervical spine were also generated. COMPARISON:  None. FINDINGS: CT HEAD FINDINGS Brain: No evidence of acute infarction, hemorrhage, hydrocephalus, extra-axial collection or mass lesion/mass effect. Atrophy, chronic small-vessel white matter ischemic changes and small remote bilateral cerebellar infarcts are noted. Vascular: Carotid and vertebral atherosclerotic calcifications are noted. Skull: Normal. Negative for fracture or focal lesion. Sinuses/Orbits: No acute finding. Other: None. CT CERVICAL SPINE FINDINGS Alignment: Normal. Skull base and vertebrae: No acute fracture. No primary bone lesion or focal pathologic process. Soft tissues and spinal canal: No prevertebral fluid or swelling. No visible canal hematoma. Disc levels: Mild to moderate degenerative disc disease/spondylosis  from C4-C7 noted. Mild to moderate multilevel facet arthropathy is identified. Upper chest: No acute abnormality Other: None IMPRESSION: 1. No evidence of acute intracranial abnormality. Atrophy, chronic small-vessel white matter ischemic changes and small remote bilateral cerebellar infarcts. 2. No static evidence of acute injury to the cervical spine. Electronically Signed   By: Margarette Canada M.D.   On: 08/21/2020 08:28   CT ANGIO CHEST PE W OR WO CONTRAST  Result Date: 08/26/2020 CLINICAL DATA:  Altered mental status, fall. EXAM: CT ANGIOGRAPHY CHEST WITH CONTRAST TECHNIQUE: Multidetector CT imaging of the chest was performed using the standard protocol during bolus administration of intravenous contrast. Multiplanar CT image reconstructions and MIPs were obtained to evaluate the vascular anatomy. CONTRAST:  29mL OMNIPAQUE IOHEXOL 350 MG/ML SOLN COMPARISON:  None. FINDINGS: Cardiovascular: There is the suggestion of a filling defect involving and upper lobe branch of the right pulmonary artery, but is uncertain if this represents true pulmonary embolus or potentially motion artifact. No other evidence of pulmonary embolus is noted. Normal cardiac size. No pericardial effusion. Atherosclerosis of thoracic aorta is noted without aneurysm or dissection. Mediastinum/Nodes: Large sliding-type hiatal hernia is noted. Thyroid gland is unremarkable. No definite adenopathy is noted. Lungs/Pleura: Small left pleural effusion is noted with adjacent subsegmental atelectasis. Right lung is clear. No pneumothorax is noted. Upper Abdomen: No acute abnormality. Musculoskeletal: No chest wall abnormality. No acute or significant osseous findings. Review of the MIP images confirms the above findings. IMPRESSION: Suggestion of filling defect seen in upper lobe branch of right pulmonary artery which may simply represent motion artifact, but the  possibility of small peripheral pulmonary embolus cannot be excluded. There is no  evidence of large central pulmonary embolus. Large sliding-type hiatal hernia. Small left pleural effusion with adjacent subsegmental atelectasis. Aortic Atherosclerosis (ICD10-I70.0). Electronically Signed   By: Marijo Conception M.D.   On: 08/26/2020 14:01   CT Cervical Spine Wo Contrast  Result Date: 08/21/2020 CLINICAL DATA:  87 year old female with head and neck injury from fall and mental status change. EXAM: CT HEAD WITHOUT CONTRAST CT CERVICAL SPINE WITHOUT CONTRAST TECHNIQUE: Multidetector CT imaging of the head and cervical spine was performed following the standard protocol without intravenous contrast. Multiplanar CT image reconstructions of the cervical spine were also generated. COMPARISON:  None. FINDINGS: CT HEAD FINDINGS Brain: No evidence of acute infarction, hemorrhage, hydrocephalus, extra-axial collection or mass lesion/mass effect. Atrophy, chronic small-vessel white matter ischemic changes and small remote bilateral cerebellar infarcts are noted. Vascular: Carotid and vertebral atherosclerotic calcifications are noted. Skull: Normal. Negative for fracture or focal lesion. Sinuses/Orbits: No acute finding. Other: None. CT CERVICAL SPINE FINDINGS Alignment: Normal. Skull base and vertebrae: No acute fracture. No primary bone lesion or focal pathologic process. Soft tissues and spinal canal: No prevertebral fluid or swelling. No visible canal hematoma. Disc levels: Mild to moderate degenerative disc disease/spondylosis from C4-C7 noted. Mild to moderate multilevel facet arthropathy is identified. Upper chest: No acute abnormality Other: None IMPRESSION: 1. No evidence of acute intracranial abnormality. Atrophy, chronic small-vessel white matter ischemic changes and small remote bilateral cerebellar infarcts. 2. No static evidence of acute injury to the cervical spine. Electronically Signed   By: Margarette Canada M.D.   On: 08/21/2020 08:28   DG Pelvis Portable  Result Date: 08/21/2020 CLINICAL  DATA:  Fall, evaluate for rib fracture EXAM: PORTABLE PELVIS 1-2 VIEWS COMPARISON:  None. FINDINGS: Bilateral hips are intact. Bilateral hip joint spaces are preserved. Suspected nondisplaced right pelvic ring fractures, possibly acute. Suspected nondisplaced left superior pubic ramus/parasymphyseal fracture, possibly subacute/chronic. Old left inferior pubic ramus fracture is suspected. Degenerative changes of the lower lumbar spine. IMPRESSION: Bilateral hips are intact. Suspected nondisplaced right pelvic ring fractures, possibly acute. Suspected left pelvic ring fractures, possibly subacute/chronic. Electronically Signed   By: Julian Hy M.D.   On: 08/21/2020 08:58   US Venous Img Lower Bilateral (DVT)  Result Date: 08/26/2020 CLINICAL DATA:  Bilateral lower extremity edema and pain. Possible right upper lobe pulmonary embolism by CTA today. History of prior DVT in the right popliteal vein and right tibial veins. EXAM: BILATERAL LOWER EXTREMITY VENOUS DOPPLER ULTRASOUND TECHNIQUE: Gray-scale sonography with graded compression, as well as color Doppler and duplex ultrasound were performed to evaluate the lower extremity deep venous systems from the level of the common femoral vein and including the common femoral, femoral, profunda femoral, popliteal and calf veins including the posterior tibial, peroneal and gastrocnemius veins when visible. The superficial great saphenous vein was also interrogated. Spectral Doppler was utilized to evaluate flow at rest and with distal augmentation maneuvers in the common femoral, femoral and popliteal veins. COMPARISON:  Right lower extremity study on 07/18/2019 FINDINGS: RIGHT LOWER EXTREMITY Common Femoral Vein: Circumferential nonocclusive mural thrombus is identified in the right common femoral vein which is echogenic in appearance. This suggests some level of chronicity but was not present on the 07/18/2019 study. Saphenofemoral Junction: The common femoral  vein thrombus mentioned above extends up to the saphenofemoral junction but the saphenofemoral junction is open. Profunda Femoral Vein: No evidence of thrombus. Normal compressibility and flow on color Doppler  imaging. Femoral Vein: No evidence of thrombus. Normal compressibility, respiratory phasicity and response to augmentation. Popliteal Vein: No evidence of thrombus. Normal compressibility, respiratory phasicity and response to augmentation. Calf Veins: No evidence of thrombus. Normal compressibility and flow on color Doppler imaging. Superficial Great Saphenous Vein: No evidence of thrombus. Normal compressibility. Venous Reflux:  None. Other Findings: Stable size of right popliteal fossa Baker's cyst measuring up to 4.4 cm in maximal length. LEFT LOWER EXTREMITY Common Femoral Vein: No evidence of thrombus. Normal compressibility, respiratory phasicity and response to augmentation. Saphenofemoral Junction: No evidence of thrombus. Normal compressibility and flow on color Doppler imaging. Profunda Femoral Vein: No evidence of thrombus. Normal compressibility and flow on color Doppler imaging. Femoral Vein: No evidence of thrombus. Normal compressibility, respiratory phasicity and response to augmentation. Popliteal Vein: No evidence of thrombus. Normal compressibility, respiratory phasicity and response to augmentation. Calf Veins: No evidence of thrombus. Normal compressibility and flow on color Doppler imaging. Superficial Great Saphenous Vein: No evidence of thrombus. Normal compressibility. Venous Reflux:  None. Other Findings: No evidence of superficial thrombophlebitis or abnormal fluid collection. IMPRESSION: 1. Circumferential nonocclusive echogenic mural thrombus in the right common femoral vein. This thrombus is echogenic suggesting some degree of chronicity, but was not present on the February, 2021 study. 2. No evidence of left lower extremity deep vein thrombosis. 3. Stable right popliteal fossa  Baker's cyst. Electronically Signed   By: Aletta Edouard M.D.   On: 08/26/2020 17:04   DG Chest Port 1 View  Result Date: 08/27/2020 CLINICAL DATA:  87 year old female with dyspnea and sepsis. EXAM: PORTABLE CHEST - 1 VIEW COMPARISON:  08/25/2020 FINDINGS: Unchanged moderate cardiomegaly. Similar appearing large hiatal hernia. Slightly improved aeration with persistent trace scattered patchy pulmonary opacities. Mild bibasilar subsegmental atelectasis. No significant pleural effusion or evidence of pneumothorax. Atherosclerotic calcification of the aortic arch. No acute osseous abnormality. IMPRESSION: 1. Improving mild pulmonary edema. 2. No focal consolidations that would suggest pneumonia. 3. Similar appearing large hiatal hernia. 4.  Aortic Atherosclerosis (ICD10-I70.0). Electronically Signed   By: Ruthann Cancer MD   On: 08/27/2020 09:31   DG Chest Port 1 View  Result Date: 08/25/2020 CLINICAL DATA:  Shortness of breath EXAM: PORTABLE CHEST 1 VIEW COMPARISON:  08/21/2020 FINDINGS: Stable cardiomegaly with slightly lower lung volumes and minimal increased diffuse interstitial opacities suggesting component of mild interstitial edema. Right hemidiaphragm is elevated as before with blunting of the right costophrenic angle, favored to represent pleuroparenchymal scarring. Faint increased right midlung perihilar opacity, difficult to exclude mild right mid lung pneumonia. No left effusion. Negative for pneumothorax. Aorta atherosclerotic and tortuous. Large hiatal hernia again noted projecting over the cardiac silhouette. Degenerative changes of the spine. Bones are osteopenic. IMPRESSION: Cardiomegaly with mild interstitial edema pattern. Chronic right basilar pleuroparenchymal scarring Mild right midlung perihilar asymmetric opacity, difficult to exclude superimposed pneumonia Very large hiatal hernia, unchanged Aortic Atherosclerosis (ICD10-I70.0). Electronically Signed   By: Jerilynn Mages.  Shick M.D.   On:  08/25/2020 08:22   DG Chest Port 1 View  Result Date: 08/21/2020 CLINICAL DATA:  Found down EXAM: PORTABLE CHEST 1 VIEW COMPARISON:  08/21/2016 FINDINGS: Increased interstitial markings. No focal consolidation. Possible small right pleural effusion. No pneumothorax. Cardiomegaly. Large hiatal hernia. IMPRESSION: Cardiomegaly. Possible small right pleural effusion. Large hiatal hernia. Electronically Signed   By: Julian Hy M.D.   On: 08/21/2020 08:56   ECHOCARDIOGRAM COMPLETE  Result Date: 08/26/2020    ECHOCARDIOGRAM REPORT   Patient Name:   SERAPHINA B Shen  Date of Exam: 08/26/2020 Medical Rec #:  [id number]      Height:       63.0 in Accession #:    [id number]     Weight:       166.8 lb Date of Birth:  [birth year]      BSA:          1.790 m Patient Age:    23 years       BP:           138/55 mmHg Patient Gender: F              HR:           74 bpm. Exam Location:  [facility] Procedure: 2D Echo, Cardiac Doppler and Color Doppler Indications:     Abnormal ECG R94.31  History:         Patient has prior history of Echocardiogram examinations, most                  recent 08/24/2016. Risk Factors:Hypertension.  Sonographer:     Sherrie Sport RDCS (AE) Referring Phys:  [place] Diagnosing Phys: Ida Rogue MD  Sonographer Comments: Suboptimal apical window. Image acquisition challenging due to mastectomy. IMPRESSIONS  1. Left ventricular ejection fraction, by estimation, is 60 to 65%. The left ventricle has normal function. The left ventricle has no regional wall motion abnormalities. There is moderate left ventricular hypertrophy. Left ventricular diastolic parameters are indeterminate.  2. Right ventricular systolic function is normal. The right ventricular size is normal. There is moderately elevated pulmonary artery systolic pressure. The estimated right ventricular systolic pressure is 36.6 mmHg. FINDINGS  Left Ventricle: Left ventricular ejection fraction, by estimation, is 60 to 65%. The left  ventricle has normal function. The left ventricle has no regional wall motion abnormalities. The left ventricular internal cavity size was normal in size. There is  moderate left ventricular hypertrophy. Left ventricular diastolic parameters are indeterminate. Right Ventricle: The right ventricular size is normal. No increase in right ventricular wall thickness. Right ventricular systolic function is normal. There is moderately elevated pulmonary artery systolic pressure. The tricuspid regurgitant velocity is 3.26 m/s, and with an assumed right atrial pressure of 5 mmHg, the estimated right ventricular systolic pressure is 44.0 mmHg. Left Atrium: Left atrial size was normal in size. Right Atrium: Right atrial size was normal in size. Pericardium: There is no evidence of pericardial effusion. Mitral Valve: The mitral valve is normal in structure. No evidence of mitral valve regurgitation. No evidence of mitral valve stenosis. Tricuspid Valve: The tricuspid valve is normal in structure. Tricuspid valve regurgitation is mild . No evidence of tricuspid stenosis. Aortic Valve: The aortic valve is normal in structure. Aortic valve regurgitation is not visualized. Mild aortic valve sclerosis is present, with no evidence of aortic valve stenosis. Aortic valve mean gradient measures 4.0 mmHg. Aortic valve peak gradient measures 7.7 mmHg. Aortic valve area, by VTI measures 3.26 cm. Pulmonic Valve: The pulmonic valve was normal in structure. Pulmonic valve regurgitation is not visualized. No evidence of pulmonic stenosis. Aorta: The aortic root is normal in size and structure. Venous: The inferior vena cava is normal in size with greater than 50% respiratory variability, suggesting right atrial pressure of 3 mmHg. IAS/Shunts: No atrial level shunt detected by color flow Doppler.  LEFT VENTRICLE PLAX 2D LVIDd:         2.93 cm  Diastology LVIDs:         1.69 cm  LV e' medial:    3.92 cm/s LV PW:         1.51 cm  LV E/e' medial:   13.8 LV IVS:        1.42 cm  LV e' lateral:   4.68 cm/s LVOT diam:     2.00 cm  LV E/e' lateral: 11.5 LV SV:         62 LV SV Index:   35 LVOT Area:     3.14 cm  LEFT ATRIUM         Index LA diam:    1.80 cm 1.01 cm/m  AORTIC VALVE                   PULMONIC VALVE AV Area (Vmax):    3.01 cm    PV Vmax:        1.17 m/s AV Area (Vmean):   3.10 cm    PV Peak grad:   5.5 mmHg AV Area (VTI):     3.26 cm    RVOT Peak grad: 8 mmHg AV Vmax:           139.00 cm/s AV Vmean:          95.700 cm/s AV VTI:            0.191 m AV Peak Grad:      7.7 mmHg AV Mean Grad:      4.0 mmHg LVOT Vmax:         133.00 cm/s LVOT Vmean:        94.400 cm/s LVOT VTI:          0.198 m LVOT/AV VTI ratio: 1.04  AORTA Ao Root diam: 3.13 cm MITRAL VALVE               TRICUSPID VALVE MV Area (PHT): 3.07 cm    TR Peak grad:   42.5 mmHg MV Decel Time: 247 msec    TR Vmax:        326.00 cm/s MV E velocity: 54.00 cm/s MV A velocity: 98.10 cm/s  SHUNTS MV E/A ratio:  0.55        Systemic VTI:  0.20 m                            Systemic Diam: 2.00 cm Ida Rogue MD Electronically signed by Ida Rogue MD Signature Date/Time: 08/26/2020/3:31:11 PM    Final       Subjective: - no chest pain, shortness of breath, no abdominal pain, nausea or vomiting.   Discharge Exam: BP 124/64   Pulse 84   Temp 98.1 F (36.7 C)   Resp 20   Ht 5\' 3"  (1.6 m)   Wt 75.7 kg   SpO2 99%   BMI 29.55 kg/m   General: Pt is alert, awake, not in acute distress Cardiovascular: RRR, S1/S2 +, no rubs, no gallops Respiratory: CTA bilaterally, no wheezing, no rhonchi Abdominal: Soft, NT, ND, bowel sounds + Extremities: no edema, no cyanosis  The results of significant diagnostics from this hospitalization (including imaging, microbiology, ancillary and laboratory) are listed below for reference.     Microbiology: Recent Results (from the past 240 hour(s))  Resp Panel by RT-PCR (Flu A&B, Covid) Nasopharyngeal Swab     Status: None   Collection Time:  08/21/20  7:38 AM   Specimen: Nasopharyngeal Swab; Nasopharyngeal(NP) swabs in vial transport medium  Result Value Ref Range Status   SARS Coronavirus  2 by RT PCR NEGATIVE NEGATIVE Final    Comment: (NOTE) SARS-CoV-2 target nucleic acids are NOT DETECTED.  The SARS-CoV-2 RNA is generally detectable in upper respiratory specimens during the acute phase of infection. The lowest concentration of SARS-CoV-2 viral copies this assay can detect is 138 copies/mL. A negative result does not preclude SARS-Cov-2 infection and should not be used as the sole basis for treatment or other patient management decisions. A negative result may occur with  improper specimen collection/handling, submission of specimen other than nasopharyngeal swab, presence of viral mutation(s) within the areas targeted by this assay, and inadequate number of viral copies(<138 copies/mL). A negative result must be combined with clinical observations, patient history, and epidemiological information. The expected result is Negative.  Fact Sheet for Patients:  EntrepreneurPulse.com.au  Fact Sheet for Healthcare Providers:  IncredibleEmployment.be  This test is no t yet approved or cleared by the [place] FDA and  has been authorized for detection and/or diagnosis of SARS-CoV-2 by FDA under an Emergency Use Authorization (EUA). This EUA will remain  in effect (meaning this test can be used) for the duration of the COVID-19 declaration under Section 564(b)(1) of the Act, 21 U.S.C.section 360bbb-3(b)(1), unless the authorization is terminated  or revoked sooner.       Influenza A by PCR NEGATIVE NEGATIVE Final   Influenza B by PCR NEGATIVE NEGATIVE Final    Comment: (NOTE) The Xpert Xpress SARS-CoV-2/FLU/RSV plus assay is intended as an aid in the diagnosis of influenza from Nasopharyngeal swab specimens and should not be used as a sole basis for treatment. Nasal washings  and aspirates are unacceptable for Xpert Xpress SARS-CoV-2/FLU/RSV testing.  Fact Sheet for Patients: EntrepreneurPulse.com.au  Fact Sheet for Healthcare Providers: IncredibleEmployment.be  This test is not yet approved or cleared by the [place] FDA and has been authorized for detection and/or diagnosis of SARS-CoV-2 by FDA under an Emergency Use Authorization (EUA). This EUA will remain in effect (meaning this test can be used) for the duration of the COVID-19 declaration under Section 564(b)(1) of the Act, 21 U.S.C. section 360bbb-3(b)(1), unless the authorization is terminated or revoked.  Performed at [facility], [address] [place], [place] [address]   Blood Culture (routine x 2)     Status: None   Collection Time: 08/21/20  7:38 AM   Specimen: BLOOD  Result Value Ref Range Status   Specimen Description BLOOD RIGHT ASSIST CONTROL  Final   Special Requests   Final    BOTTLES DRAWN AEROBIC AND ANAEROBIC Blood Culture results may not be optimal due to an excessive volume of blood received in culture bottles   Culture   Final    NO GROWTH 5 DAYS Performed at [facility], [address] [place], [place] [address]    Report Status 08/26/2020 FINAL  Final  Blood Culture (routine x 2)     Status: None   Collection Time: 08/21/20  7:38 AM   Specimen: BLOOD  Result Value Ref Range Status   Specimen Description BLOOD LEFT ASSIST CONTROL  Final   Special Requests   Final    BOTTLES DRAWN AEROBIC AND ANAEROBIC Blood Culture results may not be optimal due to an excessive volume of blood received in culture bottles   Culture   Final    NO GROWTH 5 DAYS Performed at [facility], [address], [place],  [address]    Report Status 08/26/2020 FINAL  Final  Urine culture  Status: Abnormal   Collection Time: 08/21/20 10:13 AM   Specimen: Urine, Random  Result Value Ref Range Status   Specimen  Description   Final    URINE, RANDOM Performed at [facility], [address] [place], [place] [address]    Special Requests   Final    NONE Performed at [facility], [place], [place] [address]    Culture 40,000 COLONIES/mL KLEBSIELLA PNEUMONIAE (A)  Final   Report Status 08/23/2020 FINAL  Final   Organism ID, Bacteria KLEBSIELLA PNEUMONIAE (A)  Final      Susceptibility   Klebsiella pneumoniae - MIC*    AMPICILLIN >=32 RESISTANT Resistant     CEFAZOLIN <=4 SENSITIVE Sensitive     CEFEPIME <=0.12 SENSITIVE Sensitive     CEFTRIAXONE 0.5 SENSITIVE Sensitive     CIPROFLOXACIN <=0.25 SENSITIVE Sensitive     GENTAMICIN <=1 SENSITIVE Sensitive     IMIPENEM <=0.25 SENSITIVE Sensitive     NITROFURANTOIN 32 SENSITIVE Sensitive     TRIMETH/SULFA <=20 SENSITIVE Sensitive     AMPICILLIN/SULBACTAM 4 SENSITIVE Sensitive     PIP/TAZO <=4 SENSITIVE Sensitive     * 40,000 COLONIES/mL KLEBSIELLA PNEUMONIAE  Resp Panel by RT-PCR (Flu A&B, Covid) Nasopharyngeal Swab     Status: None   Collection Time: 08/30/20 10:38 AM   Specimen: Nasopharyngeal Swab; Nasopharyngeal(NP) swabs in vial transport medium  Result Value Ref Range Status   SARS Coronavirus 2 by RT PCR NEGATIVE NEGATIVE Final    Comment: (NOTE) SARS-CoV-2 target nucleic acids are NOT DETECTED.  The SARS-CoV-2 RNA is generally detectable in upper respiratory specimens during the acute phase of infection. The lowest concentration of SARS-CoV-2 viral copies this assay can detect is 138 copies/mL. A negative result does not preclude SARS-Cov-2 infection and should not be used as the sole basis for treatment or other patient management decisions. A negative result may occur with  improper specimen collection/handling, submission of specimen other than nasopharyngeal swab, presence of viral mutation(s) within the areas targeted by this assay, and inadequate number of viral copies(<138 copies/mL). A  negative result must be combined with clinical observations, patient history, and epidemiological information. The expected result is Negative.  Fact Sheet for Patients:  EntrepreneurPulse.com.au  Fact Sheet for Healthcare Providers:  IncredibleEmployment.be  This test is no t yet approved or cleared by the [place] FDA and  has been authorized for detection and/or diagnosis of SARS-CoV-2 by FDA under an Emergency Use Authorization (EUA). This EUA will remain  in effect (meaning this test can be used) for the duration of the COVID-19 declaration under Section 564(b)(1) of the Act, 21 U.S.C.section 360bbb-3(b)(1), unless the authorization is terminated  or revoked sooner.       Influenza A by PCR NEGATIVE NEGATIVE Final   Influenza B by PCR NEGATIVE NEGATIVE Final    Comment: (NOTE) The Xpert Xpress SARS-CoV-2/FLU/RSV plus assay is intended as an aid in the diagnosis of influenza from Nasopharyngeal swab specimens and should not be used as a sole basis for treatment. Nasal washings and aspirates are unacceptable for Xpert Xpress SARS-CoV-2/FLU/RSV testing.  Fact Sheet for Patients: EntrepreneurPulse.com.au  Fact Sheet for Healthcare Providers: IncredibleEmployment.be  This test is not yet approved or cleared by the [place] FDA and has been authorized for detection and/or diagnosis of SARS-CoV-2 by FDA under an Emergency Use Authorization (EUA). This EUA will remain in effect (meaning this test can be used) for the duration of the COVID-19 declaration under  Section 564(b)(1) of the Act, 21 U.S.C. section 360bbb-3(b)(1), unless the authorization is terminated or revoked.  Performed at [facility], [address] [place], [place] [address]      Labs: Basic Metabolic Panel: Recent Labs  Lab 08/24/20 [phone] 08/25/20 1751 08/26/20 0454 08/27/20 0454 08/28/20 0720 08/29/20 0634  08/30/20 0608  NA 136   < > 137 139 134* 138 135  K 3.5   < > 3.4* 4.2 3.9 4.4 4.2  CL 104   < > 100 103 98 101 100  CO2 23   < > 26 24 26 28 27   GLUCOSE 155*   < > 152* 131* 126* 133* 138*  BUN 19   < > 23 26* 28* 30* 26*  CREATININE 0.71   < > 0.69 0.74 0.71 0.68 0.69  CALCIUM 8.7*   < > 8.5* 8.7* 8.7* 8.6* 8.6*  MG 2.0  --   --   --   --   --   --    < > = values in this interval not displayed.   Liver Function Tests: Recent Labs  Lab 08/25/20 0811 08/27/20 0454  AST 18 18  ALT 16 18  ALKPHOS 51 56  BILITOT 1.8* 1.6*  PROT 6.2* 6.4*  ALBUMIN 3.0* 2.9*   CBC: Recent Labs  Lab 08/24/20 0623 08/25/20 0811 08/26/20 0454 08/27/20 0454 08/28/20 0720 08/30/20 0608  WBC 13.0* 11.7* 12.4* 12.1* 11.8* 12.5*  NEUTROABS 10.3*  --   --   --   --   --   HGB 12.1 12.3 12.0 12.2 12.2 11.5*  HCT 36.5 34.6* 35.7* 37.1 37.9 35.7*  MCV 93.1 91.1 92.2 94.6 95.5 95.2  PLT 226 272 292 301 383 379   CBG: Recent Labs  Lab 08/29/20 0755 08/29/20 1140 08/29/20 1615 08/29/20 2220 08/30/20 0750  GLUCAP 123* 193* 136* 132* 141*   Hgb A1c No results for input(s): HGBA1C in the last 72 hours. Lipid Profile No results for input(s): CHOL, HDL, LDLCALC, TRIG, CHOLHDL, LDLDIRECT in the last 72 hours. Thyroid function studies No results for input(s): TSH, T4TOTAL, T3FREE, THYROIDAB in the last 72 hours.  Invalid input(s): FREET3 Urinalysis    Component Value Date/Time   COLORURINE YELLOW (A) 08/21/2020 1013   APPEARANCEUR HAZY (A) 08/21/2020 1013   APPEARANCEUR Cloudy (A) 07/19/2015 1055   LABSPEC 1.015 08/21/2020 1013   LABSPEC 1.008 01/06/2012 1922   PHURINE 5.0 08/21/2020 1013   GLUCOSEU 50 (A) 08/21/2020 1013   GLUCOSEU 150 mg/dL 01/06/2012 1922   HGBUR NEGATIVE 08/21/2020 1013   BILIRUBINUR NEGATIVE 08/21/2020 1013   BILIRUBINUR neg 08/04/2020 1446   BILIRUBINUR Negative 07/19/2015 1055   BILIRUBINUR Negative 01/06/2012 1922   KETONESUR 20 (A) 08/21/2020 1013    PROTEINUR 100 (A) 08/21/2020 1013   UROBILINOGEN 0.2 08/04/2020 1446   NITRITE NEGATIVE 08/21/2020 1013   LEUKOCYTESUR TRACE (A) 08/21/2020 1013   LEUKOCYTESUR Trace 01/06/2012 1922    FURTHER DISCHARGE INSTRUCTIONS:   Get Medicines reviewed and adjusted: Please take all your medications with you for your next visit with your Primary MD   Laboratory/radiological data: Please request your Primary MD to go over all hospital tests and procedure/radiological results at the follow up, please ask your Primary MD to get all Hospital records sent to his/her office.   In some cases, they will be blood work, cultures and biopsy results pending at the time of your discharge. Please request that your primary care M.D. goes through all the  records of your hospital data and follows up on these results.   Also Note the following: If you experience worsening of your admission symptoms, develop shortness of breath, life threatening emergency, suicidal or homicidal thoughts you must seek medical attention immediately by calling 911 or calling your MD immediately  if symptoms less severe.   You must read complete instructions/literature along with all the possible adverse reactions/side effects for all the Medicines you take and that have been prescribed to you. Take any new Medicines after you have completely understood and accpet all the possible adverse reactions/side effects.    Do not drive when taking Pain medications or sleeping medications (Benzodaizepines)   Do not take more than prescribed Pain, Sleep and Anxiety Medications. It is not advisable to combine anxiety,sleep and pain medications without talking with your primary care practitioner   Special Instructions: If you have smoked or chewed Tobacco  in the last 2 yrs please stop smoking, stop any regular Alcohol  and or any Recreational drug use.   Wear Seat belts while driving.   Please note: You were cared for by a hospitalist during your  hospital stay. Once you are discharged, your primary care physician will handle any further medical issues. Please note that NO REFILLS for any discharge medications will be authorized once you are discharged, as it is imperative that you return to your primary care physician (or establish a relationship with a primary care physician if you do not have one) for your post hospital discharge needs so that they can reassess your need for medications and monitor your lab values.  Time coordinating discharge: 45 minutes  SIGNED:  Marzetta Board, MD, PhD 08/30/2020, 11:46 AM

## 2020-08-30 NOTE — Plan of Care (Signed)
  Problem: Education: Goal: Knowledge of General Education information will improve Description: Including pain rating scale, medication(s)/side effects and non-pharmacologic comfort measures 08/30/2020 1345 by Vivien Rota, RN Outcome: Adequate for Discharge 08/30/2020 1205 by Vivien Rota, RN Outcome: Progressing   Problem: Health Behavior/Discharge Planning: Goal: Ability to manage health-related needs will improve 08/30/2020 1345 by Vivien Rota, RN Outcome: Adequate for Discharge 08/30/2020 1205 by Vivien Rota, RN Outcome: Progressing   Problem: Clinical Measurements: Goal: Ability to maintain clinical measurements within normal limits will improve 08/30/2020 1345 by Vivien Rota, RN Outcome: Adequate for Discharge 08/30/2020 1205 by Vivien Rota, RN Outcome: Progressing Goal: Will remain free from infection 08/30/2020 1345 by Vivien Rota, RN Outcome: Adequate for Discharge 08/30/2020 1205 by Vivien Rota, RN Outcome: Progressing Goal: Diagnostic test results will improve 08/30/2020 1345 by Vivien Rota, RN Outcome: Adequate for Discharge 08/30/2020 1205 by Vivien Rota, RN Outcome: Progressing Goal: Respiratory complications will improve 08/30/2020 1345 by Vivien Rota, RN Outcome: Adequate for Discharge 08/30/2020 1205 by Vivien Rota, RN Outcome: Progressing Goal: Cardiovascular complication will be avoided 08/30/2020 1345 by Vivien Rota, RN Outcome: Adequate for Discharge 08/30/2020 1205 by Vivien Rota, RN Outcome: Progressing   Problem: Activity: Goal: Risk for activity intolerance will decrease 08/30/2020 1345 by Vivien Rota, RN Outcome: Adequate for Discharge 08/30/2020 1205 by Vivien Rota, RN Outcome: Progressing   Problem: Nutrition: Goal: Adequate nutrition will be maintained 08/30/2020 1345 by Vivien Rota, RN Outcome: Adequate for  Discharge 08/30/2020 1205 by Vivien Rota, RN Outcome: Progressing   Problem: Coping: Goal: Level of anxiety will decrease 08/30/2020 1345 by Vivien Rota, RN Outcome: Adequate for Discharge 08/30/2020 1205 by Vivien Rota, RN Outcome: Progressing   Problem: Elimination: Goal: Will not experience complications related to bowel motility 08/30/2020 1345 by Vivien Rota, RN Outcome: Adequate for Discharge 08/30/2020 1205 by Vivien Rota, RN Outcome: Progressing Goal: Will not experience complications related to urinary retention 08/30/2020 1345 by Vivien Rota, RN Outcome: Adequate for Discharge 08/30/2020 1205 by Vivien Rota, RN Outcome: Progressing   Problem: Pain Managment: Goal: General experience of comfort will improve 08/30/2020 1345 by Vivien Rota, RN Outcome: Adequate for Discharge 08/30/2020 1205 by Vivien Rota, RN Outcome: Progressing   Problem: Safety: Goal: Ability to remain free from injury will improve 08/30/2020 1345 by Vivien Rota, RN Outcome: Adequate for Discharge 08/30/2020 1205 by Vivien Rota, RN Outcome: Progressing   Problem: Skin Integrity: Goal: Risk for impaired skin integrity will decrease 08/30/2020 1345 by Vivien Rota, RN Outcome: Adequate for Discharge 08/30/2020 1205 by Vivien Rota, RN Outcome: Progressing   Problem: Fluid Volume: Goal: Hemodynamic stability will improve 08/30/2020 1345 by Vivien Rota, RN Outcome: Adequate for Discharge 08/30/2020 1205 by Vivien Rota, RN Outcome: Progressing   Problem: Clinical Measurements: Goal: Diagnostic test results will improve 08/30/2020 1345 by Vivien Rota, RN Outcome: Adequate for Discharge 08/30/2020 1205 by Vivien Rota, RN Outcome: Progressing Goal: Signs and symptoms of infection will decrease 08/30/2020 1345 by Vivien Rota, RN Outcome: Adequate for  Discharge 08/30/2020 1205 by Vivien Rota, RN Outcome: Progressing   Problem: Respiratory: Goal: Ability to maintain adequate ventilation will improve 08/30/2020 1345 by Vivien Rota, RN Outcome: Adequate for Discharge 08/30/2020 1205 by Vivien Rota, RN Outcome: Progressing

## 2020-08-31 ENCOUNTER — Emergency Department: Payer: Medicare Other

## 2020-08-31 ENCOUNTER — Other Ambulatory Visit: Payer: Self-pay

## 2020-08-31 ENCOUNTER — Emergency Department
Admission: EM | Admit: 2020-08-31 | Discharge: 2020-08-31 | Disposition: A | Discharge status: Home / Self Care | Payer: Medicare Other | Attending: Emergency Medicine | Admitting: Emergency Medicine

## 2020-08-31 DIAGNOSIS — R0602 Shortness of breath: Secondary | ICD-10-CM | POA: Diagnosis present

## 2020-08-31 DIAGNOSIS — Z85828 Personal history of other malignant neoplasm of skin: Secondary | ICD-10-CM | POA: Diagnosis not present

## 2020-08-31 DIAGNOSIS — Z7901 Long term (current) use of anticoagulants: Secondary | ICD-10-CM | POA: Diagnosis not present

## 2020-08-31 DIAGNOSIS — I110 Hypertensive heart disease with heart failure: Secondary | ICD-10-CM | POA: Diagnosis not present

## 2020-08-31 DIAGNOSIS — Z85038 Personal history of other malignant neoplasm of large intestine: Secondary | ICD-10-CM | POA: Insufficient documentation

## 2020-08-31 DIAGNOSIS — Z79899 Other long term (current) drug therapy: Secondary | ICD-10-CM | POA: Insufficient documentation

## 2020-08-31 DIAGNOSIS — Z853 Personal history of malignant neoplasm of breast: Secondary | ICD-10-CM | POA: Insufficient documentation

## 2020-08-31 DIAGNOSIS — F0390 Unspecified dementia without behavioral disturbance: Secondary | ICD-10-CM | POA: Insufficient documentation

## 2020-08-31 DIAGNOSIS — I5033 Acute on chronic diastolic (congestive) heart failure: Secondary | ICD-10-CM | POA: Diagnosis not present

## 2020-08-31 LAB — BASIC METABOLIC PANEL
Anion gap: 9 (ref 5–15)
BUN: 25 mg/dL — ABNORMAL HIGH (ref 8–23)
CO2: 28 mmol/L (ref 22–32)
Calcium: 8.6 mg/dL — ABNORMAL LOW (ref 8.9–10.3)
Chloride: 97 mmol/L — ABNORMAL LOW (ref 98–111)
Creatinine, Ser: 0.67 mg/dL (ref 0.44–1.00)
GFR, Estimated: >60 mL/min (ref >60)
Glucose, Bld: 147 mg/dL — ABNORMAL HIGH (ref 70–99)
Potassium: 4.4 mmol/L (ref 3.5–5.1)
Sodium: 134 mmol/L — ABNORMAL LOW (ref 135–145)

## 2020-08-31 LAB — CBC WITH DIFFERENTIAL/PLATELET
Abs Immature Granulocytes: 0.13 10*3/uL — ABNORMAL HIGH (ref 0.00–0.07)
Basophils Absolute: 0.1 10*3/uL (ref 0.0–0.1)
Basophils Relative: 0 %
Eosinophils Absolute: 0.2 10*3/uL (ref 0.0–0.5)
Eosinophils Relative: 1 %
HCT: 36.7 % (ref 36.0–46.0)
Hemoglobin: 12 g/dL (ref 12.0–15.0)
Immature Granulocytes: 1 %
Lymphocytes Relative: 9 %
Lymphs Abs: 1.4 10*3/uL (ref 0.7–4.0)
MCH: 31.1 pg (ref 26.0–34.0)
MCHC: 32.7 g/dL (ref 30.0–36.0)
MCV: 95.1 fL (ref 80.0–100.0)
Monocytes Absolute: 1.2 10*3/uL — ABNORMAL HIGH (ref 0.1–1.0)
Monocytes Relative: 7 %
Neutro Abs: 12.9 10*3/uL — ABNORMAL HIGH (ref 1.7–7.7)
Neutrophils Relative %: 82 %
Platelets: 450 10*3/uL — ABNORMAL HIGH (ref 150–400)
RBC: 3.86 MIL/uL — ABNORMAL LOW (ref 3.87–5.11)
RDW: 13.7 % (ref 11.5–15.5)
WBC: 15.9 10*3/uL — ABNORMAL HIGH (ref 4.0–10.5)
nRBC: 0 % (ref 0.0–0.2)

## 2020-08-31 LAB — BRAIN NATRIURETIC PEPTIDE: B Natriuretic Peptide: 82.1 pg/mL (ref 0.0–100.0)

## 2020-08-31 LAB — TROPONIN I (HIGH SENSITIVITY): Troponin I (High Sensitivity): 9 ng/L (ref <18)

## 2020-08-31 MED ORDER — FUROSEMIDE 10 MG/ML IJ SOLN
40.0000 mg | Freq: Once | INTRAMUSCULAR | Status: AC
  Administered 2020-08-31: 40 mg via INTRAVENOUS
  Filled 2020-08-31: qty 4

## 2020-08-31 NOTE — ED Notes (Signed)
[facility]  CALLED  FOR  TRANSPORT  TO  [facility] 

## 2020-08-31 NOTE — Discharge Instructions (Signed)
We provided [facility] with a single dose of IV Lasix to help with a small amount of fluid buildup, but she looks well otherwise.  Please continue her medication regimen as prescribed.  Return to the ED with any further worsening symptoms.

## 2020-08-31 NOTE — ED Provider Notes (Signed)
[facility] Emergency Department Provider Note   ____________________________________________   Event Date/Time   First MD Initiated Contact with Patient 08/31/20 1220     (approximate)  I have reviewed the triage vital signs and the nursing notes.   HISTORY  Chief Complaint Shortness of Breath    HPI [name] is a 87 y.o. female with past medical history of dementia, hypertension, diastolic CHF, PE on Eliquis, GERD, and breast cancer who presents to the ED for shortness of breath.  History is limited due to patient's baseline dementia.  Per EMS, patient was discharged from the hospital yesterday and staff at her rehab facility were concerned that she was having trouble breathing.  Patient currently denies any difficulty breathing and states she is not sure why she is here.  She denies any fevers, cough, or pain in her chest.  She was recently admitted to the hospital for sepsis secondary to UTI, fall with pelvic fracture, diastolic CHF exacerbation, and new diagnosis of PE.  Son at bedside states patient looks better today than she has since she was admitted to the hospital.  She has been wearing 2 L nasal cannula following discharge and O2 sats within normal limits per EMS.        Past Medical History:  Diagnosis Date  . Allergy   . Arthritis    back  . Benign neoplasm of skin, site unspecified   . Bowel trouble   . Breast cancer ([place])   . Cancer ([place])    colon 20 years ago  . Cancer of breast [facility]) April 10,.2013   Left breast, 3.8 cm, 4 cm axillary node, T2, N1a, ER negative PR negative, HER-2/neu not amplified.  . Cataract   . Colon cancer ([place])   . Hard of hearing   . Heartburn   . Hiatal hernia   . History of stomach ulcers   . History of stomach ulcers   . Hypertension 2012  . Personal history of malignant neoplasm of breast 2013   LEFT MASTECTOMY,left modified radical mastectomy on September 06, 2011 483.8 cm primary tumor with a 4.6  cm axillary metastasis. 3 of 13 nodes were positive. T2,N1a lesion. This was a triple negative lesion  . Shingles Dec 2015  . Vaginal atrophy 08/10/2015  . Wrist fracture, right     Patient Active Problem List   Diagnosis Date Noted  . Severe sepsis with septic shock ([place]) 08/21/2020  . Depression 08/21/2020  . Acute metabolic encephalopathy 26/94/8546  . Fall at home, initial encounter 08/21/2020  . Pelvic ring fracture ([place]) 08/21/2020  . Hypokalemia 08/21/2020  . Hyperglycemia 08/21/2020  . Functional diarrhea 07/29/2019  . Open wound of left knee, leg, and ankle with complication 27/07/5007  . Chest wall pain 10/08/2017  . History of recurrent urinary tract infection 02/03/2017  . Vaginal pessary in situ 02/03/2017  . Iron deficiency anemia due to chronic blood loss 12/01/2016  . Personal history of malignant neoplasm of breast 11/09/2016  . GI bleed 08/21/2016  . Hereditary and idiopathic peripheral neuropathy 09/09/2015  . Malignant neoplasm of left female breast ([place]) 09/09/2015  . Prolapse of vaginal wall with midline cystocele 08/10/2015  . Incomplete bladder emptying 08/10/2015  . Midline low back pain without sciatica 08/10/2015  . Fecal incontinence 08/10/2015  . Recurrent UTI 07/19/2015  . Atrophic vaginitis 07/19/2015  . Adenomatous colon polyp 02/24/2015  . Anemia 02/24/2015  . Chronic sinusitis 02/24/2015  . Diverticulosis 02/24/2015  . Esophagitis  02/24/2015  . GERD (gastroesophageal reflux disease) 02/24/2015  . Hiatal hernia 02/24/2015  . Breast cancer, left breast ([place]) 09/05/2012  . Hypertension     Past Surgical History:  Procedure Laterality Date  . ABDOMINAL HYSTERECTOMY     42 YEARS AGO  . BREAST SURGERY Left 09-06-11   left modified radical mastectomy on September 06, 2011 483.8 cm primary tumor with a 4.6 cm axillary metastasis. 3 of 13 nodes were positive. T2,N1a lesion. This was a triple negative lesion  . CARPAL TUNNEL RELEASE Right September 25, 2014   Skip Estimable, M.D.  . COLON RESECTION  1985  . COLON SURGERY  20 YEARS AGO  . COLONOSCOPY  2012   DR. ELLIOTT  . COLONOSCOPY WITH PROPOFOL N/A 08/25/2016   Procedure: COLONOSCOPY WITH PROPOFOL;  Surgeon: [name], MD;  Location[facility] ENDOSCOPY;  Service: Gastroenterology;  Laterality: N/A;  . ESOPHAGOGASTRODUODENOSCOPY (EGD) WITH PROPOFOL N/A 08/23/2016   Procedure: ESOPHAGOGASTRODUODENOSCOPY (EGD) WITH PROPOFOL;  Surgeon: [name], MD;  Location: [facility] ENDOSCOPY;  Service: Endoscopy;  Laterality: N/A;  . MASTECTOMY Left 2013   left modified radical mastectomy on September 06, 2011 Stage 2; 3.8 cm primary tumor with a 4.6 cm axillary metastasis. 3 of 13 nodes were positive. T2,N1a lesion. This was a triple negative lesion  . UPPER GI ENDOSCOPY  2012   BLEEDING    Prior to Admission medications   Medication Sig Start Date End Date Taking? Authorizing Provider  apixaban (ELIQUIS) 5 MG TABS tablet 10 mg BID for 3 more days then switch to 5 mg BID 08/30/20   Caren Griffins, MD  Ascorbic Acid (VITAMIN C) 1000 MG tablet Take 1,000 mg by mouth daily.    [provider]  Cholecalciferol (VITAMIN D) 50 MCG (2000 UT) CAPS Take 1 capsule by mouth daily.    [provider]  Cranberry 500 MG CAPS Take by mouth daily.    [provider]  DULoxetine (CYMBALTA) 30 MG capsule Take 30 mg by mouth daily. 07/09/20   [provider]  furosemide (LASIX) 20 MG tablet Take 1 tablet (20 mg total) by mouth daily for 3 days. 08/31/20 09/03/20  Caren Griffins, MD  Lactobacillus Rhamnosus, GG, (PROBIOTIC COLIC PO) Take 1 Dose by mouth daily. doterra brands    [provider]  loperamide (IMODIUM A-D) 2 MG tablet Take 2 mg by mouth as needed for diarrhea or loose stools.    [provider]  Melatonin 5 MG CAPS Take 1 capsule by mouth.     [provider]  metoprolol succinate (TOPROL-XL) 25 MG 24 hr tablet Take 25 mg by mouth daily.    [provider]  pantoprazole (PROTONIX) 40 MG tablet TAKE 1 TABLET BY MOUTH ONCE DAILY Patient taking differently: Take 40 mg by mouth daily. 02/22/18   [name], MD  Potassium 99 MG TABS Take 1 tablet by mouth daily.    [provider]  vitamin B-12 (CYANOCOBALAMIN) 1000 MCG tablet Take 1,000 mcg by mouth daily.    [provider]    Allergies Codeine  Family History  Problem Relation Age of Onset  . Lung cancer Brother        colono ca  . Cancer Brother   . Skin cancer Daughter   . Leukemia Father   . Cancer Father   . Colon cancer Mother   . Cancer Mother   . Skin cancer Son   . Kidney disease Neg Hx   .  Bladder Cancer Neg Hx     Social History Social History   Tobacco Use  . Smoking status: Never Smoker  . Smokeless tobacco: Never Used  Vaping Use  . Vaping Use: Never used  Substance Use Topics  . Alcohol use: No  . Drug use: No    Review of Systems  Constitutional: No fever/chills Eyes: No visual changes. ENT: No sore throat. Cardiovascular: Denies chest pain. Respiratory: Positive for shortness of breath. Gastrointestinal: No abdominal pain.  No nausea, no vomiting.  No diarrhea.  No constipation. Genitourinary: Negative for dysuria. Musculoskeletal: Negative for back pain. Skin: Negative for rash. Neurological: Negative for headaches, focal weakness or numbness.  ____________________________________________   PHYSICAL EXAM:  VITAL SIGNS: ED Triage Vitals  Enc Vitals Group     BP      Pulse      Resp      Temp      Temp src      SpO2      Weight      Height      Head Circumference      Peak Flow      Pain Score      Pain Loc      Pain Edu?      Excl. in [place]?     Constitutional: Alert and oriented to person, but not place or time. Eyes: Conjunctivae are normal. Head: Atraumatic. Nose: No congestion/rhinnorhea. Mouth/Throat: Mucous membranes are moist. Neck: Normal ROM Cardiovascular: Normal rate, regular rhythm.  Grossly normal heart sounds. Respiratory: Normal respiratory effort.  No retractions. Lungs CTAB. Gastrointestinal: Soft and nontender. No distention. Genitourinary: deferred Musculoskeletal: No lower extremity tenderness nor edema. Neurologic:  Normal speech and language. No gross focal neurologic deficits are appreciated. Skin:  Skin is warm, dry and intact. No rash noted. Psychiatric: Mood and affect are normal. Speech and behavior are normal.  ____________________________________________   LABS (all labs ordered are listed, but only abnormal results are displayed)  Labs Reviewed  CBC WITH DIFFERENTIAL/PLATELET  BASIC METABOLIC PANEL  BRAIN NATRIURETIC PEPTIDE  TROPONIN I (HIGH SENSITIVITY)  TROPONIN I (HIGH SENSITIVITY)   ____________________________________________  EKG  ED ECG REPORT I, Blake Divine, the attending physician, personally viewed and interpreted this ECG.   Date: 08/31/2020  EKG Time: 13:36  Rate: 75  Rhythm: normal sinus rhythm  Axis: Normal  Intervals:right bundle branch block  ST&T Change: None   PROCEDURES  Procedure(s) performed (including Critical Care):  Procedures   ____________________________________________   INITIAL IMPRESSION / ASSESSMENT AND PLAN / ED COURSE       87 year old female with past medical history of dementia, hypertension, diastolic CHF, PE on Eliquis, GERD, and breast cancer who presents to the ED due to concern for difficulty breathing per staff at her rehab facility.  Patient currently denies any difficulty breathing and is not sure why she is here, she does not appear in any respiratory distress and is maintaining O2 sats on her usual 2 L nasal cannula.  EKG shows no evidence of arrhythmia or ischemia, right bundle branch block is similar to previous.  We will check chest x-ray and labs, plan to reassess.  Chest x-ray reviewed by me and shows mild pulmonary edema which we will treat with Lasix.  Labs are pending  at this time as patient is a difficult stick and IV team unsuccessful in obtaining access in her right upper extremity.  She has a history of left-sided mastectomy 9 years ago and ideally we would  avoid IV placement in her left upper extremity, however with difficulty using her right upper extremity we will proceed with IV placement in left arm to screen labs and give dose of IV Lasix.  Patient turned over to oncoming provider pending lab results and reassessment following Lasix.      ____________________________________________   FINAL CLINICAL IMPRESSION(S) / ED DIAGNOSES  Final diagnoses:  Shortness of breath  Acute on chronic diastolic congestive heart failure [facility])     ED Discharge Orders    None       Note:  This document was prepared using Dragon voice recognition software and may include unintentional dictation errors.   Blake Divine, MD 08/31/20 1455

## 2020-08-31 NOTE — ED Provider Notes (Signed)
  Clinical Course as of 08/31/20 1629  Tue Aug 31, 2020  1603 I reassessed.  Patient is producing urine via pure work after Lasix.  She reports her breathing feels fine and is requesting to return back to her facility.  Respiratory rate 20-22 and she looks well to me. [DS]  4320 Reassessed.  Son now at the bedside.  He reports that patient looks the best he has seen her in recent days.  He reports that she looks better than discharge yesterday.  We discussed unrevealing work-up without evidence of acute cardiopulmonary pathology.  We discussed nonspecific leukocytosis, as well as the fact that all of his cell lines in her CBC are increased, and we discussed other possibilities beyond infection and sepsis, such as dehydration and hemoconcentration.  He reports he is comfortable with this and is thankful for care.  He requests discharge back to facility, as he does not want his mother hospitalized again.  We discussed return precautions for the ED. [DS]    Clinical Course User Index [DS] Vladimir Crofts, MD      Vladimir Crofts, MD 08/31/20 [phone]

## 2020-08-31 NOTE — ED Triage Notes (Signed)
Pt came in ems for sob; hx last visit with DVT PE and UTI; has fell withiun last week' cbg was 238; o2 on 2L at 99%;

## 2020-09-13 ENCOUNTER — Other Ambulatory Visit: Payer: Medicare Other

## 2020-09-13 ENCOUNTER — Ambulatory Visit: Payer: Medicare Other | Admitting: Oncology

## 2020-09-16 ENCOUNTER — Encounter: Payer: Self-pay | Admitting: Primary Care

## 2020-09-16 ENCOUNTER — Other Ambulatory Visit: Payer: Self-pay

## 2020-09-16 ENCOUNTER — Non-Acute Institutional Stay: Payer: Medicare Other | Admitting: Primary Care

## 2020-09-16 DIAGNOSIS — Z515 Encounter for palliative care: Secondary | ICD-10-CM

## 2020-09-16 DIAGNOSIS — R6521 Severe sepsis with septic shock: Secondary | ICD-10-CM

## 2020-09-16 DIAGNOSIS — A419 Sepsis, unspecified organism: Secondary | ICD-10-CM

## 2020-09-16 DIAGNOSIS — N390 Urinary tract infection, site not specified: Secondary | ICD-10-CM

## 2020-09-16 DIAGNOSIS — S32810D Multiple fractures of pelvis with stable disruption of pelvic ring, subsequent encounter for fracture with routine healing: Secondary | ICD-10-CM

## 2020-09-16 NOTE — Progress Notes (Signed)
Therapist, nutritional Palliative Care Consult Note Telephone: [phone]  Fax: [phone]    Date of encounter: 09/16/20 PATIENT NAME: [name] [address] [place] [address]   [phone] (home)  DOB: 10/01/1920 MRN: [id number] PRIMARY CARE PROVIDER:    Jerl Mina, MD,  [address] [place] [address] [phone]  REFERRING PROVIDER:   Rosetta Posner, MD [address] [place],  [place] [address] [phone]  RESPONSIBLE PARTY:    Contact Information    Name Relation Home Work [place] Dennie Bible Daughter [phone]  [phone]   Shantice, Menger [phone]  [phone]   [facility] Son   [phone]   [place], [place]   [phone]       I met face to face with patient in facility. I then spoke with daughter by phone. Palliative Care was asked to follow this patient by consultation request of  Rosetta Posner, MD to address advance care planning and complex medical decision making. This is the initial visit.                            ASSESSMENT AND PLAN / RECOMMENDATIONS:   Advance Care Planning/Goals of Care: Goals include to maximize quality of life and symptom management. Our advance care planning conversation included a discussion about:     The value and importance of advance care planning   Experiences with loved ones who have been seriously ill or have died   Exploration of personal, cultural or spiritual beliefs that might influence medical decisions   Exploration of goals of care in the event of a sudden injury or illness   Identification of a healthcare agent - daughter  Discussed LTC for patient if she could not stay in skilled area.   Review  of an  advance directive document. Has DNR.  CODE STATUS: DNR on file  Daughter outlined recent events whereby her mother left the home in the night and fell into a ditch by her home. She sustained a pelvis fracture, and  hypothermia from exposure. She was found to have sepsis and recovered from this. She does have pain from the fx but cannot tolerate pain medications without nausea. Daughter knows she cannot come home with this limited mobility. She had been continent and daughter states she has diaper on now which is demeaning to her. However it is painful to sit.  Symptom Management/Plan:  Oxygen: Daughter endorses not being on oxygen at baseline. Please attempt oxygen weaning. She was on 2 L today. The Sx she has had has also resolved.   Pain: Endorses pain with PT attempts, pt has not been able to make good headway with the therapy due to pain. Consider tramadol 25 mg po qid / as a pre -med for therapeutic interventions. She should have ATC Tylenol CR as well.  She may need revisit of potential for rehab once pelvis is healed.  Mobility: Discussed her walking, could walk some distance in the room today but has had min achievement with PT due to pain, see above. Would recommend continued PT  In place for continued rehab as pelvis heals.   Follow up Palliative Care Visit: Palliative care will continue to follow for complex medical decision making, advance care planning, and clarification of goals. Return 4 weeks or prn.  I spent 45 minutes providing this consultation. More than 50% of the time in this consultation was  spent in counseling and care coordination.  PPS: 30%  HOSPICE ELIGIBILITY/DIAGNOSIS: TBD  Chief Complaint: pain, immobility  HISTORY OF PRESENT ILLNESS:  KEILAH Weeks is a 87 y.o. year old female  with recent fall and sepsis, seen today for sequelae. Has had pain from pelvic fx sustained in a fall Was living alone and fell outside. Has had slow rehab course due to pain management. Has some insensitivity to pain medications .   History obtained from review of EMR, discussion with primary team, and interview with family, facility staff/caregiver and/or Ms. Widmayer.  I reviewed available labs,  medications, imaging, studies and related documents from the EMR.  Records reviewed and summarized above.   ROS/ daughter  General: NAD ENMT: denies dysphagia Cardiovascular: denies chest pain, denies DOE Pulmonary: denies cough, denies increased SOB Abdomen: endorses fair appetite, denies constipation, endorses continence of bowel GU: denies dysuria, endorses continence of urine MSK:  endorses weakness,  no falls reported at snf Skin: denies rashes or wounds Neurological: endorses  pain, denies insomnia Psych: Endorses positive mood Heme/lymph/immuno: denies bruises, abnormal bleeding  Physical Exam: Current and past weights:  163 lbs. Constitutional: NAD General: frail appearing EYES: anicteric sclera, lids intact, no discharge  ENMT: intact hearing, oral mucous membranes moist, dentition intact CV: RRR, no LE edema Pulmonary: no increased work of breathing, no cough, room air Abdomen: intake 50%, no ascites MSK:  Severe sarcopenia, moves all extremities, min. ambulatory Skin: warm and dry, no rashes or wounds on visible skin Neuro:  + generalized weakness, advanced cognitive impairment Psych: non-anxious affect, A and O x 2 Hem/lymph/immuno: no widespread bruising   CURRENT PROBLEM LIST:  Patient Active Problem List   Diagnosis Date Noted  . Severe sepsis with septic shock ([place]) 08/21/2020  . Depression 08/21/2020  . Acute metabolic encephalopathy 59/93/5701  . Fall at home, initial encounter 08/21/2020  . Pelvic ring fracture ([place]) 08/21/2020  . Hypokalemia 08/21/2020  . Hyperglycemia 08/21/2020  . Functional diarrhea 07/29/2019  . Open wound of left knee, leg, and ankle with complication 77/93/9030  . Chest wall pain 10/08/2017  . History of recurrent urinary tract infection 02/03/2017  . Vaginal pessary in situ 02/03/2017  . Iron deficiency anemia due to chronic blood loss 12/01/2016  . Personal history of malignant neoplasm of breast 11/09/2016  . GI bleed  08/21/2016  . Hereditary and idiopathic peripheral neuropathy 09/09/2015  . Malignant neoplasm of left female breast ([place]) 09/09/2015  . Prolapse of vaginal wall with midline cystocele 08/10/2015  . Incomplete bladder emptying 08/10/2015  . Midline low back pain without sciatica 08/10/2015  . Fecal incontinence 08/10/2015  . Recurrent UTI 07/19/2015  . Atrophic vaginitis 07/19/2015  . Adenomatous colon polyp 02/24/2015  . Anemia 02/24/2015  . Chronic sinusitis 02/24/2015  . Diverticulosis 02/24/2015  . Esophagitis 02/24/2015  . GERD (gastroesophageal reflux disease) 02/24/2015  . Hiatal hernia 02/24/2015  . Breast cancer, left breast ([place]) 09/05/2012  . Hypertension    PAST MEDICAL HISTORY:  Active Ambulatory Problems    Diagnosis Date Noted  . Hypertension   . Breast cancer, left breast ([place]) 09/05/2012  . Adenomatous colon polyp 02/24/2015  . Anemia 02/24/2015  . Chronic sinusitis 02/24/2015  . Diverticulosis 02/24/2015  . Esophagitis 02/24/2015  . GERD (gastroesophageal reflux disease) 02/24/2015  . Hiatal hernia 02/24/2015  . Recurrent UTI 07/19/2015  . Atrophic vaginitis 07/19/2015  . Prolapse of vaginal wall with midline cystocele 08/10/2015  . Incomplete bladder emptying 08/10/2015  . Midline low  back pain without sciatica 08/10/2015  . Fecal incontinence 08/10/2015  . Hereditary and idiopathic peripheral neuropathy 09/09/2015  . Malignant neoplasm of left female breast ([place]) 09/09/2015  . GI bleed 08/21/2016  . Personal history of malignant neoplasm of breast 11/09/2016  . Iron deficiency anemia due to chronic blood loss 12/01/2016  . History of recurrent urinary tract infection 02/03/2017  . Vaginal pessary in situ 02/03/2017  . Chest wall pain 10/08/2017  . Open wound of left knee, leg, and ankle with complication 16/02/9603  . Functional diarrhea 07/29/2019  . Severe sepsis with septic shock ([place]) 08/21/2020  . Depression 08/21/2020  . Acute metabolic  encephalopathy 54/01/8118  . Fall at home, initial encounter 08/21/2020  . Pelvic ring fracture ([place]) 08/21/2020  . Hypokalemia 08/21/2020  . Hyperglycemia 08/21/2020   Resolved Ambulatory Problems    Diagnosis Date Noted  . Vaginal ulcer 07/19/2015  . Vaginal atrophy 08/10/2015   Past Medical History:  Diagnosis Date  . Allergy   . Arthritis   . Benign neoplasm of skin, site unspecified   . Bowel trouble   . Breast cancer ([place])   . Cancer ([place])   . Cancer of breast [facility]) April 10,.2013  . Cataract   . Colon cancer ([place])   . Hard of hearing   . Heartburn   . History of stomach ulcers   . History of stomach ulcers   . Shingles Dec 2015  . Wrist fracture, right    SOCIAL HX:  Social History   Tobacco Use  . Smoking status: Never Smoker  . Smokeless tobacco: Never Used  Substance Use Topics  . Alcohol use: No   FAMILY HX:  Family History  Problem Relation Age of Onset  . Lung cancer Brother        colono ca  . Cancer Brother   . Skin cancer Daughter   . Leukemia Father   . Cancer Father   . Colon cancer Mother   . Cancer Mother   . Skin cancer Son   . Kidney disease Neg Hx   . Bladder Cancer Neg Hx       ALLERGIES:  Allergies  Allergen Reactions  . Codeine Nausea And Vomiting     PERTINENT MEDICATIONS:  Outpatient Encounter Medications as of 09/16/2020  Medication Sig  . apixaban (ELIQUIS) 5 MG TABS tablet 10 mg BID for 3 more days then switch to 5 mg BID  . Ascorbic Acid (VITAMIN C) 1000 MG tablet Take 1,000 mg by mouth daily.  . Cholecalciferol (VITAMIN D) 50 MCG (2000 UT) CAPS Take 1 capsule by mouth daily.  . Cranberry 500 MG CAPS Take by mouth daily.  . DULoxetine (CYMBALTA) 30 MG capsule Take 30 mg by mouth daily.  . furosemide (LASIX) 20 MG tablet Take 1 tablet (20 mg total) by mouth daily for 3 days.  . Lactobacillus Rhamnosus, GG, (PROBIOTIC COLIC PO) Take 1 Dose by mouth daily. doterra brands  . loperamide (IMODIUM A-D) 2 MG tablet Take 2 mg  by mouth as needed for diarrhea or loose stools.  . Melatonin 5 MG CAPS Take 1 capsule by mouth.   . metoprolol succinate (TOPROL-XL) 25 MG 24 hr tablet Take 25 mg by mouth daily.  . pantoprazole (PROTONIX) 40 MG tablet TAKE 1 TABLET BY MOUTH ONCE DAILY (Patient taking differently: Take 40 mg by mouth daily.)  . Potassium 99 MG TABS Take 1 tablet by mouth daily.  . vitamin B-12 (CYANOCOBALAMIN) 1000 MCG tablet Take 1,000  mcg by mouth daily.   No facility-administered encounter medications on file as of 09/16/2020.    Thank you for the opportunity to participate in the care of Ms. Stinson.  The palliative care team will continue to follow. Please call our office at [phone] if we can be of additional assistance.   Jason Coop, NP , DNP, MPH, AGPCNP-BC, ACHPN  COVID-19 PATIENT SCREENING TOOL Asked and negative response unless otherwise noted:   Have you had symptoms of covid, tested positive or been in contact with someone with symptoms/positive test in the past 5-10 days?

## 2020-10-20 ENCOUNTER — Other Ambulatory Visit: Payer: Self-pay

## 2020-10-20 ENCOUNTER — Ambulatory Visit: Payer: Medicare Other | Admitting: Dermatology

## 2020-10-20 ENCOUNTER — Non-Acute Institutional Stay: Payer: Medicare Other | Admitting: Primary Care

## 2020-10-20 DIAGNOSIS — S32810D Multiple fractures of pelvis with stable disruption of pelvic ring, subsequent encounter for fracture with routine healing: Secondary | ICD-10-CM

## 2020-10-20 DIAGNOSIS — Z515 Encounter for palliative care: Secondary | ICD-10-CM

## 2020-10-20 NOTE — Progress Notes (Signed)
[facility] Consult Note Telephone: [phone]  Fax: [phone]    Date of encounter: 10/20/20 PATIENT NAME: [name] [address] [place] [address]   [phone] (home)  DOB: 01-14-1921 MRN: [id number] PRIMARY CARE PROVIDER:    [name], MD,  [facility] [place] [address] [phone]  REFERRING PROVIDER:   Dr Rica Koyanagi [facility] [place], [place]   RESPONSIBLE PARTY:    Contact Information    Name Relation Home Work [place]), [name] Daughter [phone]  [phone]   [place], Koker [phone]  [phone]   [facility] Son   [phone]   Deliyah, Muckle   [phone]       I met face to face with patient  In [facility]. Palliative Care was asked to follow this patient by consultation request of  Dr Barbara Cower to address advance care planning and complex medical decision making. This is a follow up visit.                                   ASSESSMENT AND PLAN / RECOMMENDATIONS:   Advance Care Planning/Goals of Care: Goals include to maximize quality of life and symptom management. Our advance care planning conversation included a discussion about:     Exploration of personal, cultural or spiritual beliefs that might influence medical decisions   CODE STATUS: DNR  Symptom Management/Plan:   F/u for ongoing sx management. Reports improved pain and states she is more active now. She has hearing loss, and did not appear to have her aides in. She has had hair and nails done recently which has made her feel better. Family is optimistic that she is doing so well with PT now. She is responding well and still has hopes to return home. She has ambulated down hall with help. ST is also assessing for cognition. Short term memory is quite poor which will impact adls. Discussed preparations for  d/c as soon as therapy is over.   Follow up Palliative Care Visit: Palliative  care will continue to follow for complex medical decision making, advance care planning, and clarification of goals. Return 6-8 weeks or prn.  I spent 35 minutes providing this consultation. More than 50% of the time in this consultation was spent in counseling and care coordination.  PPS: 30%  HOSPICE ELIGIBILITY/DIAGNOSIS: TBD  Chief Complaint: debility  HISTORY OF PRESENT ILLNESS:  SUNDAI Weeks is a 87 y.o. year old female  with  Dementia, falls, debility and immobility and recent UTI. She had a fall and sustained several pelvic fractures. Today she is sitting up comfortably after lunch. She denies pain.  History obtained from review of EMR, discussion with primary team, and interview with family, facility staff/caregiver and/or Ms. Schweiss.  I reviewed available labs, medications, imaging, studies and related documents from the EMR.  Records reviewed and summarized above.   ROS/staff  General: NAD ENMT: denies dysphagia Cardiovascular: denies chest pain, denies DOE Pulmonary: denies cough, denies increased SOB Abdomen: endorses good appetite, denies constipation, endorses incontinence of bowel GU: denies dysuria, endorses incontinence of urine MSK:  + weakness,  no falls reported Skin: denies rashes or wounds Neurological: denies pain, denies insomnia Psych: Endorses positive mood Heme/lymph/immuno: denies bruises, abnormal bleeding  Physical Exam: Current and past weights: 153 lbs Constitutional: NAD General: frail appearing, WNWD EYES: anicteric sclera,  lids intact, no discharge  ENMT: hard of  hearing, oral mucous membranes moist, dentition intact Pulmonary: no increased work of breathing, no cough, room air Abdomen: intake 75%,  no ascites GU: deferred MSK: ++ sarcopenia, moves all extremities, ambulatory with standby Skin: warm and dry, no rashes or wounds on visible skin Neuro:  ++ generalized weakness,  ++ cognitive impairment Psych: non-anxious affect, A and O x  1 Hem/lymph/immuno: no widespread bruising   Thank you for the opportunity to participate in the care of Ms. Altergott.  The palliative care team will continue to follow. Please call our office at [phone] if we can be of additional assistance.   Jason Coop, NP , DNP, MPH, AGPCNP-BC, ACHPN  COVID-19 PATIENT SCREENING TOOL Asked and negative response unless otherwise noted:   Have you had symptoms of covid, tested positive or been in contact with someone with symptoms/positive test in the past 5-10 days?

## 2020-10-23 ENCOUNTER — Emergency Department
Admission: EM | Admit: 2020-10-23 | Discharge: 2020-10-23 | Disposition: A | Discharge status: Home / Self Care | Payer: Medicare Other | Attending: Emergency Medicine | Admitting: Emergency Medicine

## 2020-10-23 ENCOUNTER — Other Ambulatory Visit: Payer: Self-pay

## 2020-10-23 DIAGNOSIS — Z20822 Contact with and (suspected) exposure to covid-19: Secondary | ICD-10-CM | POA: Diagnosis not present

## 2020-10-23 DIAGNOSIS — R4182 Altered mental status, unspecified: Secondary | ICD-10-CM | POA: Diagnosis not present

## 2020-10-23 DIAGNOSIS — B9689 Other specified bacterial agents as the cause of diseases classified elsewhere: Secondary | ICD-10-CM | POA: Insufficient documentation

## 2020-10-23 DIAGNOSIS — R5383 Other fatigue: Secondary | ICD-10-CM | POA: Diagnosis present

## 2020-10-23 DIAGNOSIS — Z7901 Long term (current) use of anticoagulants: Secondary | ICD-10-CM | POA: Diagnosis not present

## 2020-10-23 DIAGNOSIS — Z853 Personal history of malignant neoplasm of breast: Secondary | ICD-10-CM | POA: Insufficient documentation

## 2020-10-23 DIAGNOSIS — N390 Urinary tract infection, site not specified: Secondary | ICD-10-CM

## 2020-10-23 DIAGNOSIS — Z79899 Other long term (current) drug therapy: Secondary | ICD-10-CM | POA: Diagnosis not present

## 2020-10-23 DIAGNOSIS — I10 Essential (primary) hypertension: Secondary | ICD-10-CM | POA: Insufficient documentation

## 2020-10-23 DIAGNOSIS — Z85038 Personal history of other malignant neoplasm of large intestine: Secondary | ICD-10-CM | POA: Insufficient documentation

## 2020-10-23 DIAGNOSIS — Z85828 Personal history of other malignant neoplasm of skin: Secondary | ICD-10-CM | POA: Diagnosis not present

## 2020-10-23 DIAGNOSIS — R531 Weakness: Secondary | ICD-10-CM | POA: Diagnosis not present

## 2020-10-23 LAB — COMPREHENSIVE METABOLIC PANEL
ALT: 9 U/L (ref 0–44)
AST: 13 U/L — ABNORMAL LOW (ref 15–41)
Albumin: 3.3 g/dL — ABNORMAL LOW (ref 3.5–5.0)
Alkaline Phosphatase: 77 U/L (ref 38–126)
Anion gap: 11 (ref 5–15)
BUN: 16 mg/dL (ref 8–23)
CO2: 25 mmol/L (ref 22–32)
Calcium: 9.2 mg/dL (ref 8.9–10.3)
Chloride: 103 mmol/L (ref 98–111)
Creatinine, Ser: 0.55 mg/dL (ref 0.44–1.00)
GFR, Estimated: >60 mL/min (ref >60)
Glucose, Bld: 99 mg/dL (ref 70–99)
Potassium: 3.5 mmol/L (ref 3.5–5.1)
Sodium: 139 mmol/L (ref 135–145)
Total Bilirubin: 0.8 mg/dL (ref 0.3–1.2)
Total Protein: 6.8 g/dL (ref 6.5–8.1)

## 2020-10-23 LAB — URINALYSIS, COMPLETE (UACMP) WITH MICROSCOPIC
Bilirubin Urine: NEGATIVE
Glucose, UA: NEGATIVE mg/dL
Hgb urine dipstick: NEGATIVE
Ketones, ur: NEGATIVE mg/dL
Nitrite: NEGATIVE
Protein, ur: NEGATIVE mg/dL
Specific Gravity, Urine: 1.009 (ref 1.005–1.030)
pH: 5 (ref 5.0–8.0)

## 2020-10-23 LAB — CBC
HCT: 37.2 % (ref 36.0–46.0)
Hemoglobin: 12 g/dL (ref 12.0–15.0)
MCH: 28.2 pg (ref 26.0–34.0)
MCHC: 32.3 g/dL (ref 30.0–36.0)
MCV: 87.5 fL (ref 80.0–100.0)
Platelets: 427 10*3/uL — ABNORMAL HIGH (ref 150–400)
RBC: 4.25 MIL/uL (ref 3.87–5.11)
RDW: 15.8 % — ABNORMAL HIGH (ref 11.5–15.5)
WBC: 10.8 10*3/uL — ABNORMAL HIGH (ref 4.0–10.5)
nRBC: 0 % (ref 0.0–0.2)

## 2020-10-23 LAB — RESP PANEL BY RT-PCR (FLU A&B, COVID) ARPGX2
Influenza A by PCR: NEGATIVE
Influenza B by PCR: NEGATIVE
SARS Coronavirus 2 by RT PCR: NEGATIVE

## 2020-10-23 MED ORDER — SODIUM CHLORIDE 0.9 % IV SOLN: Freq: Once | INTRAVENOUS | Status: AC

## 2020-10-23 MED ORDER — SODIUM CHLORIDE 0.9 % IV SOLN
1.0000 g | Freq: Once | INTRAVENOUS | Status: AC
  Administered 2020-10-23: 1 g via INTRAVENOUS
  Filled 2020-10-23: qty 10

## 2020-10-23 MED ORDER — CEPHALEXIN 250 MG PO CAPS: 250.0000 mg | ORAL_CAPSULE | Freq: Three times a day (TID) | ORAL | 0 refills | Status: AC

## 2020-10-23 NOTE — ED Notes (Signed)
Pt has bruises all over legs and some on arms too. Is on blood thinners per daughter.

## 2020-10-23 NOTE — ED Notes (Signed)
Daughter to bedside

## 2020-10-23 NOTE — ED Notes (Signed)
Daughter remains at bedside. Pt sleeping.

## 2020-10-23 NOTE — ED Notes (Signed)
Mikle Bosworth RN from [facility] received report from this RN. Pt leaving with EMS now.

## 2020-10-23 NOTE — ED Notes (Signed)
Called [facility] @ 9:54pm to transport patient back to [facility]

## 2020-10-23 NOTE — ED Notes (Signed)
Pt to d/c to facility via EMS once antibiotic finished per daughter's request. Daughter verbal understanding that she may receive bill from EMS since pt can sit in wheelchair.

## 2020-10-23 NOTE — ED Notes (Signed)
Pt calm and cooperative. Skin dry. Resp reg/unlabored. Confused; sometimes holds regular conversation and other times replies to questions with inappropriate answers that don't make sense.

## 2020-10-23 NOTE — ED Provider Notes (Signed)
[facility] Emergency Department Provider Note  Time seen: 6:24 PM  I have reviewed the triage vital signs and the nursing notes.   HISTORY  Chief Complaint Altered Mental Status   HPI [name] is a 87 y.o. female with a past medical history of arthritis, hypertension, presents to the emergency department from a nursing facility for weakness and fatigue.  According to EMS report patient comes from her facility in which they noted the patient today to be less active and less talkative which is a change from her baseline.   States in the past when she has gotten like this it has been due to an infection so they were concerned and sent her to the emergency department for evaluation.  They state they first noted the symptoms last night.  Here the patient is awake alert she has no complaints she is calm cooperative and pleasant.  Negative review of systems per patient.  Past Medical History:  Diagnosis Date  . Allergy   . Arthritis    back  . Benign neoplasm of skin, site unspecified   . Bowel trouble   . Breast cancer ([place])   . Cancer ([place])    colon 20 years ago  . Cancer of breast [facility]) April 10,.2013   Left breast, 3.8 cm, 4 cm axillary node, T2, N1a, ER negative PR negative, HER-2/neu not amplified.  . Cataract   . Colon cancer ([place])   . Hard of hearing   . Heartburn   . Hiatal hernia   . History of stomach ulcers   . History of stomach ulcers   . Hypertension 2012  . Personal history of malignant neoplasm of breast 2013   LEFT MASTECTOMY,left modified radical mastectomy on September 06, 2011 483.8 cm primary tumor with a 4.6 cm axillary metastasis. 3 of 13 nodes were positive. T2,N1a lesion. This was a triple negative lesion  . Shingles Dec 2015  . Vaginal atrophy 08/10/2015  . Wrist fracture, right     Patient Active Problem List   Diagnosis Date Noted  . Severe sepsis with septic shock ([place]) 08/21/2020  . Depression 08/21/2020  . Acute metabolic  encephalopathy 62/83/6629  . Fall at home, initial encounter 08/21/2020  . Pelvic ring fracture ([place]) 08/21/2020  . Hypokalemia 08/21/2020  . Hyperglycemia 08/21/2020  . Functional diarrhea 07/29/2019  . Open wound of left knee, leg, and ankle with complication 47/65/4650  . Chest wall pain 10/08/2017  . History of recurrent urinary tract infection 02/03/2017  . Vaginal pessary in situ 02/03/2017  . Iron deficiency anemia due to chronic blood loss 12/01/2016  . Personal history of malignant neoplasm of breast 11/09/2016  . GI bleed 08/21/2016  . Hereditary and idiopathic peripheral neuropathy 09/09/2015  . Malignant neoplasm of left female breast ([place]) 09/09/2015  . Prolapse of vaginal wall with midline cystocele 08/10/2015  . Incomplete bladder emptying 08/10/2015  . Midline low back pain without sciatica 08/10/2015  . Fecal incontinence 08/10/2015  . Recurrent UTI 07/19/2015  . Atrophic vaginitis 07/19/2015  . Adenomatous colon polyp 02/24/2015  . Anemia 02/24/2015  . Chronic sinusitis 02/24/2015  . Diverticulosis 02/24/2015  . Esophagitis 02/24/2015  . GERD (gastroesophageal reflux disease) 02/24/2015  . Hiatal hernia 02/24/2015  . Breast cancer, left breast ([place]) 09/05/2012  . Hypertension     Past Surgical History:  Procedure Laterality Date  . ABDOMINAL HYSTERECTOMY     42 YEARS AGO  . BREAST SURGERY Left 09-06-11   left modified  radical mastectomy on September 06, 2011 483.8 cm primary tumor with a 4.6 cm axillary metastasis. 3 of 13 nodes were positive. T2,N1a lesion. This was a triple negative lesion  . CARPAL TUNNEL RELEASE Right September 25, 2014   Skip Estimable, M.D.  . COLON RESECTION  1985  . COLON SURGERY  20 YEARS AGO  . COLONOSCOPY  2012   DR. ELLIOTT  . COLONOSCOPY WITH PROPOFOL N/A 08/25/2016   Procedure: COLONOSCOPY WITH PROPOFOL;  Surgeon: [name], MD;  Location: [facility] ENDOSCOPY;  Service: Gastroenterology;  Laterality: N/A;  . ESOPHAGOGASTRODUODENOSCOPY  (EGD) WITH PROPOFOL N/A 08/23/2016   Procedure: ESOPHAGOGASTRODUODENOSCOPY (EGD) WITH PROPOFOL;  Surgeon: [name], MD;  Location: [facility] ENDOSCOPY;  Service: Endoscopy;  Laterality: N/A;  . MASTECTOMY Left 2013   left modified radical mastectomy on September 06, 2011 Stage 2; 3.8 cm primary tumor with a 4.6 cm axillary metastasis. 3 of 13 nodes were positive. T2,N1a lesion. This was a triple negative lesion  . UPPER GI ENDOSCOPY  2012   BLEEDING    Prior to Admission medications   Medication Sig Start Date End Date Taking? Authorizing Provider  apixaban (ELIQUIS) 5 MG TABS tablet 10 mg BID for 3 more days then switch to 5 mg BID 08/30/20   Caren Griffins, MD  Ascorbic Acid (VITAMIN C) 1000 MG tablet Take 1,000 mg by mouth daily.    [provider]  Cholecalciferol (VITAMIN D) 50 MCG (2000 UT) CAPS Take 1 capsule by mouth daily.    [provider]  Cranberry 500 MG CAPS Take by mouth daily.    [provider]  DULoxetine (CYMBALTA) 20 MG capsule Take 20 mg by mouth daily.    [provider]  furosemide (LASIX) 20 MG tablet Take 1 tablet (20 mg total) by mouth daily for 3 days. 08/31/20 09/03/20  Caren Griffins, MD  Lactobacillus Rhamnosus, GG, (PROBIOTIC COLIC PO) Take 1 Dose by mouth daily. doterra brands    [provider]  loperamide (IMODIUM A-D) 2 MG tablet Take 2 mg by mouth as needed for diarrhea or loose stools.    [provider]  Melatonin 5 MG CAPS Take 1 capsule by mouth.     [provider]  metoprolol succinate (TOPROL-XL) 25 MG 24 hr tablet Take 25 mg by mouth daily.    [provider]  pantoprazole (PROTONIX) 40 MG tablet TAKE 1 TABLET BY MOUTH ONCE DAILY Patient taking differently: Take 40 mg by mouth daily. 02/22/18   [name], MD  Potassium 99 MG TABS Take 1 tablet by mouth daily.    [provider]  vitamin B-12 (CYANOCOBALAMIN) 1000 MCG tablet Take 1,000 mcg by mouth daily.    [provider]    Allergies  Allergen Reactions  . Codeine Nausea And Vomiting    Family History  Problem Relation Age of Onset  . Lung cancer Brother        colono ca  . Cancer Brother   . Skin cancer Daughter   . Leukemia Father   . Cancer Father   . Colon cancer Mother   . Cancer Mother   . Skin cancer Son   . Kidney disease Neg Hx   . Bladder Cancer Neg Hx     Social History Social History   Tobacco Use  . Smoking status: Never Smoker  . Smokeless tobacco: Never Used  Vaping Use  . Vaping Use: Never used  Substance Use Topics  . Alcohol use:  No  . Drug use: No    Review of Systems Constitutional: Negative for fever. Cardiovascular: Negative for chest pain. Respiratory: Negative for shortness of breath.  Negative for cough. Gastrointestinal: Negative for abdominal pain, vomiting and diarrhea. Genitourinary: Negative for urinary compaints Musculoskeletal: Negative for musculoskeletal complaints Neurological: Negative for headache All other ROS negative  ____________________________________________   PHYSICAL EXAM:  VITAL SIGNS: ED Triage Vitals [10/23/20 1820]  Enc Vitals Group     BP (!) 141/66     Pulse Rate 74     Resp 18     Temp 98.3 F (36.8 C)     Temp Source Oral     SpO2 96 %     Weight      Height      Head Circumference      [facility] Flow      Pain Score      Pain Loc      Pain Edu?      Excl. in [place]?    Constitutional: Awake alert, calm cooperative and pleasant.  No distress. Eyes: Normal exam ENT      Head: Normocephalic and atraumatic.      Mouth/Throat: Mucous membranes are moist. Cardiovascular: Normal rate, regular rhythm.  Respiratory: Normal respiratory effort without tachypnea nor retractions. Breath sounds are clear  Gastrointestinal: Soft and nontender. No distention.   Musculoskeletal: Nontender with normal range of motion in all extremities.  Neurologic:  Normal speech and language. No gross focal neurologic  deficits Skin:  Skin is warm, dry and intact.  Psychiatric: Mood and affect are normal.  ____________________________________________   INITIAL IMPRESSION / ASSESSMENT AND PLAN / ED COURSE  Pertinent labs & imaging results that were available during my care of the patient were reviewed by me and considered in my medical decision making (see chart for details).   Patient presents emergency department for weakness/fatigue since last night.  Currently the patient appears well, reassuring vitals, reassuring physical exam.  Patient is calm cooperative and pleasant and she has no complaints.  However given the nursing home concerns we will obtain labs, COVID swab, urinalysis and continue to closely monitor.  Patient is agreeable to work-up.  Patient's work-up has resulted overall reassuring besides a urinary tract infection on her urinalysis.  We will add on a urine culture.  Start the patient on IV Rocephin.  Given the otherwise reassuring work-up I believe the patient will be safe for discharge home after IV Rocephin.  Patient and daughter agreeable to plan.  [name] was evaluated in Emergency Department on 10/23/2020 for the symptoms described in the history of present illness. She was evaluated in the context of the global COVID-19 pandemic, which necessitated consideration that the patient might be at risk for infection with the SARS-CoV-2 virus that causes COVID-19. Institutional protocols and algorithms that pertain to the evaluation of patients at risk for COVID-19 are in a state of rapid change based on information released by regulatory bodies including the CDC and federal and state organizations. These policies and algorithms were followed during the patient's care in the ED.  ____________________________________________   FINAL CLINICAL IMPRESSION(S) / ED DIAGNOSES  Weakness Urinary tract infection   Harvest Dark, MD 10/23/20 2114

## 2020-10-23 NOTE — ED Notes (Signed)
Pt briefs changed; were full of urine from facility; peri care provided; pt attempting to provide urine sample in bedpan; understands if unable may need I&O cath; repositioned in stretcher.

## 2020-10-23 NOTE — ED Notes (Signed)
Peri care provided. Urine sample sent to lab. Pt repositioned.

## 2020-10-23 NOTE — ED Triage Notes (Signed)
Pt in via EMS from [facility] due to AMS since last night. VSS. BG WDL.

## 2020-10-26 LAB — URINE CULTURE: Culture: >=100000 — AB

## 2020-10-31 ENCOUNTER — Inpatient Hospital Stay
Admission: EM | Admit: 2020-10-31 | Discharge: 2020-11-02 | DRG: 377 | Disposition: A | Discharge status: Skilled Nursing Facility | Payer: Medicare Other | Attending: Internal Medicine | Admitting: Internal Medicine

## 2020-10-31 ENCOUNTER — Other Ambulatory Visit: Payer: Self-pay

## 2020-10-31 DIAGNOSIS — Z86718 Personal history of other venous thrombosis and embolism: Secondary | ICD-10-CM

## 2020-10-31 DIAGNOSIS — Z9012 Acquired absence of left breast and nipple: Secondary | ICD-10-CM | POA: Diagnosis not present

## 2020-10-31 DIAGNOSIS — W19XXXD Unspecified fall, subsequent encounter: Secondary | ICD-10-CM | POA: Diagnosis present

## 2020-10-31 DIAGNOSIS — I10 Essential (primary) hypertension: Secondary | ICD-10-CM | POA: Diagnosis present

## 2020-10-31 DIAGNOSIS — Z853 Personal history of malignant neoplasm of breast: Secondary | ICD-10-CM

## 2020-10-31 DIAGNOSIS — K920 Hematemesis: Secondary | ICD-10-CM | POA: Diagnosis not present

## 2020-10-31 DIAGNOSIS — H9190 Unspecified hearing loss, unspecified ear: Secondary | ICD-10-CM | POA: Diagnosis present

## 2020-10-31 DIAGNOSIS — K219 Gastro-esophageal reflux disease without esophagitis: Secondary | ICD-10-CM | POA: Diagnosis present

## 2020-10-31 DIAGNOSIS — M479 Spondylosis, unspecified: Secondary | ICD-10-CM | POA: Diagnosis present

## 2020-10-31 DIAGNOSIS — Z85828 Personal history of other malignant neoplasm of skin: Secondary | ICD-10-CM | POA: Diagnosis not present

## 2020-10-31 DIAGNOSIS — F32A Depression, unspecified: Secondary | ICD-10-CM | POA: Diagnosis present

## 2020-10-31 DIAGNOSIS — U071 COVID-19: Secondary | ICD-10-CM | POA: Diagnosis present

## 2020-10-31 DIAGNOSIS — Z8711 Personal history of peptic ulcer disease: Secondary | ICD-10-CM

## 2020-10-31 DIAGNOSIS — Z66 Do not resuscitate: Secondary | ICD-10-CM | POA: Diagnosis present

## 2020-10-31 DIAGNOSIS — S329XXD Fracture of unspecified parts of lumbosacral spine and pelvis, subsequent encounter for fracture with routine healing: Secondary | ICD-10-CM

## 2020-10-31 DIAGNOSIS — Z79899 Other long term (current) drug therapy: Secondary | ICD-10-CM

## 2020-10-31 DIAGNOSIS — Z885 Allergy status to narcotic agent status: Secondary | ICD-10-CM | POA: Diagnosis not present

## 2020-10-31 DIAGNOSIS — Z7901 Long term (current) use of anticoagulants: Secondary | ICD-10-CM | POA: Diagnosis not present

## 2020-10-31 DIAGNOSIS — K922 Gastrointestinal hemorrhage, unspecified: Secondary | ICD-10-CM | POA: Diagnosis not present

## 2020-10-31 DIAGNOSIS — Z85038 Personal history of other malignant neoplasm of large intestine: Secondary | ICD-10-CM | POA: Diagnosis not present

## 2020-10-31 DIAGNOSIS — Z86711 Personal history of pulmonary embolism: Secondary | ICD-10-CM | POA: Diagnosis present

## 2020-10-31 LAB — GLUCOSE, CAPILLARY: Glucose-Capillary: 193 mg/dL — ABNORMAL HIGH (ref 70–99)

## 2020-10-31 LAB — COMPREHENSIVE METABOLIC PANEL
ALT: 10 U/L (ref 0–44)
AST: 13 U/L — ABNORMAL LOW (ref 15–41)
Albumin: 3.4 g/dL — ABNORMAL LOW (ref 3.5–5.0)
Alkaline Phosphatase: 88 U/L (ref 38–126)
Anion gap: 12 (ref 5–15)
BUN: 12 mg/dL (ref 8–23)
CO2: 25 mmol/L (ref 22–32)
Calcium: 8.8 mg/dL — ABNORMAL LOW (ref 8.9–10.3)
Chloride: 97 mmol/L — ABNORMAL LOW (ref 98–111)
Creatinine, Ser: 0.52 mg/dL (ref 0.44–1.00)
GFR, Estimated: >60 mL/min (ref >60)
Glucose, Bld: 171 mg/dL — ABNORMAL HIGH (ref 70–99)
Potassium: 3.4 mmol/L — ABNORMAL LOW (ref 3.5–5.1)
Sodium: 134 mmol/L — ABNORMAL LOW (ref 135–145)
Total Bilirubin: 0.7 mg/dL (ref 0.3–1.2)
Total Protein: 7.7 g/dL (ref 6.5–8.1)

## 2020-10-31 LAB — CBC
HCT: 40.3 % (ref 36.0–46.0)
HCT: 37.8 % (ref 36.0–46.0)
Hemoglobin: 13.1 g/dL (ref 12.0–15.0)
Hemoglobin: 12.3 g/dL (ref 12.0–15.0)
MCH: 27.8 pg (ref 26.0–34.0)
MCH: 28.5 pg (ref 26.0–34.0)
MCHC: 32.5 g/dL (ref 30.0–36.0)
MCHC: 32.5 g/dL (ref 30.0–36.0)
MCV: 85.6 fL (ref 80.0–100.0)
MCV: 87.5 fL (ref 80.0–100.0)
Platelets: 409 10*3/uL — ABNORMAL HIGH (ref 150–400)
Platelets: 350 10*3/uL (ref 150–400)
RBC: 4.71 MIL/uL (ref 3.87–5.11)
RBC: 4.32 MIL/uL (ref 3.87–5.11)
RDW: 15.9 % — ABNORMAL HIGH (ref 11.5–15.5)
RDW: 15.9 % — ABNORMAL HIGH (ref 11.5–15.5)
WBC: 9.7 10*3/uL (ref 4.0–10.5)
WBC: 9.8 10*3/uL (ref 4.0–10.5)
nRBC: 0 % (ref 0.0–0.2)
nRBC: 0 % (ref 0.0–0.2)

## 2020-10-31 LAB — PROTIME-INR
INR: 1.4 — ABNORMAL HIGH (ref 0.8–1.2)
Prothrombin Time: 17.1 seconds — ABNORMAL HIGH (ref 11.4–15.2)

## 2020-10-31 MED ORDER — SODIUM CHLORIDE 0.9 % IV SOLN
1.0000 g | INTRAVENOUS | Status: DC
  Administered 2020-10-31 – 2020-11-01 (×2): 1 g via INTRAVENOUS
  Filled 2020-10-31 (×3): qty 10

## 2020-10-31 MED ORDER — SODIUM CHLORIDE 0.9 % IV SOLN
8.0000 mg/h | INTRAVENOUS | Status: DC
  Administered 2020-10-31 – 2020-11-01 (×3): 8 mg/h via INTRAVENOUS
  Filled 2020-10-31 (×4): qty 80

## 2020-10-31 MED ORDER — LABETALOL HCL 5 MG/ML IV SOLN: 5.0000 mg | INTRAVENOUS | Status: DC | PRN

## 2020-10-31 MED ORDER — PANTOPRAZOLE SODIUM 40 MG IV SOLR: 40.0000 mg | Freq: Two times a day (BID) | INTRAVENOUS | Status: DC

## 2020-10-31 MED ORDER — SODIUM CHLORIDE 0.9 % IV SOLN
80.0000 mg | Freq: Once | INTRAVENOUS | Status: AC
  Administered 2020-10-31: 80 mg via INTRAVENOUS
  Filled 2020-10-31: qty 80

## 2020-10-31 MED ORDER — ACETAMINOPHEN 650 MG RE SUPP: 650.0000 mg | Freq: Four times a day (QID) | RECTAL | Status: DC | PRN

## 2020-10-31 MED ORDER — ACETAMINOPHEN 325 MG PO TABS
650.0000 mg | ORAL_TABLET | Freq: Four times a day (QID) | ORAL | Status: DC | PRN
  Administered 2020-11-01: 650 mg via ORAL
  Filled 2020-10-31: qty 2

## 2020-10-31 NOTE — ED Triage Notes (Addendum)
Pt to ER via [facility] from [facility] after 3 witnessed episodes of coffee ground emesis that started approx 1hr ago. Facility report normal stools. Pt also endorses pain all over.   Pt takes eliquis. Coffee ground emesis present in bag and on patient gown.   EMS VSS. BP 196/108.  Pt HOH.

## 2020-10-31 NOTE — ED Notes (Signed)
Called son and update given of new IV to R arm and Covid +

## 2020-10-31 NOTE — ED Provider Notes (Signed)
[facility] Emergency Department Provider Note    ____________________________________________   I have reviewed the triage vital signs and the nursing notes.   HISTORY  Chief Complaint GI Problem   History limited by: Not Limited. Primary historian is son at bedside.    HPI [name] is a 87 y.o. female who presents to the emergency department today because of concerns for possible GI bleed.  The patient had multiple episodes of black emesis today.  The patient has not been complaining of any abdominal pain.  She does have a history of recent UTI which resulted in a fall pelvic fracture and right lower extremity DVT.  Due to the DVT the patient is on blood thinning medication.  Son states that the patient has a history of GI bleed in the past and has been on Protonix for that.  Has had endoscopy performed in the past without obvious source of the bleeding found.  Records reviewed. Per medical record review patient has a history of anemia, recent dvt.  Past Medical History:  Diagnosis Date  . Allergy   . Arthritis    back  . Benign neoplasm of skin, site unspecified   . Bowel trouble   . Breast cancer (HCC)   . Cancer (HCC)    colon 20 years ago  . Cancer of breast [facility]) April 10,.2013   Left breast, 3.8 cm, 4 cm axillary node, T2, N1a, ER negative PR negative, HER-2/neu not amplified.  . Cataract   . Colon cancer (HCC)   . Hard of hearing   . Heartburn   . Hiatal hernia   . History of stomach ulcers   . History of stomach ulcers   . Hypertension 2012  . Personal history of malignant neoplasm of breast 2013   LEFT MASTECTOMY,left modified radical mastectomy on September 06, 2011 483.8 cm primary tumor with a 4.6 cm axillary metastasis. 3 of 13 nodes were positive. T2,N1a lesion. This was a triple negative lesion  . Shingles Dec 2015  . Vaginal atrophy 08/10/2015  . Wrist fracture, right     Patient Active Problem List   Diagnosis Date Noted   . Severe sepsis with septic shock (HCC) 08/21/2020  . Depression 08/21/2020  . Acute metabolic encephalopathy 08/21/2020  . Fall at home, initial encounter 08/21/2020  . Pelvic ring fracture (HCC) 08/21/2020  . Hypokalemia 08/21/2020  . Hyperglycemia 08/21/2020  . Functional diarrhea 07/29/2019  . Open wound of left knee, leg, and ankle with complication 12/04/2018  . Chest wall pain 10/08/2017  . History of recurrent urinary tract infection 02/03/2017  . Vaginal pessary in situ 02/03/2017  . Iron deficiency anemia due to chronic blood loss 12/01/2016  . Personal history of malignant neoplasm of breast 11/09/2016  . GI bleed 08/21/2016  . Hereditary and idiopathic peripheral neuropathy 09/09/2015  . Malignant neoplasm of left female breast (HCC) 09/09/2015  . Prolapse of vaginal wall with midline cystocele 08/10/2015  . Incomplete bladder emptying 08/10/2015  . Midline low back pain without sciatica 08/10/2015  . Fecal incontinence 08/10/2015  . Recurrent UTI 07/19/2015  . Atrophic vaginitis 07/19/2015  . Adenomatous colon polyp 02/24/2015  . Anemia 02/24/2015  . Chronic sinusitis 02/24/2015  . Diverticulosis 02/24/2015  . Esophagitis 02/24/2015  . GERD (gastroesophageal reflux disease) 02/24/2015  . Hiatal hernia 02/24/2015  . Breast cancer, left breast (HCC) 09/05/2012  . Hypertension     Past Surgical History:  Procedure Laterality Date  . ABDOMINAL HYSTERECTOMY  42 YEARS AGO  . BREAST SURGERY Left 09-06-11   left modified radical mastectomy on September 06, 2011 483.8 cm primary tumor with a 4.6 cm axillary metastasis. 3 of 13 nodes were positive. T2,N1a lesion. This was a triple negative lesion  . CARPAL TUNNEL RELEASE Right September 25, 2014   Skip Estimable, M.D.  . COLON RESECTION  1985  . COLON SURGERY  20 YEARS AGO  . COLONOSCOPY  2012   DR. ELLIOTT  . COLONOSCOPY WITH PROPOFOL N/A 08/25/2016   Procedure: COLONOSCOPY WITH PROPOFOL;  Surgeon: [name], MD;   Location: [facility] ENDOSCOPY;  Service: Gastroenterology;  Laterality: N/A;  . ESOPHAGOGASTRODUODENOSCOPY (EGD) WITH PROPOFOL N/A 08/23/2016   Procedure: ESOPHAGOGASTRODUODENOSCOPY (EGD) WITH PROPOFOL;  Surgeon: [name], MD;  Location: [facility] ENDOSCOPY;  Service: Endoscopy;  Laterality: N/A;  . MASTECTOMY Left 2013   left modified radical mastectomy on September 06, 2011 Stage 2; 3.8 cm primary tumor with a 4.6 cm axillary metastasis. 3 of 13 nodes were positive. T2,N1a lesion. This was a triple negative lesion  . UPPER GI ENDOSCOPY  2012   BLEEDING    Prior to Admission medications   Medication Sig Start Date End Date Taking? Authorizing Provider  apixaban (ELIQUIS) 5 MG TABS tablet 10 mg BID for 3 more days then switch to 5 mg BID 08/30/20   Caren Griffins, MD  Ascorbic Acid (VITAMIN C) 1000 MG tablet Take 1,000 mg by mouth daily.    [provider]  cephALEXin (KEFLEX) 250 MG capsule Take 1 capsule (250 mg total) by mouth 3 (three) times daily for 10 days. 10/23/20 11/02/20  Harvest Dark, MD  Cholecalciferol (VITAMIN D) 50 MCG (2000 UT) CAPS Take 1 capsule by mouth daily.    [provider]  Cranberry 500 MG CAPS Take by mouth daily.    [provider]  DULoxetine (CYMBALTA) 20 MG capsule Take 20 mg by mouth daily.    [provider]  furosemide (LASIX) 20 MG tablet Take 1 tablet (20 mg total) by mouth daily for 3 days. 08/31/20 09/03/20  Caren Griffins, MD  Lactobacillus Rhamnosus, GG, (PROBIOTIC COLIC PO) Take 1 Dose by mouth daily. doterra brands    [provider]  loperamide (IMODIUM A-D) 2 MG tablet Take 2 mg by mouth as needed for diarrhea or loose stools.    [provider]  Melatonin 5 MG CAPS Take 1 capsule by mouth.     [provider]  metoprolol succinate (TOPROL-XL) 25 MG 24 hr tablet Take 25 mg by mouth daily.    [provider]  pantoprazole (PROTONIX) 40 MG tablet TAKE 1 TABLET BY MOUTH ONCE DAILY Patient  taking differently: Take 40 mg by mouth daily. 02/22/18   [name], MD  Potassium 99 MG TABS Take 1 tablet by mouth daily.    [provider]  vitamin B-12 (CYANOCOBALAMIN) 1000 MCG tablet Take 1,000 mcg by mouth daily.    [provider]    Allergies Codeine  Family History  Problem Relation Age of Onset  . Lung cancer Brother        colono ca  . Cancer Brother   . Skin cancer Daughter   . Leukemia Father   . Cancer Father   . Colon cancer Mother   . Cancer Mother   . Skin cancer Son   . Kidney disease Neg Hx   . Bladder Cancer Neg Hx     Social History Social History   Tobacco  Use  . Smoking status: Never Smoker  . Smokeless tobacco: Never Used  Vaping Use  . Vaping Use: Never used  Substance Use Topics  . Alcohol use: No  . Drug use: No    Review of Systems Constitutional: No fever/chills Eyes: No visual changes. ENT: No sore throat. Cardiovascular: Denies chest pain. Respiratory: Denies shortness of breath. Gastrointestinal: Positive for black emesis.  Genitourinary: Negative for dysuria. Musculoskeletal: Negative for back pain. Skin: Negative for rash. Neurological: Negative for headaches, focal weakness or numbness.  ____________________________________________   PHYSICAL EXAM:  VITAL SIGNS: ED Triage Vitals  Enc Vitals Group     BP 10/31/20 1742 (!) 161/86     Pulse Rate 10/31/20 1742 85     Resp 10/31/20 1742 18     Temp 10/31/20 1742 98.4 F (36.9 C)     Temp Source 10/31/20 1742 Oral     SpO2 10/31/20 1742 93 %     Weight 10/31/20 1738 150 lb (68 kg)     Height 10/31/20 1738 $RemoveBefor'5\' 3"'[id number]$  (1.6 m)     Head Circumference --      Peak Flow --      Pain Score 10/31/20 1738 4    Constitutional: Awake and alert. Eyes: Conjunctivae are normal.  ENT      Head: Normocephalic and atraumatic.      Nose: No congestion/rhinnorhea.      Mouth/Throat: Mucous membranes are moist.      Neck: No  stridor. Hematological/Lymphatic/Immunilogical: No cervical lymphadenopathy. Cardiovascular: Normal rate, regular rhythm.  No murmurs, rubs, or gallops.  Respiratory: Normal respiratory effort without tachypnea nor retractions. Breath sounds are clear and equal bilaterally. No wheezes/rales/rhonchi. Gastrointestinal: Soft and non tender. No rebound. No guarding.  Genitourinary: Deferred Musculoskeletal: Normal range of motion in all extremities. No lower extremity edema. Neurologic:  Normal speech and language. No gross focal neurologic deficits are appreciated.  Skin:  Skin is warm, dry and intact. No rash noted. Psychiatric: Mood and affect are normal. Speech and behavior are normal. Patient exhibits appropriate insight and judgment.  ____________________________________________    LABS (pertinent positives/negatives)  INR 1.4 CMP na 134, k 3.4, glu 171, cr 0.52 CBC wbc 9.7, hgb 13.1, plt 409  ____________________________________________   EKG  I, Nance Pear, attending physician, personally viewed and interpreted this EKG  EKG Time: 1740 Rate: 84 Rhythm: sinus rhythm Axis: normal Intervals: qtc 501 QRS: RBBB, LPFB ST changes: no st elevation Impression: abnormal ekg  ____________________________________________    RADIOLOGY  None  ____________________________________________   PROCEDURES  Procedures  ____________________________________________   INITIAL IMPRESSION / ASSESSMENT AND PLAN / ED COURSE  Pertinent labs & imaging results that were available during my care of the patient were reviewed by me and considered in my medical decision making (see chart for details).   Patient presents to the emergency department today because of concerns for upper GI bleed.  Patient without any vomiting during the time my exam although I did see a photo of the emesis that the sons had and it was black in color.  Given history of previous GI bleeds will start on  Protonix.  Patient is neither tachycardic nor hypotensive to suggest significant acute blood loss at this time.  Initial hemoglobin within normal limits.  Will plan on admission for further work-up and management.  ___________________________________________   FINAL CLINICAL IMPRESSION(S) / ED DIAGNOSES  Final diagnoses:  Gastrointestinal hemorrhage, unspecified gastrointestinal hemorrhage type     Note: This dictation was prepared  with Sales executive. Any transcriptional errors that result from this process are unintentional     Nance Pear, MD 10/31/20 [phone]

## 2020-10-31 NOTE — H&P (Addendum)
History and Physical    [name] ENI:[id number] DOB: 08-08-1920 DOA: 10/31/2020  PCP: [name], MD   Patient coming from: Skilled nursing facility.  Chief Complaint: Throwing up dark fluid.  Most of the history was obtained from patient's son and previous charts.  HPI: [name] is a 87 y.o. female with history of peptic ulcer disease diagnosed in 2012 in the setting of acute GI bleed as per patient's son who was recently admitted and March 2022 for sepsis at the time patient was diagnosed with possible pulmonary embolism and age indeterminant right lower extremity DVT was placed on Eliquis started throwing up dark-colored fluid at least 3-4 times since lunchtime.  Prior to that patient had a normal meal.  Did not complain of any abdominal pain.  Patient was recently placed on cephalexin for UTI and urine cultures grew E. coli sensitive to ceftriaxone.  Had taken 7 days of antibiotics so far.  ED Course: In the ER patient was hemodynamically stable.  Patient's son has brought a photograph with the picture of vomitus which is appearing dark color.  In the ER patient's hemoglobin is around 13.1 last 1 was around 12 platelets 409 LFTs were largely unremarkable.  Patient admitted for possible acute GI bleeding in the setting of taking Eliquis with prior history of peptic ulcer disease.  Review of Systems: As per HPI, rest all negative.   Past Medical History:  Diagnosis Date  . Allergy   . Arthritis    back  . Benign neoplasm of skin, site unspecified   . Bowel trouble   . Breast cancer ([place])   . Cancer ([place])    colon 20 years ago  . Cancer of breast [facility]) April 10,.2013   Left breast, 3.8 cm, 4 cm axillary node, T2, N1a, ER negative PR negative, HER-2/neu not amplified.  . Cataract   . Colon cancer ([place])   . Hard of hearing   . Heartburn   . Hiatal hernia   . History of stomach ulcers   . History of stomach ulcers   . Hypertension 2012  . Personal history of  malignant neoplasm of breast 2013   LEFT MASTECTOMY,left modified radical mastectomy on September 06, 2011 483.8 cm primary tumor with a 4.6 cm axillary metastasis. 3 of 13 nodes were positive. T2,N1a lesion. This was a triple negative lesion  . Shingles Dec 2015  . Vaginal atrophy 08/10/2015  . Wrist fracture, right     Past Surgical History:  Procedure Laterality Date  . ABDOMINAL HYSTERECTOMY     42 YEARS AGO  . BREAST SURGERY Left 09-06-11   left modified radical mastectomy on September 06, 2011 483.8 cm primary tumor with a 4.6 cm axillary metastasis. 3 of 13 nodes were positive. T2,N1a lesion. This was a triple negative lesion  . CARPAL TUNNEL RELEASE Right September 25, 2014   Skip Estimable, M.D.  . COLON RESECTION  1985  . COLON SURGERY  20 YEARS AGO  . COLONOSCOPY  2012   DR. ELLIOTT  . COLONOSCOPY WITH PROPOFOL N/A 08/25/2016   Procedure: COLONOSCOPY WITH PROPOFOL;  Surgeon: [name], MD;  Location: [facility] ENDOSCOPY;  Service: Gastroenterology;  Laterality: N/A;  . ESOPHAGOGASTRODUODENOSCOPY (EGD) WITH PROPOFOL N/A 08/23/2016   Procedure: ESOPHAGOGASTRODUODENOSCOPY (EGD) WITH PROPOFOL;  Surgeon: [name], MD;  Location: [facility] ENDOSCOPY;  Service: Endoscopy;  Laterality: N/A;  . MASTECTOMY Left 2013   left modified radical mastectomy on September 06, 2011 Stage 2; 3.8 cm primary tumor  with a 4.6 cm axillary metastasis. 3 of 13 nodes were positive. T2,N1a lesion. This was a triple negative lesion  . UPPER GI ENDOSCOPY  2012   BLEEDING     reports that she has never smoked. She has never used smokeless tobacco. She reports that she does not drink alcohol and does not use drugs.  Allergies  Allergen Reactions  . Codeine Nausea And Vomiting    Family History  Problem Relation Age of Onset  . Lung cancer Brother        colono ca  . Cancer Brother   . Skin cancer Daughter   . Leukemia Father   . Cancer Father   . Colon cancer Mother   . Cancer Mother   . Skin cancer Son   . Kidney  disease Neg Hx   . Bladder Cancer Neg Hx     Prior to Admission medications   Medication Sig Start Date End Date Taking? Authorizing Provider  apixaban (ELIQUIS) 5 MG TABS tablet 10 mg BID for 3 more days then switch to 5 mg BID 08/30/20   Caren Griffins, MD  Ascorbic Acid (VITAMIN C) 1000 MG tablet Take 1,000 mg by mouth daily.    [provider]  cephALEXin (KEFLEX) 250 MG capsule Take 1 capsule (250 mg total) by mouth 3 (three) times daily for 10 days. 10/23/20 11/02/20  Harvest Dark, MD  Cholecalciferol (VITAMIN D) 50 MCG (2000 UT) CAPS Take 1 capsule by mouth daily.    [provider]  Cranberry 500 MG CAPS Take by mouth daily.    [provider]  DULoxetine (CYMBALTA) 20 MG capsule Take 20 mg by mouth daily.    [provider]  furosemide (LASIX) 20 MG tablet Take 1 tablet (20 mg total) by mouth daily for 3 days. 08/31/20 09/03/20  Caren Griffins, MD  Lactobacillus Rhamnosus, GG, (PROBIOTIC COLIC PO) Take 1 Dose by mouth daily. doterra brands    [provider]  loperamide (IMODIUM A-D) 2 MG tablet Take 2 mg by mouth as needed for diarrhea or loose stools.    [provider]  Melatonin 5 MG CAPS Take 1 capsule by mouth.     [provider]  metoprolol succinate (TOPROL-XL) 25 MG 24 hr tablet Take 25 mg by mouth daily.    [provider]  pantoprazole (PROTONIX) 40 MG tablet TAKE 1 TABLET BY MOUTH ONCE DAILY Patient taking differently: Take 40 mg by mouth daily. 02/22/18   [name], MD  Potassium 99 MG TABS Take 1 tablet by mouth daily.    [provider]  vitamin B-12 (CYANOCOBALAMIN) 1000 MCG tablet Take 1,000 mcg by mouth daily.    [provider]    Physical Exam: Constitutional: Moderately built and nourished. Vitals:   10/31/20 1738 10/31/20 1742 10/31/20 1800 10/31/20 1830  BP:  (!) 161/86 (!) 157/72 (!) 150/70  Pulse:  85 78 81  Resp:  18 (!) 28 (!) 33  Temp:  98.4 F (36.9 C)     TempSrc:  Oral    SpO2:  93% 100% 95%  Weight: 68 kg     Height: 5' 3" (1.6 m)      Eyes: Anicteric no pallor. ENMT: No discharge from the ears eyes nose or mouth. Neck: No mass felt.  No neck rigidity. Respiratory: No rhonchi or crepitations. Cardiovascular: S1-S2 heard. Abdomen: Soft nontender bowel sounds present. Musculoskeletal: No edema. Skin: No rash. Neurologic: Alert awake oriented to name and  place moving all extremities. Psychiatric: Oriented to her name and place.   Labs on Admission: I have personally reviewed following labs and imaging studies  CBC: Recent Labs  Lab 10/31/20 1742  WBC 9.7  HGB 13.1  HCT 40.3  MCV 85.6  PLT 295*   Basic Metabolic Panel: Recent Labs  Lab 10/31/20 1742  NA 134*  K 3.4*  CL 97*  CO2 25  GLUCOSE 171*  BUN 12  CREATININE 0.52  CALCIUM 8.8*   GFR: Estimated Creatinine Clearance: 37.2 mL/min (by C-G formula based on SCr of 0.52 mg/dL). Liver Function Tests: Recent Labs  Lab 10/31/20 1742  AST 13*  ALT 10  ALKPHOS 88  BILITOT 0.7  PROT 7.7  ALBUMIN 3.4*   No results for input(s): LIPASE, AMYLASE in the last 168 hours. No results for input(s): AMMONIA in the last 168 hours. Coagulation Profile: Recent Labs  Lab 10/31/20 1742  INR 1.4*   Cardiac Enzymes: No results for input(s): CKTOTAL, CKMB, CKMBINDEX, TROPONINI in the last 168 hours. BNP (last 3 results) No results for input(s): PROBNP in the last 8760 hours. HbA1C: No results for input(s): HGBA1C in the last 72 hours. CBG: No results for input(s): GLUCAP in the last 168 hours. Lipid Profile: No results for input(s): CHOL, HDL, LDLCALC, TRIG, CHOLHDL, LDLDIRECT in the last 72 hours. Thyroid Function Tests: No results for input(s): TSH, T4TOTAL, FREET4, T3FREE, THYROIDAB in the last 72 hours. Anemia Panel: No results for input(s): VITAMINB12, FOLATE, FERRITIN, TIBC, IRON, RETICCTPCT in the last 72 hours. Urine analysis:    Component Value Date/Time    COLORURINE YELLOW (A) 10/23/2020 1818   APPEARANCEUR HAZY (A) 10/23/2020 1818   APPEARANCEUR Cloudy (A) 07/19/2015 1055   LABSPEC 1.009 10/23/2020 1818   LABSPEC 1.008 01/06/2012 1922   PHURINE 5.0 10/23/2020 1818   GLUCOSEU NEGATIVE 10/23/2020 1818   GLUCOSEU 150 mg/dL 01/06/2012 1922   HGBUR NEGATIVE 10/23/2020 1818   BILIRUBINUR NEGATIVE 10/23/2020 1818   BILIRUBINUR neg 08/04/2020 1446   BILIRUBINUR Negative 07/19/2015 1055   BILIRUBINUR Negative 01/06/2012 [place] 10/23/2020 [place] 10/23/2020 1818   UROBILINOGEN 0.2 08/04/2020 1446   NITRITE NEGATIVE 10/23/2020 1818   LEUKOCYTESUR SMALL (A) 10/23/2020 1818   LEUKOCYTESUR Trace 01/06/2012 1922   Sepsis Labs: _0 (procalcitonin:4,lacticidven:4) ) Recent Results (from the past 240 hour(s))  Urine Culture     Status: Abnormal   Collection Time: 10/23/20  6:18 PM   Specimen: Urine, Random  Result Value Ref Range Status   Specimen Description   Final    URINE, RANDOM Performed at [facility], [address], [place], [place] [address]    Special Requests   Final    NONE Performed at [facility], [address] [place], [place] [address]    Culture >=100,000 COLONIES/mL ESCHERICHIA COLI (A)  Final   Report Status 10/26/2020 FINAL  Final   Organism ID, Bacteria ESCHERICHIA COLI (A)  Final      Susceptibility   Escherichia coli - MIC*    AMPICILLIN >=32 RESISTANT Resistant     CEFAZOLIN 8 SENSITIVE Sensitive     CEFEPIME <=0.12 SENSITIVE Sensitive     CEFTRIAXONE <=0.25 SENSITIVE Sensitive     CIPROFLOXACIN <=0.25 SENSITIVE Sensitive     GENTAMICIN <=1 SENSITIVE Sensitive     IMIPENEM <=0.25 SENSITIVE Sensitive     NITROFURANTOIN <=16 SENSITIVE Sensitive     TRIMETH/SULFA <=20 SENSITIVE Sensitive     AMPICILLIN/SULBACTAM >=32  RESISTANT Resistant     PIP/TAZO <=4 SENSITIVE Sensitive     * >=100,000 COLONIES/mL ESCHERICHIA COLI  Resp Panel by RT-PCR  (Flu A&B, Covid) Nasopharyngeal Swab     Status: None   Collection Time: 10/23/20  6:25 PM   Specimen: Nasopharyngeal Swab; Nasopharyngeal(NP) swabs in vial transport medium  Result Value Ref Range Status   SARS Coronavirus 2 by RT PCR NEGATIVE NEGATIVE Final    Comment: (NOTE) SARS-CoV-2 target nucleic acids are NOT DETECTED.  The SARS-CoV-2 RNA is generally detectable in upper respiratory specimens during the acute phase of infection. The lowest concentration of SARS-CoV-2 viral copies this assay can detect is 138 copies/mL. A negative result does not preclude SARS-Cov-2 infection and should not be used as the sole basis for treatment or other patient management decisions. A negative result may occur with  improper specimen collection/handling, submission of specimen other than nasopharyngeal swab, presence of viral mutation(s) within the areas targeted by this assay, and inadequate number of viral copies(<138 copies/mL). A negative result must be combined with clinical observations, patient history, and epidemiological information. The expected result is Negative.  Fact Sheet for Patients:  EntrepreneurPulse.com.au  Fact Sheet for Healthcare Providers:  IncredibleEmployment.be  This test is no t yet approved or cleared by the [place] FDA and  has been authorized for detection and/or diagnosis of SARS-CoV-2 by FDA under an Emergency Use Authorization (EUA). This EUA will remain  in effect (meaning this test can be used) for the duration of the COVID-19 declaration under Section 564(b)(1) of the Act, 21 U.S.C.section 360bbb-3(b)(1), unless the authorization is terminated  or revoked sooner.       Influenza A by PCR NEGATIVE NEGATIVE Final   Influenza B by PCR NEGATIVE NEGATIVE Final    Comment: (NOTE) The Xpert Xpress SARS-CoV-2/FLU/RSV plus assay is intended as an aid in the diagnosis of influenza from Nasopharyngeal swab specimens  and should not be used as a sole basis for treatment. Nasal washings and aspirates are unacceptable for Xpert Xpress SARS-CoV-2/FLU/RSV testing.  Fact Sheet for Patients: EntrepreneurPulse.com.au  Fact Sheet for Healthcare Providers: IncredibleEmployment.be  This test is not yet approved or cleared by the [place] FDA and has been authorized for detection and/or diagnosis of SARS-CoV-2 by FDA under an Emergency Use Authorization (EUA). This EUA will remain in effect (meaning this test can be used) for the duration of the COVID-19 declaration under Section 564(b)(1) of the Act, 21 U.S.C. section 360bbb-3(b)(1), unless the authorization is terminated or revoked.  Performed at [facility], [address], [place], [place] [address]      Radiological Exams on Admission: No results found.  EKG: Independently reviewed.  Normal sinus rhythm RBBB.  Assessment/Plan Principal Problem:   Acute GI bleeding Active Problems:   Hypertension    1. Possible acute GI bleed in the setting of Eliquis with prior history of peptic ulcer disease last EGD done in 2018 showed reflux esophagitis.  Colonoscopy done at that time showed diverticulosis and also internal hemorrhoids.  Will hold Eliquis, patient's son is in agreement for that.  We will check serial CBCs keep patient n.p.o. continue IV Protonix infusion.  Consult patient's gastroenterologist Dr. [name]. 2. Hypertension we will keep patient on as needed IV labetalol since patient is n.p.o. 3. Recent diagnosis of right lower extremity DVT and possible PE on Eliquis which is on hold see #1. 4. History of anemia follow CBC. 5. Mild hypokalemia replace and recheck. 6. Recently diagnosed UTI  cultures grew E. coli.  Per patient's son patient has 3 more days of antibiotics left for which I have placed patient on IV ceftriaxone.  Cultures are sensitive to ceftriaxone. 7. History of CHF last 2D  echo done in March 2020 was 60 to 65% but appears compensated.  Since patient has possible acute GI bleeding in the setting of Eliquis will need close monitoring and inpatient status.  Addendum -patient's COVID test came back positive.  Patient is presently asymptomatic with no shortness of breath or hypoxia.  We will get chest x-ray check inflammatory markers and for now we will keep patient on remdesivir.  Closely observe for any hypoxia or shortness of breath.   DVT prophylaxis: Avoiding anticoagulation since patient has acute GI bleeding.  Avoiding SCDs due to recent DVT. Code Status: DNR confirmed with patient's son. Family Communication: Patient's son. Disposition Plan: Back to rehab when stable. Consults called: Copywriter, advertising. Admission status: Inpatient.   Rise Patience MD [facility] Pager [phone].  If 7PM-7AM, please contact night-coverage [email or web address] Password [facility]  10/31/2020, 7:43 PM

## 2020-11-01 ENCOUNTER — Inpatient Hospital Stay: Payer: Medicare Other

## 2020-11-01 DIAGNOSIS — I10 Essential (primary) hypertension: Secondary | ICD-10-CM

## 2020-11-01 DIAGNOSIS — Z86711 Personal history of pulmonary embolism: Secondary | ICD-10-CM | POA: Diagnosis present

## 2020-11-01 DIAGNOSIS — U071 COVID-19: Secondary | ICD-10-CM | POA: Diagnosis present

## 2020-11-01 DIAGNOSIS — F32A Depression, unspecified: Secondary | ICD-10-CM

## 2020-11-01 LAB — BASIC METABOLIC PANEL
Anion gap: 9 (ref 5–15)
BUN: 9 mg/dL (ref 8–23)
CO2: 26 mmol/L (ref 22–32)
Calcium: 8.3 mg/dL — ABNORMAL LOW (ref 8.9–10.3)
Chloride: 101 mmol/L (ref 98–111)
Creatinine, Ser: 0.55 mg/dL (ref 0.44–1.00)
GFR, Estimated: >60 mL/min (ref >60)
Glucose, Bld: 119 mg/dL — ABNORMAL HIGH (ref 70–99)
Potassium: 3.6 mmol/L (ref 3.5–5.1)
Sodium: 136 mmol/L (ref 135–145)

## 2020-11-01 LAB — CBC
HCT: 36.3 % (ref 36.0–46.0)
HCT: 38.4 % (ref 36.0–46.0)
Hemoglobin: 12 g/dL (ref 12.0–15.0)
Hemoglobin: 12.2 g/dL (ref 12.0–15.0)
MCH: 27.7 pg (ref 26.0–34.0)
MCH: 28.2 pg (ref 26.0–34.0)
MCHC: 31.8 g/dL (ref 30.0–36.0)
MCHC: 33.1 g/dL (ref 30.0–36.0)
MCV: 85.2 fL (ref 80.0–100.0)
MCV: 87.1 fL (ref 80.0–100.0)
Platelets: 349 10*3/uL (ref 150–400)
Platelets: 372 10*3/uL (ref 150–400)
RBC: 4.26 MIL/uL (ref 3.87–5.11)
RBC: 4.41 MIL/uL (ref 3.87–5.11)
RDW: 15.9 % — ABNORMAL HIGH (ref 11.5–15.5)
RDW: 16.1 % — ABNORMAL HIGH (ref 11.5–15.5)
WBC: 6.5 10*3/uL (ref 4.0–10.5)
WBC: 8.4 10*3/uL (ref 4.0–10.5)
nRBC: 0 % (ref 0.0–0.2)
nRBC: 0 % (ref 0.0–0.2)

## 2020-11-01 LAB — HEMOGLOBIN AND HEMATOCRIT, BLOOD
HCT: 38.9 % (ref 36.0–46.0)
Hemoglobin: 12.7 g/dL (ref 12.0–15.0)

## 2020-11-01 LAB — PROTIME-INR
INR: 1.3 — ABNORMAL HIGH (ref 0.8–1.2)
Prothrombin Time: 15.9 seconds — ABNORMAL HIGH (ref 11.4–15.2)

## 2020-11-01 LAB — D-DIMER, QUANTITATIVE: D-Dimer, Quant: 0.58 ug/mL-FEU — ABNORMAL HIGH (ref 0.00–0.50)

## 2020-11-01 LAB — GLUCOSE, CAPILLARY
Glucose-Capillary: 106 mg/dL — ABNORMAL HIGH (ref 70–99)
Glucose-Capillary: 116 mg/dL — ABNORMAL HIGH (ref 70–99)

## 2020-11-01 LAB — PROCALCITONIN: Procalcitonin: <0.1 ng/mL

## 2020-11-01 LAB — RESP PANEL BY RT-PCR (FLU A&B, COVID) ARPGX2
Influenza A by PCR: NEGATIVE
Influenza B by PCR: NEGATIVE
SARS Coronavirus 2 by RT PCR: POSITIVE — AB

## 2020-11-01 LAB — C-REACTIVE PROTEIN: CRP: 2.2 mg/dL — ABNORMAL HIGH (ref <1.0)

## 2020-11-01 MED ORDER — POTASSIUM CHLORIDE 10 MEQ/100ML IV SOLN
10.0000 meq | Freq: Once | INTRAVENOUS | Status: AC
  Administered 2020-11-01: 10 meq via INTRAVENOUS
  Filled 2020-11-01: qty 100

## 2020-11-01 MED ORDER — SODIUM CHLORIDE 0.9 % IV SOLN
200.0000 mg | Freq: Once | INTRAVENOUS | Status: AC
  Administered 2020-11-01: 200 mg via INTRAVENOUS
  Filled 2020-11-01: qty 200
  Filled 2020-11-01: qty 40

## 2020-11-01 MED ORDER — SODIUM CHLORIDE 0.9 % IV SOLN
100.0000 mg | Freq: Every day | INTRAVENOUS | Status: DC
  Administered 2020-11-02: 100 mg via INTRAVENOUS
  Filled 2020-11-01: qty 20
  Filled 2020-11-01: qty 100
  Filled 2020-11-01: qty 20

## 2020-11-01 NOTE — Progress Notes (Signed)
PROGRESS NOTE  [name] RCB:[id number] DOB: [birth year] DOA: 10/31/2020 PCP: [name], MD  HPI/Recap of past 24 hours: Patient is a 87 year old female with past medical history of GI bleed in 2012 who was hospitalized in March of this year for sepsis and at that time diagnosed with possible pulmonary embolus and age-indeterminate right lower extremity DVT and placed on Eliquis.  She was brought into the emergency room on 6/5 after throwing up dark-colored material 3-4 times prior to that patient had been eating normally and vomiting occurred without warning or pain.  In ED, hemoglobin stable at 12 and blood pressure stable/high without hypotension.  Pt found to incidentally have Covid.   By this morning, patient with no complaints.  Resting comfortably.  Assessment/Plan: Principal Problem:   Acute GI bleeding while on Eliquis for recent PE: Look stable.  In discussion with her son, they favor monitoring her for now and if remains stable without any major drop in hemoglobin, to resume Eliquis given stability of vitals and H/H, no need for intervention at this time.  Active Problems:   Hypertension: Stable.    Depression: Cont SSRI    History of pulmonary embolus (PE): See above.    COVID-19 virus infection: Incidental.  Pt not hypoxic.  CRP of 2.2.  For now, we will monitor  Code Status: Stable   Family Communication: Updated son in person  Disposition Plan: Home once cleared from Covid and GI standpoint    Consultants:  None   Procedures:  None   Antimicrobials:  None   DVT prophylaxis:  SCD's  Level of care: Med-Surg   Objective: Vitals:   11/01/20 0506 11/01/20 0859  BP: 139/65 (!) 144/73  Pulse: 77 78  Resp: 20 18  Temp: 98.2 F (36.8 C) 97.8 F (36.6 C)  SpO2: 100% 93%    Intake/Output Summary (Last 24 hours) at 11/01/2020 1026 Last data filed at 11/01/2020 0500 Gross per 24 hour  Intake --  Output 200 ml  Net -200 ml   Filed Weights    10/31/20 1738 10/31/20 2041  Weight: 68 kg 62.9 kg   Body mass index is 24.56 kg/m.  Exam:   General:   Resting comfortably, oriented x2  Cardiovascular: Regular rate and rhythm, S1-S2  Respiratory: Clear to auscultation bilaterally  Abdomen: Soft, nontender, nondistended, positive bowel sounds  Musculoskeletal: No clubbing or cyanosis or edema  Skin: No skin breaks, tears or lesions  Psychiatry: Appropriate, no evidence of psychoses  Neurology: No focal deficits   Data Reviewed: CBC: Recent Labs  Lab 10/31/20 1742 10/31/20 2046 10/31/20 2346 11/01/20 0826  WBC 9.7 9.8 8.4 6.5  HGB 13.1 12.3 12.2 12.0  HCT 40.3 37.8 38.4 36.3  MCV 85.6 87.5 87.1 85.2  PLT 409* 350 349 803   Basic Metabolic Panel: Recent Labs  Lab 10/31/20 1742 11/01/20 0434  NA 134* 136  K 3.4* 3.6  CL 97* 101  CO2 25 26  GLUCOSE 171* 119*  BUN 12 9  CREATININE 0.52 0.55  CALCIUM 8.8* 8.3*   GFR: Estimated Creatinine Clearance: 35.9 mL/min (by C-G formula based on SCr of 0.55 mg/dL). Liver Function Tests: Recent Labs  Lab 10/31/20 1742  AST 13*  ALT 10  ALKPHOS 88  BILITOT 0.7  PROT 7.7  ALBUMIN 3.4*   No results for input(s): LIPASE, AMYLASE in the last 168 hours. No results for input(s): AMMONIA in the last 168 hours. Coagulation Profile: Recent Labs  Lab 10/31/20 1742 11/01/20  0810  INR 1.4* 1.3*   Cardiac Enzymes: No results for input(s): CKTOTAL, CKMB, CKMBINDEX, TROPONINI in the last 168 hours. BNP (last 3 results) No results for input(s): PROBNP in the last 8760 hours. HbA1C: No results for input(s): HGBA1C in the last 72 hours. CBG: Recent Labs  Lab 10/31/20 2052 11/01/20 0751  GLUCAP 193* 106*   Lipid Profile: No results for input(s): CHOL, HDL, LDLCALC, TRIG, CHOLHDL, LDLDIRECT in the last 72 hours. Thyroid Function Tests: No results for input(s): TSH, T4TOTAL, FREET4, T3FREE, THYROIDAB in the last 72 hours. Anemia Panel: No results for input(s):  VITAMINB12, FOLATE, FERRITIN, TIBC, IRON, RETICCTPCT in the last 72 hours. Urine analysis:    Component Value Date/Time   COLORURINE YELLOW (A) 10/23/2020 1818   APPEARANCEUR HAZY (A) 10/23/2020 1818   APPEARANCEUR Cloudy (A) 07/19/2015 1055   LABSPEC 1.009 10/23/2020 1818   LABSPEC 1.008 01/06/2012 1922   PHURINE 5.0 10/23/2020 1818   GLUCOSEU NEGATIVE 10/23/2020 1818   GLUCOSEU 150 mg/dL 01/06/2012 1922   HGBUR NEGATIVE 10/23/2020 1818   BILIRUBINUR NEGATIVE 10/23/2020 1818   BILIRUBINUR neg 08/04/2020 1446   BILIRUBINUR Negative 07/19/2015 1055   BILIRUBINUR Negative 01/06/2012 [place] 10/23/2020 [place] 10/23/2020 1818   UROBILINOGEN 0.2 08/04/2020 1446   NITRITE NEGATIVE 10/23/2020 1818   LEUKOCYTESUR SMALL (A) 10/23/2020 1818   LEUKOCYTESUR Trace 01/06/2012 1922   Sepsis Labs: @LABRCNTIP (procalcitonin:4,lacticidven:4)  ) Recent Results (from the past 240 hour(s))  Urine Culture     Status: Abnormal   Collection Time: 10/23/20  6:18 PM   Specimen: Urine, Random  Result Value Ref Range Status   Specimen Description   Final    URINE, RANDOM Performed at [facility], [address], [place], [place] [address]    Special Requests   Final    NONE Performed at [facility], [address] [place], [place] [address]    Culture >=100,000 COLONIES/mL ESCHERICHIA COLI (A)  Final   Report Status 10/26/2020 FINAL  Final   Organism ID, Bacteria ESCHERICHIA COLI (A)  Final      Susceptibility   Escherichia coli - MIC*    AMPICILLIN >=32 RESISTANT Resistant     CEFAZOLIN 8 SENSITIVE Sensitive     CEFEPIME <=0.12 SENSITIVE Sensitive     CEFTRIAXONE <=0.25 SENSITIVE Sensitive     CIPROFLOXACIN <=0.25 SENSITIVE Sensitive     GENTAMICIN <=1 SENSITIVE Sensitive     IMIPENEM <=0.25 SENSITIVE Sensitive     NITROFURANTOIN <=16 SENSITIVE Sensitive     TRIMETH/SULFA <=20 SENSITIVE Sensitive     AMPICILLIN/SULBACTAM >=32  RESISTANT Resistant     PIP/TAZO <=4 SENSITIVE Sensitive     * >=100,000 COLONIES/mL ESCHERICHIA COLI  Resp Panel by RT-PCR (Flu A&B, Covid) Nasopharyngeal Swab     Status: None   Collection Time: 10/23/20  6:25 PM   Specimen: Nasopharyngeal Swab; Nasopharyngeal(NP) swabs in vial transport medium  Result Value Ref Range Status   SARS Coronavirus 2 by RT PCR NEGATIVE NEGATIVE Final    Comment: (NOTE) SARS-CoV-2 target nucleic acids are NOT DETECTED.  The SARS-CoV-2 RNA is generally detectable in upper respiratory specimens during the acute phase of infection. The lowest concentration of SARS-CoV-2 viral copies this assay can detect is 138 copies/mL. A negative result does not preclude SARS-Cov-2 infection and should not be used as the sole basis for treatment or other patient management decisions. A negative result may occur with  improper specimen collection/handling, submission of  specimen other than nasopharyngeal swab, presence of viral mutation(s) within the areas targeted by this assay, and inadequate number of viral copies(<138 copies/mL). A negative result must be combined with clinical observations, patient history, and epidemiological information. The expected result is Negative.  Fact Sheet for Patients:  EntrepreneurPulse.com.au  Fact Sheet for Healthcare Providers:  IncredibleEmployment.be  This test is no t yet approved or cleared by the [place] FDA and  has been authorized for detection and/or diagnosis of SARS-CoV-2 by FDA under an Emergency Use Authorization (EUA). This EUA will remain  in effect (meaning this test can be used) for the duration of the COVID-19 declaration under Section 564(b)(1) of the Act, 21 U.S.C.section 360bbb-3(b)(1), unless the authorization is terminated  or revoked sooner.       Influenza A by PCR NEGATIVE NEGATIVE Final   Influenza B by PCR NEGATIVE NEGATIVE Final    Comment: (NOTE) The Xpert  Xpress SARS-CoV-2/FLU/RSV plus assay is intended as an aid in the diagnosis of influenza from Nasopharyngeal swab specimens and should not be used as a sole basis for treatment. Nasal washings and aspirates are unacceptable for Xpert Xpress SARS-CoV-2/FLU/RSV testing.  Fact Sheet for Patients: EntrepreneurPulse.com.au  Fact Sheet for Healthcare Providers: IncredibleEmployment.be  This test is not yet approved or cleared by the [place] FDA and has been authorized for detection and/or diagnosis of SARS-CoV-2 by FDA under an Emergency Use Authorization (EUA). This EUA will remain in effect (meaning this test can be used) for the duration of the COVID-19 declaration under Section 564(b)(1) of the Act, 21 U.S.C. section 360bbb-3(b)(1), unless the authorization is terminated or revoked.  Performed at [facility], [address] [place], [place] [address]   Resp Panel by RT-PCR (Flu A&B, Covid) Nasopharyngeal Swab     Status: Abnormal   Collection Time: 10/31/20  6:26 PM   Specimen: Nasopharyngeal Swab; Nasopharyngeal(NP) swabs in vial transport medium  Result Value Ref Range Status   SARS Coronavirus 2 by RT PCR POSITIVE (A) NEGATIVE Corrected    Comment: RESULT CALLED TO, READ BACK BY AND VERIFIED WITH: JEN DALEY @1942  10/31/2020 [facility] (NOTE) SARS-CoV-2 target nucleic acids are DETECTED.  The SARS-CoV-2 RNA is generally detectable in upper respiratory specimens during the acute phase of infection. Positive results are indicative of the presence of the identified virus, but do not rule out bacterial infection or co-infection with other pathogens not detected by the test. Clinical correlation with patient history and other diagnostic information is necessary to determine patient infection status. The expected result is Negative.  Fact Sheet for Patients: EntrepreneurPulse.com.au  Fact Sheet for Healthcare  Providers: IncredibleEmployment.be  This test is not yet approved or cleared by the [place] FDA and  has been authorized for detection and/or diagnosis of SARS-CoV-2 by FDA under an Emergency Use Authorization (EUA).  This EUA will remain in effect (meaning this test can be [place] ed) for the duration of  the COVID-19 declaration under Section 564(b)(1) of the Act, 21 U.S.C. section 360bbb-3(b)(1), unless the authorization is terminated or revoked sooner.  CORRECTED ON 06/06 AT 0615: PREVIOUSLY REPORTED AS POSITIVE RESULT CALLED TO, READ BACK BY AND VERIFIED WITH: SHAQUITA HARRIS @1943  10/31/2020 [facility]    Influenza A by PCR NEGATIVE NEGATIVE Final   Influenza B by PCR NEGATIVE NEGATIVE Final    Comment: (NOTE) The Xpert Xpress SARS-CoV-2/FLU/RSV plus assay is intended as an aid in the diagnosis of influenza from Nasopharyngeal swab specimens and should not be used as a  sole basis for treatment. Nasal washings and aspirates are unacceptable for Xpert Xpress SARS-CoV-2/FLU/RSV testing.  Fact Sheet for Patients: EntrepreneurPulse.com.au  Fact Sheet for Healthcare Providers: IncredibleEmployment.be  This test is not yet approved or cleared by the [place] FDA and has been authorized for detection and/or diagnosis of SARS-CoV-2 by FDA under an Emergency Use Authorization (EUA). This EUA will remain in effect (meaning this test can be used) for the duration of the COVID-19 declaration under Section 564(b)(1) of the Act, 21 U.S.C. section 360bbb-3(b)(1), unless the authorization is terminated or revoked.  Performed at [facility], [address] [place], [place] [address]       Studies: [facility] Chest Grill 1 View  Result Date: 11/01/2020 CLINICAL DATA:  COVID-19 pneumonia EXAM: PORTABLE CHEST 1 VIEW COMPARISON:  08/31/2020 FINDINGS: Pulmonary insufflation is stable and symmetric. Interstitial thickening has developed  within the mid lung zones bilaterally with band like opacities noted within the right mid lung zone suggestive of parenchymal scarring. No superimposed confluent pulmonary infiltrate. No pneumothorax or pleural effusion. Moderate hiatal hernia. Cardiac size within normal limits. The pulmonary vascularity is normal. No acute bone abnormality. IMPRESSION: Interstitial changes suggestive of interstitial fibrosis. Preserved pulmonary insufflation. Moderate hiatal hernia. Electronically Signed   By: Fidela Salisbury MD   On: 11/01/2020 01:22    Scheduled Meds: . Derrill Memo ON 11/04/2020] pantoprazole  40 mg Intravenous Q12H    Continuous Infusions: . cefTRIAXone (ROCEPHIN)  IV Stopped (10/31/20 2036)  . pantoprozole (PROTONIX) infusion 8 mg/hr (11/01/20 0536)  . [START ON 11/02/2020] remdesivir 100 mg in NS 100 mL       LOS: 1 day     Annita Brod, MD [facility]   11/01/2020, 10:26 AM

## 2020-11-02 LAB — CBC
HCT: 37 % (ref 36.0–46.0)
Hemoglobin: 12 g/dL (ref 12.0–15.0)
MCH: 27.9 pg (ref 26.0–34.0)
MCHC: 32.4 g/dL (ref 30.0–36.0)
MCV: 86 fL (ref 80.0–100.0)
Platelets: 347 10*3/uL (ref 150–400)
RBC: 4.3 MIL/uL (ref 3.87–5.11)
RDW: 16.1 % — ABNORMAL HIGH (ref 11.5–15.5)
WBC: 5.3 10*3/uL (ref 4.0–10.5)
nRBC: 0 % (ref 0.0–0.2)

## 2020-11-02 LAB — TYPE AND SCREEN
ABO/RH(D): A POS
Antibody Screen: NEGATIVE
Unit division: 0
Unit division: 0

## 2020-11-02 LAB — BPAM RBC
Blood Product Expiration Date: 202206162359
Blood Product Expiration Date: 202206262359
Unit Type and Rh: 5100
Unit Type and Rh: 6200

## 2020-11-02 LAB — BASIC METABOLIC PANEL
Anion gap: 8 (ref 5–15)
BUN: 7 mg/dL — ABNORMAL LOW (ref 8–23)
CO2: 25 mmol/L (ref 22–32)
Calcium: 8.3 mg/dL — ABNORMAL LOW (ref 8.9–10.3)
Chloride: 100 mmol/L (ref 98–111)
Creatinine, Ser: 0.47 mg/dL (ref 0.44–1.00)
GFR, Estimated: >60 mL/min (ref >60)
Glucose, Bld: 96 mg/dL (ref 70–99)
Potassium: 3.3 mmol/L — ABNORMAL LOW (ref 3.5–5.1)
Sodium: 133 mmol/L — ABNORMAL LOW (ref 135–145)

## 2020-11-02 LAB — GLUCOSE, CAPILLARY
Glucose-Capillary: 100 mg/dL — ABNORMAL HIGH (ref 70–99)
Glucose-Capillary: 98 mg/dL (ref 70–99)

## 2020-11-02 MED ORDER — POTASSIUM CHLORIDE CRYS ER 20 MEQ PO TBCR
40.0000 meq | EXTENDED_RELEASE_TABLET | Freq: Once | ORAL | Status: AC
  Administered 2020-11-02: 40 meq via ORAL
  Filled 2020-11-02: qty 2

## 2020-11-02 MED ORDER — PANTOPRAZOLE SODIUM 40 MG PO TBEC
40.0000 mg | DELAYED_RELEASE_TABLET | Freq: Two times a day (BID) | ORAL | Status: DC
  Administered 2020-11-02: 40 mg via ORAL
  Filled 2020-11-02: qty 1

## 2020-11-02 MED ORDER — PANTOPRAZOLE SODIUM 40 MG PO TBEC: 1.0000 | DELAYED_RELEASE_TABLET | Freq: Two times a day (BID) | ORAL | 2 refills | Status: AC

## 2020-11-02 NOTE — Progress Notes (Addendum)
Patient transferred to [facility] & attempted for the third time to give report. POA aware of patient transfer.

## 2020-11-02 NOTE — NC FL2 (Signed)
[place] LEVEL OF CARE SCREENING TOOL     IDENTIFICATION  Patient Name: [name] Birthdate: 1920/08/14 Sex: female Admission Date (Current Location): 10/31/2020  [place] and [place] Number:  Engineering geologist and Address:  [facility], [address], [place], [place] [address]      Provider Number: [id number]  Attending Physician Name and Address:  Max Sane, MD  Relative Name and Phone Number:  Anibal Henderson [phone], Daughter and POA    Current Level of Care: Hospital Recommended Level of Care: [place] Prior Approval Number:    Date Approved/Denied:   PASRR Number: [id number]  Discharge Plan: SNF    Current Diagnoses: Patient Active Problem List   Diagnosis Date Noted  . History of pulmonary embolus (PE) 11/01/2020  . COVID-19 virus infection 11/01/2020  . Acute GI bleeding 10/31/2020  . Severe sepsis with septic shock ([place]) 08/21/2020  . Depression 08/21/2020  . Acute metabolic encephalopathy 60/02/9322  . Fall at home, initial encounter 08/21/2020  . Pelvic ring fracture ([place]) 08/21/2020  . Hypokalemia 08/21/2020  . Hyperglycemia 08/21/2020  . Functional diarrhea 07/29/2019  . Open wound of left knee, leg, and ankle with complication 55/73/2202  . Chest wall pain 10/08/2017  . History of recurrent urinary tract infection 02/03/2017  . Vaginal pessary in situ 02/03/2017  . Iron deficiency anemia due to chronic blood loss 12/01/2016  . Personal history of malignant neoplasm of breast 11/09/2016  . GI bleed 08/21/2016  . Hereditary and idiopathic peripheral neuropathy 09/09/2015  . Malignant neoplasm of left female breast ([place]) 09/09/2015  . Prolapse of vaginal wall with midline cystocele 08/10/2015  . Incomplete bladder emptying 08/10/2015  . Midline low back pain without sciatica 08/10/2015  . Fecal incontinence 08/10/2015  . Recurrent UTI 07/19/2015  . Atrophic vaginitis 07/19/2015  .  Adenomatous colon polyp 02/24/2015  . Anemia 02/24/2015  . Chronic sinusitis 02/24/2015  . Diverticulosis 02/24/2015  . Esophagitis 02/24/2015  . GERD (gastroesophageal reflux disease) 02/24/2015  . Hiatal hernia 02/24/2015  . Breast cancer, left breast ([place]) 09/05/2012  . Hypertension     Orientation RESPIRATION BLADDER Height & Weight     Self  Normal External catheter Weight: 62.9 kg Height:  5\' 3"  (160 cm)  BEHAVIORAL SYMPTOMS/MOOD NEUROLOGICAL BOWEL NUTRITION STATUS      Continent Diet  AMBULATORY STATUS COMMUNICATION OF NEEDS Skin   Extensive Assist Verbally Normal                       Personal Care Assistance Level of Assistance  Bathing,Feeding,Dressing Bathing Assistance: Limited assistance Feeding assistance: Limited assistance Dressing Assistance: Limited assistance     Functional Limitations Info  Sight,Hearing,Speech Sight Info: Adequate Hearing Info: Impaired (HARD OF HEARING) Speech Info: Adequate    SPECIAL CARE FACTORS FREQUENCY                       Contractures Contractures Info: Not present    Additional Factors Info  Code Status,Allergies,Isolation Precautions Code Status Info: DNR Allergies Info: Codeine     Isolation Precautions Info: COVID Positive     Current Medications (11/02/2020):  This is the current hospital active medication list Current Facility-Administered Medications  Medication Dose Route Frequency Provider Last Rate Last Admin  . acetaminophen (TYLENOL) tablet 650 mg  650 mg Oral Q6H PRN Rise Patience, MD   650 mg at 11/01/20 0243   Or  . acetaminophen (  TYLENOL) suppository 650 mg  650 mg Rectal Q6H PRN Rise Patience, MD      . cefTRIAXone (ROCEPHIN) 1 g in sodium chloride 0.9 % 100 mL IVPB  1 g Intravenous Q24H Rise Patience, MD 200 mL/hr at 11/01/20 2029 1 g at 11/01/20 2029  . labetalol (NORMODYNE) injection 5 mg  5 mg Intravenous Q2H PRN Rise Patience, MD      . pantoprazole  (PROTONIX) EC tablet 40 mg  40 mg Oral BID AC Max Sane, MD   40 mg at 11/02/20 0947  . remdesivir 100 mg in sodium chloride 0.9 % 100 mL IVPB  100 mg Intravenous Daily Rise Patience, MD 200 mL/hr at 11/02/20 0955 100 mg at 11/02/20 7414     Discharge Medications: Please see discharge summary for a list of discharge medications.  Relevant Imaging Results:  Relevant Lab Results:   Additional Information SS# [id number]  Kerin Salen, RN

## 2020-11-02 NOTE — Discharge Summary (Signed)
[name] at [place] NAME: [name]    MR#:  [id number]  DATE OF BIRTH:  [birth year]  DATE OF ADMISSION:  10/31/2020   ADMITTING PHYSICIAN: Sandra Patience, MD  DATE OF DISCHARGE: 11/02/2020  PRIMARY CARE PHYSICIAN: Sandra Pink, MD   ADMISSION DIAGNOSIS:  Acute GI bleeding [K92.2] Gastrointestinal hemorrhage, unspecified gastrointestinal hemorrhage type [K92.2] DISCHARGE DIAGNOSIS:  Principal Problem:   Acute GI bleeding Active Problems:   Hypertension   Depression   History of pulmonary embolus (PE)   COVID-19 virus infection  SECONDARY DIAGNOSIS:   Past Medical History:  Diagnosis Date  . Allergy   . Arthritis    back  . Benign neoplasm of skin, site unspecified   . Bowel trouble   . Breast cancer ([place])   . Cancer ([place])    colon 20 years ago  . Cancer of breast [facility]) April 10,.2013   Left breast, 3.8 cm, 4 cm axillary node, T2, N1a, ER negative PR negative, HER-2/neu not amplified.  . Cataract   . Colon cancer ([place])   . Hard of hearing   . Heartburn   . Hiatal hernia   . History of stomach ulcers   . History of stomach ulcers   . Hypertension 2012  . Personal history of malignant neoplasm of breast 2013   LEFT MASTECTOMY,left modified radical mastectomy on September 06, 2011 483.8 cm primary tumor with a 4.6 cm axillary metastasis. 3 of 13 nodes were positive. T2,N1a lesion. This was a triple negative lesion  . Shingles Dec 2015  . Vaginal atrophy 08/10/2015  . Wrist fracture, right    HOSPITAL COURSE:  87 year old female with past medical history of GI bleed in 2012 who was hospitalized in March of this year for sepsis and at that time diagnosed with possible pulmonary embolus and age-indeterminate right lower extremity DVT and placed on Eliquis.  She is admitted after throwing up dark-colored material 3-4 times prior to that patient had been eating normally and vomiting occurred without warning or pain.  Acute GI  bleeding while on Eliquis for recent PE: She remained hemodynamically stable throughout her hospitalization.  No further vomiting.  In discussion with family, they favor conservative management.  Patient did not have any further bleeding/vomiting and tolerated diet.  We will resume Eliquis at discharge.  No endoscopy was done while in the hospital.  She will need outpatient follow-up with Dr. Vicente Males.    Hypertension: Stable.    Depression: Cont SSRI    History of pulmonary embolus (PE): On Eliquis    COVID-19 virus infection: Incidental.  Pt not hypoxic.  CRP of 2.2.     DISCHARGE CONDITIONS:  Stable CONSULTS OBTAINED:   DRUG ALLERGIES:   Allergies  Allergen Reactions  . Codeine Nausea And Vomiting   DISCHARGE MEDICATIONS:   Allergies as of 11/02/2020      Reactions   Codeine Nausea And Vomiting      Medication List    TAKE these medications   apixaban 5 MG Tabs tablet Commonly known as: ELIQUIS 10 mg BID for 3 more days then switch to 5 mg BID   cephALEXin 250 MG capsule Commonly known as: KEFLEX Take 1 capsule (250 mg total) by mouth 3 (three) times daily for 10 days.   Cranberry 500 MG Caps Take by mouth daily.   DULoxetine 20 MG capsule Commonly known as: CYMBALTA Take 20 mg by mouth daily.   furosemide  20 MG tablet Commonly known as: LASIX Take 1 tablet (20 mg total) by mouth daily for 3 days.   loperamide 2 MG tablet Commonly known as: IMODIUM A-D Take 2 mg by mouth as needed for diarrhea or loose stools.   Melatonin 5 MG Caps Take 1 capsule by mouth.   metoprolol succinate 25 MG 24 hr tablet Commonly known as: TOPROL-XL Take 25 mg by mouth daily.   pantoprazole 40 MG tablet Commonly known as: PROTONIX Take 1 tablet (40 mg total) by mouth 2 (two) times daily before a meal. What changed: when to take this   Potassium 99 MG Tabs Take 1 tablet by mouth daily.   PROBIOTIC COLIC PO Take 1 Dose by mouth daily. doterra brands   vitamin B-12 1000  MCG tablet Commonly known as: CYANOCOBALAMIN Take 1,000 mcg by mouth daily.   vitamin C 1000 MG tablet Take 1,000 mg by mouth daily.   Vitamin D 50 MCG (2000 UT) Caps Take 1 capsule by mouth daily.      DISCHARGE INSTRUCTIONS:   DIET:  Regular diet DISCHARGE CONDITION:  Stable ACTIVITY:  Activity as tolerated OXYGEN:  Home Oxygen: No.  Oxygen Delivery: room air DISCHARGE LOCATION:  nursing home   If you experience worsening of your admission symptoms, develop shortness of breath, life threatening emergency, suicidal or homicidal thoughts you must seek medical attention immediately by calling 911 or calling your MD immediately  if symptoms less severe.  You Must read complete instructions/literature along with all the possible adverse reactions/side effects for all the Medicines you take and that have been prescribed to you. Take any new Medicines after you have completely understood and accpet all the possible adverse reactions/side effects.   Please note  You were cared for by a hospitalist during your hospital stay. If you have any questions about your discharge medications or the care you received while you were in the hospital after you are discharged, you can call the unit and asked to speak with the hospitalist on call if the hospitalist that took care of you is not available. Once you are discharged, your primary care physician will handle any further medical issues. Please note that NO REFILLS for any discharge medications will be authorized once you are discharged, as it is imperative that you return to your primary care physician (or establish a relationship with a primary care physician if you do not have one) for your aftercare needs so that they can reassess your need for medications and monitor your lab values.    On the day of Discharge:  VITAL SIGNS:  Blood pressure 119/80, pulse 71, temperature (!) 97.5 F (36.4 C), temperature source Oral, resp. rate 18,  height _0  (1.6 m), weight 62.9 kg, SpO2 97 %. PHYSICAL EXAMINATION:  GENERAL:  87 y.o.-year-old patient lying in the bed with no acute distress.  EYES: Pupils equal, round, reactive to light and accommodation. No scleral icterus. Extraocular muscles intact.  HEENT: Head atraumatic, normocephalic. Oropharynx and nasopharynx clear.  NECK:  Supple, no jugular venous distention. No thyroid enlargement, no tenderness.  LUNGS: Normal breath sounds bilaterally, no wheezing, rales,rhonchi or crepitation. No use of accessory muscles of respiration.  CARDIOVASCULAR: S1, S2 normal. No murmurs, rubs, or gallops.  ABDOMEN: Soft, non-tender, non-distended. Bowel sounds present. No organomegaly or mass.  EXTREMITIES: No pedal edema, cyanosis, or clubbing.  NEUROLOGIC: Cranial nerves II through XII are intact. Muscle strength 5/5 in all extremities. Sensation intact. Gait not checked.  PSYCHIATRIC:  The patient is alert and oriented x 3.  SKIN: No obvious rash, lesion, or ulcer.  DATA REVIEW:   CBC Recent Labs  Lab 11/02/20 0552  WBC 5.3  HGB 12.0  HCT 37.0  PLT 347    Chemistries  Recent Labs  Lab 10/31/20 1742 11/01/20 0434 11/02/20 0552  NA 134*   < > 133*  K 3.4*   < > 3.3*  CL 97*   < > 100  CO2 25   < > 25  GLUCOSE 171*   < > 96  BUN 12   < > 7*  CREATININE 0.52   < > 0.47  CALCIUM 8.8*   < > 8.3*  AST 13*  --   --   ALT 10  --   --   ALKPHOS 88  --   --   BILITOT 0.7  --   --    < > = values in this interval not displayed.     Outpatient follow-up  Follow-up Information    Sandra Pink, MD. Schedule an appointment as soon as possible for a visit in 1 week(s).   Specialty: Family Medicine Contact information: [place] [address] [phone]        [name], MD. Schedule an appointment as soon as possible for a visit in 2 week(s).   Specialty: Gastroenterology Contact information: [place]  [phone]               30 Day Unplanned Readmission Risk Score   Flowsheet Row ED to Hosp-Admission (Current) from 10/31/2020 in [facility]  30 Day Unplanned Readmission Risk Score (%) 22.93 Filed at 11/02/2020 1200     This score is the patient's risk of an unplanned readmission within 30 days of being discharged (0 -100%). The score is based on dignosis, age, lab data, medications, orders, and past utilization.   Low:  0-14.9   Medium: 15-21.9   High: 22-29.9   Extreme: 30 and above         Management plans discussed with the patient, family and they are in agreement.  CODE STATUS: DNR   TOTAL TIME TAKING CARE OF THIS PATIENT: 45 minutes.    Max Sane M.D on 11/02/2020 at 12:26 PM  [facility]   CC: Primary care physician; Sandra Pink, MD   Note: This dictation was prepared with Dragon dictation along with smaller phrase technology. Any transcriptional errors that result from this process are unintentional.

## 2020-11-02 NOTE — Discharge Instructions (Signed)
COVID-19 Quarantine vs. Isolation QUARANTINE keeps someone who was in close contact with someone who has COVID-19 away from others. Quarantine if you have been in close contact with someone who has COVID-19, unless you have been fully vaccinated. If you are fully vaccinated  You do NOT need to quarantine unless they have symptoms  Get tested 3-5 days after your exposure, even if you don't have symptoms  Wear a mask indoors in public for 14 days following exposure or until your test result is negative If you are not fully vaccinated  Stay home for 14 days after your last contact with a person who has COVID-19  Watch for fever (100.4F), cough, shortness of breath, or other symptoms of COVID-19  If possible, stay away from people you live with, especially people who are at higher risk for getting very sick from COVID-19  Contact your local public health department for options in your area to possibly shorten your quarantine ISOLATION keeps someone who is sick or tested positive for COVID-19 without symptoms away from others, even in their own home. People who are in isolation should stay home and stay in a specific "sick room" or area and use a separate bathroom (if available). If you are sick and think or know you have COVID-19 Stay home until after  At least 10 days since symptoms first appeared and  At least 24 hours with no fever without the use of fever-reducing medications and  Symptoms have improved If you tested positive for COVID-19 but do not have symptoms  Stay home until after 10 days have passed since your positive viral test  If you develop symptoms after testing positive, follow the steps above for those who are sick cdc.gov/coronavirus 02/23/2020 This information is not intended to replace advice given to you by your health care provider. Make sure you discuss any questions you have with your health care provider. Document Revised: 03/29/2020 Document Reviewed:  03/29/2020 Elsevier Patient Education  2021 Elsevier Inc.  

## 2020-11-02 NOTE — TOC Progression Note (Signed)
Transition of Care [facility]) - Progression Note    Patient Details  Name: [name] MRN: [id number] Date of Birth: [birth year]  [facility]) CM/SW Contact  Kerin Salen, RN Phone Number: 11/02/2020, 12:01 PM  Clinical Narrative:  Patient to discharge to [facility], Tammy, Admissions Director notified and approved, admission to room 711 due to [place] + dx. Daughter notified of discharge and room number. TOC barriers resolved.       Barriers to Discharge: Barriers Resolved  Expected Discharge Plan and Services                           DME Arranged: N/A DME Agency: NA       HH Arranged: NA HH Agency: NA         Social Determinants of Health (SDOH) Interventions    Readmission Risk Interventions Readmission Risk Prevention Plan 08/30/2020 08/24/2020  Transportation Screening Complete Complete  PCP or Specialist Appt within 5-7 Days - Complete  PCP or Specialist Appt within 3-5 Days (No Data) -  Home Care Screening - Complete  Medication Review (RN CM) - Complete  HRI or [place] (No Data) -  Palliative Care Screening Complete -  Medication Review (RN Care Manager) Complete -  Some recent data might be hidden

## 2020-11-10 ENCOUNTER — Non-Acute Institutional Stay: Payer: Medicare Other | Admitting: Primary Care

## 2020-11-10 ENCOUNTER — Other Ambulatory Visit: Payer: Self-pay

## 2020-11-10 DIAGNOSIS — Z515 Encounter for palliative care: Secondary | ICD-10-CM

## 2020-11-10 DIAGNOSIS — U071 COVID-19: Secondary | ICD-10-CM

## 2020-11-10 DIAGNOSIS — M25561 Pain in right knee: Secondary | ICD-10-CM | POA: Insufficient documentation

## 2020-11-10 NOTE — Progress Notes (Signed)
[facility] Consult Note Telephone: [phone]  Fax: [phone]    Date of encounter: 11/10/20 PATIENT NAME: [name] [address] [place] [address]   [phone] (home)  DOB: [birth year] MRN: [id number] PRIMARY CARE PROVIDER:    [name], MD,  [place] [address] [phone]  REFERRING PROVIDER:   Rica Koyanagi, MD [address] [place],  [place] [address] [phone]   RESPONSIBLE PARTY:    Contact Information     Name Relation Home Work [name] Daughter [phone]  [phone]   Julieanna, Geraci [phone]  [phone]   [facility] Son   [phone]   Cayden, Rautio   [phone]       I met face to face [facility]. Palliative Care was asked to follow this patient by consultation request of Dr Rica Koyanagi to address advance care planning and complex medical decision making. This is a follow up visit.                                   ASSESSMENT AND PLAN / RECOMMENDATIONS:   Advance Care Planning/Goals of Care: Goals include to maximize quality of life and symptom management. Our advance care planning conversation included a discussion about:     Exploration of personal, cultural or spiritual beliefs that might influence medical decisions  Exploration of goals of care in the event of a sudden injury or illness   CODE STATUS: DNR, comfort measures  Symptom Management/Plan:  I met with patient in his nursing home room. He has newly diagnosed Covid. He states he's not feeling very well and is indeed much more subdued than our last visit. He's not exhibiting respiratory symptoms. Her intake has not been good for sometime and he has lost some weight. I will discuss goals with family. Goals have been for him to return to independent living but this Covid setback will likely delay that. He may benefit from  supportive services of hospice if such services are concordant with patient family care goals.    I met with patient in her nursing home room her son was also in attendance. Patient had been making good progress toward going home to live again independently after a fall and pelvis fracture when she sustained a Covid infection. Son endorses that she seems more fatigued in the second and third week which is not uncommon, he was told. Patient is alert and interactive but does appear fatigued.   Pain: She does also endorse right knee pain. There is not a sign of a acute inflammation such as gout but likely more chronic osteoarthritis. I've ordered Tylenol CR 650 mg PO Q8 hours around the clock. I've provided teaching with the son that chronic pain is best treated around the clock not on an as needed basis, that  this will not  pose any issues with coagulation and is safer than narcotic. He was in agreement.   Fatigue: We discussed recent imaging and labs that Loletha Carrow  NP Liz Malady ordered. She states these were negative for any concerning factors such as pneumonia, dehydration etc. Likely this fatigue is part of the Covid recovery process. We discussed  the recovery process potentially being weeks to months which was a discouragement. We will continue to provide support and clarification of goals of care.  Follow up Palliative Care Visit: Palliative care will continue to follow for complex medical decision making, advance care planning, and clarification of goals. Return 4 weeks or prn.  I spent 35 minutes providing this consultation. More than 50% of the time in this consultation was spent in counseling and care coordination.  PPS: 30%  HOSPICE ELIGIBILITY/DIAGNOSIS: TBD  Chief Complaint: fatigue and pain  HISTORY OF PRESENT ILLNESS:  JIYA KISSINGER is a 87 y.o. year old female  with delirium leading to a fall and fx of pelvis, new onset R knee pain, h/o OA, recent covid infection .   History  obtained from review of EMR, discussion with primary team, and interview with family, facility staff/caregiver and/or [name].  I reviewed available labs, medications, imaging, studies and related documents from the EMR.  Records reviewed and summarized above.   ROS   General: NAD EYES: denies vision changes ENMT: denies dysphagia Cardiovascular: denies chest pain, denies DOE Pulmonary: denies cough, endorses occ increased SOB, recent covid + Abdomen: endorses fair appetite, denies constipation GU: denies dysuria MSK:  endorses fatigue and weakness,  no falls reported Skin: denies rashes or wounds Neurological: endorses R knee pain,  memory impairment Psych: Endorses positive mood Heme/lymph/immuno: denies bruises, abnormal bleeding  Physical Exam: Current and past weights:136 lbs, concerning 16% wt  loss in 2 months Constitutional: NAD General: frail appearing, thin EYES: anicteric sclera, lids intact, no discharge  ENMT: intact hearing, oral mucous membranes moist, dentition intact CV: slight bil  LE edema, S1S2, RRR Pulmonary: LCTA, no increased work of breathing, no cough noted Abdomen: intake 75%, no ascites GU: deferred MSK: + sarcopenia, moves all extremities, non ambulatory Skin: warm and dry, no rashes or wounds on visible skin Neuro:  + generalized weakness,  + cognitive impairment Psych: non-anxious affect, A and O x 2 Hem/lymph/immuno: no widespread bruising   Thank you for the opportunity to participate in the care of [name].  The palliative care team will continue to follow. Please call our office at [phone] if we can be of additional assistance.   Jason Coop, NP , DNP, MPH, AGPCNP-BC, ACHPN  COVID-19 PATIENT SCREENING TOOL Asked and negative response unless otherwise noted:   Have you had symptoms of covid, tested positive or been in contact with someone with symptoms/positive test in the past 5-10 days?

## 2020-11-12 ENCOUNTER — Other Ambulatory Visit: Payer: Self-pay

## 2020-11-12 ENCOUNTER — Emergency Department: Payer: Medicare Other

## 2020-11-12 ENCOUNTER — Inpatient Hospital Stay
Admission: EM | Admit: 2020-11-12 | Discharge: 2020-11-19 | DRG: 689 | Disposition: A | Discharge status: Skilled Nursing Facility | Payer: Medicare Other | Source: Skilled Nursing Facility | Attending: Internal Medicine | Admitting: Internal Medicine

## 2020-11-12 DIAGNOSIS — F32A Depression, unspecified: Secondary | ICD-10-CM | POA: Diagnosis present

## 2020-11-12 DIAGNOSIS — Z7901 Long term (current) use of anticoagulants: Secondary | ICD-10-CM

## 2020-11-12 DIAGNOSIS — Z86711 Personal history of pulmonary embolism: Secondary | ICD-10-CM | POA: Diagnosis present

## 2020-11-12 DIAGNOSIS — I480 Paroxysmal atrial fibrillation: Secondary | ICD-10-CM

## 2020-11-12 DIAGNOSIS — Z9012 Acquired absence of left breast and nipple: Secondary | ICD-10-CM

## 2020-11-12 DIAGNOSIS — R627 Adult failure to thrive: Secondary | ICD-10-CM | POA: Diagnosis present

## 2020-11-12 DIAGNOSIS — Z885 Allergy status to narcotic agent status: Secondary | ICD-10-CM

## 2020-11-12 DIAGNOSIS — Z800 Family history of malignant neoplasm of digestive organs: Secondary | ICD-10-CM

## 2020-11-12 DIAGNOSIS — S81812A Laceration without foreign body, left lower leg, initial encounter: Secondary | ICD-10-CM

## 2020-11-12 DIAGNOSIS — E440 Moderate protein-calorie malnutrition: Secondary | ICD-10-CM | POA: Insufficient documentation

## 2020-11-12 DIAGNOSIS — N3000 Acute cystitis without hematuria: Principal | ICD-10-CM

## 2020-11-12 DIAGNOSIS — Z8616 Personal history of COVID-19: Secondary | ICD-10-CM

## 2020-11-12 DIAGNOSIS — Z79899 Other long term (current) drug therapy: Secondary | ICD-10-CM

## 2020-11-12 DIAGNOSIS — M25432 Effusion, left wrist: Secondary | ICD-10-CM

## 2020-11-12 DIAGNOSIS — W19XXXA Unspecified fall, initial encounter: Secondary | ICD-10-CM | POA: Diagnosis present

## 2020-11-12 DIAGNOSIS — K449 Diaphragmatic hernia without obstruction or gangrene: Secondary | ICD-10-CM | POA: Diagnosis present

## 2020-11-12 DIAGNOSIS — K219 Gastro-esophageal reflux disease without esophagitis: Secondary | ICD-10-CM | POA: Diagnosis present

## 2020-11-12 DIAGNOSIS — L89301 Pressure ulcer of unspecified buttock, stage 1: Secondary | ICD-10-CM | POA: Diagnosis present

## 2020-11-12 DIAGNOSIS — Y929 Unspecified place or not applicable: Secondary | ICD-10-CM

## 2020-11-12 DIAGNOSIS — R531 Weakness: Secondary | ICD-10-CM

## 2020-11-12 DIAGNOSIS — R262 Difficulty in walking, not elsewhere classified: Secondary | ICD-10-CM | POA: Diagnosis present

## 2020-11-12 DIAGNOSIS — D75839 Thrombocytosis, unspecified: Secondary | ICD-10-CM | POA: Diagnosis present

## 2020-11-12 DIAGNOSIS — R410 Disorientation, unspecified: Secondary | ICD-10-CM

## 2020-11-12 DIAGNOSIS — E871 Hypo-osmolality and hyponatremia: Secondary | ICD-10-CM | POA: Diagnosis present

## 2020-11-12 DIAGNOSIS — Z801 Family history of malignant neoplasm of trachea, bronchus and lung: Secondary | ICD-10-CM

## 2020-11-12 DIAGNOSIS — Z853 Personal history of malignant neoplasm of breast: Secondary | ICD-10-CM

## 2020-11-12 DIAGNOSIS — D649 Anemia, unspecified: Secondary | ICD-10-CM | POA: Diagnosis present

## 2020-11-12 DIAGNOSIS — L8990 Pressure ulcer of unspecified site, unspecified stage: Secondary | ICD-10-CM | POA: Insufficient documentation

## 2020-11-12 DIAGNOSIS — E876 Hypokalemia: Secondary | ICD-10-CM | POA: Diagnosis present

## 2020-11-12 DIAGNOSIS — R54 Age-related physical debility: Secondary | ICD-10-CM | POA: Diagnosis present

## 2020-11-12 DIAGNOSIS — Z6821 Body mass index (BMI) 21.0-21.9, adult: Secondary | ICD-10-CM

## 2020-11-12 DIAGNOSIS — Z86718 Personal history of other venous thrombosis and embolism: Secondary | ICD-10-CM

## 2020-11-12 DIAGNOSIS — Z23 Encounter for immunization: Secondary | ICD-10-CM

## 2020-11-12 DIAGNOSIS — Z66 Do not resuscitate: Secondary | ICD-10-CM | POA: Diagnosis present

## 2020-11-12 DIAGNOSIS — Z808 Family history of malignant neoplasm of other organs or systems: Secondary | ICD-10-CM

## 2020-11-12 DIAGNOSIS — Z806 Family history of leukemia: Secondary | ICD-10-CM

## 2020-11-12 DIAGNOSIS — G9341 Metabolic encephalopathy: Secondary | ICD-10-CM | POA: Diagnosis present

## 2020-11-12 DIAGNOSIS — I10 Essential (primary) hypertension: Secondary | ICD-10-CM

## 2020-11-12 LAB — BASIC METABOLIC PANEL
Anion gap: 9 (ref 5–15)
BUN: 11 mg/dL (ref 8–23)
CO2: 26 mmol/L (ref 22–32)
Calcium: 8.4 mg/dL — ABNORMAL LOW (ref 8.9–10.3)
Chloride: 95 mmol/L — ABNORMAL LOW (ref 98–111)
Creatinine, Ser: 0.82 mg/dL (ref 0.44–1.00)
GFR, Estimated: >60 mL/min (ref >60)
Glucose, Bld: 267 mg/dL — ABNORMAL HIGH (ref 70–99)
Potassium: 2.7 mmol/L — CL (ref 3.5–5.1)
Sodium: 130 mmol/L — ABNORMAL LOW (ref 135–145)

## 2020-11-12 LAB — CBC
HCT: 37.4 % (ref 36.0–46.0)
Hemoglobin: 12.1 g/dL (ref 12.0–15.0)
MCH: 27 pg (ref 26.0–34.0)
MCHC: 32.4 g/dL (ref 30.0–36.0)
MCV: 83.5 fL (ref 80.0–100.0)
Platelets: 472 10*3/uL — ABNORMAL HIGH (ref 150–400)
RBC: 4.48 MIL/uL (ref 3.87–5.11)
RDW: 16.1 % — ABNORMAL HIGH (ref 11.5–15.5)
WBC: 12.8 10*3/uL — ABNORMAL HIGH (ref 4.0–10.5)
nRBC: 0 % (ref 0.0–0.2)

## 2020-11-12 LAB — MAGNESIUM: Magnesium: 1.7 mg/dL (ref 1.7–2.4)

## 2020-11-12 MED ORDER — POTASSIUM CHLORIDE 10 MEQ/100ML IV SOLN
10.0000 meq | INTRAVENOUS | Status: DC
  Administered 2020-11-13: 10 meq via INTRAVENOUS
  Filled 2020-11-12: qty 100

## 2020-11-12 MED ORDER — POTASSIUM CHLORIDE CRYS ER 10 MEQ PO TBCR: 20.0000 meq | EXTENDED_RELEASE_TABLET | Freq: Every day | ORAL | 0 refills | Status: AC

## 2020-11-12 MED ORDER — POTASSIUM CHLORIDE CRYS ER 20 MEQ PO TBCR
40.0000 meq | EXTENDED_RELEASE_TABLET | Freq: Once | ORAL | Status: AC
  Administered 2020-11-12: 40 meq via ORAL
  Filled 2020-11-12: qty 2

## 2020-11-12 MED ORDER — MAGNESIUM SULFATE IN D5W 1-5 GM/100ML-% IV SOLN
1.0000 g | Freq: Once | INTRAVENOUS | Status: AC
  Administered 2020-11-13: 1 g via INTRAVENOUS
  Filled 2020-11-12: qty 100

## 2020-11-12 MED ORDER — TETANUS-DIPHTH-ACELL PERTUSSIS 5-2.5-18.5 LF-MCG/0.5 IM SUSY
0.5000 mL | PREFILLED_SYRINGE | Freq: Once | INTRAMUSCULAR | Status: AC
  Administered 2020-11-12: 0.5 mL via INTRAMUSCULAR
  Filled 2020-11-12: qty 0.5

## 2020-11-12 MED ORDER — CEPHALEXIN 250 MG PO CAPS: 250.0000 mg | ORAL_CAPSULE | Freq: Two times a day (BID) | ORAL | 0 refills | Status: DC

## 2020-11-12 MED ORDER — SODIUM CHLORIDE 0.9 % IV BOLUS
500.0000 mL | Freq: Once | INTRAVENOUS | Status: AC
  Administered 2020-11-12: 500 mL via INTRAVENOUS

## 2020-11-12 MED ORDER — CEPHALEXIN 250 MG PO CAPS
250.0000 mg | ORAL_CAPSULE | Freq: Once | ORAL | Status: AC
  Administered 2020-11-12: 250 mg via ORAL
  Filled 2020-11-12: qty 1

## 2020-11-12 MED ORDER — LIDOCAINE-EPINEPHRINE 2 %-1:100000 IJ SOLN
20.0000 mL | Freq: Once | INTRAMUSCULAR | Status: AC
  Administered 2020-11-12: 20 mL via INTRADERMAL
  Filled 2020-11-12: qty 1

## 2020-11-12 MED ORDER — POTASSIUM CHLORIDE 10 MEQ/100ML IV SOLN
10.0000 meq | Freq: Once | INTRAVENOUS | Status: AC
  Administered 2020-11-12: 10 meq via INTRAVENOUS
  Filled 2020-11-12: qty 100

## 2020-11-12 NOTE — ED Notes (Signed)
MOST FORM in Chart

## 2020-11-12 NOTE — ED Triage Notes (Signed)
Pt from [facility] via EMS with complaints of weakness and left leg laceration. Bleeding controlled. Pt awake, alert to self, place. Pt denies any pain. No respiratory distress noted

## 2020-11-12 NOTE — ED Provider Notes (Addendum)
[facility] Emergency Department Provider Note    Event Date/Time   First MD Initiated Contact with Patient 11/12/20 2135     (approximate)  I have reviewed the triage vital signs and the nursing notes.   HISTORY  Chief Complaint Weakness and Extremity Laceration    HPI [name] is a 87 y.o. female with the below listed past medical history presents to the ER for evaluation of generalized malaise for the past week status post recent COVID-19 illness having difficulty walking got up and was moving to a wheelchair and somehow injured her left leg causing laceration.  Denies any other associated injury.  Family member at bedside states that she is had decreased p.o. intake since she got sick.  Past Medical History:  Diagnosis Date   Allergy    Arthritis    back   Benign neoplasm of skin, site unspecified    Bowel trouble    Breast cancer ([place])    Cancer ([place])    colon 20 years ago   Cancer of breast ([place]) April 10,.2013   Left breast, 3.8 cm, 4 cm axillary node, T2, N1a, ER negative PR negative, HER-2/neu not amplified.   Cataract    Colon cancer ([place])    Hard of hearing    Heartburn    Hiatal hernia    History of stomach ulcers    History of stomach ulcers    Hypertension 2012   Personal history of malignant neoplasm of breast 2013   LEFT MASTECTOMY,left modified radical mastectomy on September 06, 2011 483.8 cm primary tumor with a 4.6 cm axillary metastasis. 3 of 13 nodes were positive. T2,N1a lesion. This was a triple negative lesion   Shingles Dec 2015   Vaginal atrophy 08/10/2015   Wrist fracture, right    Family History  Problem Relation Age of Onset   Lung cancer Brother        colono ca   Cancer Brother    Skin cancer Daughter    Leukemia Father    Cancer Father    Colon cancer Mother    Cancer Mother    Skin cancer Son    Kidney disease Neg Hx    Bladder Cancer Neg Hx    Past Surgical History:  Procedure Laterality Date    ABDOMINAL HYSTERECTOMY     42 YEARS AGO   BREAST SURGERY Left 09-06-11   left modified radical mastectomy on September 06, 2011 483.8 cm primary tumor with a 4.6 cm axillary metastasis. 3 of 13 nodes were positive. T2,N1a lesion. This was a triple negative lesion   CARPAL TUNNEL RELEASE Right September 25, 2014   Skip Estimable, M.D.   COLON RESECTION  1985   COLON SURGERY  [place]  2012   DR. ELLIOTT   COLONOSCOPY WITH PROPOFOL N/A 08/25/2016   Procedure: COLONOSCOPY WITH PROPOFOL;  Surgeon: [name], MD;  Location: [facility] ENDOSCOPY;  Service: Gastroenterology;  Laterality: N/A;   ESOPHAGOGASTRODUODENOSCOPY (EGD) WITH PROPOFOL N/A 08/23/2016   Procedure: ESOPHAGOGASTRODUODENOSCOPY (EGD) WITH PROPOFOL;  Surgeon: [name], MD;  Location: [facility] ENDOSCOPY;  Service: Endoscopy;  Laterality: N/A;   MASTECTOMY Left 2013   left modified radical mastectomy on September 06, 2011 Stage 2; 3.8 cm primary tumor with a 4.6 cm axillary metastasis. 3 of 13 nodes were positive. T2,N1a lesion. This was a triple negative lesion   UPPER GI ENDOSCOPY  2012   BLEEDING   Patient Active Problem List  Diagnosis Date Noted   Knee pain, right 11/10/2020   History of pulmonary embolus (PE) 11/01/2020   COVID-19 virus infection 11/01/2020   Acute GI bleeding 10/31/2020   Severe sepsis with septic shock ([place]) 08/21/2020   Depression 00/76/2263   Acute metabolic encephalopathy 33/54/5625   Fall at home, initial encounter 08/21/2020   Pelvic ring fracture ([place]) 08/21/2020   Hypokalemia 08/21/2020   Hyperglycemia 08/21/2020   Functional diarrhea 07/29/2019   Open wound of left knee, leg, and ankle with complication 63/89/3734   Chest wall pain 10/08/2017   History of recurrent urinary tract infection 02/03/2017   Vaginal pessary in situ 02/03/2017   Iron deficiency anemia due to chronic blood loss 12/01/2016   Personal history of malignant neoplasm of breast 11/09/2016   GI bleed 08/21/2016   Hereditary  and idiopathic peripheral neuropathy 09/09/2015   Malignant neoplasm of left female breast ([place]) 09/09/2015   Prolapse of vaginal wall with midline cystocele 08/10/2015   Incomplete bladder emptying 08/10/2015   Midline low back pain without sciatica 08/10/2015   Fecal incontinence 08/10/2015   Recurrent UTI 07/19/2015   Atrophic vaginitis 07/19/2015   Adenomatous colon polyp 02/24/2015   Anemia 02/24/2015   Chronic sinusitis 02/24/2015   Diverticulosis 02/24/2015   Esophagitis 02/24/2015   GERD (gastroesophageal reflux disease) 02/24/2015   Hiatal hernia 02/24/2015   Breast cancer, left breast ([place]) 09/05/2012   Hypertension       Prior to Admission medications   Medication Sig Start Date End Date Taking? Authorizing Provider  cephALEXin (KEFLEX) 250 MG capsule Take 1 capsule (250 mg total) by mouth 2 (two) times daily for 7 days. 11/12/20 11/19/20 Yes Merlyn Lot, MD  potassium chloride (KLOR-CON) 10 MEQ tablet Take 2 tablets (20 mEq total) by mouth daily for 4 days. 11/12/20 11/16/20 Yes Merlyn Lot, MD  apixaban (ELIQUIS) 5 MG TABS tablet 10 mg BID for 3 more days then switch to 5 mg BID 08/30/20   Caren Griffins, MD  Ascorbic Acid (VITAMIN C) 1000 MG tablet Take 1,000 mg by mouth daily.    [provider]  Cholecalciferol (VITAMIN D) 50 MCG (2000 UT) CAPS Take 1 capsule by mouth daily.    [provider]  Cranberry 500 MG CAPS Take by mouth daily.    [provider]  DULoxetine (CYMBALTA) 20 MG capsule Take 20 mg by mouth daily.    [provider]  furosemide (LASIX) 20 MG tablet Take 1 tablet (20 mg total) by mouth daily for 3 days. 08/31/20 09/03/20  Caren Griffins, MD  Lactobacillus Rhamnosus, GG, (PROBIOTIC COLIC PO) Take 1 Dose by mouth daily. doterra brands    [provider]  loperamide (IMODIUM A-D) 2 MG tablet Take 2 mg by mouth as needed for diarrhea or loose stools.    [provider]  Melatonin 5 MG CAPS  Take 1 capsule by mouth.     [provider]  metoprolol succinate (TOPROL-XL) 25 MG 24 hr tablet Take 25 mg by mouth daily.    [provider]  pantoprazole (PROTONIX) 40 MG tablet Take 1 tablet (40 mg total) by mouth 2 (two) times daily before a meal. 11/02/20   Max Sane, MD  Potassium 99 MG TABS Take 1 tablet by mouth daily.    [provider]  vitamin B-12 (CYANOCOBALAMIN) 1000 MCG tablet Take 1,000 mcg by mouth daily.    [provider]    Allergies Codeine    Social History Social  History   Tobacco Use   Smoking status: Never   Smokeless tobacco: Never  Vaping Use   Vaping Use: Never used  Substance Use Topics   Alcohol use: No   Drug use: No    Review of Systems Patient denies headaches, rhinorrhea, blurry vision, numbness, shortness of breath, chest pain, edema, cough, abdominal pain, nausea, vomiting, diarrhea, dysuria, fevers, rashes or hallucinations unless otherwise stated above in HPI. ____________________________________________   PHYSICAL EXAM:  VITAL SIGNS: Vitals:   11/12/20 2203 11/12/20 2322  BP: (!) 146/104 (!) 145/86  Pulse: 94 75  Resp: 17 18  Temp:    SpO2: 99% 98%    Constitutional: Alert, chronically ill appearing Eyes: Conjunctivae are normal.  Head: Atraumatic. Nose: No congestion/rhinnorhea. Mouth/Throat: Mucous membranes are moist.   Neck: No stridor. Painless ROM.  Cardiovascular: Normal rate, regular rhythm. Grossly normal heart sounds.  Good peripheral circulation. Respiratory: Normal respiratory effort.  No retractions. Lungs CTAB. Gastrointestinal: Soft and nontender. No distention. No abdominal bruits. No CVA tenderness. Genitourinary: deferred Musculoskeletal: Large skin tear to left lateral lower leg with 4 cm underlying laceration and crescent shape no foreign body.  No joint effusions. Neurologic:  Normal speech and language. No gross focal neurologic deficits are appreciated. No facial  droop Skin:  Skin is warm, dry and intact. No rash noted. Psychiatric: Mood and affect are normal. Speech and behavior are normal.  ____________________________________________   LABS (all labs ordered are listed, but only abnormal results are displayed)  Results for orders placed or performed during the hospital encounter of 11/12/20 (from the past 24 hour(s))  Basic metabolic panel     Status: Abnormal   Collection Time: 11/12/20  7:36 PM  Result Value Ref Range   Sodium 130 (L) 135 - 145 mmol/L   Potassium 2.7 (LL) 3.5 - 5.1 mmol/L   Chloride 95 (L) 98 - 111 mmol/L   CO2 26 22 - 32 mmol/L   Glucose, Bld 267 (H) 70 - 99 mg/dL   BUN 11 8 - 23 mg/dL   Creatinine, Ser 0.82 0.44 - 1.00 mg/dL   Calcium 8.4 (L) 8.9 - 10.3 mg/dL   GFR, Estimated >60 >60 mL/min   Anion gap 9 5 - 15  CBC     Status: Abnormal   Collection Time: 11/12/20  7:36 PM  Result Value Ref Range   WBC 12.8 (H) 4.0 - 10.5 K/uL   RBC 4.48 3.87 - 5.11 MIL/uL   Hemoglobin 12.1 12.0 - 15.0 g/dL   HCT 37.4 36.0 - 46.0 %   MCV 83.5 80.0 - 100.0 fL   MCH 27.0 26.0 - 34.0 pg   MCHC 32.4 30.0 - 36.0 g/dL   RDW 16.1 (H) 11.5 - 15.5 %   Platelets 472 (H) 150 - 400 K/uL   nRBC 0.0 0.0 - 0.2 %  Magnesium     Status: None   Collection Time: 11/12/20  7:36 PM  Result Value Ref Range   Magnesium 1.7 1.7 - 2.4 mg/dL   ____________________________________________  EKG My review and personal interpretation at Time: 19:36   Indication: fall  Rate: 85  Rhythm: afib Axis: normal Other: non specific st and tw abnormality ____________________________________________  RADIOLOGY  I personally reviewed all radiographic images ordered to evaluate for the above acute complaints and reviewed radiology reports and findings.  These findings were personally discussed with the patient.  Please see medical record for radiology report.  ____________________________________________   PROCEDURES  Procedure(s) performed:   Marland KitchenMarland Kitchen  Laceration Repair  Date/Time: 11/12/2020 11:29 PM Performed by: Merlyn Lot, MD Authorized by: Merlyn Lot, MD   Consent:    Consent obtained:  Verbal   Consent given by:  Patient   Risks discussed:  Infection, pain, retained foreign body, poor cosmetic result and poor wound healing Anesthesia:    Anesthesia method:  Local infiltration   Local anesthetic:  Lidocaine 1% w/o epi Laceration details:    Location:  Leg   Leg location:  L lower leg   Length (cm):  5   Depth (mm):  5 Pre-procedure details:    Preparation:  Patient was prepped and draped in usual sterile fashion Exploration:    Hemostasis achieved with:  Direct pressure   Imaging obtained: x-ray     Wound exploration: wound explored through full range of motion and entire depth of wound visualized     Wound extent: foreign bodies/material     Foreign bodies/material:  Small pieces of fabric looking material   Contaminated: no   Treatment:    Area cleansed with:  Saline and povidone-iodine   Amount of cleaning:  Extensive   Irrigation solution:  Sterile saline   Irrigation volume:  250   Irrigation method:  Syringe   Visualized foreign bodies/material removed: yes   Skin repair:    Repair method:  Sutures   Suture size:  6-0   Wound skin closure material used: vicryl.   Suture technique:  Simple interrupted   Number of sutures:  8 Approximation:    Approximation:  Loose Repair type:    Repair type:  Simple Post-procedure details:    Dressing:  Sterile dressing   Procedure completion:  Tolerated well, no immediate complications    Critical Care performed: no ____________________________________________   INITIAL IMPRESSION / ASSESSMENT AND PLAN / ED COURSE  Pertinent labs & imaging results that were available during my care of the patient were reviewed by me and considered in my medical decision making (see chart for details).   DDX: Laceration, fracture, foreign body, contusion,  dehydration, electrolyte abnormality  [name] is a 87 y.o. who presents to the ED with presentation as described above.  Patient with fall while working with PT today.  Struck her wheelchair.  Had wound to left leg as described above which was repaired with loose approximation of the subdermal fatty tissue area.  Foreign bodies were removed.  Large skin tear overlying underlying injury was copiously irrigated.  Given her age and risk factors will place on prophylactic antibiotics.  Noted mild hyponatremia and evidence of significant hypokalemia status post recent COVID illness.  Son age risk factors with significant increase in weakness with evidence of hypokalemia will discuss with hospitalist for admission for electrolyte replacement and observation.  Patient family agreeable to plan      The patient was evaluated in Emergency Department today for the symptoms described in the history of present illness. He/she was evaluated in the context of the global COVID-19 pandemic, which necessitated consideration that the patient might be at risk for infection with the SARS-CoV-2 virus that causes COVID-19. Institutional protocols and algorithms that pertain to the evaluation of patients at risk for COVID-19 are in a state of rapid change based on information released by regulatory bodies including the CDC and federal and state organizations. These policies and algorithms were followed during the patient's care in the ED.  As part of my medical decision making, I reviewed the following data within the [facility]  notes reviewed and incorporated, Labs reviewed, notes from prior ED visits and [place] Controlled Substance Database   ____________________________________________   FINAL CLINICAL IMPRESSION(S) / ED DIAGNOSES  Final diagnoses:  Laceration of left lower extremity, initial encounter  Hypokalemia      NEW MEDICATIONS STARTED DURING THIS VISIT:  New Prescriptions    CEPHALEXIN (KEFLEX) 250 MG CAPSULE    Take 1 capsule (250 mg total) by mouth 2 (two) times daily for 7 days.   POTASSIUM CHLORIDE (KLOR-CON) 10 MEQ TABLET    Take 2 tablets (20 mEq total) by mouth daily for 4 days.     Note:  This document was prepared using Dragon voice recognition software and may include unintentional dictation errors.    Merlyn Lot, MD 11/12/20 2332    Merlyn Lot, MD 11/12/20 [phone]

## 2020-11-13 ENCOUNTER — Other Ambulatory Visit: Payer: Self-pay

## 2020-11-13 ENCOUNTER — Encounter: Payer: Self-pay | Admitting: Internal Medicine

## 2020-11-13 ENCOUNTER — Observation Stay: Payer: Medicare Other

## 2020-11-13 DIAGNOSIS — E876 Hypokalemia: Secondary | ICD-10-CM | POA: Diagnosis not present

## 2020-11-13 DIAGNOSIS — L8990 Pressure ulcer of unspecified site, unspecified stage: Secondary | ICD-10-CM | POA: Insufficient documentation

## 2020-11-13 DIAGNOSIS — W19XXXA Unspecified fall, initial encounter: Secondary | ICD-10-CM | POA: Insufficient documentation

## 2020-11-13 DIAGNOSIS — R531 Weakness: Secondary | ICD-10-CM

## 2020-11-13 DIAGNOSIS — I480 Paroxysmal atrial fibrillation: Secondary | ICD-10-CM

## 2020-11-13 LAB — CBC
HCT: 34.2 % — ABNORMAL LOW (ref 36.0–46.0)
Hemoglobin: 11.5 g/dL — ABNORMAL LOW (ref 12.0–15.0)
MCH: 27.4 pg (ref 26.0–34.0)
MCHC: 33.6 g/dL (ref 30.0–36.0)
MCV: 81.4 fL (ref 80.0–100.0)
Platelets: 454 10*3/uL — ABNORMAL HIGH (ref 150–400)
RBC: 4.2 MIL/uL (ref 3.87–5.11)
RDW: 16.2 % — ABNORMAL HIGH (ref 11.5–15.5)
WBC: 9.1 10*3/uL (ref 4.0–10.5)
nRBC: 0 % (ref 0.0–0.2)

## 2020-11-13 LAB — BASIC METABOLIC PANEL
Anion gap: 6 (ref 5–15)
BUN: 11 mg/dL (ref 8–23)
CO2: 27 mmol/L (ref 22–32)
Calcium: 8.3 mg/dL — ABNORMAL LOW (ref 8.9–10.3)
Chloride: 98 mmol/L (ref 98–111)
Creatinine, Ser: 0.42 mg/dL — ABNORMAL LOW (ref 0.44–1.00)
GFR, Estimated: >60 mL/min (ref >60)
Glucose, Bld: 123 mg/dL — ABNORMAL HIGH (ref 70–99)
Potassium: 3.4 mmol/L — ABNORMAL LOW (ref 3.5–5.1)
Sodium: 131 mmol/L — ABNORMAL LOW (ref 135–145)

## 2020-11-13 LAB — MAGNESIUM: Magnesium: 2.1 mg/dL (ref 1.7–2.4)

## 2020-11-13 MED ORDER — POTASSIUM CHLORIDE IN NACL 40-0.9 MEQ/L-% IV SOLN
INTRAVENOUS | Status: DC
  Filled 2020-11-13: qty 1000

## 2020-11-13 MED ORDER — ASCORBIC ACID 500 MG PO TABS
1000.0000 mg | ORAL_TABLET | Freq: Every day | ORAL | Status: DC
  Administered 2020-11-13 – 2020-11-19 (×7): 1000 mg via ORAL
  Filled 2020-11-13 (×7): qty 2

## 2020-11-13 MED ORDER — VITAMIN B-12 1000 MCG PO TABS
1000.0000 ug | ORAL_TABLET | Freq: Every day | ORAL | Status: DC
  Administered 2020-11-13 – 2020-11-19 (×7): 1000 ug via ORAL
  Filled 2020-11-13 (×7): qty 1

## 2020-11-13 MED ORDER — DULOXETINE HCL 20 MG PO CPEP
20.0000 mg | ORAL_CAPSULE | Freq: Every day | ORAL | Status: DC
  Administered 2020-11-13 – 2020-11-19 (×7): 20 mg via ORAL
  Filled 2020-11-13 (×7): qty 1

## 2020-11-13 MED ORDER — ONDANSETRON HCL 4 MG/2ML IJ SOLN: 4.0000 mg | Freq: Four times a day (QID) | INTRAMUSCULAR | Status: DC | PRN

## 2020-11-13 MED ORDER — RISAQUAD PO CAPS
1.0000 | ORAL_CAPSULE | Freq: Every day | ORAL | Status: DC
  Administered 2020-11-13 – 2020-11-19 (×7): 1 via ORAL
  Filled 2020-11-13 (×7): qty 1

## 2020-11-13 MED ORDER — VITAMIN D 25 MCG (1000 UNIT) PO TABS
2000.0000 [IU] | ORAL_TABLET | Freq: Every day | ORAL | Status: DC
  Administered 2020-11-13 – 2020-11-19 (×7): 2000 [IU] via ORAL
  Filled 2020-11-13 (×7): qty 2

## 2020-11-13 MED ORDER — ACETAMINOPHEN 650 MG RE SUPP: 650.0000 mg | Freq: Four times a day (QID) | RECTAL | Status: DC | PRN

## 2020-11-13 MED ORDER — PANTOPRAZOLE SODIUM 40 MG PO TBEC
40.0000 mg | DELAYED_RELEASE_TABLET | Freq: Two times a day (BID) | ORAL | Status: DC
  Administered 2020-11-13 – 2020-11-19 (×12): 40 mg via ORAL
  Filled 2020-11-13 (×12): qty 1

## 2020-11-13 MED ORDER — POTASSIUM CHLORIDE CRYS ER 20 MEQ PO TBCR
40.0000 meq | EXTENDED_RELEASE_TABLET | Freq: Once | ORAL | Status: AC
  Administered 2020-11-13: 40 meq via ORAL
  Filled 2020-11-13: qty 2

## 2020-11-13 MED ORDER — FUROSEMIDE 40 MG PO TABS: 20.0000 mg | ORAL_TABLET | Freq: Every day | ORAL | Status: DC

## 2020-11-13 MED ORDER — ADULT MULTIVITAMIN W/MINERALS CH
1.0000 | ORAL_TABLET | Freq: Every day | ORAL | Status: DC
  Administered 2020-11-14 – 2020-11-19 (×6): 1 via ORAL
  Filled 2020-11-13 (×6): qty 1

## 2020-11-13 MED ORDER — ENSURE ENLIVE PO LIQD
237.0000 mL | Freq: Two times a day (BID) | ORAL | Status: DC
  Administered 2020-11-14 – 2020-11-19 (×10): 237 mL via ORAL

## 2020-11-13 MED ORDER — MELATONIN 5 MG PO TABS
5.0000 mg | ORAL_TABLET | Freq: Every day | ORAL | Status: DC
  Administered 2020-11-13 – 2020-11-18 (×7): 5 mg via ORAL
  Filled 2020-11-13 (×8): qty 1

## 2020-11-13 MED ORDER — ACETAMINOPHEN 325 MG PO TABS
650.0000 mg | ORAL_TABLET | Freq: Four times a day (QID) | ORAL | Status: DC | PRN
  Administered 2020-11-15 – 2020-11-17 (×5): 650 mg via ORAL
  Filled 2020-11-13 (×6): qty 2

## 2020-11-13 MED ORDER — METOPROLOL SUCCINATE ER 50 MG PO TB24
25.0000 mg | ORAL_TABLET | Freq: Every day | ORAL | Status: DC
  Administered 2020-11-13 – 2020-11-19 (×7): 25 mg via ORAL
  Filled 2020-11-13 (×7): qty 1

## 2020-11-13 MED ORDER — POTASSIUM 99 MG PO TABS: 1.0000 | ORAL_TABLET | Freq: Every day | ORAL | Status: DC

## 2020-11-13 MED ORDER — ALBUTEROL SULFATE HFA 108 (90 BASE) MCG/ACT IN AERS: 1.0000 | INHALATION_SPRAY | Freq: Four times a day (QID) | RESPIRATORY_TRACT | Status: DC | PRN

## 2020-11-13 MED ORDER — ALBUTEROL SULFATE (2.5 MG/3ML) 0.083% IN NEBU: 2.5000 mg | INHALATION_SOLUTION | Freq: Four times a day (QID) | RESPIRATORY_TRACT | Status: DC | PRN

## 2020-11-13 MED ORDER — ONDANSETRON HCL 4 MG PO TABS: 4.0000 mg | ORAL_TABLET | Freq: Four times a day (QID) | ORAL | Status: DC | PRN

## 2020-11-13 NOTE — H&P (Addendum)
History and Physical    FREJA FARO LNL:[id number] DOB: [birth year] DOA: 11/12/2020  PCP: [name], MD   Patient coming from: Skilled nursing facility  I have personally briefly reviewed patient's old medical records in [place]  Chief Complaint: Weakness Most of the history was obtained from patient's son at the bedside. HPI: [name] is a 87 y.o. female with medical history significant for hypertension, history of breast cancer, history of hiatal hernia and GERD who was brought into the ER for evaluation of weakness and left leg laceration. Patient's son states that over the last couple of days she has been increasingly weak and has been wheelchair-bound.  At baseline she is able to ambulate with use of an assist device.  He states that she usually has low potassium levels which make her very weak.  Patient was being assisted to her bed by one of the healthcare workers and hit her leg on the wheelchair resulting in a huge laceration with significant bleeding so she was brought to the ER for evaluation. Left leg laceration was sutured in the ED patient was found to have a potassium level of 2.7. She denies having any nausea, no vomiting, no diarrhea, no dizziness, no headache, no fever, no chills, no blurred vision, no focal deficits, no cough, no palpitations or diaphoresis. Labs show sodium 130, potassium 2.7, chloride 95, bicarb 26, glucose 267, BUN 11, creatinine 0.82, calcium 8.4, magnesium 1.7, white count 12.8, hemoglobin 12.1, hematocrit 37.4, MCV 83.5, RDW 16.1, platelet count 472 X-ray of the left tibia-fibula shows no acute osseous abnormality.  Negative for radiopaque foreign body. Twelve-lead EKG reviewed by me shows atrial fibrillation, right bundle branch block and T wave inversions in the lateral leads.     ED Course: Patient is an 87 year old female who was brought into the ER for evaluation of a left leg laceration and weakness.  Labs reveal potassium  of 2.7. Patient will be referred to observation status for further evaluation.    Review of Systems: As per HPI otherwise all other systems reviewed and negative.    Past Medical History:  Diagnosis Date   Allergy    Arthritis    back   Benign neoplasm of skin, site unspecified    Bowel trouble    Breast cancer ([place])    Cancer ([place])    colon 20 years ago   Cancer of breast ([place]) April 10,.2013   Left breast, 3.8 cm, 4 cm axillary node, T2, N1a, ER negative PR negative, HER-2/neu not amplified.   Cataract    Colon cancer ([place])    Hard of hearing    Heartburn    Hiatal hernia    History of stomach ulcers    History of stomach ulcers    Hypertension 2012   Personal history of malignant neoplasm of breast 2013   LEFT MASTECTOMY,left modified radical mastectomy on September 06, 2011 483.8 cm primary tumor with a 4.6 cm axillary metastasis. 3 of 13 nodes were positive. T2,N1a lesion. This was a triple negative lesion   Shingles Dec 2015   Vaginal atrophy 08/10/2015   Wrist fracture, right     Past Surgical History:  Procedure Laterality Date   ABDOMINAL HYSTERECTOMY     42 YEARS AGO   BREAST SURGERY Left 09-06-11   left modified radical mastectomy on September 06, 2011 483.8 cm primary tumor with a 4.6 cm axillary metastasis. 3 of 13 nodes were positive. T2,N1a lesion. This was a triple negative  lesion   CARPAL TUNNEL RELEASE Right September 25, 2014   Skip Estimable, M.D.   COLON RESECTION  1985   COLON SURGERY  [place]  2012   DR. ELLIOTT   COLONOSCOPY WITH PROPOFOL N/A 08/25/2016   Procedure: COLONOSCOPY WITH PROPOFOL;  Surgeon: [name], MD;  Location: [facility] ENDOSCOPY;  Service: Gastroenterology;  Laterality: N/A;   ESOPHAGOGASTRODUODENOSCOPY (EGD) WITH PROPOFOL N/A 08/23/2016   Procedure: ESOPHAGOGASTRODUODENOSCOPY (EGD) WITH PROPOFOL;  Surgeon: [name], MD;  Location: [facility] ENDOSCOPY;  Service: Endoscopy;  Laterality: N/A;   MASTECTOMY Left 2013   left modified  radical mastectomy on September 06, 2011 Stage 2; 3.8 cm primary tumor with a 4.6 cm axillary metastasis. 3 of 13 nodes were positive. T2,N1a lesion. This was a triple negative lesion   UPPER GI ENDOSCOPY  2012   BLEEDING     reports that she has never smoked. She has never used smokeless tobacco. She reports that she does not drink alcohol and does not use drugs.  Allergies  Allergen Reactions   Codeine Nausea And Vomiting    Family History  Problem Relation Age of Onset   Lung cancer Brother        colono ca   Cancer Brother    Skin cancer Daughter    Leukemia Father    Cancer Father    Colon cancer Mother    Cancer Mother    Skin cancer Son    Kidney disease Neg Hx    Bladder Cancer Neg Hx       Prior to Admission medications   Medication Sig Start Date End Date Taking? Authorizing Provider  apixaban (ELIQUIS) 5 MG TABS tablet 10 mg BID for 3 more days then switch to 5 mg BID Patient taking differently: Take 5 mg by mouth 2 (two) times daily. 10 mg BID for 3 more days then switch to 5 mg BID 08/30/20  Yes Gherghe, Vella Redhead, MD  Ascorbic Acid (VITAMIN C) 1000 MG tablet Take 1,000 mg by mouth daily.   Yes [provider]  cephALEXin (KEFLEX) 250 MG capsule Take 1 capsule (250 mg total) by mouth 2 (two) times daily for 7 days. 11/12/20 11/19/20 Yes Merlyn Lot, MD  Cholecalciferol (VITAMIN D) 50 MCG (2000 UT) CAPS Take 1 capsule by mouth daily.   Yes [provider]  Cranberry 500 MG CAPS Take by mouth daily.   Yes [provider]  DULoxetine (CYMBALTA) 20 MG capsule Take 20 mg by mouth daily.   Yes [provider]  Lactobacillus Rhamnosus, GG, (PROBIOTIC COLIC PO) Take 1 Dose by mouth daily. doterra brands   Yes [provider]  Melatonin 5 MG CAPS Take 1 capsule by mouth.    Yes [provider]  metoprolol succinate (TOPROL-XL) 25 MG 24 hr tablet Take 25 mg by mouth daily.   Yes [provider]  pantoprazole  (PROTONIX) 40 MG tablet Take 1 tablet (40 mg total) by mouth 2 (two) times daily before a meal. 11/02/20  Yes Max Sane, MD  Potassium 99 MG TABS Take 1 tablet by mouth daily.   Yes [provider]  potassium chloride (KLOR-CON) 10 MEQ tablet Take 2 tablets (20 mEq total) by mouth daily for 4 days. 11/12/20 11/16/20 Yes Merlyn Lot, MD  vitamin B-12 (CYANOCOBALAMIN) 1000 MCG tablet Take 1,000 mcg by mouth daily.   Yes [provider]  albuterol (VENTOLIN HFA) 108 (90 Base) MCG/ACT inhaler Inhale 1-2 puffs into the  lungs every 6 (six) hours as needed for wheezing or shortness of breath. 10/29/20   [provider]  furosemide (LASIX) 20 MG tablet Take 1 tablet (20 mg total) by mouth daily for 3 days. 08/31/20 09/03/20  Caren Griffins, MD  loperamide (IMODIUM A-D) 2 MG tablet Take 2 mg by mouth as needed for diarrhea or loose stools.    [provider]    Physical Exam: Vitals:   11/12/20 2130 11/12/20 2203 11/12/20 2322 11/13/20 0036  BP: 128/73 (!) 146/104 (!) 145/86 127/69  Pulse: 77 94 75 78  Resp: _0 Temp:    98.3 F (36.8 C)  TempSrc:    Oral  SpO2: 100% 99% 98% 99%  Weight:      Height:         Vitals:   11/12/20 2130 11/12/20 2203 11/12/20 2322 11/13/20 0036  BP: 128/73 (!) 146/104 (!) 145/86 127/69  Pulse: 77 94 75 78  Resp: _1 Temp:    98.3 F (36.8 C)  TempSrc:    Oral  SpO2: 100% 99% 98% 99%  Weight:      Height:          Constitutional: Alert and oriented x 3 . Not in any apparent distress HEENT:      Head: Normocephalic and atraumatic.         Eyes: PERLA, EOMI, Conjunctivae are normal. Sclera is non-icteric.       Mouth/Throat: Mucous membranes are moist.       Neck: Supple with no signs of meningismus. Cardiovascular: Irregularly irregular. No murmurs, gallops, or rubs. 2+ symmetrical distal pulses are present . No JVD. No LE edema Respiratory: Respiratory effort normal .Lungs sounds clear bilaterally.  No wheezes, crackles, or rhonchi.  Gastrointestinal: Soft, non tender, and non distended with positive bowel sounds.  Genitourinary: No CVA tenderness. Musculoskeletal: Dressing over left leg.  Ecchymosis over both lower extremities.  Neurologic:  Face is symmetric. Moving all extremities. No gross focal neurologic deficits . Skin: Skin is warm, dry.  Ecchymosis over both lower extremities Psychiatric: Mood and affect are normal    Labs on Admission: I have personally reviewed following labs and imaging studies  CBC: Recent Labs  Lab 11/12/20 1936  WBC 12.8*  HGB 12.1  HCT 37.4  MCV 83.5  PLT 761*   Basic Metabolic Panel: Recent Labs  Lab 11/12/20 1936  NA 130*  K 2.7*  CL 95*  CO2 26  GLUCOSE 267*  BUN 11  CREATININE 0.82  CALCIUM 8.4*  MG 1.7   GFR: Estimated Creatinine Clearance: 38.1 mL/min (by C-G formula based on SCr of 0.82 mg/dL). Liver Function Tests: No results for input(s): AST, ALT, ALKPHOS, BILITOT, PROT, ALBUMIN in the last 168 hours. No results for input(s): LIPASE, AMYLASE in the last 168 hours. No results for input(s): AMMONIA in the last 168 hours. Coagulation Profile: No results for input(s): INR, PROTIME in the last 168 hours. Cardiac Enzymes: No results for input(s): CKTOTAL, CKMB, CKMBINDEX, TROPONINI in the last 168 hours. BNP (last 3 results) No results for input(s): PROBNP in the last 8760 hours. HbA1C: No results for input(s): HGBA1C in the last 72 hours. CBG: No results for input(s): GLUCAP in the last 168 hours. Lipid Profile: No results for input(s): CHOL, HDL, LDLCALC, TRIG, CHOLHDL, LDLDIRECT in the last 72 hours. Thyroid Function Tests: No results for input(s): TSH, T4TOTAL, FREET4, T3FREE, THYROIDAB in the last 72 hours. Anemia Panel:  No results for input(s): VITAMINB12, FOLATE, FERRITIN, TIBC, IRON, RETICCTPCT in the last 72 hours. Urine analysis:    Component Value Date/Time   COLORURINE YELLOW (A) 10/23/2020 1818    APPEARANCEUR HAZY (A) 10/23/2020 1818   APPEARANCEUR Cloudy (A) 07/19/2015 1055   LABSPEC 1.009 10/23/2020 1818   LABSPEC 1.008 01/06/2012 1922   PHURINE 5.0 10/23/2020 1818   GLUCOSEU NEGATIVE 10/23/2020 1818   GLUCOSEU 150 mg/dL 01/06/2012 1922   HGBUR NEGATIVE 10/23/2020 1818   BILIRUBINUR NEGATIVE 10/23/2020 1818   BILIRUBINUR neg 08/04/2020 1446   BILIRUBINUR Negative 07/19/2015 1055   BILIRUBINUR Negative 01/06/2012 [place] 10/23/2020 1818   PROTEINUR NEGATIVE 10/23/2020 1818   UROBILINOGEN 0.2 08/04/2020 1446   NITRITE NEGATIVE 10/23/2020 1818   LEUKOCYTESUR SMALL (A) 10/23/2020 1818   LEUKOCYTESUR Trace 01/06/2012 1922    Radiological Exams on Admission: DG Tibia/Fibula Left  Result Date: 11/12/2020 CLINICAL DATA:  Fall with soft tissue injury leg laceration EXAM: LEFT TIBIA AND FIBULA - 2 VIEW COMPARISON:  None. FINDINGS: No fracture or malalignment. Vascular calcifications. Small gas in the soft tissues on the lateral side of the proximal lower leg likely corresponding to history of laceration. No radiopaque foreign body is seen IMPRESSION: No acute osseous abnormality.  Negative for radiopaque foreign body. Electronically Signed   By: Donavan Foil M.D.   On: 11/12/2020 22:19     Assessment/Plan Principal Problem:   Hypokalemia Active Problems:   Hypertension   Depression   History of pulmonary embolus (PE)   Weakness     Symptomatic hypokalemia Unclear etiology Patient presents for evaluation of weakness and found to have a potassium level of 2.7 Supplement potassium Obtain magnesium levels Place patient on fall precautions    New onset A. Fib Patient has a CHA2DS2-VASc score of 5 Continue metoprolol for rate control Patient is already on apixaban for history of DVT as primary prophylaxis for an acute stroke Hold apixaban due to recent trauma with significant bleeding May resume apixaban in 12 to 24 hours     Depression Continue  Cymbalta    Hiatal hernia with GERD Continue Protonix   DVT prophylaxis: SCDs Code Status: DO NOT RESUSCITATE Family Communication: Greater than 50% of time was spent discussing patient's condition and plan of care with her and her son at the bedside.  CODE STATUS was discussed and she is a DO NOT RESUSCITATE Disposition Plan: Back to previous home environment Consults called: none  Status: Observation    Ariadne Rissmiller MD [facility]     11/13/2020, 1:23 AM

## 2020-11-13 NOTE — Progress Notes (Signed)
PROGRESS NOTE    [name]  QJJ:[id number] DOB: 1921-04-02 DOA: 11/12/2020 PCP: [name], MD    Assessment & Plan:   Principal Problem:   Hypokalemia Active Problems:   Hypertension   Depression   History of pulmonary embolus (PE)   Weakness   Unspecified atrial fibrillation (HCC)   Pressure injury of skin   Hypokalemia: KCl repleated. Mg level is WNL.  Hyponatremia: trending up today. Will continue to monitor   A. fib: new onset. Rate controlled. CHADS2-VASc score of 5 but pt already on eliquis for hx of DVT. Will restart eliquis tomorrow as pt had recent trauma w/ significant bleeding  Depression: severity unknown. Continue on home dose of duloxetine  Hiatal hernia w/ GERD: continue on PPI   Thrombocytosis:  etiology unclear, likely reactive. Will continue to monitor   DVT prophylaxis: SCDs Code Status: DNR Family Communication: discussed pt's care w/ pt's daughter, [name], and answered her questions  Disposition Plan: likely d/c back to [facility]   Level of care: Med-Surg  Status is: Observation  The patient remains OBS appropriate and will d/c before 2 midnights.  Dispo: The patient is from: SNF              Anticipated d/c is to: SNF              Patient currently is not medically stable to d/c.   Difficult to place patient : unclear     Consultants:    Procedures:   Antimicrobials:   Subjective: Pt c/o malaise   Objective: Vitals:   11/12/20 2322 11/13/20 0036 11/13/20 0129 11/13/20 0544  BP: (!) 145/86 127/69 121/68 (!) 148/71  Pulse: 75 78 68 62  Resp: 18 20 20 16   Temp:  98.3 F (36.8 C) 97.8 F (36.6 C) 97.6 F (36.4 C)  TempSrc:  Oral Oral Oral  SpO2: 98% 99% 98% 97%  Weight:   62.9 kg   Height:   5\' 7"  (1.702 m)     Intake/Output Summary (Last 24 hours) at 11/13/2020 0725 Last data filed at 11/13/2020 0505 Gross per 24 hour  Intake 1185.64 ml  Output 150 ml  Net 1035.64 ml   Filed Weights   11/12/20 1915 11/12/20 1929  11/13/20 0129  Weight: 62.6 kg 62.6 kg 62.9 kg    Examination:  General exam: Appears calm and comfortable  Respiratory system: Clear to auscultation. Respiratory effort normal. Cardiovascular system: irregularly irregular. No rubs, gallops or clicks. Gastrointestinal system: Abdomen is nondistended, soft and nontender. Normal bowel sounds heard. Central nervous system: Alert and awake. Moves all extremities  Psychiatry: Judgement and insight appear normal. Flat mood and affect     Data Reviewed: I have personally reviewed following labs and imaging studies  CBC: Recent Labs  Lab 11/12/20 1936 11/13/20 0510  WBC 12.8* 9.1  HGB 12.1 11.5*  HCT 37.4 34.2*  MCV 83.5 81.4  PLT 472* 481*   Basic Metabolic Panel: Recent Labs  Lab 11/12/20 1936 11/13/20 0510  NA 130* 131*  K 2.7* 3.4*  CL 95* 98  CO2 26 27  GLUCOSE 267* 123*  BUN 11 11  CREATININE 0.82 0.42*  CALCIUM 8.4* 8.3*  MG 1.7  --    GFR: Estimated Creatinine Clearance: 39.1 mL/min (A) (by C-G formula based on SCr of 0.42 mg/dL (L)). Liver Function Tests: No results for input(s): AST, ALT, ALKPHOS, BILITOT, PROT, ALBUMIN in the last 168 hours. No results for input(s): LIPASE, AMYLASE in the last 168  hours. No results for input(s): AMMONIA in the last 168 hours. Coagulation Profile: No results for input(s): INR, PROTIME in the last 168 hours. Cardiac Enzymes: No results for input(s): CKTOTAL, CKMB, CKMBINDEX, TROPONINI in the last 168 hours. BNP (last 3 results) No results for input(s): PROBNP in the last 8760 hours. HbA1C: No results for input(s): HGBA1C in the last 72 hours. CBG: No results for input(s): GLUCAP in the last 168 hours. Lipid Profile: No results for input(s): CHOL, HDL, LDLCALC, TRIG, CHOLHDL, LDLDIRECT in the last 72 hours. Thyroid Function Tests: No results for input(s): TSH, T4TOTAL, FREET4, T3FREE, THYROIDAB in the last 72 hours. Anemia Panel: No results for input(s): VITAMINB12,  FOLATE, FERRITIN, TIBC, IRON, RETICCTPCT in the last 72 hours. Sepsis Labs: No results for input(s): PROCALCITON, LATICACIDVEN in the last 168 hours.  No results found for this or any previous visit (from the past 240 hour(s)).       Radiology Studies: DG Tibia/Fibula Left  Result Date: 11/12/2020 CLINICAL DATA:  Fall with soft tissue injury leg laceration EXAM: LEFT TIBIA AND FIBULA - 2 VIEW COMPARISON:  None. FINDINGS: No fracture or malalignment. Vascular calcifications. Small gas in the soft tissues on the lateral side of the proximal lower leg likely corresponding to history of laceration. No radiopaque foreign body is seen IMPRESSION: No acute osseous abnormality.  Negative for radiopaque foreign body. Electronically Signed   By: Donavan Foil M.D.   On: 11/12/2020 22:19        Scheduled Meds:  acidophilus  1 capsule Oral Q breakfast   vitamin C  1,000 mg Oral Daily   cholecalciferol  2,000 Units Oral Daily   DULoxetine  20 mg Oral Daily   melatonin  5 mg Oral QHS   metoprolol succinate  25 mg Oral Q breakfast   pantoprazole  40 mg Oral BID AC   vitamin B-12  1,000 mcg Oral Daily   Continuous Infusions:  0.9 % NaCl with KCl 40 mEq / L 75 mL/hr at 11/13/20 0505     LOS: 0 days    Time spent: 30 mins    Wyvonnia Dusky, MD [facility] Pager [phone]  If 7PM-7AM, please contact night-coverage 11/13/2020, 7:25 AM

## 2020-11-13 NOTE — Evaluation (Signed)
Occupational Therapy Evaluation Patient Details Name: [name] MRN: [id number] DOB: February 15, 1921 Today's Date: 11/13/2020    History of Present Illness [name] is a 87 y.o. female with medical history significant for hypertension, history of breast cancer, history of hiatal hernia and GERD who was brought into the ER for evaluation of weakness and left leg laceration.  Patient's son states that over the last couple of days she has been increasingly weak and has been wheelchair-bound.  At baseline she is able to ambulate with use of an assist device.  He states that she usually has low potassium levels which make her very weak.  Patient was being assisted to her bed by one of the healthcare workers and hit her leg on the wheelchair resulting in a huge laceration with significant bleeding so she was brought to the ER for evaluation.  Left leg laceration was sutured in the ED patient was found to have a potassium level of 2.7. Of note: pt with recent admission d/t DVT/PE.   Clinical Impression   Pt seen for OT evaluation this date in setting of acute hospitalization d/t LE laceration. Pt found to be hyperkalemic and admitted. Pt also on isolation for COVID. Pt presents this date with confusion, generalized weakness and deconditioning impacting her ability to safely and efficiently perform ADLs. Pt with family assistance at baseline, but she is poor historian, unable to describe magnitude of care provided. Pt requires MOD A for sup<>sit and rolling this date. Standing deferred as pt perseverates on L wrist pain (RN notified). Pt noted to have episode of bowel incontinence and requires MAX A for bed level LB bathing and peri care and requires MIN/MOD A for UB bathing/dressing with cues to sequence. Pt left with all needs met and in reach, RN presenting to room to assess pt and notified she needs new pure-wick as old one was soiled. Will continue to follow. Anticipate pt will require STR services upon  d/c to improve strength for ADLs/ADL mobility and reduce caregiver burden.     Follow Up Recommendations  SNF    Equipment Recommendations  Other (comment) (defer to next level of care)    Recommendations for Other Services       Precautions / Restrictions Precautions Precautions: Fall Restrictions Weight Bearing Restrictions: No      Mobility Bed Mobility Overal bed mobility: Needs Assistance Bed Mobility: Rolling;Supine to Sit;Sit to Supine Rolling: Mod assist   Supine to sit: Mod assist Sit to supine: Mod assist   General bed mobility comments: cues for safety/sequence    Transfers                      Balance Overall balance assessment: Needs assistance Sitting-balance support: Feet supported;Bilateral upper extremity supported Sitting balance-Leahy Scale: Fair         Standing balance comment: deferred                           ADL either performed or assessed with clinical judgement   ADL Overall ADL's : Needs assistance/impaired                                       General ADL Comments: MIN A for seated UB ADLs, MAX A for bed level LB ADLs. MOD A for bed mobility sup to sit and rolling.  Vision Patient Visual Report: No change from baseline       Perception     Praxis      Pertinent Vitals/Pain Pain Assessment: Faces Faces Pain Scale: Hurts even more Pain Location: L wrist (noted to be slightly red and swollen, RN notified and comes in to assess during session) Pain Descriptors / Indicators: Tender;Sore Pain Intervention(s): Limited activity within patient's tolerance;Monitored during session     Hand Dominance Right   Extremity/Trunk Assessment Upper Extremity Assessment Upper Extremity Assessment: Generalized weakness;LUE deficits/detail LUE Deficits / Details: L wrist pain, noted to be slightly swollen, decreased ROM tolerance. RN notified.   Lower Extremity Assessment Lower Extremity  Assessment: Generalized weakness       Communication Communication Communication: No difficulties;HOH   Cognition Arousal/Alertness: Awake/alert Behavior During Therapy: WFL for tasks assessed/performed;Flat affect Overall Cognitive Status: No family/caregiver present to determine baseline cognitive functioning                                 General Comments: Pt able to follow most simple one step commands with increased time to process. Only oriented to self. Decreased awareness including awsareness of bowel/bladder needs. She is incontinent, but also not aware when wet.   General Comments       Exercises     Shoulder Instructions      Home Living Family/patient expects to be discharged to:: Skilled nursing facility                                        Prior Functioning/Environment          Comments: Chart indicates pt is from home with family assistance, pt is confused and poor historian. Pt's previous therapy evaluations indicate that she has help from her daughter for bathing and meal prep and used rollator for fxl mobility. To this Pryor Curia, pt reports she lives alone which chart indicates is inaccurate.        OT Problem List: Decreased strength;Decreased range of motion;Decreased activity tolerance;Impaired balance (sitting and/or standing);Decreased cognition;Decreased safety awareness;Decreased knowledge of use of DME or AE      OT Treatment/Interventions: Self-care/ADL training;DME and/or AE instruction;Therapeutic activities;Balance training;Therapeutic exercise;Energy conservation;Patient/family education    OT Goals(Current goals can be found in the care plan section) Acute Rehab OT Goals Patient Stated Goal: none stated OT Goal Formulation: Patient unable to participate in goal setting Time For Goal Achievement: 11/27/20 Potential to Achieve Goals: Fair ADL Goals Pt Will Perform Upper Body Dressing: with set-up;sitting (with  G static sitting balance to decrease fall risk) Pt Will Transfer to Toilet: with min assist;with mod assist;stand pivot transfer;bedside commode  OT Frequency: Min 1X/week   Barriers to D/C:            Co-evaluation              AM-PAC OT "6 Clicks" Daily Activity     Outcome Measure Help from another person eating meals?: A Little Help from another person taking care of personal grooming?: A Little Help from another person toileting, which includes using toliet, bedpan, or urinal?: A Lot Help from another person bathing (including washing, rinsing, drying)?: A Lot Help from another person to put on and taking off regular upper body clothing?: A Lot Help from another person to put on and taking off regular lower body  clothing?: A Lot 6 Click Score: 14   End of Session Nurse Communication: Mobility status;Other (comment) (wrist pain, need for clean pure wick)  Activity Tolerance: Patient limited by pain Patient left: in bed;with call bell/phone within reach;with bed alarm set;with nursing/sitter in room  OT Visit Diagnosis: Unsteadiness on feet (R26.81);Muscle weakness (generalized) (M62.81)                Time: 7847-8412 OT Time Calculation (min): 34 min Charges:  OT General Charges $OT Visit: 1 Visit OT Evaluation $OT Eval Moderate Complexity: 1 Mod OT Treatments $Self Care/Home Management : 8-22 mins $Therapeutic Activity: 8-22 mins  Gerrianne Scale, MS, OTR/L ascom [phone] 11/13/20, 4:37 PM

## 2020-11-13 NOTE — Progress Notes (Signed)
Initial Nutrition Assessment  DOCUMENTATION CODES:   Not applicable  INTERVENTION:   Ensure Enlive po BID, each supplement provides 350 kcal and 20 grams of protein  Magic cup TID with meals, each supplement provides 290 kcal and 9 grams of protein  MVI po daily   Liberalize diet  Pt at high refeed risk; recommend monitor potassium, magnesium and phosphorus labs daily until stable  NUTRITION DIAGNOSIS:   Increased nutrient needs related to catabolic illness (recent COVID 19, wound healing) as evidenced by estimated needs.  GOAL:   Patient will meet greater than or equal to 90% of their needs  MONITOR:   PO intake, Supplement acceptance, Labs, Weight trends, Skin, I & O's  REASON FOR ASSESSMENT:   Malnutrition Screening Tool    ASSESSMENT:   87 y.o. female with medical history significant for hypertension, colon cancer, breast cancer s/p mastectomy, hiatal hernia, GERD, depression, DVT, PUD and recent COVID 19 who was brought into the ER for evaluation of weakness and left leg laceration and was found to have new atrial fib,  RD working remotely.  Unable to reach pt by phone. Suspect pt with poor appetite and oral intake pta r/t COVID 19. Per chart, pt is down 16lbs(11%) over the past 3 months; this is significant weight loss. RD will add supplements and MVI to help pt meet her estimated needs. RD will also liberalize pt's diet as pt is not eating enough to exceed nutrient limits. Pt is likely at refeed risk. RD will obtain nutrition related history and exam at follow-up.   Medications reviewed and include: risaquad, vitamin C, D3, protonix, B12  Labs reviewed: Na 131(L), K 3.4(L), creat 0.42(L), Mg 2.1 wnl  NUTRITION - FOCUSED PHYSICAL EXAM: Unable to perform at this time   Diet Order:   Diet Order             Diet 2 gram sodium Room service appropriate? No; Fluid consistency: Thin  Diet effective now                  EDUCATION NEEDS:   No education  needs have been identified at this time  Skin:  Skin Assessment: Reviewed RN Assessment (Stage I buttocks, pre-tibial wound)  Last BM:  6/18  Height:   Ht Readings from Last 1 Encounters:  11/13/20 5\' 7"  (1.702 m)    Weight:   Wt Readings from Last 1 Encounters:  11/13/20 62.9 kg    Ideal Body Weight:  61.36 kg  BMI:  Body mass index is 21.72 kg/m.  Estimated Nutritional Needs:   Kcal:  1500-1700kcal/day  Protein:  75-85g/day  Fluid:  1.5-1.7L/day  Koleen Distance MS, RD, LDN Please refer to [facility] for RD and/or RD on-call/weekend/after hours pager

## 2020-11-13 NOTE — Progress Notes (Signed)
Received patient from the ED, transported via stretcher. Pt is aox3, denies any pain. VSS. Placed on cardiac monitor. Pt has a laceration on left leg which was sutured in ED, left leg is covered with kerlix CDI. IVF started. Fluids offered. Call bell placed within reach. Bed alarm activated. Will monitor.

## 2020-11-14 DIAGNOSIS — D75839 Thrombocytosis, unspecified: Secondary | ICD-10-CM | POA: Diagnosis not present

## 2020-11-14 DIAGNOSIS — D649 Anemia, unspecified: Secondary | ICD-10-CM

## 2020-11-14 DIAGNOSIS — I4891 Unspecified atrial fibrillation: Secondary | ICD-10-CM

## 2020-11-14 LAB — BASIC METABOLIC PANEL
Anion gap: 7 (ref 5–15)
BUN: 8 mg/dL (ref 8–23)
CO2: 27 mmol/L (ref 22–32)
Calcium: 8.4 mg/dL — ABNORMAL LOW (ref 8.9–10.3)
Chloride: 101 mmol/L (ref 98–111)
Creatinine, Ser: <0.3 mg/dL — ABNORMAL LOW (ref 0.44–1.00)
Glucose, Bld: 139 mg/dL — ABNORMAL HIGH (ref 70–99)
Potassium: 3.8 mmol/L (ref 3.5–5.1)
Sodium: 135 mmol/L (ref 135–145)

## 2020-11-14 LAB — MAGNESIUM: Magnesium: 1.8 mg/dL (ref 1.7–2.4)

## 2020-11-14 LAB — CBC
HCT: 33.8 % — ABNORMAL LOW (ref 36.0–46.0)
Hemoglobin: 11.2 g/dL — ABNORMAL LOW (ref 12.0–15.0)
MCH: 27.1 pg (ref 26.0–34.0)
MCHC: 33.1 g/dL (ref 30.0–36.0)
MCV: 81.6 fL (ref 80.0–100.0)
Platelets: 453 10*3/uL — ABNORMAL HIGH (ref 150–400)
RBC: 4.14 MIL/uL (ref 3.87–5.11)
RDW: 16.2 % — ABNORMAL HIGH (ref 11.5–15.5)
WBC: 8.1 10*3/uL (ref 4.0–10.5)
nRBC: 0 % (ref 0.0–0.2)

## 2020-11-14 MED ORDER — APIXABAN 5 MG PO TABS
5.0000 mg | ORAL_TABLET | Freq: Two times a day (BID) | ORAL | Status: DC
  Administered 2020-11-14 – 2020-11-19 (×11): 5 mg via ORAL
  Filled 2020-11-14 (×11): qty 1

## 2020-11-14 NOTE — Progress Notes (Signed)
PROGRESS NOTE    [name]  QPR:[id number] DOB: 1920/08/25 DOA: 11/12/2020 PCP: [name], MD    Assessment & Plan:   Principal Problem:   Hypokalemia Active Problems:   Hypertension   Depression   History of pulmonary embolus (PE)   Weakness   Unspecified atrial fibrillation (HCC)   Pressure injury of skin   Hypokalemia: WNL today.   Hyponatremia: WNL today   A. fib: new onset. Rate controlled. Continue on home dose of eliquis   Depression: severity unknown. Continue on home dose of duloxetine   Hiatal hernia w/ GERD: continue on PPI   Thrombocytosis: likely reactive. Etiology unclear.   Normocytic anemia: H&H are stable/   DVT prophylaxis: SCDs Code Status: DNR Family Communication: discussed pt's care w/ pt's daughter, [name], and answered her questions  Disposition Plan: likely d/c back to [facility]   Level of care: Med-Surg  Status is: Observation  The patient remains OBS appropriate and will d/c before 2 midnights.  Dispo: The patient is from: SNF              Anticipated d/c is to: SNF              Patient currently is not medically stable to d/c.   Difficult to place patient : unclear     Consultants:    Procedures:   Antimicrobials:   Subjective: Pt c/o pain   Objective: Vitals:   11/13/20 1512 11/13/20 2148 11/14/20 0131 11/14/20 0612  BP: (!) 156/72 (!) 165/81 (!) 161/93 (!) 176/80  Pulse: 67 76 80 67  Resp: (!) 22 16 17 15   Temp: 98.4 F (36.9 C) 98 F (36.7 C) (!) 97.4 F (36.3 C) (!) 97.5 F (36.4 C)  TempSrc:  Oral Oral   SpO2: 100% 98% 98% 96%  Weight:      Height:        Intake/Output Summary (Last 24 hours) at 11/14/2020 0726 Last data filed at 11/14/2020 9935 Gross per 24 hour  Intake 625.74 ml  Output 550 ml  Net 75.74 ml   Filed Weights   11/12/20 1915 11/12/20 1929 11/13/20 0129  Weight: 62.6 kg 62.6 kg 62.9 kg    Examination:  General exam: Appears comfortable   Respiratory system: clear breath  sounds b/l  Cardiovascular system: irregularly irregular. No gallops or rubs  Gastrointestinal system: Abd is soft, NT,ND & hypoactive bowel sounds  Central nervous system: Alert and awake. Moves all extremities  Psychiatry: judgement and insight appear abnormal. Flat mood and affect     Data Reviewed: I have personally reviewed following labs and imaging studies  CBC: Recent Labs  Lab 11/12/20 1936 11/13/20 0510 11/14/20 0557  WBC 12.8* 9.1 8.1  HGB 12.1 11.5* 11.2*  HCT 37.4 34.2* 33.8*  MCV 83.5 81.4 81.6  PLT 472* 454* 701*   Basic Metabolic Panel: Recent Labs  Lab 11/12/20 1936 11/13/20 0510 11/14/20 0557  NA 130* 131* 135  K 2.7* 3.4* 3.8  CL 95* 98 101  CO2 26 27 27   GLUCOSE 267* 123* 139*  BUN 11 11 8   CREATININE 0.82 0.42* <0.30*  CALCIUM 8.4* 8.3* 8.4*  MG 1.7 2.1 1.8   GFR: CrCl cannot be calculated (This lab value cannot be used to calculate CrCl because it is not a number: <0.30). Liver Function Tests: No results for input(s): AST, ALT, ALKPHOS, BILITOT, PROT, ALBUMIN in the last 168 hours. No results for input(s): LIPASE, AMYLASE in the last 168 hours. No  results for input(s): AMMONIA in the last 168 hours. Coagulation Profile: No results for input(s): INR, PROTIME in the last 168 hours. Cardiac Enzymes: No results for input(s): CKTOTAL, CKMB, CKMBINDEX, TROPONINI in the last 168 hours. BNP (last 3 results) No results for input(s): PROBNP in the last 8760 hours. HbA1C: No results for input(s): HGBA1C in the last 72 hours. CBG: No results for input(s): GLUCAP in the last 168 hours. Lipid Profile: No results for input(s): CHOL, HDL, LDLCALC, TRIG, CHOLHDL, LDLDIRECT in the last 72 hours. Thyroid Function Tests: No results for input(s): TSH, T4TOTAL, FREET4, T3FREE, THYROIDAB in the last 72 hours. Anemia Panel: No results for input(s): VITAMINB12, FOLATE, FERRITIN, TIBC, IRON, RETICCTPCT in the last 72 hours. Sepsis Labs: No results for  input(s): PROCALCITON, LATICACIDVEN in the last 168 hours.  No results found for this or any previous visit (from the past 240 hour(s)).       Radiology Studies: DG Wrist Complete Left  Result Date: 11/13/2020 CLINICAL DATA:  Left wrist pain and swelling. EXAM: LEFT WRIST - COMPLETE 3+ VIEW COMPARISON:  None. FINDINGS: There is no evidence of fracture or dislocation. Radiocarpal osteoarthritis with joint space narrowing and radial spurring. Spurring of the distal radioulnar joint. Degenerative cystic change throughout the carpal bones. Mild osteoarthritis of the thumb at the metacarpal carpal joint. No erosion or bone destruction. Mild generalized soft tissue edema. No soft tissue air. No radiopaque foreign body. IMPRESSION: 1. Soft tissue edema about the wrist. 2. 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 3. No acute osseous abnormalities. No evidence of inflammatory arthropathy. Electronically Signed   By: Keith Rake M.D.   On: 11/13/2020 16:44   DG Tibia/Fibula Left  Result Date: 11/12/2020 CLINICAL DATA:  Fall with soft tissue injury leg laceration EXAM: LEFT TIBIA AND FIBULA - 2 VIEW COMPARISON:  None. FINDINGS: No fracture or malalignment. Vascular calcifications. Small gas in the soft tissues on the lateral side of the proximal lower leg likely corresponding to history of laceration. No radiopaque foreign body is seen IMPRESSION: No acute osseous abnormality.  Negative for radiopaque foreign body. Electronically Signed   By: Donavan Foil M.D.   On: 11/12/2020 22:19        Scheduled Meds:  acidophilus  1 capsule Oral Q breakfast   apixaban  5 mg Oral BID   vitamin C  1,000 mg Oral Daily   cholecalciferol  2,000 Units Oral Daily   DULoxetine  20 mg Oral Daily   feeding supplement  237 mL Oral BID BM   melatonin  5 mg Oral QHS   metoprolol succinate  25 mg Oral Q breakfast   multivitamin with minerals  1 tablet Oral Daily   pantoprazole  40 mg Oral BID AC    vitamin B-12  1,000 mcg Oral Daily   Continuous Infusions:     LOS: 0 days    Time spent: 25 mins    Wyvonnia Dusky, MD [facility] Pager [phone]  If 7PM-7AM, please contact night-coverage 11/14/2020, 7:26 AM

## 2020-11-14 NOTE — TOC Progression Note (Signed)
Transition of Care [facility]) - Progression Note    Patient Details  Name: [name] MRN: [id number] Date of Birth: 1920-12-21  Transition of Care [facility]) CM/SW Contact  Izola Price, RN Phone Number: 11/14/2020, 10:26 AM  Clinical Narrative:  6/19: Admit 6/17 FROM [facility] with post Covid progressive weakness, poor appetite, becoming wheelchair dependent. 2 week ago hosp for bleeding PUD. Came with laceration of Left leg which was sutured. Palliative consult on 11/10/20 at [facility]. See Note.   POA, Daughter, Anibal Henderson:  [phone] H [phone]. C Three sons.   PCP Dr. [name] at [facility].   [facility] [place], [place] RN CM       Expected Discharge Plan and Services                                                 Social Determinants of Health (SDOH) Interventions    Readmission Risk Interventions Readmission Risk Prevention Plan 08/30/2020 08/24/2020  Transportation Screening Complete Complete  PCP or Specialist Appt within 5-7 Days - Complete  PCP or Specialist Appt within 3-5 Days (No Data) -  Home Care Screening - Complete  Medication Review (RN CM) - Complete  HRI or Home Care Consult (No Data) -  Palliative Care Screening Complete -  Medication Review (RN Care Manager) Complete -  Some recent data might be hidden

## 2020-11-14 NOTE — Evaluation (Addendum)
Physical Therapy Evaluation Patient Details Name: [name] MRN: [id number] DOB: Oct 07, 1920 Today's Date: 11/14/2020   History of Present Illness  [name] is a 87 y.o. female with medical history significant for hypertension, history of breast cancer, history of hiatal hernia and GERD who was brought into the ER for evaluation of weakness and left leg laceration.  Patient's son states that over the last couple of days she has been increasingly weak and has been wheelchair-bound.  At baseline she is able to ambulate with use of an assist device.  He states that she usually has low potassium levels which make her very weak.  Patient was being assisted to her bed by one of the healthcare workers and hit her leg on the wheelchair resulting in a huge laceration with significant bleeding so she was brought to the ER for evaluation.  Left leg laceration was sutured in the ED patient was found to have a potassium level of 2.7. Of note: pt with recent admission d/t DVT/PE.  Clinical Impression  Pt seen for PT evaluation with pt received asleep in bed with breakfast tray untouched. Pt easily awakened & agreeable to tx. Pt requires Max assist to transfer to sitting EOB but pt unable to scoot completely to EOB & requires max assist. Initiated sit>stand but pt found to be incontinent of BM & having another incontinent BM so pt returned to supine. Pt rolls L<>R with supervision & bed rails to allow PT to perform peri hygiene & linen change. Will continue to follow pt acutely to progress transfers as able. Pt will require STR upon d/c to maximize independence with mobility, reduce fall risk, & decrease caregiver burden.     Follow Up Recommendations SNF    Equipment Recommendations   (TBD in next venue)    Recommendations for Other Services       Precautions / Restrictions Precautions Precautions: Fall Restrictions Weight Bearing Restrictions: No      Mobility  Bed Mobility Overal bed  mobility: Needs Assistance Bed Mobility: Rolling;Supine to Sit;Sit to Supine Rolling: Supervision (bed rails)   Supine to sit: Mod assist;HOB elevated Sit to supine: Min guard;HOB elevated   General bed mobility comments: assistance to move BLE to EOB for supine>sit; total assist to scoot to [facility] [employer] with bed in trendelenburg position    Transfers Overall transfer level:  (attempted sit>stand but unable to achieve 2/2 incontinent BM)                  Ambulation/Gait                Stairs            Wheelchair Mobility    Modified Rankin (Stroke Patients Only)       Balance Overall balance assessment: Needs assistance Sitting-balance support: Feet supported;Bilateral upper extremity supported Sitting balance-Leahy Scale: Fair Sitting balance - Comments: close supervision<>CGA static sitting EOB                                     Pertinent Vitals/Pain Pain Assessment: Faces Faces Pain Scale: Hurts a little bit Pain Location: burning in peri area, pain in R wrist Pain Descriptors / Indicators: Burning;Sore Pain Intervention(s): Limited activity within patient's tolerance;Monitored during session;Repositioned (cleaned peri area)    Home Living Family/patient expects to be discharged to:: Skilled nursing facility  Prior Function Level of Independence: Independent with assistive device(s);Needs assistance         Comments: Chart with differing PLOF information. Pt apparently from SNF but also read that pt is from home, where she lives alone with daughter assisting with some tasks.      Hand Dominance        Extremity/Trunk Assessment   Upper Extremity Assessment Upper Extremity Assessment: Generalized weakness LUE Deficits / Details: L wrist pain, noted to be slightly swollen, decreased ROM tolerance. Pt also with c/o pain in R elbow.    Lower Extremity Assessment Lower Extremity Assessment:  Generalized weakness       Communication   Communication: No difficulties;HOH  Cognition Arousal/Alertness: Awake/alert Behavior During Therapy: WFL for tasks assessed/performed;Flat affect Overall Cognitive Status: No family/caregiver present to determine baseline cognitive functioning                                 General Comments: Pt able to follow most simple one step commands with increased time to process. Only oriented to self. Pt aware of inconinent BM during session but unaware of being soiled upon PT arrival.      General Comments General comments (skin integrity, edema, etc.): pt on room air, lowest SPO2 reading of 81% observed but quickly returned to >90%.    Exercises     Assessment/Plan    PT Assessment Patient needs continued PT services  PT Problem List Decreased strength;Decreased mobility;Decreased safety awareness;Decreased range of motion;Decreased activity tolerance;Decreased cognition;Decreased balance;Decreased skin integrity;Decreased knowledge of use of DME;Pain       PT Treatment Interventions DME instruction;Therapeutic exercise;Wheelchair mobility training;Gait training;Balance training;Manual techniques;Stair training;Neuromuscular re-education;Modalities;Functional mobility training;Cognitive remediation;Therapeutic activities;Patient/family education    PT Goals (Current goals can be found in the Care Plan section)  Acute Rehab PT Goals Patient Stated Goal: none stated PT Goal Formulation: With patient    Frequency Min 2X/week   Barriers to discharge        Co-evaluation               AM-PAC PT "6 Clicks" Mobility  Outcome Measure Help needed turning from your back to your side while in a flat bed without using bedrails?: None Help needed moving from lying on your back to sitting on the side of a flat bed without using bedrails?: A Lot Help needed moving to and from a bed to a chair (including a wheelchair)?:  Total Help needed standing up from a chair using your arms (e.g., wheelchair or bedside chair)?: Total Help needed to walk in hospital room?: Total Help needed climbing 3-5 steps with a railing? : Total 6 Click Score: 10    End of Session   Activity Tolerance: Patient tolerated treatment well Patient left: in bed;with bed alarm set;with call bell/phone within reach   PT Visit Diagnosis: Difficulty in walking, not elsewhere classified (R26.2);Muscle weakness (generalized) (M62.81)    Time: 5427-0623 PT Time Calculation (min) (ACUTE ONLY): 31 min   Charges:   PT Evaluation $PT Eval Low Complexity: 1 Low PT Treatments $Therapeutic Activity: 8-22 mins        Lavone Nian, PT, DPT 11/14/20, 12:04 PM   Waunita Schooner 11/14/2020, 11:59 AM

## 2020-11-15 DIAGNOSIS — I4891 Unspecified atrial fibrillation: Secondary | ICD-10-CM | POA: Diagnosis not present

## 2020-11-15 DIAGNOSIS — D75839 Thrombocytosis, unspecified: Secondary | ICD-10-CM | POA: Diagnosis not present

## 2020-11-15 DIAGNOSIS — D649 Anemia, unspecified: Secondary | ICD-10-CM | POA: Diagnosis not present

## 2020-11-15 LAB — MAGNESIUM: Magnesium: 1.8 mg/dL (ref 1.7–2.4)

## 2020-11-15 LAB — URINALYSIS, COMPLETE (UACMP) WITH MICROSCOPIC
Bilirubin Urine: NEGATIVE
Glucose, UA: NEGATIVE mg/dL
Hgb urine dipstick: NEGATIVE
Ketones, ur: NEGATIVE mg/dL
Nitrite: POSITIVE — AB
Protein, ur: NEGATIVE mg/dL
Specific Gravity, Urine: 1.015 (ref 1.005–1.030)
WBC, UA: >50 WBC/hpf — ABNORMAL HIGH (ref 0–5)
pH: 5 (ref 5.0–8.0)

## 2020-11-15 LAB — BASIC METABOLIC PANEL
Anion gap: 8 (ref 5–15)
BUN: 8 mg/dL (ref 8–23)
CO2: 27 mmol/L (ref 22–32)
Calcium: 8.5 mg/dL — ABNORMAL LOW (ref 8.9–10.3)
Chloride: 100 mmol/L (ref 98–111)
Creatinine, Ser: 0.47 mg/dL (ref 0.44–1.00)
GFR, Estimated: >60 mL/min (ref >60)
Glucose, Bld: 200 mg/dL — ABNORMAL HIGH (ref 70–99)
Potassium: 3.8 mmol/L (ref 3.5–5.1)
Sodium: 135 mmol/L (ref 135–145)

## 2020-11-15 LAB — URINE CULTURE: Culture: 30000 — AB

## 2020-11-15 LAB — CBC
HCT: 35.2 % — ABNORMAL LOW (ref 36.0–46.0)
Hemoglobin: 11.6 g/dL — ABNORMAL LOW (ref 12.0–15.0)
MCH: 27.2 pg (ref 26.0–34.0)
MCHC: 33 g/dL (ref 30.0–36.0)
MCV: 82.4 fL (ref 80.0–100.0)
Platelets: 498 10*3/uL — ABNORMAL HIGH (ref 150–400)
RBC: 4.27 MIL/uL (ref 3.87–5.11)
RDW: 16.6 % — ABNORMAL HIGH (ref 11.5–15.5)
WBC: 9.6 10*3/uL (ref 4.0–10.5)
nRBC: 0 % (ref 0.0–0.2)

## 2020-11-15 MED ORDER — FLUCONAZOLE 50 MG PO TABS
150.0000 mg | ORAL_TABLET | Freq: Once | ORAL | Status: AC
  Administered 2020-11-15: 150 mg via ORAL
  Filled 2020-11-15: qty 1

## 2020-11-15 MED ORDER — LOPERAMIDE HCL 2 MG PO CAPS
2.0000 mg | ORAL_CAPSULE | Freq: Once | ORAL | Status: AC
  Administered 2020-11-15: 2 mg via ORAL
  Filled 2020-11-15: qty 1

## 2020-11-15 NOTE — Discharge Summary (Addendum)
Physician Discharge Summary  [name] HKV:[id number] DOB: [birth year] DOA: 11/12/2020  PCP: [name], MD  Admit date: 11/12/2020 Discharge date: 11/16/20  Admitted From: SNF Disposition:  SNF  Recommendations for Outpatient Follow-up:  Follow up with PCP in 1-2 weeks   Home Health: no  Equipment/Devices:  Discharge Condition: stable  CODE STATUS: DNR Diet recommendation: Regular   Brief/Interim Summary: HPI was taken from Dr. Francine Graven:  [name] is a 87 y.o. female with medical history significant for hypertension, history of breast cancer, history of hiatal hernia and GERD who was brought into the ER for evaluation of weakness and left leg laceration. Patient's son states that over the last couple of days she has been increasingly weak and has been wheelchair-bound.  At baseline she is able to ambulate with use of an assist device.  He states that she usually has low potassium levels which make her very weak.  Patient was being assisted to her bed by one of the healthcare workers and hit her leg on the wheelchair resulting in a huge laceration with significant bleeding so she was brought to the ER for evaluation. Left leg laceration was sutured in the ED patient was found to have a potassium level of 2.7. She denies having any nausea, no vomiting, no diarrhea, no dizziness, no headache, no fever, no chills, no blurred vision, no focal deficits, no cough, no palpitations or diaphoresis. Labs show sodium 130, potassium 2.7, chloride 95, bicarb 26, glucose 267, BUN 11, creatinine 0.82, calcium 8.4, magnesium 1.7, white count 12.8, hemoglobin 12.1, hematocrit 37.4, MCV 83.5, RDW 16.1, platelet count 472 X-ray of the left tibia-fibula shows no acute osseous abnormality.  Negative for radiopaque foreign body. Twelve-lead EKG reviewed by me shows atrial fibrillation, right bundle branch block and T wave inversions in the lateral leads.         ED Course: Patient is an  87 year old female who was brought into the ER for evaluation of a left leg laceration and weakness.  Labs reveal potassium of 2.7. Patient will be referred to observation status for further evaluation.   Hospital course from Dr. Jimmye Norman 6/18-6/20/22: Pt was found to have symptomatic hypokalemia & hyponatremia for which the pt was treated w/ potassium supplements and IVFs.  Hyponatremia & hypokalemia have both resolved prior to d/c. Of note, pt was found to have new onset a. fib that has been rate controlled the entire time. Pt will continue on metoprolol and eliquis. Also, urine cx was taken and grew only a containment. Repeat urine cx was taken on day of d/c. Pt has been afebrile and normal WBC throughout hospital stay, unlikely pt has a UTI.    Discharge Diagnoses:  Principal Problem:   Hypokalemia Active Problems:   Hypertension   Depression   History of pulmonary embolus (PE)   Weakness   Unspecified atrial fibrillation (HCC)   Pressure injury of skin    Hypokalemia: within normal limits    Hyponatremia: within normal limits   A. fib: new onset. Rate controlled. Continue on home dose of eliquis & metoprolol   Depression: severity unknown. Continue on home dose of duloxetine    Hiatal hernia w/ GERD: continue on PPI    Thrombocytosis: likely reactive. Etiology unclear.   Normocytic anemia: H&H are stable  Discharge Instructions  Discharge Instructions     Diet - low sodium heart healthy   Complete by: As directed    Discharge instructions   Complete by: As directed  F/u w/ PCP in 1-2 weeks   Increase activity slowly   Complete by: As directed    No wound care   Complete by: As directed       Allergies as of 11/15/2020       Reactions   Codeine Nausea And Vomiting        Medication List     STOP taking these medications    Potassium 99 MG Tabs       TAKE these medications    albuterol 108 (90 Base) MCG/ACT inhaler Commonly known as: VENTOLIN  HFA Inhale 1-2 puffs into the lungs every 6 (six) hours as needed for wheezing or shortness of breath.   Cranberry 500 MG Caps Take by mouth daily.   DULoxetine 20 MG capsule Commonly known as: CYMBALTA Take 20 mg by mouth daily.   furosemide 20 MG tablet Commonly known as: LASIX Take 1 tablet (20 mg total) by mouth daily for 3 days.   loperamide 2 MG tablet Commonly known as: IMODIUM A-D Take 2 mg by mouth as needed for diarrhea or loose stools.   Melatonin 5 MG Caps Take 1 capsule by mouth.   metoprolol succinate 25 MG 24 hr tablet Commonly known as: TOPROL-XL Take 25 mg by mouth daily.   pantoprazole 40 MG tablet Commonly known as: PROTONIX Take 1 tablet (40 mg total) by mouth 2 (two) times daily before a meal.   potassium chloride 10 MEQ tablet Commonly known as: KLOR-CON Take 2 tablets (20 mEq total) by mouth daily for 4 days.   PROBIOTIC COLIC PO Take 1 Dose by mouth daily. doterra brands   vitamin B-12 1000 MCG tablet Commonly known as: CYANOCOBALAMIN Take 1,000 mcg by mouth daily.   vitamin C 1000 MG tablet Take 1,000 mg by mouth daily.   Vitamin D 50 MCG (2000 UT) Caps Take 1 capsule by mouth daily.       ASK your doctor about these medications    apixaban 5 MG Tabs tablet Commonly known as: ELIQUIS 10 mg BID for 3 more days then switch to 5 mg BID        Follow-up Information     [facility]. Schedule an appointment as soon as possible for a visit .   Contact information: [place] [address] [phone]                Allergies  Allergen Reactions   Codeine Nausea And Vomiting    Consultations:    Procedures/Studies: DG Wrist Complete Left  Result Date: 11/13/2020 CLINICAL DATA:  Left wrist pain and swelling. EXAM: LEFT WRIST - COMPLETE 3+ VIEW COMPARISON:  None. FINDINGS: There is no evidence of fracture or dislocation. Radiocarpal osteoarthritis with joint space narrowing and radial spurring.  Spurring of the distal radioulnar joint. Degenerative cystic change throughout the carpal bones. Mild osteoarthritis of the thumb at the metacarpal carpal joint. No erosion or bone destruction. Mild generalized soft tissue edema. No soft tissue air. No radiopaque foreign body. IMPRESSION: 1. Soft tissue edema about the wrist. 2. 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 3. No acute osseous abnormalities. No evidence of inflammatory arthropathy. Electronically Signed   By: Keith Rake M.D.   On: 11/13/2020 16:44   DG Tibia/Fibula Left  Result Date: 11/12/2020 CLINICAL DATA:  Fall with soft tissue injury leg laceration EXAM: LEFT TIBIA AND FIBULA - 2 VIEW COMPARISON:  None. FINDINGS: No fracture or malalignment. Vascular calcifications. Small gas  in the soft tissues on the lateral side of the proximal lower leg likely corresponding to history of laceration. No radiopaque foreign body is seen IMPRESSION: No acute osseous abnormality.  Negative for radiopaque foreign body. Electronically Signed   By: Donavan Foil M.D.   On: 11/12/2020 22:19   DG Chest Port 1 View  Result Date: 11/01/2020 CLINICAL DATA:  COVID-19 pneumonia EXAM: PORTABLE CHEST 1 VIEW COMPARISON:  08/31/2020 FINDINGS: Pulmonary insufflation is stable and symmetric. Interstitial thickening has developed within the mid lung zones bilaterally with band like opacities noted within the right mid lung zone suggestive of parenchymal scarring. No superimposed confluent pulmonary infiltrate. No pneumothorax or pleural effusion. Moderate hiatal hernia. Cardiac size within normal limits. The pulmonary vascularity is normal. No acute bone abnormality. IMPRESSION: Interstitial changes suggestive of interstitial fibrosis. Preserved pulmonary insufflation. Moderate hiatal hernia. Electronically Signed   By: Fidela Salisbury MD   On: 11/01/2020 01:22      Subjective: Pt c/o fatigue    Discharge Exam: Vitals:   11/15/20 0828 11/15/20  1141  BP: (!) 181/101 (!) 141/79  Pulse: 78 74  Resp: 18 18  Temp: 97.6 F (36.4 C) (!) 97.5 F (36.4 C)  SpO2: 96% 98%   Vitals:   11/15/20 0029 11/15/20 0610 11/15/20 0828 11/15/20 1141  BP: (!) 158/90 (!) 163/94 (!) 181/101 (!) 141/79  Pulse: 72 73 78 74  Resp: 16 16 18 18   Temp: 98.3 F (36.8 C) 98.4 F (36.9 C) 97.6 F (36.4 C) (!) 97.5 F (36.4 C)  TempSrc:  Oral    SpO2: 96% 97% 96% 98%  Weight:      Height:        General: Pt is alert, awake, not in acute distress Cardiovascular: S1/S2 +, no rubs, no gallops Respiratory: CTA bilaterally, no wheezing, no rhonchi Abdominal: Soft, NT, ND, bowel sounds + Extremities: no cyanosis    The results of significant diagnostics from this hospitalization (including imaging, microbiology, ancillary and laboratory) are listed below for reference.     Microbiology: Recent Results (from the past 240 hour(s))  Urine Culture     Status: Abnormal   Collection Time: 11/14/20  1:26 AM   Specimen: Urine, Random  Result Value Ref Range Status   Specimen Description   Final    URINE, RANDOM Performed at [facility], [address], [place], [place] [address]    Special Requests   Final    NONE Performed at [facility], [address] [place], [place] [address]    Culture (A)  Final    30,000 COLONIES/mL MULTIPLE SPECIES PRESENT, SUGGEST RECOLLECTION   Report Status 11/15/2020 FINAL  Final     Labs: BNP (last 3 results) Recent Labs    08/21/20 1628 08/25/20 0811 08/31/20 1430  BNP 320.0* 131.6* 39.7   Basic Metabolic Panel: Recent Labs  Lab 11/12/20 1936 11/13/20 0510 11/14/20 0557 11/15/20 0912  NA 130* 131* 135 135  K 2.7* 3.4* 3.8 3.8  CL 95* 98 101 100  CO2 26 27 27 27   GLUCOSE 267* 123* 139* 200*  BUN 11 11 8 8   CREATININE 0.82 0.42* <0.30* 0.47  CALCIUM 8.4* 8.3* 8.4* 8.5*  MG 1.7 2.1 1.8 1.8   Liver Function Tests: No results for input(s): AST, ALT, ALKPHOS, BILITOT, PROT,  ALBUMIN in the last 168 hours. No results for input(s): LIPASE, AMYLASE in the last 168 hours. No results for input(s): AMMONIA in the last 168 hours. CBC: Recent  Labs  Lab 11/12/20 1936 11/13/20 0510 11/14/20 0557 11/15/20 0912  WBC 12.8* 9.1 8.1 9.6  HGB 12.1 11.5* 11.2* 11.6*  HCT 37.4 34.2* 33.8* 35.2*  MCV 83.5 81.4 81.6 82.4  PLT 472* 454* 453* 498*   Cardiac Enzymes: No results for input(s): CKTOTAL, CKMB, CKMBINDEX, TROPONINI in the last 168 hours. BNP: Invalid input(s): POCBNP CBG: No results for input(s): GLUCAP in the last 168 hours. D-Dimer No results for input(s): DDIMER in the last 72 hours. Hgb A1c No results for input(s): HGBA1C in the last 72 hours. Lipid Profile No results for input(s): CHOL, HDL, LDLCALC, TRIG, CHOLHDL, LDLDIRECT in the last 72 hours. Thyroid function studies No results for input(s): TSH, T4TOTAL, T3FREE, THYROIDAB in the last 72 hours.  Invalid input(s): FREET3 Anemia work up No results for input(s): VITAMINB12, FOLATE, FERRITIN, TIBC, IRON, RETICCTPCT in the last 72 hours. Urinalysis    Component Value Date/Time   COLORURINE YELLOW (A) 10/23/2020 1818   APPEARANCEUR HAZY (A) 10/23/2020 1818   APPEARANCEUR Cloudy (A) 07/19/2015 1055   LABSPEC 1.009 10/23/2020 1818   LABSPEC 1.008 01/06/2012 1922   PHURINE 5.0 10/23/2020 1818   GLUCOSEU NEGATIVE 10/23/2020 1818   GLUCOSEU 150 mg/dL 01/06/2012 1922   HGBUR NEGATIVE 10/23/2020 1818   BILIRUBINUR NEGATIVE 10/23/2020 1818   BILIRUBINUR neg 08/04/2020 1446   BILIRUBINUR Negative 07/19/2015 1055   BILIRUBINUR Negative 01/06/2012 1922   KETONESUR NEGATIVE 10/23/2020 [place] 10/23/2020 1818   UROBILINOGEN 0.2 08/04/2020 1446   NITRITE NEGATIVE 10/23/2020 1818   LEUKOCYTESUR SMALL (A) 10/23/2020 1818   LEUKOCYTESUR Trace 01/06/2012 1922   Sepsis Labs Invalid input(s): PROCALCITONIN,  WBC,  LACTICIDVEN Microbiology Recent Results (from the past 240 hour(s))   Urine Culture     Status: Abnormal   Collection Time: 11/14/20  1:26 AM   Specimen: Urine, Random  Result Value Ref Range Status   Specimen Description   Final    URINE, RANDOM Performed at [facility], [address], [place], [place] [address]    Special Requests   Final    NONE Performed at [facility], [address] [place], [place] [address]    Culture (A)  Final    30,000 COLONIES/mL MULTIPLE SPECIES PRESENT, SUGGEST RECOLLECTION   Report Status 11/15/2020 FINAL  Final     Time coordinating discharge: Over 30 minutes  SIGNED:   Wyvonnia Dusky, MD  [facility] 11/15/2020, 1:31 PM Pager   If 7PM-7AM, please contact night-coverage

## 2020-11-15 NOTE — TOC Progression Note (Signed)
Transition of Care [facility]) - Progression Note    Patient Details  Name: [name] MRN: [id number] Date of Birth: Sep 15, 1920  Transition of Care [facility]) CM/SW [place], RN Phone Number: 11/15/2020, 3:31 PM  Clinical Narrative:   Patient scheduled for discharge today, however developed excruciating pain with urination per RN. Attending ordered another UA and postponed discharge until tomorrow. [facility] staff notified.         Expected Discharge Plan and Services           Expected Discharge Date: 11/15/20                                     Social Determinants of Health (SDOH) Interventions    Readmission Risk Interventions Readmission Risk Prevention Plan 08/30/2020 08/24/2020  Transportation Screening Complete Complete  PCP or Specialist Appt within 5-7 Days - Complete  PCP or Specialist Appt within 3-5 Days (No Data) -  Home Care Screening - Complete  Medication Review (RN CM) - Complete  HRI or Home Care Consult (No Data) -  Palliative Care Screening Complete -  Medication Review (RN Care Manager) Complete -  Some recent data might be hidden

## 2020-11-16 DIAGNOSIS — R531 Weakness: Secondary | ICD-10-CM | POA: Diagnosis not present

## 2020-11-16 DIAGNOSIS — I4891 Unspecified atrial fibrillation: Secondary | ICD-10-CM | POA: Diagnosis not present

## 2020-11-16 DIAGNOSIS — R627 Adult failure to thrive: Secondary | ICD-10-CM | POA: Diagnosis not present

## 2020-11-16 LAB — BASIC METABOLIC PANEL
Anion gap: 6 (ref 5–15)
BUN: 10 mg/dL (ref 8–23)
CO2: 29 mmol/L (ref 22–32)
Calcium: 8.6 mg/dL — ABNORMAL LOW (ref 8.9–10.3)
Chloride: 103 mmol/L (ref 98–111)
Creatinine, Ser: 0.45 mg/dL (ref 0.44–1.00)
GFR, Estimated: >60 mL/min (ref >60)
Glucose, Bld: 134 mg/dL — ABNORMAL HIGH (ref 70–99)
Potassium: 3.9 mmol/L (ref 3.5–5.1)
Sodium: 138 mmol/L (ref 135–145)

## 2020-11-16 LAB — CBC
HCT: 33.7 % — ABNORMAL LOW (ref 36.0–46.0)
Hemoglobin: 11.1 g/dL — ABNORMAL LOW (ref 12.0–15.0)
MCH: 27.1 pg (ref 26.0–34.0)
MCHC: 32.9 g/dL (ref 30.0–36.0)
MCV: 82.2 fL (ref 80.0–100.0)
Platelets: 496 10*3/uL — ABNORMAL HIGH (ref 150–400)
RBC: 4.1 MIL/uL (ref 3.87–5.11)
RDW: 16.5 % — ABNORMAL HIGH (ref 11.5–15.5)
WBC: 7.8 10*3/uL (ref 4.0–10.5)
nRBC: 0 % (ref 0.0–0.2)

## 2020-11-16 LAB — MAGNESIUM: Magnesium: 2.2 mg/dL (ref 1.7–2.4)

## 2020-11-16 NOTE — TOC Initial Note (Signed)
Transition of Care [facility]) - Initial/Assessment Note    Patient Details  Name: [name] MRN: [id number] Date of Birth: 1921-03-13  Transition of Care [facility]) CM/SW Contact:    Shelbie Hutching, RN Phone Number: 11/16/2020, 10:41 AM  Clinical Narrative:                 Patient placed under observation for weakness and hypokalemia.  Patient is from [facility] where she is long term care and her family pays weekly for her stay.  Patient will return to [facility] at discharge.  Patient today reports not feeling good and very weak.  MD ordering palliative consult.    Expected Discharge Plan: Skilled Nursing Facility Barriers to Discharge: Continued Medical Work up   Patient Goals and CMS Choice Patient states their goals for this hospitalization and ongoing recovery are:: Patient will go back to [facility] at discharge CMS Medicare.gov Compare Post Acute Care list provided to:: Patient Choice offered to / list presented to : Patient, Adult Children  Expected Discharge Plan and Services Expected Discharge Plan: [place]   Discharge Planning Services: CM Consult Post Acute Care Choice: [place] Living arrangements for the past 2 months: [place] Expected Discharge Date: 11/15/20               DME Arranged: N/A DME Agency: NA       HH Arranged: NA          Prior Living Arrangements/Services Living arrangements for the past 2 months: [place] Lives with:: Facility Resident Patient language and need for interpreter reviewed:: Yes Do you feel safe going back to the place where you live?: Yes      Need for Family Participation in Patient Care: Yes (Comment) (COVID- weakness) Care giver support system in place?: Yes (comment) (daughter)   Criminal Activity/Legal Involvement Pertinent to Current Situation/Hospitalization: No - Comment as needed  Activities of Daily Living Home Assistive Devices/Equipment: Environmental consultant  (specify type) ADL Screening (condition at time of admission) Patient's cognitive ability adequate to safely complete daily activities?: No Is the patient deaf or have difficulty hearing?: Yes Does the patient have difficulty seeing, even when wearing glasses/contacts?: No Does the patient have difficulty concentrating, remembering, or making decisions?: Yes Patient able to express need for assistance with ADLs?: Yes Does the patient have difficulty dressing or bathing?: Yes Independently performs ADLs?: No Communication: Independent Dressing (OT): Needs assistance Is this a change from baseline?: Pre-admission baseline Grooming: Needs assistance Is this a change from baseline?: Pre-admission baseline Feeding: Needs assistance Is this a change from baseline?: Pre-admission baseline Bathing: Needs assistance Is this a change from baseline?: Pre-admission baseline Toileting: Needs assistance Is this a change from baseline?: Pre-admission baseline In/Out Bed: Needs assistance Is this a change from baseline?: Pre-admission baseline Walks in Home: Needs assistance Is this a change from baseline?: Pre-admission baseline Does the patient have difficulty walking or climbing stairs?: Yes Weakness of Legs: Both Weakness of Arms/Hands: Both  Permission Sought/Granted Permission sought to share information with : Case Manager, Customer service manager, Family Supports Permission granted to share information with : Yes, Verbal Permission Granted  Share Information with NAME: Anibal Henderson  Permission granted to share info w AGENCY: [facility]  Permission granted to share info w Relationship: daughter     Emotional Assessment       Orientation: : Oriented to Self, Oriented to Place, Oriented to Situation Alcohol / Substance Use: Not Applicable Psych Involvement: No (  comment)  Admission diagnosis:  Hypokalemia [E87.6] Fall [W19.XXXA] Laceration of left lower extremity, initial encounter  [S81.812A] Patient Active Problem List   Diagnosis Date Noted   Fall 11/13/2020   Weakness 11/13/2020   Unspecified atrial fibrillation ([place]) 11/13/2020   Pressure injury of skin 11/13/2020   Knee pain, right 11/10/2020   History of pulmonary embolus (PE) 11/01/2020   COVID-19 virus infection 11/01/2020   Acute GI bleeding 10/31/2020   Severe sepsis with septic shock ([place]) 08/21/2020   Depression 42/35/3614   Acute metabolic encephalopathy 43/15/4008   Fall at home, initial encounter 08/21/2020   Pelvic ring fracture ([place]) 08/21/2020   Hypokalemia 08/21/2020   Hyperglycemia 08/21/2020   Functional diarrhea 07/29/2019   Open wound of left knee, leg, and ankle with complication 67/61/9509   Chest wall pain 10/08/2017   History of recurrent urinary tract infection 02/03/2017   Vaginal pessary in situ 02/03/2017   Iron deficiency anemia due to chronic blood loss 12/01/2016   Personal history of malignant neoplasm of breast 11/09/2016   GI bleed 08/21/2016   Hereditary and idiopathic peripheral neuropathy 09/09/2015   Malignant neoplasm of left female breast ([place]) 09/09/2015   Prolapse of vaginal wall with midline cystocele 08/10/2015   Incomplete bladder emptying 08/10/2015   Midline low back pain without sciatica 08/10/2015   Fecal incontinence 08/10/2015   Recurrent UTI 07/19/2015   Atrophic vaginitis 07/19/2015   Adenomatous colon polyp 02/24/2015   Anemia 02/24/2015   Chronic sinusitis 02/24/2015   Diverticulosis 02/24/2015   Esophagitis 02/24/2015   GERD (gastroesophageal reflux disease) 02/24/2015   Hiatal hernia 02/24/2015   Breast cancer, left breast ([place]) 09/05/2012   Hypertension    PCP:  [name], MD Pharmacy:   [facility] [place], [place] - [address] [place] [address] Phone: [phone] Fax: [phone]     Social Determinants of Health (SDOH)  Interventions    Readmission Risk Interventions Readmission Risk Prevention Plan 08/30/2020 08/24/2020  Transportation Screening Complete Complete  PCP or Specialist Appt within 5-7 Days - Complete  PCP or Specialist Appt within 3-5 Days (No Data) -  Home Care Screening - Complete  Medication Review (RN CM) - Complete  HRI or Home Care Consult (No Data) -  Palliative Care Screening Complete -  Medication Review (RN Care Manager) Complete -  Some recent data might be hidden

## 2020-11-16 NOTE — NC FL2 (Signed)
[place] LEVEL OF CARE SCREENING TOOL     IDENTIFICATION  Patient Name: [name] Birthdate: February 08, 1921 Sex: female Admission Date (Current Location): 11/12/2020  [facility] and [place] Number:  Engineering geologist and Address:  [facility], [address] [place], [place] [address]      Provider Number: [id number]  Attending Physician Name and Address:  Wyvonnia Dusky, MD  Relative Name and Phone Number:  Anibal Henderson [phone], Daughter and POA    Current Level of Care: Hospital Recommended Level of Care: [place] Prior Approval Number:    Date Approved/Denied:   PASRR Number: [id number]  Discharge Plan: SNF    Current Diagnoses: Patient Active Problem List   Diagnosis Date Noted   Fall 11/13/2020   Weakness 11/13/2020   Unspecified atrial fibrillation ([place]) 11/13/2020   Pressure injury of skin 11/13/2020   Knee pain, right 11/10/2020   History of pulmonary embolus (PE) 11/01/2020   COVID-19 virus infection 11/01/2020   Acute GI bleeding 10/31/2020   Severe sepsis with septic shock ([place]) 08/21/2020   Depression 47/01/6282   Acute metabolic encephalopathy 66/29/4765   Fall at home, initial encounter 08/21/2020   Pelvic ring fracture ([place]) 08/21/2020   Hypokalemia 08/21/2020   Hyperglycemia 08/21/2020   Functional diarrhea 07/29/2019   Open wound of left knee, leg, and ankle with complication 46/50/3546   Chest wall pain 10/08/2017   History of recurrent urinary tract infection 02/03/2017   Vaginal pessary in situ 02/03/2017   Iron deficiency anemia due to chronic blood loss 12/01/2016   Personal history of malignant neoplasm of breast 11/09/2016   GI bleed 08/21/2016   Hereditary and idiopathic peripheral neuropathy 09/09/2015   Malignant neoplasm of left female breast ([place]) 09/09/2015   Prolapse of vaginal wall with midline cystocele 08/10/2015   Incomplete bladder emptying 08/10/2015    Midline low back pain without sciatica 08/10/2015   Fecal incontinence 08/10/2015   Recurrent UTI 07/19/2015   Atrophic vaginitis 07/19/2015   Adenomatous colon polyp 02/24/2015   Anemia 02/24/2015   Chronic sinusitis 02/24/2015   Diverticulosis 02/24/2015   Esophagitis 02/24/2015   GERD (gastroesophageal reflux disease) 02/24/2015   Hiatal hernia 02/24/2015   Breast cancer, left breast ([place]) 09/05/2012   Hypertension     Orientation RESPIRATION BLADDER Height & Weight     Self  Normal External catheter Weight: 62.9 kg Height:  5\' 7"  (170.2 cm)  BEHAVIORAL SYMPTOMS/MOOD NEUROLOGICAL BOWEL NUTRITION STATUS      Continent Diet (see discharge summary)  AMBULATORY STATUS COMMUNICATION OF NEEDS Skin   Extensive Assist Verbally Normal                       Personal Care Assistance Level of Assistance  Bathing, Feeding, Dressing Bathing Assistance: Limited assistance Feeding assistance: Limited assistance Dressing Assistance: Limited assistance     Functional Limitations Info  Sight, Hearing, Speech Sight Info: Adequate Hearing Info: Impaired Speech Info: Adequate    SPECIAL CARE FACTORS FREQUENCY                       Contractures Contractures Info: Not present    Additional Factors Info  Code Status, Allergies Code Status Info: DNR Allergies Info: codeine           Current Medications (11/16/2020):  This is the current hospital active medication list Current Facility-Administered Medications  Medication Dose Route Frequency Provider Last Rate Last Admin  acetaminophen (TYLENOL) tablet 650 mg  650 mg Oral Q6H PRN Agbata, Tochukwu, MD   650 mg at 11/16/20 6440   Or   acetaminophen (TYLENOL) suppository 650 mg  650 mg Rectal Q6H PRN Agbata, Tochukwu, MD       acidophilus (RISAQUAD) capsule 1 capsule  1 capsule Oral Q breakfast Agbata, Tochukwu, MD   1 capsule at 11/16/20 0914   albuterol (PROVENTIL) (2.5 MG/3ML) 0.083% nebulizer solution 2.5 mg  2.5 mg  Nebulization Q6H PRN Belue, Alver Sorrow, RPH       apixaban (ELIQUIS) tablet 5 mg  5 mg Oral BID Wyvonnia Dusky, MD   5 mg at 11/16/20 3474   ascorbic acid (VITAMIN C) tablet 1,000 mg  1,000 mg Oral Daily Agbata, Tochukwu, MD   1,000 mg at 11/16/20 2595   cholecalciferol (VITAMIN D3) tablet 2,000 Units  2,000 Units Oral Daily Agbata, Tochukwu, MD   2,000 Units at 11/16/20 0914   DULoxetine (CYMBALTA) DR capsule 20 mg  20 mg Oral Daily Agbata, Tochukwu, MD   20 mg at 11/16/20 0917   feeding supplement (ENSURE ENLIVE / ENSURE PLUS) liquid 237 mL  237 mL Oral BID BM Wyvonnia Dusky, MD   237 mL at 11/16/20 0917   melatonin tablet 5 mg  5 mg Oral QHS Agbata, Tochukwu, MD   5 mg at 11/15/20 2036   metoprolol succinate (TOPROL-XL) 24 hr tablet 25 mg  25 mg Oral Q breakfast Agbata, Tochukwu, MD   25 mg at 11/16/20 6387   multivitamin with minerals tablet 1 tablet  1 tablet Oral Daily Wyvonnia Dusky, MD   1 tablet at 11/16/20 0915   ondansetron (ZOFRAN) tablet 4 mg  4 mg Oral Q6H PRN Agbata, Tochukwu, MD       Or   ondansetron (ZOFRAN) injection 4 mg  4 mg Intravenous Q6H PRN Agbata, Tochukwu, MD       pantoprazole (PROTONIX) EC tablet 40 mg  40 mg Oral BID AC Agbata, Tochukwu, MD   40 mg at 11/16/20 0915   vitamin B-12 (CYANOCOBALAMIN) tablet 1,000 mcg  1,000 mcg Oral Daily Agbata, Tochukwu, MD   1,000 mcg at 11/16/20 5643     Discharge Medications: Please see discharge summary for a list of discharge medications.  Relevant Imaging Results:  Relevant Lab Results:   Additional Information SS# [id number]  Shelbie Hutching, RN

## 2020-11-16 NOTE — TOC Progression Note (Signed)
Transition of Care [facility]) - Progression Note    Patient Details  Name: [name] MRN: [id number] Date of Birth: 04/28/1921  [facility] CM/SW Contact  Shelbie Hutching, RN Phone Number: 11/16/2020, 11:01 AM  Clinical Narrative:    Patient's daughter is interested in talking with palliative care team.  MD messaged about placing consult.    Expected Discharge Plan: Skilled Nursing Facility Barriers to Discharge: Continued Medical Work up  Expected Discharge Plan and Services Expected Discharge Plan: [place]   Discharge Planning Services: CM Consult Post Acute Care Choice: [place] Living arrangements for the past 2 months: [place] Expected Discharge Date: 11/15/20               DME Arranged: N/A DME Agency: NA       HH Arranged: NA           Social Determinants of Health (SDOH) Interventions    Readmission Risk Interventions Readmission Risk Prevention Plan 08/30/2020 08/24/2020  Transportation Screening Complete Complete  PCP or Specialist Appt within 5-7 Days - Complete  PCP or Specialist Appt within 3-5 Days (No Data) -  Home Care Screening - Complete  Medication Review (RN CM) - Complete  HRI or Home Care Consult (No Data) -  Palliative Care Screening Complete -  Medication Review (RN Care Manager) Complete -  Some recent data might be hidden

## 2020-11-16 NOTE — Progress Notes (Signed)
PT Cancellation Note  Patient Details Name: [name] MRN: [id number] DOB: Apr 01, 1921   Cancelled Treatment:    Reason Eval/Treat Not Completed: Other (comment) Spoke with nurse who was getting ready to take pt meds.  She reports that pt states she is "not having a good day" and suggests that it is not a good time to work with her.  Will maintain on caseload and continue to follow as appropriate.  Kreg Shropshire, DPT 11/16/2020, 5:39 PM

## 2020-11-16 NOTE — Plan of Care (Addendum)
PMT note:  Consult noted. Patient is on covid isolation. No family at bedside. Attempted to reach daughter unsuccessfully at phone number provided. Will re-attempt at another time. If she discharges prior to [place] conversation, please D/C with outpatient palliative.

## 2020-11-16 NOTE — Progress Notes (Signed)
PROGRESS NOTE   HPI was taken from Dr. Francine Graven:  [name] is a 87 y.o. female with medical history significant for hypertension, history of breast cancer, history of hiatal hernia and GERD who was brought into the ER for evaluation of weakness and left leg laceration. Patient's son states that over the last couple of days she has been increasingly weak and has been wheelchair-bound.  At baseline she is able to ambulate with use of an assist device.  He states that she usually has low potassium levels which make her very weak.  Patient was being assisted to her bed by one of the healthcare workers and hit her leg on the wheelchair resulting in a huge laceration with significant bleeding so she was brought to the ER for evaluation. Left leg laceration was sutured in the ED patient was found to have a potassium level of 2.7. She denies having any nausea, no vomiting, no diarrhea, no dizziness, no headache, no fever, no chills, no blurred vision, no focal deficits, no cough, no palpitations or diaphoresis. Labs show sodium 130, potassium 2.7, chloride 95, bicarb 26, glucose 267, BUN 11, creatinine 0.82, calcium 8.4, magnesium 1.7, white count 12.8, hemoglobin 12.1, hematocrit 37.4, MCV 83.5, RDW 16.1, platelet count 472 X-ray of the left tibia-fibula shows no acute osseous abnormality.  Negative for radiopaque foreign body. Twelve-lead EKG reviewed by me shows atrial fibrillation, right bundle branch block and T wave inversions in the lateral leads.         ED Course: Patient is an 87 year old female who was brought into the ER for evaluation of a left leg laceration and weakness.  Labs reveal potassium of 2.7. Patient will be referred to observation status for further evaluation.     Hospital course from Dr. Jimmye Norman 6/18-6/20/22: Pt was found to have symptomatic hypokalemia & hyponatremia for which the pt was treated w/ potassium supplements and IVFs. Hyponatremia & hypokalemia have both  resolved prior to d/c. Of note, pt was found to have new onset a. fib that has been rate controlled the entire time. Pt will continue on metoprolol and eliquis. Also, urine cx was taken and grew only a containment. Repeat urine cx was taken on day of d/c. Pt has been afebrile and normal WBC throughout hospital stay, unlikely pt has a UTI. Pt was given fluconazole x 1 for possible yeast infection. Furthermore, pt has had continual decline since March 2022 and pt's daughter is interested having palliative care evaluate the pt. Palliative care consulted    LADONNE SHARPLES  KCL:[id number] DOB: [birth year] DOA: 11/12/2020 PCP: [name], MD   Assessment & Plan:   Principal Problem:   Hypokalemia Active Problems:   Hypertension   Depression   History of pulmonary embolus (PE)   Weakness   Unspecified atrial fibrillation (HCC)   Pressure injury of skin  Generalized weakness & failure to thrive: PT/OT recs SNF. Pt has had continual decline since March 2022. Palliative care consulted.   Intermittent dysuria: no dysuria today. Etiology unclear, UTI vs yeast infection. S/p fluconazole x 1. Urine cx showed containment. Repeat urine cx pending. No fevers, normal WBC   Hypokalemia: WNL    Hyponatremia: WNL    A. fib: new onset. Rate controlled. Continue on home dose of eliquis & metoprolol   Depression: severity unknown. Continue on home dose of duloxetine    Hiatal hernia w/ GERD: continue on PPI    Thrombocytosis: likely reactive. Etiology unclear.   Normocytic anemia: H&H are  stable    DVT prophylaxis: eliquis  Code Status: DNR Family Communication: discussed pt's care w/ pt's daughter, [name], and answered her questions  Disposition Plan: depends on palliative care evaluation   Level of care: Med-Surg  Status is: Observation  The patient will require care spanning > 2 midnights and should be moved to inpatient because: palliative care consulted as pt has had continual decline since  March 2022   Dispo: The patient is from: SNF              Anticipated d/c is to: SNF              Patient currently is medically stable to d/c.   Difficult to place patient No        Consultants:    Procedures:   Antimicrobials   Subjective: Pt c/o feeling "lousy and pain all over."  Objective: Vitals:   11/15/20 2322 11/16/20 0317 11/16/20 0828 11/16/20 1205  BP: 126/72 (!) 149/82 (!) 165/95 (!) 155/79  Pulse: 69 69 75 77  Resp: 18 16 18    Temp: 98 F (36.7 C) 98.7 F (37.1 C) 98.2 F (36.8 C) 98 F (36.7 C)  TempSrc:   Oral Oral  SpO2: 96% 96% 98% 97%  Weight:      Height:        Intake/Output Summary (Last 24 hours) at 11/16/2020 1252 Last data filed at 11/16/2020 0829 Gross per 24 hour  Intake 100 ml  Output 850 ml  Net -750 ml   Filed Weights   11/12/20 1915 11/12/20 1929 11/13/20 0129  Weight: 62.6 kg 62.6 kg 62.9 kg    Examination:  General exam: Appears uncomfortable  Respiratory system: clear breath sounds b/l Cardiovascular system: S1 & S2 +. No rubs, gallops or clicks. No pedal edema. Gastrointestinal system: Abdomen is nondistended, soft and nontender.  Normal bowel sounds heard. Central nervous system: Alert and awake. Moves all extremities  Psychiatry: Judgement and insight appear abnormal. Flat mood and affect    Data Reviewed: I have personally reviewed following labs and imaging studies  CBC: Recent Labs  Lab 11/12/20 1936 11/13/20 0510 11/14/20 0557 11/15/20 0912 11/16/20 0502  WBC 12.8* 9.1 8.1 9.6 7.8  HGB 12.1 11.5* 11.2* 11.6* 11.1*  HCT 37.4 34.2* 33.8* 35.2* 33.7*  MCV 83.5 81.4 81.6 82.4 82.2  PLT 472* 454* 453* 498* 295*   Basic Metabolic Panel: Recent Labs  Lab 11/12/20 1936 11/13/20 0510 11/14/20 0557 11/15/20 0912 11/16/20 0502  NA 130* 131* 135 135 138  K 2.7* 3.4* 3.8 3.8 3.9  CL 95* 98 101 100 103  CO2 26 27 27 27 29   GLUCOSE 267* 123* 139* 200* 134*  BUN 11 11 8 8 10   CREATININE 0.82 0.42*  <0.30* 0.47 0.45  CALCIUM 8.4* 8.3* 8.4* 8.5* 8.6*  MG 1.7 2.1 1.8 1.8 2.2   GFR: Estimated Creatinine Clearance: 39.1 mL/min (by C-G formula based on SCr of 0.45 mg/dL). Liver Function Tests: No results for input(s): AST, ALT, ALKPHOS, BILITOT, PROT, ALBUMIN in the last 168 hours. No results for input(s): LIPASE, AMYLASE in the last 168 hours. No results for input(s): AMMONIA in the last 168 hours. Coagulation Profile: No results for input(s): INR, PROTIME in the last 168 hours. Cardiac Enzymes: No results for input(s): CKTOTAL, CKMB, CKMBINDEX, TROPONINI in the last 168 hours. BNP (last 3 results) No results for input(s): PROBNP in the last 8760 hours. HbA1C: No results for input(s): HGBA1C in the last 72 hours.  CBG: No results for input(s): GLUCAP in the last 168 hours. Lipid Profile: No results for input(s): CHOL, HDL, LDLCALC, TRIG, CHOLHDL, LDLDIRECT in the last 72 hours. Thyroid Function Tests: No results for input(s): TSH, T4TOTAL, FREET4, T3FREE, THYROIDAB in the last 72 hours. Anemia Panel: No results for input(s): VITAMINB12, FOLATE, FERRITIN, TIBC, IRON, RETICCTPCT in the last 72 hours. Sepsis Labs: No results for input(s): PROCALCITON, LATICACIDVEN in the last 168 hours.  Recent Results (from the past 240 hour(s))  Urine Culture     Status: Abnormal   Collection Time: 11/14/20  1:26 AM   Specimen: Urine, Random  Result Value Ref Range Status   Specimen Description   Final    URINE, RANDOM Performed at [facility], [address], [place], [place] [address]    Special Requests   Final    NONE Performed at [facility], [address] [place],  [address]    Culture (A)  Final    30,000 COLONIES/mL MULTIPLE SPECIES PRESENT, SUGGEST RECOLLECTION   Report Status 11/15/2020 FINAL  Final         Radiology Studies: No results found.      Scheduled Meds:  acidophilus  1 capsule Oral Q breakfast   apixaban  5 mg Oral BID    vitamin C  1,000 mg Oral Daily   cholecalciferol  2,000 Units Oral Daily   DULoxetine  20 mg Oral Daily   feeding supplement  237 mL Oral BID BM   melatonin  5 mg Oral QHS   metoprolol succinate  25 mg Oral Q breakfast   multivitamin with minerals  1 tablet Oral Daily   pantoprazole  40 mg Oral BID AC   vitamin B-12  1,000 mcg Oral Daily   Continuous Infusions:   LOS: 0 days    Time spent: 33 mins     Wyvonnia Dusky, MD [facility] Pager [phone]  If 7PM-7AM, please contact night-coverage 11/16/2020, 12:52 PM

## 2020-11-16 NOTE — Care Management Obs Status (Signed)
[place] NOTIFICATION   Patient Details  Name: [name] MRN: [id number] Date of Birth: 1920-11-25   Medicare Observation Status Notification Given:  Yes    Shelbie Hutching, RN 11/16/2020, 10:57 AM

## 2020-11-17 DIAGNOSIS — Z7189 Other specified counseling: Secondary | ICD-10-CM

## 2020-11-17 DIAGNOSIS — N3000 Acute cystitis without hematuria: Principal | ICD-10-CM

## 2020-11-17 DIAGNOSIS — F32A Depression, unspecified: Secondary | ICD-10-CM

## 2020-11-17 DIAGNOSIS — I480 Paroxysmal atrial fibrillation: Secondary | ICD-10-CM

## 2020-11-17 DIAGNOSIS — Z515 Encounter for palliative care: Secondary | ICD-10-CM

## 2020-11-17 DIAGNOSIS — R410 Disorientation, unspecified: Secondary | ICD-10-CM | POA: Diagnosis not present

## 2020-11-17 DIAGNOSIS — L89301 Pressure ulcer of unspecified buttock, stage 1: Secondary | ICD-10-CM

## 2020-11-17 DIAGNOSIS — E876 Hypokalemia: Secondary | ICD-10-CM | POA: Diagnosis not present

## 2020-11-17 LAB — CBC
HCT: 33.9 % — ABNORMAL LOW (ref 36.0–46.0)
Hemoglobin: 11.1 g/dL — ABNORMAL LOW (ref 12.0–15.0)
MCH: 27.2 pg (ref 26.0–34.0)
MCHC: 32.7 g/dL (ref 30.0–36.0)
MCV: 83.1 fL (ref 80.0–100.0)
Platelets: 537 10*3/uL — ABNORMAL HIGH (ref 150–400)
RBC: 4.08 MIL/uL (ref 3.87–5.11)
RDW: 16.6 % — ABNORMAL HIGH (ref 11.5–15.5)
WBC: 9.7 10*3/uL (ref 4.0–10.5)
nRBC: 0 % (ref 0.0–0.2)

## 2020-11-17 LAB — BASIC METABOLIC PANEL
Anion gap: 5 (ref 5–15)
BUN: 10 mg/dL (ref 8–23)
CO2: 30 mmol/L (ref 22–32)
Calcium: 8.6 mg/dL — ABNORMAL LOW (ref 8.9–10.3)
Chloride: 103 mmol/L (ref 98–111)
Creatinine, Ser: 0.43 mg/dL — ABNORMAL LOW (ref 0.44–1.00)
GFR, Estimated: >60 mL/min (ref >60)
Glucose, Bld: 116 mg/dL — ABNORMAL HIGH (ref 70–99)
Potassium: 4 mmol/L (ref 3.5–5.1)
Sodium: 138 mmol/L (ref 135–145)

## 2020-11-17 LAB — MAGNESIUM: Magnesium: 1.9 mg/dL (ref 1.7–2.4)

## 2020-11-17 MED ORDER — SODIUM CHLORIDE 0.9 % IV SOLN
1.0000 g | INTRAVENOUS | Status: DC
  Administered 2020-11-17 – 2020-11-19 (×3): 1 g via INTRAVENOUS
  Filled 2020-11-17: qty 1
  Filled 2020-11-17 (×4): qty 10

## 2020-11-17 NOTE — Progress Notes (Signed)
Patient ID: [name], female   DOB: 08/29/1920, 87 y.o.   MRN: [id number] [facility] PROGRESS NOTE  [name] PRF:[id number] DOB: [birth year] DOA: 11/12/2020 PCP: [name], MD  HPI/Subjective: Seen this patient this morning secondary to confusion.  Patient was feeding herself some breakfast.  She thought it was nighttime.  Does answer some questions but goes off on tangents.  Spoke to the patient's daughter in the hallway and she states that the patient has been right since Saturday.  I started antibiotics this morning for urinary tract infection.  Initially brought in with hypokalemia.  Objective: Vitals:   11/17/20 0415 11/17/20 1219  BP: 134/84 118/71  Pulse: 66 66  Resp: 18 16  Temp: 97.8 F (36.6 C) 98.3 F (36.8 C)  SpO2: 98% 96%    Intake/Output Summary (Last 24 hours) at 11/17/2020 1525 Last data filed at 11/17/2020 1504 Gross per 24 hour  Intake 119.45 ml  Output 500 ml  Net -380.55 ml   Filed Weights   11/12/20 1915 11/12/20 1929 11/13/20 0129  Weight: 62.6 kg 62.6 kg 62.9 kg    ROS: Review of Systems  Unable to perform ROS: Acuity of condition  Cardiovascular:  Negative for chest pain.  Gastrointestinal:  Negative for abdominal pain.  Exam: Physical Exam HENT:     Head: Normocephalic.     Mouth/Throat:     Pharynx: No oropharyngeal exudate.  Eyes:     General: Lids are normal.     Conjunctiva/sclera: Conjunctivae normal.  Cardiovascular:     Rate and Rhythm: Normal rate and regular rhythm.     Heart sounds: Normal heart sounds, S1 normal and S2 normal.  Pulmonary:     Breath sounds: Examination of the right-lower field reveals decreased breath sounds. Examination of the left-lower field reveals decreased breath sounds. Decreased breath sounds present. No wheezing, rhonchi or rales.  Abdominal:     Palpations: Abdomen is soft.     Tenderness: There is no abdominal tenderness.  Musculoskeletal:     Right lower leg: No swelling.      Left lower leg: No swelling.  Skin:    General: Skin is warm.     Findings: No rash.  Neurological:     Mental Status: She is disoriented.  Psychiatric:        Attention and Perception: She is inattentive.     Data Reviewed: Basic Metabolic Panel: Recent Labs  Lab 11/13/20 0510 11/14/20 0557 11/15/20 0912 11/16/20 0502 11/17/20 0512  NA 131* 135 135 138 138  K 3.4* 3.8 3.8 3.9 4.0  CL 98 101 100 103 103  CO2 27 27 27 29 30   GLUCOSE 123* 139* 200* 134* 116*  BUN 11 8 8 10 10   CREATININE 0.42* <0.30* 0.47 0.45 0.43*  CALCIUM 8.3* 8.4* 8.5* 8.6* 8.6*  MG 2.1 1.8 1.8 2.2 1.9    CBC: Recent Labs  Lab 11/13/20 0510 11/14/20 0557 11/15/20 0912 11/16/20 0502 11/17/20 0512  WBC 9.1 8.1 9.6 7.8 9.7  HGB 11.5* 11.2* 11.6* 11.1* 11.1*  HCT 34.2* 33.8* 35.2* 33.7* 33.9*  MCV 81.4 81.6 82.4 82.2 83.1  PLT 454* 453* 498* 496* 537*    BNP (last 3 results) Recent Labs    08/21/20 1628 08/25/20 0811 08/31/20 1430  BNP 320.0* 131.6* 82.1     Recent Results (from the past 240 hour(s))  Urine Culture     Status: Abnormal   Collection Time: 11/14/20  1:26 AM   Specimen: Urine,  Random  Result Value Ref Range Status   Specimen Description   Final    URINE, RANDOM Performed at [facility], [address] [place], [place] [address]    Special Requests   Final    NONE Performed at [facility], [address] [place], [place] [address]    Culture (A)  Final    30,000 COLONIES/mL MULTIPLE SPECIES PRESENT, SUGGEST RECOLLECTION   Report Status 11/15/2020 FINAL  Final  Urine Culture     Status: Abnormal (Preliminary result)   Collection Time: 11/15/20  6:20 PM   Specimen: Urine, Random  Result Value Ref Range Status   Specimen Description   Final    URINE, RANDOM Performed at [facility], [address], [place], [place] [address]    Special Requests   Final    NONE Performed at [facility], [address],  [place], [place] [address]    Culture (A)  Final    >=100,000 COLONIES/mL CITROBACTER KOSERI 20,000 COLONIES/mL PROTEUS MIRABILIS CULTURE REINCUBATED FOR BETTER GROWTH Performed at [facility], [place] [address] [place], [place] [address]    Report Status PENDING  Incomplete      Scheduled Meds:  acidophilus  1 capsule Oral Q breakfast   apixaban  5 mg Oral BID   vitamin C  1,000 mg Oral Daily   cholecalciferol  2,000 Units Oral Daily   DULoxetine  20 mg Oral Daily   feeding supplement  237 mL Oral BID BM   melatonin  5 mg Oral QHS   metoprolol succinate  25 mg Oral Q breakfast   multivitamin with minerals  1 tablet Oral Daily   pantoprazole  40 mg Oral BID AC   vitamin B-12  1,000 mcg Oral Daily   Continuous Infusions:  cefTRIAXone (ROCEPHIN)  IV Stopped (11/17/20 1420)    Assessment/Plan:  Acute delirium.  Patient very inattentive and went off on tangents very easily.  As needed Haldol.  Start antibiotics for urinary tract infection. Acute cystitis without hematuria.  Citrobacter growing out of culture.  Started Rocephin this morning. Hypokalemia.  This has been replaced Atrial fibrillation new onset.  Rate controlled.  Continue metoprolol and Eliquis for anticoagulation Depression on Cymbalta Hiatal hernia on PPI Previous GI bleed.  Hemoglobin stable on Eliquis Stage I decubiti on buttocks.  See description below  Pressure Injury 11/13/20 Buttocks Right;Left Stage 1 -  Intact skin with non-blanchable redness of a localized area usually over a bony prominence. skin reddened, no open areas (Active)  11/13/20 0216  Location: Buttocks  Location Orientation: Right;Left  Staging: Stage 1 -  Intact skin with non-blanchable redness of a localized area usually over a bony prominence.  Wound Description (Comments): skin reddened, no open areas  Present on Admission: Yes       Code Status:     Code Status Orders  (From admission, onward)           Start     Ordered    11/13/20 0121  Do not attempt resuscitation (DNR)  Continuous       Question Answer Comment  In the event of cardiac or respiratory ARREST Do not call a "code blue"   In the event of cardiac or respiratory ARREST Do not perform Intubation, CPR, defibrillation or ACLS   In the event of cardiac or respiratory ARREST Use medication by any route, position, wound care, and other measures to relive pain and suffering. May use oxygen,  suction and manual treatment of airway obstruction as needed for comfort.      11/13/20 0121           Code Status History     Date Active Date Inactive Code Status Order ID Comments User Context   11/12/2020 2345 11/13/2020 0121 DNR [id number]  Merlyn Lot, MD ED   10/31/2020 1943 11/02/2020 2217 DNR [id number]  Rise Patience, MD ED   08/21/2020 1343 08/30/2020 2120 DNR [id number]  Ivor Costa, MD ED   08/21/2020 1342 08/21/2020 1343 DNR [id number]  Ivor Costa, MD ED   11/26/2016 1201 11/28/2016 1637 DNR [id number]  Demetrios Loll, MD Inpatient   08/21/2016 1824 08/25/2016 1857 Full Code [id number]  Epifanio Lesches, MD ED      Advance Directive Documentation    Flowsheet Row Most Recent Value  Type of Advance Directive Out of facility DNR (pink MOST or yellow form)  Pre-existing out of facility DNR order (yellow form or pink MOST form) --  "MOST" Form in Place? --      Family Communication: Spoke with daughter in the hallway Disposition Plan: Status is: Observation  Dispo: The patient is from: Home              Anticipated d/c is to: Home              Patient currently with acute delirium today starting antibiotics for acute cystitis   Difficult to place patient.  No.  Antibiotics: Rocephin  Time spent: 27 minutes  [place]

## 2020-11-17 NOTE — Progress Notes (Signed)
Physical Therapy Treatment Patient Details Name: [name] MRN: [id number] DOB: 17-Nov-1920 Today's Date: 11/17/2020    History of Present Illness [name] is a 87 y.o. female with medical history significant for hypertension, history of breast cancer, history of hiatal hernia and GERD who was brought into the ER for evaluation of weakness and left leg laceration.  Patient's son states that over the last couple of days she has been increasingly weak and has been wheelchair-bound.  At baseline she is able to ambulate with use of an assist device.  He states that she usually has low potassium levels which make her very weak.  Patient was being assisted to her bed by one of the healthcare workers and hit her leg on the wheelchair resulting in a huge laceration with significant bleeding so she was brought to the ER for evaluation.  Left leg laceration was sutured in the ED patient was found to have a potassium level of 2.7. Of note: pt with recent admission d/t DVT/PE.    PT Comments    Pt pleasantly confused t/o session, but eager to work with PT and showed great effort.  Family present and reports that she had been walking >100 ft at rehab a few months ago but UTIs, Covid, etc have set her back significantly.  She did show surprisingly quality movement and strength with moderate supine exercises though she had arthritis t/o and most notably in the  R knee.  Pt and family defer attempts at sitting/standing this date, but as her mentation improves she it appears she has the strength to work toward more mobilit/activity.  Follow Up Recommendations  SNF     Equipment Recommendations  None recommended by PT    Recommendations for Other Services       Precautions / Restrictions Precautions Precautions: Fall Restrictions Weight Bearing Restrictions: No    Mobility  Bed Mobility               General bed mobility comments: Pt states she does not wish to attempt getting up, family  present and agrees not to push mobility today    Transfers                    Ambulation/Gait                 Stairs             Wheelchair Mobility    Modified Rankin (Stroke Patients Only)       Balance                                            Cognition Arousal/Alertness: Awake/alert Behavior During Therapy: WFL for tasks assessed/performed Overall Cognitive Status: Impaired/Different from baseline                                 General Comments: continues to follow simple commands, UTI induced confusion, she thought she was at rehab but very pleasant and eager to work with PT      Exercises General Exercises - Lower Extremity Ankle Circles/Pumps: AAROM;Strengthening;10 reps (lacks L ankle DF past neutral) Short Arc Quad: AROM;10 reps Heel Slides: AAROM;10 reps (with resisted leg ext, R knee flexion pain/OA limited to ~70) Hip ABduction/ADduction: AROM;10 reps;Strengthening Straight Leg Raises: AAROM;10 reps  General Comments        Pertinent Vitals/Pain Pain Assessment: Faces Faces Pain Scale: Hurts little more Pain Location: R knee with flexion <~70    Home Living                      Prior Function            PT Goals (current goals can now be found in the care plan section) Progress towards PT goals: Progressing toward goals    Frequency    Min 2X/week      PT Plan Current plan remains appropriate    Co-evaluation              AM-PAC PT "6 Clicks" Mobility   Outcome Measure  Help needed turning from your back to your side while in a flat bed without using bedrails?: A Lot Help needed moving from lying on your back to sitting on the side of a flat bed without using bedrails?: A Lot Help needed moving to and from a bed to a chair (including a wheelchair)?: Total Help needed standing up from a chair using your arms (e.g., wheelchair or bedside chair)?: Total Help  needed to walk in hospital room?: Total Help needed climbing 3-5 steps with a railing? : Total 6 Click Score: 8    End of Session   Activity Tolerance: Patient tolerated treatment well (R knee pain somewhat limiting) Patient left: with bed alarm set;with family/visitor present;with call bell/phone within reach   PT Visit Diagnosis: Difficulty in walking, not elsewhere classified (R26.2);Muscle weakness (generalized) (M62.81)     Time: 5521-7471 PT Time Calculation (min) (ACUTE ONLY): 28 min  Charges:  $Therapeutic Exercise: 23-37 mins                     Kreg Shropshire, DPT 11/17/2020, 6:15 PM

## 2020-11-17 NOTE — Consult Note (Addendum)
Consultation Note Date: 11/17/2020   Patient Name: [name]  DOB: November 25, 1920  MRN: [id number]  Age / Sex: 87 y.o., female  PCP: [name], MD Referring Physician: Loletha Grayer, MD  Reason for Consultation: Establishing goals of care  HPI/Patient Profile:  [name] is a 87 y.o. female with medical history significant for hypertension, history of breast cancer, history of hiatal hernia and GERD who was brought into the ER for evaluation of weakness and left leg laceration. Patient's son states that over the last couple of days she has been increasingly weak and has been wheelchair-bound.  At baseline she is able to ambulate with use of an assist device.  He states that she usually has low potassium levels which make her very weak.  Patient was being assisted to her bed by one of the healthcare workers and hit her leg on the wheelchair resulting in a huge laceration with significant bleeding so she was brought to the ER for evaluation.  Clinical Assessment and Goals of Care:  EMR notes and labs reviewed in detail. Her frailty, electrolyte imbalance, and overall decline is noted. Spoke with staff regarding her status. Patient is resting in bed with eyes closed and even and unlabored respirations. No family at bedside. Called her daughter. Daughter states she is busy and unable to talk at this time and states she will be available soon and to call back. She states she will not be available tomorrow until after lunch. Will try back at a later time.   ADDENDUM: 3:00- Attempted to call daughter back with no answer.   Please discharge with palliative to follow outpatient for [facility] outpatient.     SUMMARY OF RECOMMENDATIONS   Recommend D/C with palliative if unable to complete [facility] conversation prior to D/C.    Prognosis:  Poor overall       Primary Diagnoses: Present on Admission:   Hypokalemia  Depression  History of pulmonary embolus (PE)  Hypertension  Unspecified atrial fibrillation ([place])   I have reviewed the medical record, interviewed the patient and family, and examined the patient. The following aspects are pertinent.  Past Medical History:  Diagnosis Date   Allergy    Arthritis    back   Benign neoplasm of skin, site unspecified    Bowel trouble    Breast cancer ([place])    Cancer ([place])    colon 20 years ago   Cancer of breast ([place]) April 10,.2013   Left breast, 3.8 cm, 4 cm axillary node, T2, N1a, ER negative PR negative, HER-2/neu not amplified.   Cataract    Colon cancer ([place])    Hard of hearing    Heartburn    Hiatal hernia    History of stomach ulcers    History of stomach ulcers    Hypertension 2012   Personal history of malignant neoplasm of breast 2013   LEFT MASTECTOMY,left modified radical mastectomy on September 06, 2011 483.8 cm primary tumor with a 4.6 cm axillary metastasis. 3 of 13 nodes  were positive. T2,N1a lesion. This was a triple negative lesion   Shingles Dec 2015   Vaginal atrophy 08/10/2015   Wrist fracture, right    Social History   Socioeconomic History   Marital status: Widowed    Spouse name: Not on file   Number of children: Not on file   Years of education: Not on file   Highest education level: Not on file  Occupational History   Not on file  Tobacco Use   Smoking status: Never   Smokeless tobacco: Never  Vaping Use   Vaping Use: Never used  Substance and Sexual Activity   Alcohol use: No   Drug use: No   Sexual activity: Never    Birth control/protection: None  Other Topics Concern   Not on file  Social History Narrative   Not on file   Social Determinants of Health   Financial Resource Strain: Not on file  Food Insecurity: Not on file  Transportation Needs: Not on file  Physical Activity: Not on file  Stress: Not on file  Social Connections: Not on file   Family History  Problem Relation Age  of Onset   Lung cancer Brother        colono ca   Cancer Brother    Skin cancer Daughter    Leukemia Father    Cancer Father    Colon cancer Mother    Cancer Mother    Skin cancer Son    Kidney disease Neg Hx    Bladder Cancer Neg Hx    Scheduled Meds:  acidophilus  1 capsule Oral Q breakfast   apixaban  5 mg Oral BID   vitamin C  1,000 mg Oral Daily   cholecalciferol  2,000 Units Oral Daily   DULoxetine  20 mg Oral Daily   feeding supplement  237 mL Oral BID BM   melatonin  5 mg Oral QHS   metoprolol succinate  25 mg Oral Q breakfast   multivitamin with minerals  1 tablet Oral Daily   pantoprazole  40 mg Oral BID AC   vitamin B-12  1,000 mcg Oral Daily   Continuous Infusions:  cefTRIAXone (ROCEPHIN)  IV 1 g (11/17/20 0947)   PRN Meds:.acetaminophen **OR** acetaminophen, albuterol, ondansetron **OR** ondansetron (ZOFRAN) IV Medications Prior to Admission:  Prior to Admission medications   Medication Sig Start Date End Date Taking? Authorizing Provider  apixaban (ELIQUIS) 5 MG TABS tablet 10 mg BID for 3 more days then switch to 5 mg BID Patient taking differently: Take 5 mg by mouth 2 (two) times daily. 10 mg BID for 3 more days then switch to 5 mg BID 08/30/20  Yes Gherghe, Vella Redhead, MD  Ascorbic Acid (VITAMIN C) 1000 MG tablet Take 1,000 mg by mouth daily.   Yes [provider]  Cholecalciferol (VITAMIN D) 50 MCG (2000 UT) CAPS Take 1 capsule by mouth daily.   Yes [provider]  Cranberry 500 MG CAPS Take by mouth daily.   Yes [provider]  DULoxetine (CYMBALTA) 20 MG capsule Take 20 mg by mouth daily.   Yes [provider]  Lactobacillus Rhamnosus, GG, (PROBIOTIC COLIC PO) Take 1 Dose by mouth daily. doterra brands   Yes [provider]  Melatonin 5 MG CAPS Take 1 capsule by mouth.    Yes [provider]  metoprolol succinate (TOPROL-XL) 25 MG 24 hr tablet Take 25 mg by mouth daily.   Yes [provider]   pantoprazole (PROTONIX)  40 MG tablet Take 1 tablet (40 mg total) by mouth 2 (two) times daily before a meal. 11/02/20  Yes Max Sane, MD  Potassium 99 MG TABS Take 1 tablet by mouth daily.   Yes [provider]  potassium chloride (KLOR-CON) 10 MEQ tablet Take 2 tablets (20 mEq total) by mouth daily for 4 days. 11/12/20 11/16/20 Yes Merlyn Lot, MD  vitamin B-12 (CYANOCOBALAMIN) 1000 MCG tablet Take 1,000 mcg by mouth daily.   Yes [provider]  albuterol (VENTOLIN HFA) 108 (90 Base) MCG/ACT inhaler Inhale 1-2 puffs into the lungs every 6 (six) hours as needed for wheezing or shortness of breath. 10/29/20   [provider]  furosemide (LASIX) 20 MG tablet Take 1 tablet (20 mg total) by mouth daily for 3 days. 08/31/20 09/03/20  Caren Griffins, MD  loperamide (IMODIUM A-D) 2 MG tablet Take 2 mg by mouth as needed for diarrhea or loose stools.    [provider]   Allergies  Allergen Reactions   Codeine Nausea And Vomiting   Review of Systems  Unable to perform ROS  Physical Exam Constitutional:      Comments: Eyes closed. Even and unlabored respirations.     Vital Signs: BP 118/71 (BP Location: Right Arm)   Pulse 66   Temp 98.3 F (36.8 C)   Resp 16   Ht 5' 7" (1.702 m)   Wt 62.9 kg   SpO2 96%   BMI 21.72 kg/m  Pain Scale: 0-10   Pain Score: Asleep   SpO2: SpO2: 96 % O2 Device:SpO2: 96 % O2 Flow Rate: .   IO: Intake/output summary:  Intake/Output Summary (Last 24 hours) at 11/17/2020 1411 Last data filed at 11/17/2020 0415 Gross per 24 hour  Intake --  Output 500 ml  Net -500 ml    LBM: Last BM Date: 11/15/20 Baseline Weight: Weight: 62.6 kg Most recent weight: Weight: 62.9 kg        Time In: 1:45 Time Out: 2:15 Time Total: 30 min Greater than 50%  of this time was spent counseling and coordinating care related to the above assessment and plan.  Signed by: Asencion Gowda, NP   Please contact Palliative Medicine Team  phone at [phone] for questions and concerns.  For individual provider: See Shea Evans

## 2020-11-17 NOTE — TOC Progression Note (Signed)
Transition of Care [facility]) - Progression Note    Patient Details  Name: [name] MRN: [id number] Date of Birth: 1920/12/16  Transition of Care [facility]) CM/SW Contact  Shelbie Hutching, RN Phone Number: 11/17/2020, 3:53 PM  Clinical Narrative:    Per MD patient is not medically ready to discharge today.   Palliative NP has attempted to talk to the patient's daughter but daughter has been unavailable.  If palliative cannot meet with daughter before discharge we will have OP palliative follow patient at [facility].    Expected Discharge Plan: Skilled Nursing Facility Barriers to Discharge: Continued Medical Work up  Expected Discharge Plan and Services Expected Discharge Plan: [place]   Discharge Planning Services: CM Consult Post Acute Care Choice: [place] Living arrangements for the past 2 months: [place] Expected Discharge Date: 11/15/20               DME Arranged: N/A DME Agency: NA       HH Arranged: NA           Social Determinants of Health (SDOH) Interventions    Readmission Risk Interventions Readmission Risk Prevention Plan 08/30/2020 08/24/2020  Transportation Screening Complete Complete  PCP or Specialist Appt within 5-7 Days - Complete  PCP or Specialist Appt within 3-5 Days (No Data) -  Home Care Screening - Complete  Medication Review (RN CM) - Complete  HRI or Home Care Consult (No Data) -  Palliative Care Screening Complete -  Medication Review (RN Care Manager) Complete -  Some recent data might be hidden

## 2020-11-18 ENCOUNTER — Encounter: Payer: Medicare Other | Admitting: Obstetrics and Gynecology

## 2020-11-18 DIAGNOSIS — W19XXXA Unspecified fall, initial encounter: Secondary | ICD-10-CM | POA: Diagnosis not present

## 2020-11-18 DIAGNOSIS — S81812A Laceration without foreign body, left lower leg, initial encounter: Secondary | ICD-10-CM | POA: Diagnosis present

## 2020-11-18 DIAGNOSIS — N3000 Acute cystitis without hematuria: Secondary | ICD-10-CM | POA: Diagnosis present

## 2020-11-18 DIAGNOSIS — Z23 Encounter for immunization: Secondary | ICD-10-CM | POA: Diagnosis present

## 2020-11-18 DIAGNOSIS — D649 Anemia, unspecified: Secondary | ICD-10-CM | POA: Diagnosis present

## 2020-11-18 DIAGNOSIS — Z7189 Other specified counseling: Secondary | ICD-10-CM | POA: Diagnosis not present

## 2020-11-18 DIAGNOSIS — R54 Age-related physical debility: Secondary | ICD-10-CM | POA: Diagnosis present

## 2020-11-18 DIAGNOSIS — F32A Depression, unspecified: Secondary | ICD-10-CM | POA: Diagnosis present

## 2020-11-18 DIAGNOSIS — I10 Essential (primary) hypertension: Secondary | ICD-10-CM

## 2020-11-18 DIAGNOSIS — E876 Hypokalemia: Secondary | ICD-10-CM | POA: Diagnosis present

## 2020-11-18 DIAGNOSIS — Z66 Do not resuscitate: Secondary | ICD-10-CM | POA: Diagnosis present

## 2020-11-18 DIAGNOSIS — Y929 Unspecified place or not applicable: Secondary | ICD-10-CM | POA: Diagnosis not present

## 2020-11-18 DIAGNOSIS — E440 Moderate protein-calorie malnutrition: Secondary | ICD-10-CM | POA: Diagnosis present

## 2020-11-18 DIAGNOSIS — G9341 Metabolic encephalopathy: Secondary | ICD-10-CM | POA: Diagnosis present

## 2020-11-18 DIAGNOSIS — R410 Disorientation, unspecified: Secondary | ICD-10-CM | POA: Diagnosis not present

## 2020-11-18 DIAGNOSIS — Z79899 Other long term (current) drug therapy: Secondary | ICD-10-CM | POA: Diagnosis not present

## 2020-11-18 DIAGNOSIS — R627 Adult failure to thrive: Secondary | ICD-10-CM | POA: Diagnosis present

## 2020-11-18 DIAGNOSIS — E871 Hypo-osmolality and hyponatremia: Secondary | ICD-10-CM | POA: Diagnosis present

## 2020-11-18 DIAGNOSIS — Z8616 Personal history of COVID-19: Secondary | ICD-10-CM | POA: Diagnosis not present

## 2020-11-18 DIAGNOSIS — D75839 Thrombocytosis, unspecified: Secondary | ICD-10-CM | POA: Diagnosis present

## 2020-11-18 DIAGNOSIS — I480 Paroxysmal atrial fibrillation: Secondary | ICD-10-CM | POA: Diagnosis present

## 2020-11-18 DIAGNOSIS — L89301 Pressure ulcer of unspecified buttock, stage 1: Secondary | ICD-10-CM | POA: Diagnosis present

## 2020-11-18 LAB — CBC
HCT: 33.4 % — ABNORMAL LOW (ref 36.0–46.0)
Hemoglobin: 10.6 g/dL — ABNORMAL LOW (ref 12.0–15.0)
MCH: 26.4 pg (ref 26.0–34.0)
MCHC: 31.7 g/dL (ref 30.0–36.0)
MCV: 83.1 fL (ref 80.0–100.0)
Platelets: 562 10*3/uL — ABNORMAL HIGH (ref 150–400)
RBC: 4.02 MIL/uL (ref 3.87–5.11)
RDW: 16.8 % — ABNORMAL HIGH (ref 11.5–15.5)
WBC: 9 10*3/uL (ref 4.0–10.5)
nRBC: 0 % (ref 0.0–0.2)

## 2020-11-18 LAB — MAGNESIUM: Magnesium: 2.1 mg/dL (ref 1.7–2.4)

## 2020-11-18 LAB — URINE CULTURE: Culture: >=100000 — AB

## 2020-11-18 LAB — BASIC METABOLIC PANEL
Anion gap: 6 (ref 5–15)
BUN: 12 mg/dL (ref 8–23)
CO2: 28 mmol/L (ref 22–32)
Calcium: 8.6 mg/dL — ABNORMAL LOW (ref 8.9–10.3)
Chloride: 104 mmol/L (ref 98–111)
Creatinine, Ser: 0.5 mg/dL (ref 0.44–1.00)
GFR, Estimated: >60 mL/min (ref >60)
Glucose, Bld: 109 mg/dL — ABNORMAL HIGH (ref 70–99)
Potassium: 4.3 mmol/L (ref 3.5–5.1)
Sodium: 138 mmol/L (ref 135–145)

## 2020-11-18 MED ORDER — FLUCONAZOLE 50 MG PO TABS
150.0000 mg | ORAL_TABLET | Freq: Once | ORAL | Status: AC
  Administered 2020-11-18: 14:00:00 150 mg via ORAL
  Filled 2020-11-18: qty 1

## 2020-11-18 NOTE — Progress Notes (Signed)
   11/18/20 1207  Vitals  BP (!) 167/99  Paged MD Leslye Peer

## 2020-11-18 NOTE — Progress Notes (Addendum)
Pt has white discharge coming from vagina and yeast in creases paged Md wieting see new orders

## 2020-11-18 NOTE — Progress Notes (Signed)
Occupational Therapy Treatment Patient Details Name: [name] MRN: [id number] DOB: Jul 06, 1920 Today's Date: 11/18/2020    History of present illness [name] is a 87 y.o. female with medical history significant for hypertension, history of breast cancer, history of hiatal hernia and GERD who was brought into the ER for evaluation of weakness and left leg laceration.  Patient's son states that over the last couple of days she has been increasingly weak and has been wheelchair-bound.  At baseline she is able to ambulate with use of an assist device.  He states that she usually has low potassium levels which make her very weak.  Patient was being assisted to her bed by one of the healthcare workers and hit her leg on the wheelchair resulting in a huge laceration with significant bleeding so she was brought to the ER for evaluation.  Left leg laceration was sutured in the ED patient was found to have a potassium level of 2.7. Of note: pt with recent admission d/t DVT/PE.   OT comments  Pt greeted in bed, agreeable to OT tx session. Pt is oriented to self. Pt performs supine<>seated at EOB with MOD A x2. UB grooming performed with MOD A, UB dressing with MAX A at EOB. Vcs required throughout for sequencing of ADL tasks. Verbal and tactile cueing also required due to pt posterior lean during task completion. Tolerates seated at EOB for ~8 minutes. Pt performs STS with MOD Ax2 with RW, tolerates standing for no more than 30 seconds. Pt performs rolling in bed with MOD A, frequent vcs, use of bed rails. MAX A required for peri care and repositioning in bed. SET UP required for self feeding (banana) in high fowlers position. Pt is left as received, NAD, all needs met. Continue OT per POC.   Follow Up Recommendations  SNF    Equipment Recommendations  As per next level of care   Recommendations for Other Services      Precautions / Restrictions Precautions Precautions: Fall       Mobility  Bed Mobility Overal bed mobility: Needs Assistance Bed Mobility: Rolling;Supine to Sit;Sit to Supine Rolling: Mod assist   Supine to sit: Mod assist;+2 for physical assistance Sit to supine: Mod assist;+2 for physical assistance                            Balance Overall balance assessment: Needs assistance Sitting-balance support: Bilateral upper extremity supported;Feet supported Sitting balance-Leahy Scale: Fair Sitting balance - Comments: Fair sitting balance, required frequent vcs, intermittent tactile cues for upright sitting Postural control: Posterior lean Standing balance support: Bilateral upper extremity supported Standing balance-Leahy Scale: Poor                             ADL either performed or assessed with clinical judgement   ADL Overall ADL's : Needs assistance/impaired Eating/Feeding: Set up;Bed level Eating/Feeding Details (indicate cue type and reason): Pt in high fowlers Grooming: Wash/dry face;Moderate assistance;Cueing for sequencing   Upper Body Bathing: Moderate assistance;Cueing for sequencing;Sitting Upper Body Bathing Details (indicate cue type and reason): at EOB     Upper Body Dressing : Minimal assistance;Sitting;Cueing for sequencing Upper Body Dressing Details (indicate cue type and reason): EOB         Toileting- Clothing Manipulation and Hygiene: Maximal assistance;Bed level Toileting - Clothing Manipulation Details (indicate cue type and reason): peri care  Functional mobility during ADLs: Moderate assistance;+2 for physical assistance General ADL Comments: MIN A for seated UB dressing, MOD A for UB grooming/hygiene at EOB, MOD A for bed mobility, sup to sit, MIN A for rolling using bed rails, MOD A x2 for STS with RW 1x     Vision Patient Visual Report: No change from baseline     Perception     Praxis      Cognition Arousal/Alertness: Awake/alert Behavior During Therapy: WFL for tasks  assessed/performed Overall Cognitive Status: History of cognitive impairments - at baseline Area of Impairment: Orientation;Following commands;Problem solving                 Orientation Level: Time;Situation;Disoriented to     Following Commands: Follows one step commands with increased time     Problem Solving: Slow processing;Difficulty sequencing;Requires verbal cues;Decreased initiation;Requires tactile cues          Exercises     Shoulder Instructions       General Comments      Pertinent Vitals/ Pain       Pain Assessment: Faces Faces Pain Scale: Hurts little more Pain Location: pt unable to provide pain location, reports "I just don't feel good" Pain Intervention(s): Monitored during session;Repositioned  Home Living                                          Prior Functioning/Environment              Frequency  Min 1X/week        Progress Toward Goals  OT Goals(current goals can now be found in the care plan section)  Progress towards OT goals: Progressing toward goals  Acute Rehab OT Goals Patient Stated Goal: none stated OT Goal Formulation: Patient unable to participate in goal setting Time For Goal Achievement: 11/27/20 Potential to Achieve Goals: [place] Discharge plan remains appropriate;Frequency remains appropriate    Co-evaluation                 AM-PAC OT "6 Clicks" Daily Activity     Outcome Measure   Help from another person eating meals?: A Little Help from another person taking care of personal grooming?: A Lot Help from another person toileting, which includes using toliet, bedpan, or urinal?: A Lot Help from another person bathing (including washing, rinsing, drying)?: A Lot Help from another person to put on and taking off regular upper body clothing?: A Little Help from another person to put on and taking off regular lower body clothing?: A Lot 6 Click Score: 14    End of Session  Equipment Utilized During Treatment: Gait belt;Rolling walker  OT Visit Diagnosis: Unsteadiness on feet (R26.81);Muscle weakness (generalized) (M62.81)   Activity Tolerance Patient limited by fatigue   Patient Left in bed;with call bell/phone within reach;with bed alarm set   Nurse Communication          Time: 3664-4034 OT Time Calculation (min): 36 min  Charges: OT General Charges $OT Visit: 1 Visit OT Treatments $Self Care/Home Management : 8-22 mins $Therapeutic Activity: 8-22 mins  Shanon Payor, OTD OTR/L   11/18/20

## 2020-11-18 NOTE — Progress Notes (Signed)
Pt had generalized weakness and unable to stand for ortho VS at this time.

## 2020-11-18 NOTE — Progress Notes (Addendum)
Patient ID: [name], female   DOB: 05-Dec-1920, 87 y.o.   MRN: [id number] [facility] PROGRESS NOTE  ADIN LARICCIA FIE:[id number] DOB: August 22, 1920 DOA: 11/12/2020 PCP: [name], MD  HPI/Subjective: Patient seen this morning.  Able to communicate better today than yesterday.  Able to follow commands and straight leg raise.  Doing better today than yesterday.  Trying to reach patient's daughter about how she thinks the patient is doing.  Was not able to do too much today with physical therapy and they were only able to get her to stand and she wanted to lie back down afterwards.  Objective: Vitals:   11/18/20 0823 11/18/20 1207  BP: (!) 171/84 (!) 167/99  Pulse: 74 87  Resp: 18 (!) 22  Temp: 98 F (36.7 C) 98.2 F (36.8 C)  SpO2: 96% 94%    Intake/Output Summary (Last 24 hours) at 11/18/2020 1355 Last data filed at 11/17/2020 1504 Gross per 24 hour  Intake 119.45 ml  Output --  Net 119.45 ml   Filed Weights   11/12/20 1915 11/12/20 1929 11/13/20 0129  Weight: 62.6 kg 62.6 kg 62.9 kg    ROS: Review of Systems  Respiratory:  Negative for shortness of breath.   Cardiovascular:  Negative for chest pain.  Gastrointestinal:  Negative for abdominal pain and vomiting.  Exam: Physical Exam HENT:     Head: Normocephalic.     Mouth/Throat:     Pharynx: No oropharyngeal exudate.  Eyes:     General: Lids are normal.     Conjunctiva/sclera: Conjunctivae normal.  Cardiovascular:     Rate and Rhythm: Normal rate and regular rhythm.     Heart sounds: Normal heart sounds, S1 normal and S2 normal.  Pulmonary:     Breath sounds: Examination of the right-lower field reveals decreased breath sounds. Examination of the left-lower field reveals decreased breath sounds. Decreased breath sounds present. No wheezing, rhonchi or rales.  Abdominal:     Palpations: Abdomen is soft.     Tenderness: There is no abdominal tenderness.  Musculoskeletal:     Right lower leg: No  swelling.     Left lower leg: No swelling.  Skin:    General: Skin is warm.     Findings: No rash.  Neurological:     Mental Status: She is alert.     Comments: Answers questions appropriately.  Able to straight leg raise to command.     Data Reviewed: Basic Metabolic Panel: Recent Labs  Lab 11/14/20 0557 11/15/20 0912 11/16/20 0502 11/17/20 0512 11/18/20 0455  NA 135 135 138 138 138  K 3.8 3.8 3.9 4.0 4.3  CL 101 100 103 103 104  CO2 27 27 29 30 28   GLUCOSE 139* 200* 134* 116* 109*  BUN 8 8 10 10 12   CREATININE <0.30* 0.47 0.45 0.43* 0.50  CALCIUM 8.4* 8.5* 8.6* 8.6* 8.6*  MG 1.8 1.8 2.2 1.9 2.1   CBC: Recent Labs  Lab 11/14/20 0557 11/15/20 0912 11/16/20 0502 11/17/20 0512 11/18/20 0455  WBC 8.1 9.6 7.8 9.7 9.0  HGB 11.2* 11.6* 11.1* 11.1* 10.6*  HCT 33.8* 35.2* 33.7* 33.9* 33.4*  MCV 81.6 82.4 82.2 83.1 83.1  PLT 453* 498* 496* 537* 562*   BNP (last 3 results) Recent Labs    08/21/20 1628 08/25/20 0811 08/31/20 1430  BNP 320.0* 131.6* 82.1    Recent Results (from the past 240 hour(s))  Urine Culture     Status: Abnormal   Collection Time: 11/14/20  1:26 AM   Specimen: Urine, Random  Result Value Ref Range Status   Specimen Description   Final    URINE, RANDOM Performed at [facility], [address] [place], [place] [address]    Special Requests   Final    NONE Performed at [facility], [address] [place], [place] [address]    Culture (A)  Final    30,000 COLONIES/mL MULTIPLE SPECIES PRESENT, SUGGEST RECOLLECTION   Report Status 11/15/2020 FINAL  Final  Urine Culture     Status: Abnormal   Collection Time: 11/15/20  6:20 PM   Specimen: Urine, Random  Result Value Ref Range Status   Specimen Description   Final    URINE, RANDOM Performed at [facility], [address] [place], [place] [address]    Special Requests   Final    NONE Performed at [facility], [address],  [place],  [address]    Culture (A)  Final    >=100,000 COLONIES/mL CITROBACTER KOSERI 20,000 COLONIES/mL PROTEUS MIRABILIS    Report Status 11/18/2020 FINAL  Final   Organism ID, Bacteria CITROBACTER KOSERI (A)  Final   Organism ID, Bacteria PROTEUS MIRABILIS (A)  Final      Susceptibility   Citrobacter koseri - MIC*    CEFAZOLIN 8 SENSITIVE Sensitive     CEFEPIME <=0.12 SENSITIVE Sensitive     CEFTRIAXONE <=0.25 SENSITIVE Sensitive     CIPROFLOXACIN <=0.25 SENSITIVE Sensitive     GENTAMICIN <=1 SENSITIVE Sensitive     IMIPENEM 4 SENSITIVE Sensitive     NITROFURANTOIN 64 INTERMEDIATE Intermediate     TRIMETH/SULFA >=320 RESISTANT Resistant     PIP/TAZO <=4 SENSITIVE Sensitive     * >=100,000 COLONIES/mL CITROBACTER KOSERI   Proteus mirabilis - MIC*    AMPICILLIN >=32 RESISTANT Resistant     CEFAZOLIN <=4 SENSITIVE Sensitive     CEFEPIME <=0.12 SENSITIVE Sensitive     CEFTRIAXONE <=0.25 SENSITIVE Sensitive     CIPROFLOXACIN <=0.25 SENSITIVE Sensitive     GENTAMICIN <=1 SENSITIVE Sensitive     IMIPENEM <=0.25 SENSITIVE Sensitive     NITROFURANTOIN RESISTANT Resistant     TRIMETH/SULFA <=20 SENSITIVE Sensitive     AMPICILLIN/SULBACTAM <=2 SENSITIVE Sensitive     * 20,000 COLONIES/mL PROTEUS MIRABILIS     Studies: No results found.  Scheduled Meds:  acidophilus  1 capsule Oral Q breakfast   apixaban  5 mg Oral BID   vitamin C  1,000 mg Oral Daily   cholecalciferol  2,000 Units Oral Daily   DULoxetine  20 mg Oral Daily   feeding supplement  237 mL Oral BID BM   melatonin  5 mg Oral QHS   metoprolol succinate  25 mg Oral Q breakfast   multivitamin with minerals  1 tablet Oral Daily   pantoprazole  40 mg Oral BID AC   vitamin B-12  1,000 mcg Oral Daily   Continuous Infusions:  cefTRIAXone (ROCEPHIN)  IV 1 g (11/18/20 1038)    Assessment/Plan:  Acute delirium.  Patient seems better today to me.  Awaiting a daughter's opinion on how she thinks she is doing. Acute  cystitis without hematuria.  Citrobacter growing out of the urine.  Continue Rocephin today New onset atrial fibrillation already on Eliquis and metoprolol Depression on Cymbalta Hiatal hernia on PPI Hypokalemia replaced Previous GI bleed.  Hemoglobin stable on Eliquis Stage I decubitus, present on admission see description below Essential hypertension blood pressure elevated today on  Toprol-XL.  Since the patient wanted to sit down with working with PT I will check orthostatic vital signs before adding more blood pressure medications Weakness.  Was not able to do much today with physical therapy.  Will end up having to go back to rehab  Pressure Injury 11/13/20 Buttocks Right;Left Stage 1 -  Intact skin with non-blanchable redness of a localized area usually over a bony prominence. skin reddened, no open areas (Active)  11/13/20 0216  Location: Buttocks  Location Orientation: Right;Left  Staging: Stage 1 -  Intact skin with non-blanchable redness of a localized area usually over a bony prominence.  Wound Description (Comments): skin reddened, no open areas  Present on Admission: Yes       Code Status:     Code Status Orders  (From admission, onward)           Start     Ordered   11/13/20 0121  Do not attempt resuscitation (DNR)  Continuous       Question Answer Comment  In the event of cardiac or respiratory ARREST Do not call a "code blue"   In the event of cardiac or respiratory ARREST Do not perform Intubation, CPR, defibrillation or ACLS   In the event of cardiac or respiratory ARREST Use medication by any route, position, wound care, and other measures to relive pain and suffering. May use oxygen, suction and manual treatment of airway obstruction as needed for comfort.      11/13/20 0121           Code Status History     Date Active Date Inactive Code Status Order ID Comments User Context   11/12/2020 2345 11/13/2020 0121 DNR [id number]  Merlyn Lot, MD ED    10/31/2020 1943 11/02/2020 2217 DNR [id number]  Rise Patience, MD ED   08/21/2020 1343 08/30/2020 2120 DNR [id number]  Ivor Costa, MD ED   08/21/2020 1342 08/21/2020 1343 DNR [id number]  Ivor Costa, MD ED   11/26/2016 1201 11/28/2016 1637 DNR [id number]  Demetrios Loll, MD Inpatient   08/21/2016 1824 08/25/2016 1857 Full Code [id number]  Epifanio Lesches, MD ED      Advance Directive Documentation    Flowsheet Row Most Recent Value  Type of Advance Directive Out of facility DNR (pink MOST or yellow form)  Pre-existing out of facility DNR order (yellow form or pink MOST form) --  "MOST" Form in Place? --      Family Communication: Left 2 messages for daughter on the phone Disposition Plan: Status is: Inpatient  Dispo: The patient is from: Home              Anticipated d/c is to: Home              Patient currently doing better with regards to delirium.  Would like to see what the patient's daughter has to say about her mental status before making a disposition.  Anticipate discharge back to rehab tomorrow.   Difficult to place patient.  No.  Antibiotics: Rocephin  Time spent: 28 minutes  [place]

## 2020-11-18 NOTE — Progress Notes (Signed)
Nutrition Follow Up Note   DOCUMENTATION CODES:   Non-severe (moderate) malnutrition in context of chronic illness  INTERVENTION:   Ensure Enlive po BID, each supplement provides 350 kcal and 20 grams of protein  Magic cup TID with meals, each supplement provides 290 kcal and 9 grams of protein  MVI po daily   Pt at high refeed risk; recommend monitor potassium, magnesium and phosphorus labs daily until stable  NUTRITION DIAGNOSIS:   Moderate Malnutrition related to cancer and cancer related treatments as evidenced by moderate fat depletion, moderate muscle depletion. -new diagnosis   GOAL:   Patient will meet greater than or equal to 90% of their needs -not met   MONITOR:   PO intake, Supplement acceptance, Labs, Weight trends, Skin, I & O's  ASSESSMENT:   87 y.o. female with medical history significant for hypertension, colon cancer, breast cancer s/p mastectomy, hiatal hernia, GERD, depression, DVT, PUD and recent COVID 19 who was brought into the ER for evaluation of weakness and left leg laceration and was found to have new atrial fib,  Met with pt in room today. Pt is unable to provide any nutrition related history today. Pt's lunch tray was sitting untouched on her side table; pt did drink a few sips of Ensure. Pt with poor appetite and oral intake since admit. RD suspects pt with poor oral intake at baseline. Palliative care following for GOC; family deciding about comfort care.   No new weight since 6/18  Medications reviewed and include: risaquad, vitamin C, D3, melatonin, MVI, protonix, protonix, B12, ceftriaxone   Labs reviewed: K 4.3 wnl, Mg 2.1 wnl Hgb 10.6(L), Hct 33.4(L)  Nutrition Focused Physical Exam:  Flowsheet Row Most Recent Value  Orbital Region Mild depletion  Upper Arm Region Mild depletion  Thoracic and Lumbar Region Moderate depletion  Buccal Region Moderate depletion  Temple Region Moderate depletion  Clavicle Bone Region Severe depletion   Clavicle and Acromion Bone Region Severe depletion  Scapular Bone Region Moderate depletion  Dorsal Hand Severe depletion  Patellar Region Moderate depletion  Anterior Thigh Region Moderate depletion  Posterior Calf Region Moderate depletion  Edema (RD Assessment) None  Hair Reviewed  Eyes Reviewed  Mouth Reviewed  Skin Reviewed  Nails Reviewed      Diet Order:   Diet Order             Diet - low sodium heart healthy           Diet regular Room service appropriate? Yes; Fluid consistency: Thin  Diet effective now                  EDUCATION NEEDS:   No education needs have been identified at this time  Skin:  Skin Assessment: Reviewed RN Assessment (Stage I buttocks, pre-tibial wound)  Last BM:  6/18  Height:   Ht Readings from Last 1 Encounters:  11/13/20 5' 7" (1.702 m)    Weight:   Wt Readings from Last 1 Encounters:  11/13/20 62.9 kg    Ideal Body Weight:  61.36 kg  BMI:  Body mass index is 21.72 kg/m.  Estimated Nutritional Needs:   Kcal:  1500-1700kcal/day  Protein:  75-85g/day  Fluid:  1.5-1.7L/day  Koleen Distance MS, RD, LDN Please refer to [facility] for RD and/or RD on-call/weekend/after hours pager

## 2020-11-18 NOTE — Progress Notes (Addendum)
Daily Progress Note   Patient Name: [name]       Date: 11/18/2020 DOB: Nov 28, 1920  Age: 87 y.o. MRN#: [id number] Attending Physician: Loletha Grayer, MD Primary Care Physician: Sandra Pink, MD Admit Date: 11/12/2020  Reason for Consultation/Follow-up: Establishing goals of care  Subjective:  Patient resting with eyes closed.  Spoke with daughter [name].  This patient is familiar to me from her hospitalization back in April.  Patient was clear at that time that quality of life was very important to her, and that if she could not have the quality of life that she did prior to that hospitalization that she would just want to be made comfortable.  Please see those notes for further details.  [name] has remained updated and can articulate her mother status very well.  She states that over the past couple of months she has been in rehab.  She states "it has been one thing after another".  She discusses her electrolyte imbalance, UTI's, yeast infection, blood clots and then subsequent bleeding with blood thinners, her misery with being hooked up to machines, and pain with being moved.  We discussed that she is declined instead of improved.  We discussed conversations from last admission, where the daughter was present for a goals of care discussion.  She states that she realizes her mother would want to be made comfort care.  She states that although she is the HPOA , she would like her siblings to be on the same page. [name] tells me that one of her brothers has come to see that the patient is not going to show the improvement that they would like, but the other 2 brothers are still wanting to continue aggressive care to see if she improves.   She states they are meeting with an attorney today and she  will speak with him further.  We will follow-up in the morning to speak with her and patient about what options the family can make available to her.    Length of Stay: 0  Current Medications: Scheduled Meds:   acidophilus  1 capsule Oral Q breakfast   apixaban  5 mg Oral BID   vitamin C  1,000 mg Oral Daily   cholecalciferol  2,000 Units Oral Daily   DULoxetine  20 mg Oral Daily  feeding supplement  237 mL Oral BID BM   melatonin  5 mg Oral QHS   metoprolol succinate  25 mg Oral Q breakfast   multivitamin with minerals  1 tablet Oral Daily   pantoprazole  40 mg Oral BID AC   vitamin B-12  1,000 mcg Oral Daily    Continuous Infusions:  cefTRIAXone (ROCEPHIN)  IV 1 g (11/18/20 1038)    PRN Meds: acetaminophen **OR** acetaminophen, albuterol, ondansetron **OR** ondansetron (ZOFRAN) IV  Physical Exam Constitutional:      Comments: Eyes closed.   Pulmonary:     Effort: Pulmonary effort is normal.            Vital Signs: BP (!) 167/99 (BP Location: Right Arm)   Pulse 87   Temp 98.2 F (36.8 C) (Oral)   Resp (!) 22   Ht 5\' 7"  (1.702 m)   Wt 62.9 kg   SpO2 94%   BMI 21.72 kg/m  SpO2: SpO2: 94 % O2 Device: O2 Device: Room Air O2 Flow Rate:    Intake/output summary:  Intake/Output Summary (Last 24 hours) at 11/18/2020 1513 Last data filed at 11/18/2020 1400 Gross per 24 hour  Intake --  Output 100 ml  Net -100 ml   LBM: Last BM Date: 11/15/20 Baseline Weight: Weight: 62.6 kg Most recent weight: Weight: 62.9 kg         Patient Active Problem List   Diagnosis Date Noted   Acute delirium    Acute cystitis without hematuria    Fall 11/13/2020   Weakness 11/13/2020   AF (paroxysmal atrial fibrillation) (HCC) 11/13/2020   Pressure injury of skin 11/13/2020   Knee pain, right 11/10/2020   History of pulmonary embolus (PE) 11/01/2020   COVID-19 virus infection 11/01/2020   Acute GI bleeding 10/31/2020   Severe sepsis with septic shock ([place]) 08/21/2020    Depression 58/52/7782   Acute metabolic encephalopathy 42/35/3614   Fall at home, initial encounter 08/21/2020   Pelvic ring fracture (HCC) 08/21/2020   Hypokalemia 08/21/2020   Hyperglycemia 08/21/2020   Functional diarrhea 07/29/2019   Open wound of left knee, leg, and ankle with complication 43/15/4008   Chest wall pain 10/08/2017   History of recurrent urinary tract infection 02/03/2017   Vaginal pessary in situ 02/03/2017   Iron deficiency anemia due to chronic blood loss 12/01/2016   Personal history of malignant neoplasm of breast 11/09/2016   GI bleed 08/21/2016   Hereditary and idiopathic peripheral neuropathy 09/09/2015   Malignant neoplasm of left female breast ([place]) 09/09/2015   Prolapse of vaginal wall with midline cystocele 08/10/2015   Incomplete bladder emptying 08/10/2015   Midline low back pain without sciatica 08/10/2015   Fecal incontinence 08/10/2015   Recurrent UTI 07/19/2015   Atrophic vaginitis 07/19/2015   Adenomatous colon polyp 02/24/2015   Anemia 02/24/2015   Chronic sinusitis 02/24/2015   Diverticulosis 02/24/2015   Esophagitis 02/24/2015   GERD (gastroesophageal reflux disease) 02/24/2015   Hiatal hernia 02/24/2015   Breast cancer, left breast ([place]) 09/05/2012   Essential hypertension     Palliative Care Assessment & Plan   Recommendations/Plan: H POA daughter is discussing transition to comfort focused care with her siblings.    Code Status:    Code Status Orders  (From admission, onward)           Start     Ordered   11/13/20 0121  Do not attempt resuscitation (DNR)  Continuous       Question Answer Comment  In the event of cardiac or respiratory ARREST Do not call a "code blue"   In the event of cardiac or respiratory ARREST Do not perform Intubation, CPR, defibrillation or ACLS   In the event of cardiac or respiratory ARREST Use medication by any route, position, wound care, and other measures to relive pain and suffering. May  use oxygen, suction and manual treatment of airway obstruction as needed for comfort.      11/13/20 0121           Code Status History     Date Active Date Inactive Code Status Order ID Comments User Context   11/12/2020 2345 11/13/2020 0121 DNR [id number]  Merlyn Lot, MD ED   10/31/2020 1943 11/02/2020 2217 DNR [id number]  Rise Patience, MD ED   08/21/2020 1343 08/30/2020 2120 DNR [id number]  Ivor Costa, MD ED   08/21/2020 1342 08/21/2020 1343 DNR [id number]  Ivor Costa, MD ED   11/26/2016 1201 11/28/2016 1637 DNR [id number]  Demetrios Loll, MD Inpatient   08/21/2016 1824 08/25/2016 1857 Full Code [id number]  Epifanio Lesches, MD ED      Advance Directive Documentation    Flowsheet Row Most Recent Value  Type of Advance Directive Out of facility DNR ([place] MOST or yellow form)  Pre-existing out of facility DNR order (yellow form or [place] MOST form) --  "MOST" Form in Place? --       Prognosis: Poor   Thank you for allowing the Palliative Medicine Team to assist in the care of this patient.       Total Time 45 min Prolonged Time Billed  no       Greater than 50%  of this time was spent counseling and coordinating care related to the above assessment and plan.  Asencion Gowda, NP  Please contact Palliative Medicine Team phone at [phone] for questions and concerns.

## 2020-11-19 ENCOUNTER — Encounter: Payer: Medicare Other | Admitting: Obstetrics and Gynecology

## 2020-11-19 DIAGNOSIS — S81812A Laceration without foreign body, left lower leg, initial encounter: Secondary | ICD-10-CM

## 2020-11-19 DIAGNOSIS — E440 Moderate protein-calorie malnutrition: Secondary | ICD-10-CM | POA: Insufficient documentation

## 2020-11-19 LAB — GLUCOSE, CAPILLARY: Glucose-Capillary: 111 mg/dL — ABNORMAL HIGH (ref 70–99)

## 2020-11-19 MED ORDER — APIXABAN 5 MG PO TABS: 5.0000 mg | ORAL_TABLET | Freq: Two times a day (BID) | ORAL | Status: AC

## 2020-11-19 MED ORDER — CEPHALEXIN 500 MG PO CAPS: 500.0000 mg | ORAL_CAPSULE | Freq: Three times a day (TID) | ORAL | 0 refills | Status: AC

## 2020-11-19 NOTE — Progress Notes (Addendum)
[facility] Liaison note:  Received request from Doran Clay, RN Transitions of Care Manager for hospice services at [facility] after discharge. Chart and patient information reviewed by [facility] physician. Hospice eligibility confirmed.   Spoke with patient's son to initiate education related to hospice philosophy, services and team approach to care. Family verbalized understanding of information given. Per discussion, the plan is for discharge to [facility] by EMS transport on 6.24.22.   No DME needed at this time.  Please send signed and completed DNR with patient to [facility]. Please provide prescriptions at discharge to ensure ongoing symptom management.   [facility] information and contact numbers given to patient's son. Above information shared with Doran Clay, RN, [facility] manager.   Please call with any hospice related questions or concerns.   Thank you for the opportunity to participate in this patient's care.   Bobbie "Loren Racer, RN, BSN [facility] Liaison [phone]

## 2020-11-19 NOTE — Progress Notes (Signed)
Attempted to call report to [facility]

## 2020-11-19 NOTE — Progress Notes (Signed)
[name] to be D/C'd  per MD order back to [facility].  Discussed with the patient and all questions fully answered.  VSS, Skin clean, dry and intact without evidence of skin break down, no evidence of skin tears noted.  IV catheter discontinued intact. Site without signs and symptoms of complications. Dressing and pressure applied.  An After Visit Summary was printed and given to the transport team. D/c education completed with patient/family including follow up instructions, medication list, d/c activities limitations if indicated, with other d/c instructions as indicated by MD - patient able to verbalize understanding, all questions fully answered.   Patient instructed to return to ED, call 911, or call MD for any changes in condition.   Patient to be escorted via stretcher, and D/C to SNF ambulance.

## 2020-11-19 NOTE — TOC Transition Note (Signed)
Transition of Care [facility]) - CM/SW Discharge Note   Patient Details  Name: [name] MRN: [id number] Date of Birth: 1921/05/14  [facility] CM/SW Contact:  Shelbie Hutching, RN Phone Number: 11/19/2020, 2:03 PM   Clinical Narrative:    Patient medically cleared for discharge back to [facility] today with [facility].  MD has talked with patient's daughter and sons about plan.  RNCM has left a message for daughter [name] about discharge plan, she can call back with questions.  Bedside RN to call report.  EMS has been arranged for transport.     Final next level of care: Skilled Nursing Facility Barriers to Discharge: Barriers Resolved   Patient Goals and CMS Choice Patient states their goals for this hospitalization and ongoing recovery are:: [facility] CMS Medicare.gov Compare Post Acute Care list provided to:: Patient Represenative (must comment) Choice offered to / list presented to : Adult Children  Discharge Placement              Patient chooses bed at: [facility] [place] Patient to be transferred to facility by: [facility] Name of family member notified: Anibal Henderson Patient and family notified of of transfer: 11/19/20  Discharge Plan and Services   Discharge Planning Services: CM Consult Post Acute Care Choice: [place]          DME Arranged: N/A DME Agency: NA       HH Arranged: NA          Social Determinants of Health (SDOH) Interventions     Readmission Risk Interventions Readmission Risk Prevention Plan 08/30/2020 08/24/2020  Transportation Screening Complete Complete  PCP or Specialist Appt within 5-7 Days - Complete  PCP or Specialist Appt within 3-5 Days (No Data) -  Home Care Screening - Complete  Medication Review (RN CM) - Complete  HRI or Home Care Consult (No Data) -  Palliative Care Screening Complete -  Medication Review (RN Care Manager) Complete  -  Some recent data might be hidden

## 2020-11-19 NOTE — Discharge Summary (Signed)
[place] [facility] [place] NAME: [name]    MR#:  [id number]  DATE OF BIRTH:  [birth year]  DATE OF ADMISSION:  11/12/2020 ADMITTING PHYSICIAN: Loletha Grayer, MD  DATE OF DISCHARGE: 11/19/2020  PRIMARY CARE PHYSICIAN: [name], MD    ADMISSION DIAGNOSIS:  Hypokalemia [E87.6] Fall [W19.XXXA] Laceration of left lower extremity, initial encounter [B44.967R] Acute metabolic encephalopathy [F16.38]  DISCHARGE DIAGNOSIS:  Principal Problem:   Hypokalemia Active Problems:   Essential hypertension   Depression   Acute metabolic encephalopathy   History of pulmonary embolus (PE)   Weakness   AF (paroxysmal atrial fibrillation) (HCC)   Pressure injury of skin   Acute delirium   Acute cystitis without hematuria   Malnutrition of moderate degree   Laceration of left lower extremity   SECONDARY DIAGNOSIS:   Past Medical History:  Diagnosis Date   Allergy    Arthritis    back   Benign neoplasm of skin, site unspecified    Bowel trouble    Breast cancer ([place])    Cancer ([place])    colon 20 years ago   Cancer of breast ([place]) April 10,.2013   Left breast, 3.8 cm, 4 cm axillary node, T2, N1a, ER negative PR negative, HER-2/neu not amplified.   Cataract    Colon cancer ([place])    Hard of hearing    Heartburn    Hiatal hernia    History of stomach ulcers    History of stomach ulcers    Hypertension 2012   Personal history of malignant neoplasm of breast 2013   LEFT MASTECTOMY,left modified radical mastectomy on September 06, 2011 483.8 cm primary tumor with a 4.6 cm axillary metastasis. 3 of 13 nodes were positive. T2,N1a lesion. This was a triple negative lesion   Shingles Dec 2015   Vaginal atrophy 08/10/2015   Wrist fracture, right     HOSPITAL COURSE:   Acute delirium on Wednesday.  Mental status better as per family.  Likely from acute cystitis. Acute cystitis without hematuria.  Citrobacter growing out of the urine culture.   Received 3 days of Rocephin here.  We will give Keflex upon getting out of the hospital. New onset atrial fibrillation on Eliquis and metoprolol Depression on Cymbalta Hiatal hernia on PPI Hypokalemia.  This has been replaced Previous GI bleed.  Hemoglobin stable on Eliquis Stage I decubitus ulcer present on admission with intact skin nonblanchable redness on bilateral buttock Essential hypertension on Toprol-XL, unable to check orthostatic vital signs Weakness.  Patient not able to do much with physical therapy yesterday.  Nursing staff unable to get her to stand today. Long discussion with family and they have decided to go hospice over at the rehab facility.  Hospice can evaluate and determine whether or not to discontinue medications or not.  Right now her mental status is good because I been treating urinary tract infection.  Spoke with the patient and she does not believe that she is going to be around much longer.  States that she wants to go home to [place].  Conferenced in son from [place] and son was also at the bedside. Patient will need to be fed 3 meals a day. Patient is a DO NOT RESUSCITATE Cover left lower extremity wound with nonstick dressing and wrapped with Kerlix wrap and secure with tape.  DISCHARGE CONDITIONS:   Fair  CONSULTS OBTAINED:  Palliative care  DRUG ALLERGIES:   Allergies  Allergen Reactions   Codeine Nausea  And Vomiting    DISCHARGE MEDICATIONS:   Allergies as of 11/19/2020       Reactions   Codeine Nausea And Vomiting        Medication List     STOP taking these medications    furosemide 20 MG tablet Commonly known as: LASIX   Potassium 99 MG Tabs       TAKE these medications    albuterol 108 (90 Base) MCG/ACT inhaler Commonly known as: VENTOLIN HFA Inhale 1-2 puffs into the lungs every 6 (six) hours as needed for wheezing or shortness of breath.   apixaban 5 MG Tabs tablet Commonly known as: ELIQUIS Take 1 tablet (5 mg  total) by mouth 2 (two) times daily. What changed:  how much to take how to take this when to take this additional instructions   cephALEXin 500 MG capsule Commonly known as: KEFLEX Take 1 capsule (500 mg total) by mouth 3 (three) times daily for 3 days.   Cranberry 500 MG Caps Take by mouth daily.   DULoxetine 20 MG capsule Commonly known as: CYMBALTA Take 20 mg by mouth daily.   loperamide 2 MG tablet Commonly known as: IMODIUM A-D Take 2 mg by mouth as needed for diarrhea or loose stools.   Melatonin 5 MG Caps Take 1 capsule by mouth.   metoprolol succinate 25 MG 24 hr tablet Commonly known as: TOPROL-XL Take 25 mg by mouth daily.   pantoprazole 40 MG tablet Commonly known as: PROTONIX Take 1 tablet (40 mg total) by mouth 2 (two) times daily before a meal.   potassium chloride 10 MEQ tablet Commonly known as: KLOR-CON Take 2 tablets (20 mEq total) by mouth daily for 4 days.   PROBIOTIC COLIC PO Take 1 Dose by mouth daily. doterra brands   vitamin B-12 1000 MCG tablet Commonly known as: CYANOCOBALAMIN Take 1,000 mcg by mouth daily.   vitamin C 1000 MG tablet Take 1,000 mg by mouth daily.   Vitamin D 50 MCG (2000 UT) Caps Take 1 capsule by mouth daily.         DISCHARGE INSTRUCTIONS:   [facility] with hospice following.  If you experience worsening of your admission symptoms, develop shortness of breath, life threatening emergency, suicidal or homicidal thoughts you must seek medical attention immediately by calling 911 or calling your MD immediately  if symptoms less severe.  You Must read complete instructions/literature along with all the possible adverse reactions/side effects for all the Medicines you take and that have been prescribed to you. Take any new Medicines after you have completely understood and accept all the possible adverse reactions/side effects.   Please note  You were cared for by a hospitalist during your  hospital stay. If you have any questions about your discharge medications or the care you received while you were in the hospital after you are discharged, you can call the unit and asked to speak with the hospitalist on call if the hospitalist that took care of you is not available. Once you are discharged, your primary care physician will handle any further medical issues. Please note that NO REFILLS for any discharge medications will be authorized once you are discharged, as it is imperative that you return to your primary care physician (or establish a relationship with a primary care physician if you do not have one) for your aftercare needs so that they can reassess your need for medications and monitor your lab values.    Today  CHIEF COMPLAINT:   Chief Complaint  Patient presents with   Weakness   Extremity Laceration    HISTORY OF PRESENT ILLNESS:  [name]  is a 87 y.o. female with a known history of came into the hospital with weakness and a laceration.   VITAL SIGNS:  Blood pressure (!) 150/74, pulse 63, temperature 97.9 F (36.6 C), resp. rate 17, height _0  (1.702 m), weight 62.9 kg, SpO2 97 %.  I/O:   Intake/Output Summary (Last 24 hours) at 11/19/2020 1230 Last data filed at 11/19/2020 1025 Gross per 24 hour  Intake 720 ml  Output 500 ml  Net 220 ml    PHYSICAL EXAMINATION:  GENERAL:  87 y.o.-year-old patient lying in the bed with no acute distress.  EYES: Pupils equal, round, reactive to light and accommodation. HEENT: Head atraumatic, normocephalic. Oropharynx and nasopharynx clear.  LUNGS: Normal breath sounds bilaterally, no wheezing, rales,rhonchi or crepitation. CARDIOVASCULAR: S1, S2 normal.  2 out of 6 systolic murmur ABDOMEN: Soft, non-tender, non-distended.  EXTREMITIES: Trace pedal edema.  NEUROLOGIC: Cranial nerves II through XII are intact. Muscle strength 5/5 in all extremities. Sensation intact. Gait not checked.  PSYCHIATRIC: The patient  is alert and answers questions appropriately SKIN: Laceration left lower extremity, bruising lower extremity  DATA REVIEW:   CBC Recent Labs  Lab 11/18/20 0455  WBC 9.0  HGB 10.6*  HCT 33.4*  PLT 562*    Chemistries  Recent Labs  Lab 11/18/20 0455  NA 138  K 4.3  CL 104  CO2 28  GLUCOSE 109*  BUN 12  CREATININE 0.50  CALCIUM 8.6*  MG 2.1     Microbiology Results  Results for orders placed or performed during the hospital encounter of 11/12/20  Urine Culture     Status: Abnormal   Collection Time: 11/14/20  1:26 AM   Specimen: Urine, Random  Result Value Ref Range Status   Specimen Description   Final    URINE, RANDOM Performed at [facility], [address] [place], [place] [address]    Special Requests   Final    NONE Performed at [facility], [address] [place], [place] [address]    Culture (A)  Final    30,000 COLONIES/mL MULTIPLE SPECIES PRESENT, SUGGEST RECOLLECTION   Report Status 11/15/2020 FINAL  Final  Urine Culture     Status: Abnormal   Collection Time: 11/15/20  6:20 PM   Specimen: Urine, Random  Result Value Ref Range Status   Specimen Description   Final    URINE, RANDOM Performed at [facility], [address], [place], [place] [address]    Special Requests   Final    NONE Performed at [facility], [address] [place], [place] [address]    Culture (A)  Final    >=100,000 COLONIES/mL CITROBACTER KOSERI 20,000 COLONIES/mL PROTEUS MIRABILIS    Report Status 11/18/2020 FINAL  Final   Organism ID, Bacteria CITROBACTER KOSERI (A)  Final   Organism ID, Bacteria PROTEUS MIRABILIS (A)  Final      Susceptibility   Citrobacter koseri - MIC*    CEFAZOLIN 8 SENSITIVE Sensitive     CEFEPIME <=0.12 SENSITIVE Sensitive     CEFTRIAXONE <=0.25 SENSITIVE Sensitive     CIPROFLOXACIN <=0.25 SENSITIVE Sensitive     GENTAMICIN <=1 SENSITIVE Sensitive     IMIPENEM 4 SENSITIVE Sensitive      NITROFURANTOIN 64 INTERMEDIATE Intermediate     TRIMETH/SULFA >=320 RESISTANT Resistant  PIP/TAZO <=4 SENSITIVE Sensitive     * >=100,000 COLONIES/mL CITROBACTER KOSERI   Proteus mirabilis - MIC*    AMPICILLIN >=32 RESISTANT Resistant     CEFAZOLIN <=4 SENSITIVE Sensitive     CEFEPIME <=0.12 SENSITIVE Sensitive     CEFTRIAXONE <=0.25 SENSITIVE Sensitive     CIPROFLOXACIN <=0.25 SENSITIVE Sensitive     GENTAMICIN <=1 SENSITIVE Sensitive     IMIPENEM <=0.25 SENSITIVE Sensitive     NITROFURANTOIN RESISTANT Resistant     TRIMETH/SULFA <=20 SENSITIVE Sensitive     AMPICILLIN/SULBACTAM <=2 SENSITIVE Sensitive     * 20,000 COLONIES/mL PROTEUS MIRABILIS     Management plans discussed with the patient, family and they are in agreement.  CODE STATUS:     Code Status Orders  (From admission, onward)           Start     Ordered   11/13/20 0121  Do not attempt resuscitation (DNR)  Continuous       Question Answer Comment  In the event of cardiac or respiratory ARREST Do not call a "code blue"   In the event of cardiac or respiratory ARREST Do not perform Intubation, CPR, defibrillation or ACLS   In the event of cardiac or respiratory ARREST Use medication by any route, position, wound care, and other measures to relive pain and suffering. May use oxygen, suction and manual treatment of airway obstruction as needed for comfort.      11/13/20 0121           Code Status History     Date Active Date Inactive Code Status Order ID Comments User Context   11/12/2020 2345 11/13/2020 0121 DNR [id number]  Merlyn Lot, MD ED   10/31/2020 1943 11/02/2020 2217 DNR [id number]  Rise Patience, MD ED   08/21/2020 1343 08/30/2020 2120 DNR [id number]  Ivor Costa, MD ED   08/21/2020 1342 08/21/2020 1343 DNR [id number]  Ivor Costa, MD ED   11/26/2016 1201 11/28/2016 1637 DNR [id number]  Demetrios Loll, MD Inpatient   08/21/2016 1824 08/25/2016 1857 Full Code [id number]  Epifanio Lesches, MD ED       Advance Directive Documentation    Flowsheet Row Most Recent Value  Type of Advance Directive Out of facility DNR (pink MOST or yellow form)  Pre-existing out of facility DNR order (yellow form or pink MOST form) --  "MOST" Form in Place? --       TOTAL TIME TAKING CARE OF THIS PATIENT: 35 minutes.    Loletha Grayer M.D on 11/19/2020 at 12:30 PM  Between 7am to 6pm - Pager - [phone]  After 6pm go to [email or web address] - password [email or web address]  [facility]  CC: Primary care physician; [name], MD

## 2020-11-19 NOTE — Progress Notes (Addendum)
Daily Progress Note   Patient Name: [name]       Date: 11/19/2020 DOB: 1920-08-04  Age: 87 y.o. MRN#: [id number] Attending Physician: Sandra Grayer, MD Primary Care Physician: Sandra Pink, MD Admit Date: 11/12/2020  Reason for Consultation/Follow-up: Establishing goals of care  Subjective: Patient is sitting in bed. She states she does not feel as good today as she did yesterday.    We discussed her diagnoses, prognosis, GOC, EOL wishes disposition and options.  Created space and opportunity for patient  to explore thoughts and feelings regarding current medical information.   A detailed discussion was had today regarding advanced directives.  Concepts specific to code status, artifical feeding and hydration, IV antibiotics and rehospitalization were discussed.  The difference between an aggressive medical intervention path and a comfort care path was discussed.  Values and goals of care important to patient and family were attempted to be elicited.  Discussed limitations of medical interventions to prolong quality of life in some situations and discussed the concept of human mortality.  She states she would like to focus on comfort.   Spoke with daughter she states she needs to finish talking to family.   ADDENDUM: Spoke with son [name], he states he understands her status and wants to proceed with hospice facility placement.   Length of Stay: 1  Current Medications: Scheduled Meds:   acidophilus  1 capsule Oral Q breakfast   apixaban  5 mg Oral BID   vitamin C  1,000 mg Oral Daily   cholecalciferol  2,000 Units Oral Daily   DULoxetine  20 mg Oral Daily   feeding supplement  237 mL Oral BID BM   melatonin  5 mg Oral QHS   metoprolol succinate  25 mg Oral Q breakfast    multivitamin with minerals  1 tablet Oral Daily   pantoprazole  40 mg Oral BID AC   vitamin B-12  1,000 mcg Oral Daily    Continuous Infusions:  cefTRIAXone (ROCEPHIN)  IV 1 g (11/19/20 0908)    PRN Meds: acetaminophen **OR** acetaminophen, albuterol, ondansetron **OR** ondansetron (ZOFRAN) IV  Physical Exam Pulmonary:     Effort: Pulmonary effort is normal.  Neurological:     Mental Status: She is alert.            Vital Signs:  BP 134/86 (BP Location: Left Arm)   Pulse 76   Temp 98.5 F (36.9 C)   Resp 16   Ht 5\' 7"  (1.702 m)   Wt 62.9 kg   SpO2 96%   BMI 21.72 kg/m  SpO2: SpO2: 96 % O2 Device: O2 Device: Room Air O2 Flow Rate:    Intake/output summary:  Intake/Output Summary (Last 24 hours) at 11/19/2020 1048 Last data filed at 11/19/2020 1025 Gross per 24 hour  Intake 720 ml  Output 500 ml  Net 220 ml   LBM: Last BM Date: 11/15/20 Baseline Weight: Weight: 62.6 kg Most recent weight: Weight: 62.9 kg     Patient Active Problem List   Diagnosis Date Noted   Malnutrition of moderate degree 11/19/2020   Acute delirium    Acute cystitis without hematuria    Fall 11/13/2020   Weakness 11/13/2020   AF (paroxysmal atrial fibrillation) ([place]) 11/13/2020   Pressure injury of skin 11/13/2020   Knee pain, right 11/10/2020   History of pulmonary embolus (PE) 11/01/2020   COVID-19 virus infection 11/01/2020   Acute GI bleeding 10/31/2020   Severe sepsis with septic shock ([place]) 08/21/2020   Depression 73/53/2992   Acute metabolic encephalopathy 42/68/3419   Fall at home, initial encounter 08/21/2020   Pelvic ring fracture (HCC) 08/21/2020   Hypokalemia 08/21/2020   Hyperglycemia 08/21/2020   Functional diarrhea 07/29/2019   Open wound of left knee, leg, and ankle with complication 62/22/9798   Chest wall pain 10/08/2017   History of recurrent urinary tract infection 02/03/2017   Vaginal pessary in situ 02/03/2017   Iron deficiency anemia due to chronic blood  loss 12/01/2016   Personal history of malignant neoplasm of breast 11/09/2016   GI bleed 08/21/2016   Hereditary and idiopathic peripheral neuropathy 09/09/2015   Malignant neoplasm of left female breast ([place]) 09/09/2015   Prolapse of vaginal wall with midline cystocele 08/10/2015   Incomplete bladder emptying 08/10/2015   Midline low back pain without sciatica 08/10/2015   Fecal incontinence 08/10/2015   Recurrent UTI 07/19/2015   Atrophic vaginitis 07/19/2015   Adenomatous colon polyp 02/24/2015   Anemia 02/24/2015   Chronic sinusitis 02/24/2015   Diverticulosis 02/24/2015   Esophagitis 02/24/2015   GERD (gastroesophageal reflux disease) 02/24/2015   Hiatal hernia 02/24/2015   Breast cancer, left breast ([place]) 09/05/2012   Essential hypertension     Palliative Care Assessment & Plan   Recommendations/Plan: Still working on hospice.   Spoke with son, he would like hospice facility placement. 4 siblings to talk further.    Code Status:    Code Status Orders  (From admission, onward)           Start     Ordered   11/13/20 0121  Do not attempt resuscitation (DNR)  Continuous       Question Answer Comment  In the event of cardiac or respiratory ARREST Do not call a "code blue"   In the event of cardiac or respiratory ARREST Do not perform Intubation, CPR, defibrillation or ACLS   In the event of cardiac or respiratory ARREST Use medication by any route, position, wound care, and other measures to relive pain and suffering. May use oxygen, suction and manual treatment of airway obstruction as needed for comfort.      11/13/20 0121           Code Status History     Date Active Date Inactive Code Status Order ID Comments User Context   11/12/2020  2345 11/13/2020 0121 DNR [id number]  Merlyn Lot, MD ED   10/31/2020 1943 11/02/2020 2217 DNR [id number]  Rise Patience, MD ED   08/21/2020 1343 08/30/2020 2120 DNR [id number]  Ivor Costa, MD ED   08/21/2020 1342 08/21/2020  1343 DNR [id number]  Ivor Costa, MD ED   11/26/2016 1201 11/28/2016 1637 DNR [id number]  Demetrios Loll, MD Inpatient   08/21/2016 1824 08/25/2016 1857 Full Code [id number]  Epifanio Lesches, MD ED      Advance Directive Documentation    Flowsheet Row Most Recent Value  Type of Advance Directive Out of facility DNR (Weeks MOST or yellow form)  Pre-existing out of facility DNR order (yellow form or Weeks MOST form) --  "MOST" Form in Place? --       Prognosis:  < 2 weeks with full comfort care. Frequent UTI with current UTI, hypokalemia, new onset A-fib-rate controlled. Poor PO intake per RD and nursing.     Thank you for allowing the Palliative Medicine Team to assist in the care of this patient.   Time In: 10:30 Time Out: 11:40 Total Time 70 min Prolonged Time Billed  yes      Greater than 50%  of this time was spent counseling and coordinating care related to the above assessment and plan.  Asencion Gowda, NP  Please contact Palliative Medicine Team phone at [phone] for questions and concerns.

## 2020-11-19 NOTE — Progress Notes (Signed)
Attempted to call report to [facility]

## 2021-08-11 IMAGING — CT CT HEAD W/O CM
3 series · 15 of 47 positions shown, 18 images · non-contrast
Comparison: None.

CLINICAL DATA: [AGE] female with head and neck injury from
fall and mental status change.

EXAM:
CT HEAD WITHOUT CONTRAST
CT CERVICAL SPINE WITHOUT CONTRAST
TECHNIQUE: Multidetector CT imaging of the head and cervical spine was
performed following the standard protocol without intravenous
contrast. Multiplanar CT image reconstructions of the cervical spine
were also generated.

[Series 3: coronal soft tissue · coronal · 0.29mm/px · 3 of 70 slices shown]
[im 26/70  brain]
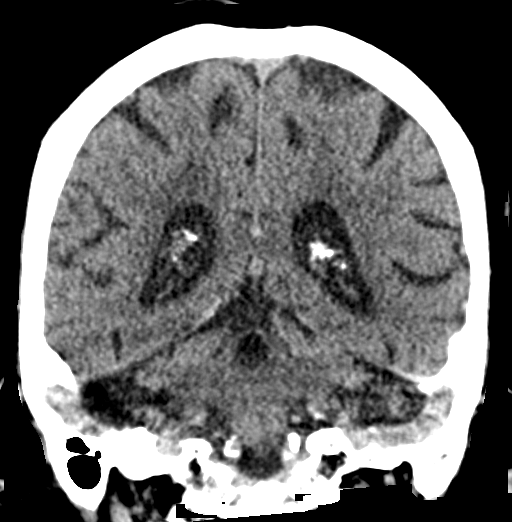
[im 32/70  brain]
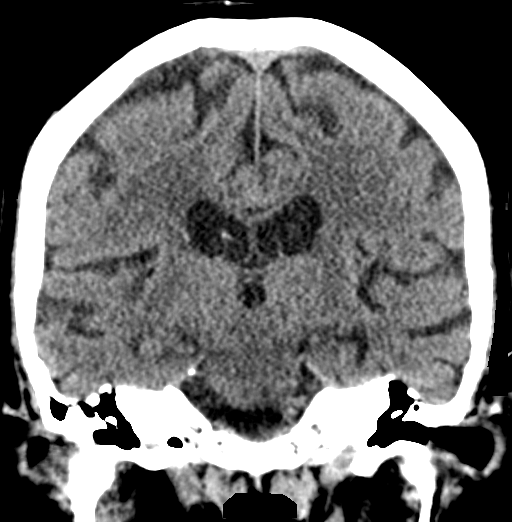
[im 38/70  brain]
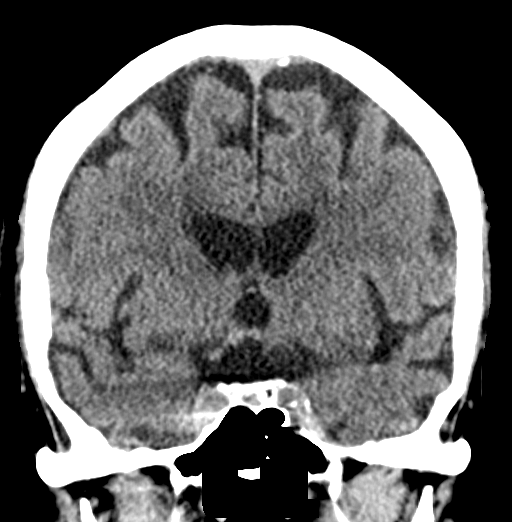

[Series 4: sagittal soft tissue · sagittal · 0.30mm/px · 3 of 50 slices shown]
[im 17/50  brain]
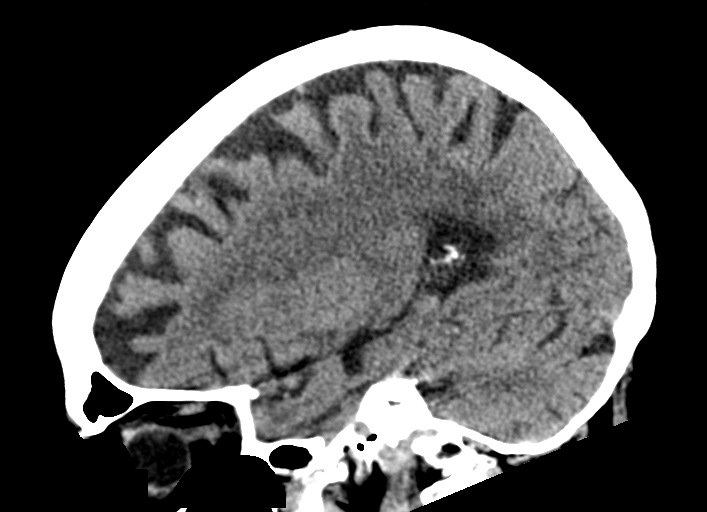
[im 25/50  brain]
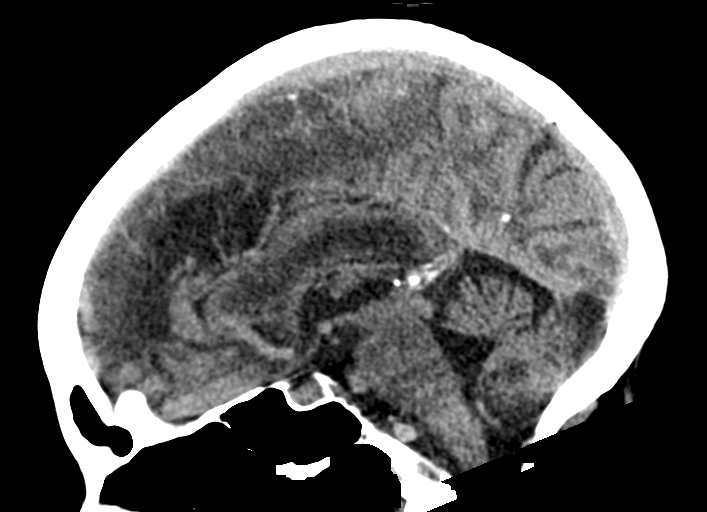
[im 33/50  brain]
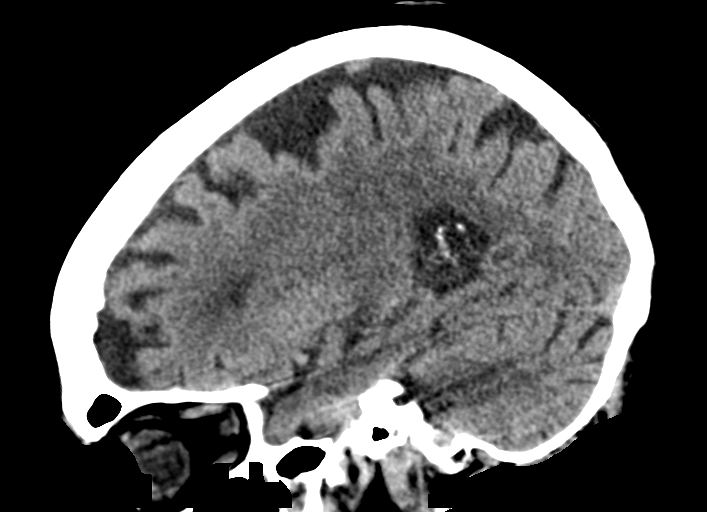

[Series 5: head wo · axial · 0.42mm/px · z∈[-81,+44]mm · 9 of 31 slices shown, 12 images]
[im 3/31  brain]
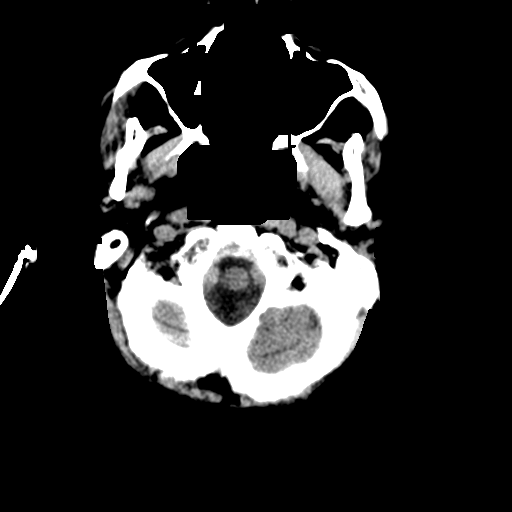
[im 3/31  bone]
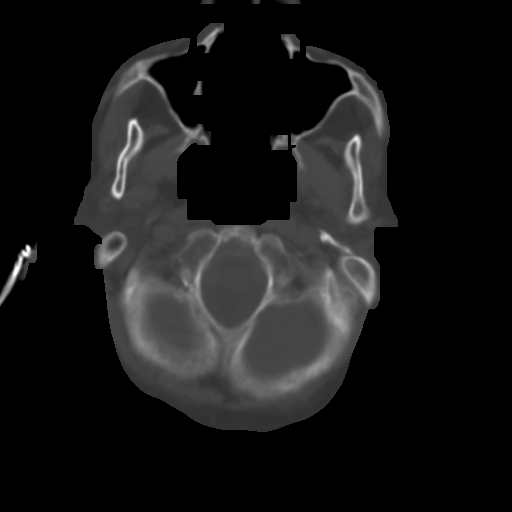
[im 6/31  brain]
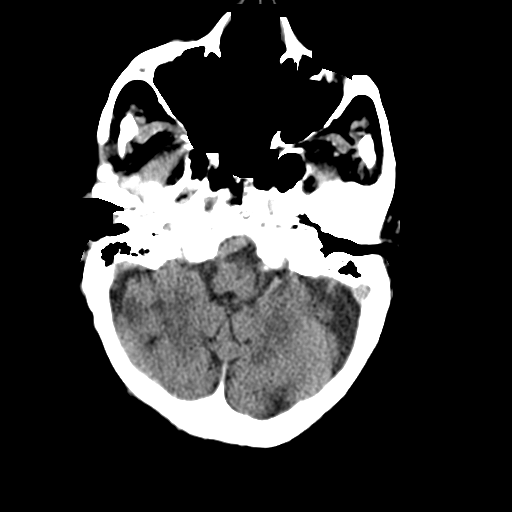
[im 9/31  brain]
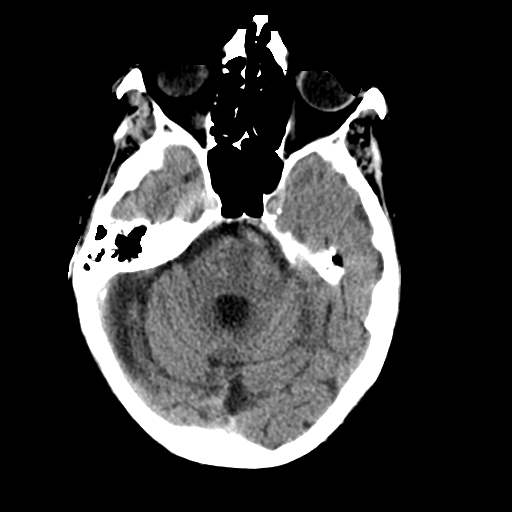
[im 12/31  brain]
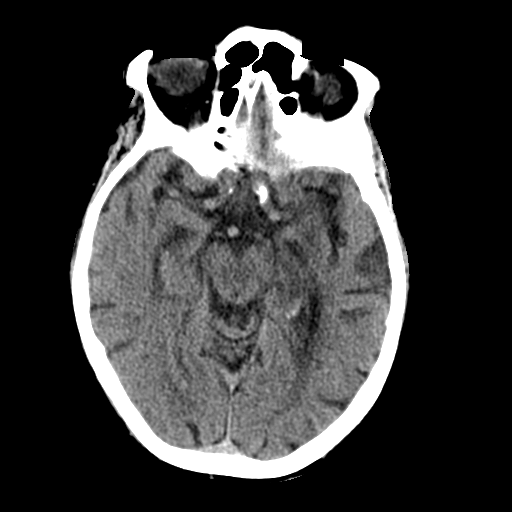
[im 16/31  brain]
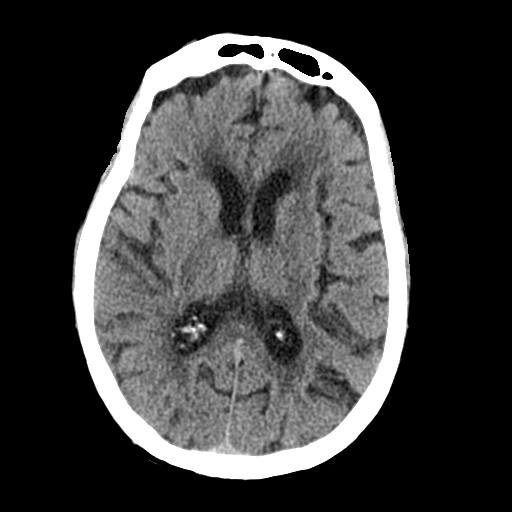
[im 16/31  bone]
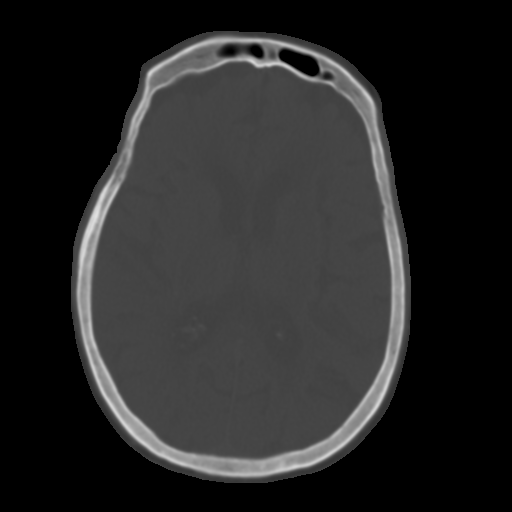
[im 19/31  brain]
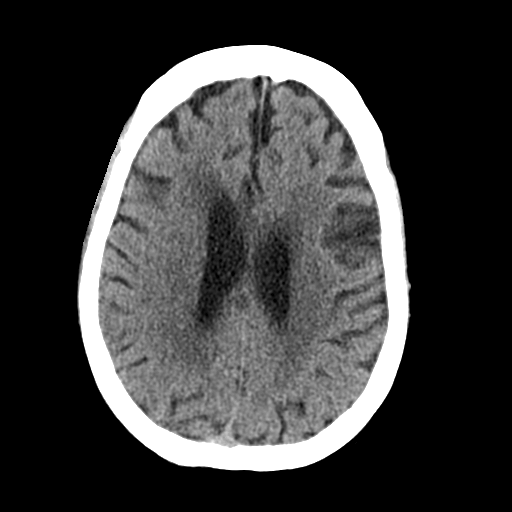
[im 22/31  brain]
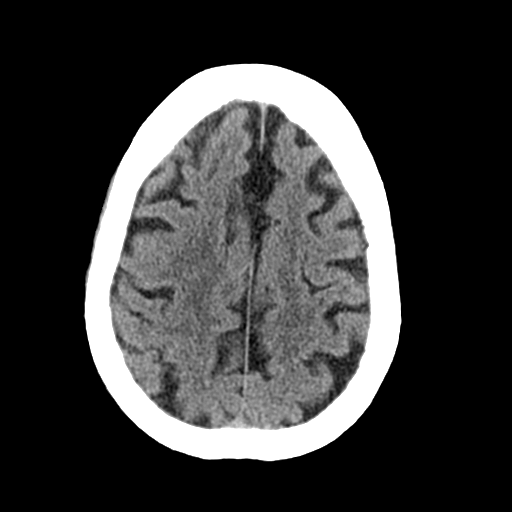
[im 25/31  brain]
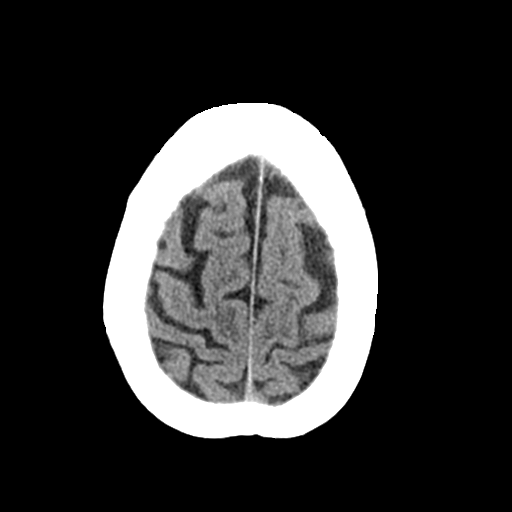
[im 28/31  brain]
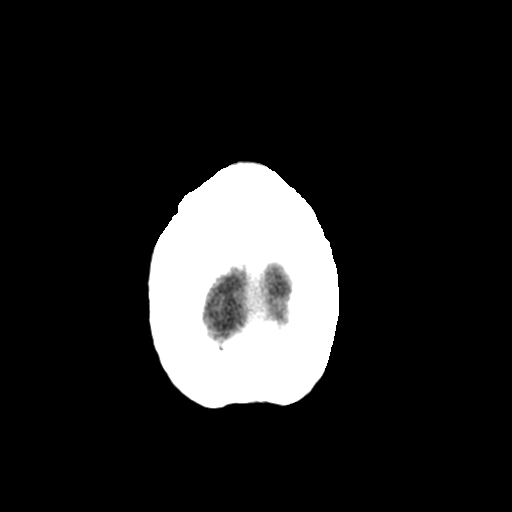
[im 28/31  bone]
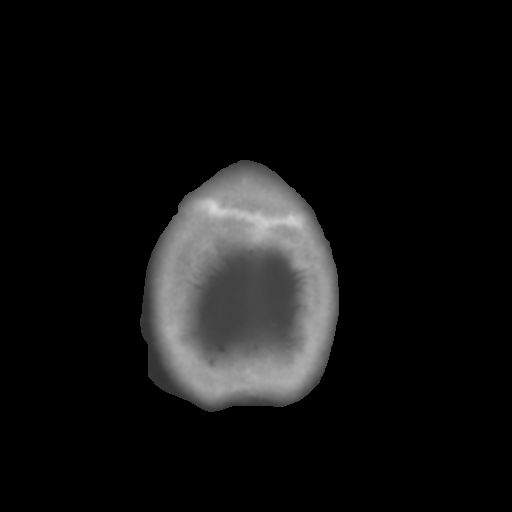

[15 of 47 positions shown; findings below may reference images not displayed]

FINDINGS: CT HEAD FINDINGS

Brain: No evidence of acute infarction, hemorrhage, hydrocephalus,
extra-axial collection or mass lesion/mass effect.

Atrophy, chronic small-vessel white matter ischemic changes and
small remote bilateral cerebellar infarcts are noted.

Vascular: Carotid and vertebral atherosclerotic calcifications are
noted.

Skull: Normal. Negative for fracture or focal lesion.

Sinuses/Orbits: No acute finding.

Other: None.

CT CERVICAL SPINE FINDINGS

Alignment: Normal.

Skull base and vertebrae: No acute fracture. No primary bone lesion
or focal pathologic process.

Soft tissues and spinal canal: No prevertebral fluid or swelling. No
visible canal hematoma.

Disc levels: Mild to moderate degenerative disc disease/spondylosis
from C4-C7 noted. Mild to moderate multilevel facet arthropathy is
identified.

Upper chest: No acute abnormality

Other: None
IMPRESSION: 1. No evidence of acute intracranial abnormality. Atrophy, chronic
small-vessel white matter ischemic changes and small remote
bilateral cerebellar infarcts.
2. No static evidence of acute injury to the cervical spine.

## 2021-08-21 IMAGING — CR DG CHEST 2V
2 series · 2 of 2 positions shown · non-contrast
Comparison: August 27, 2020.

CLINICAL DATA: Shortness of breath.

EXAM:
CHEST - 2 VIEW

[chest lat]
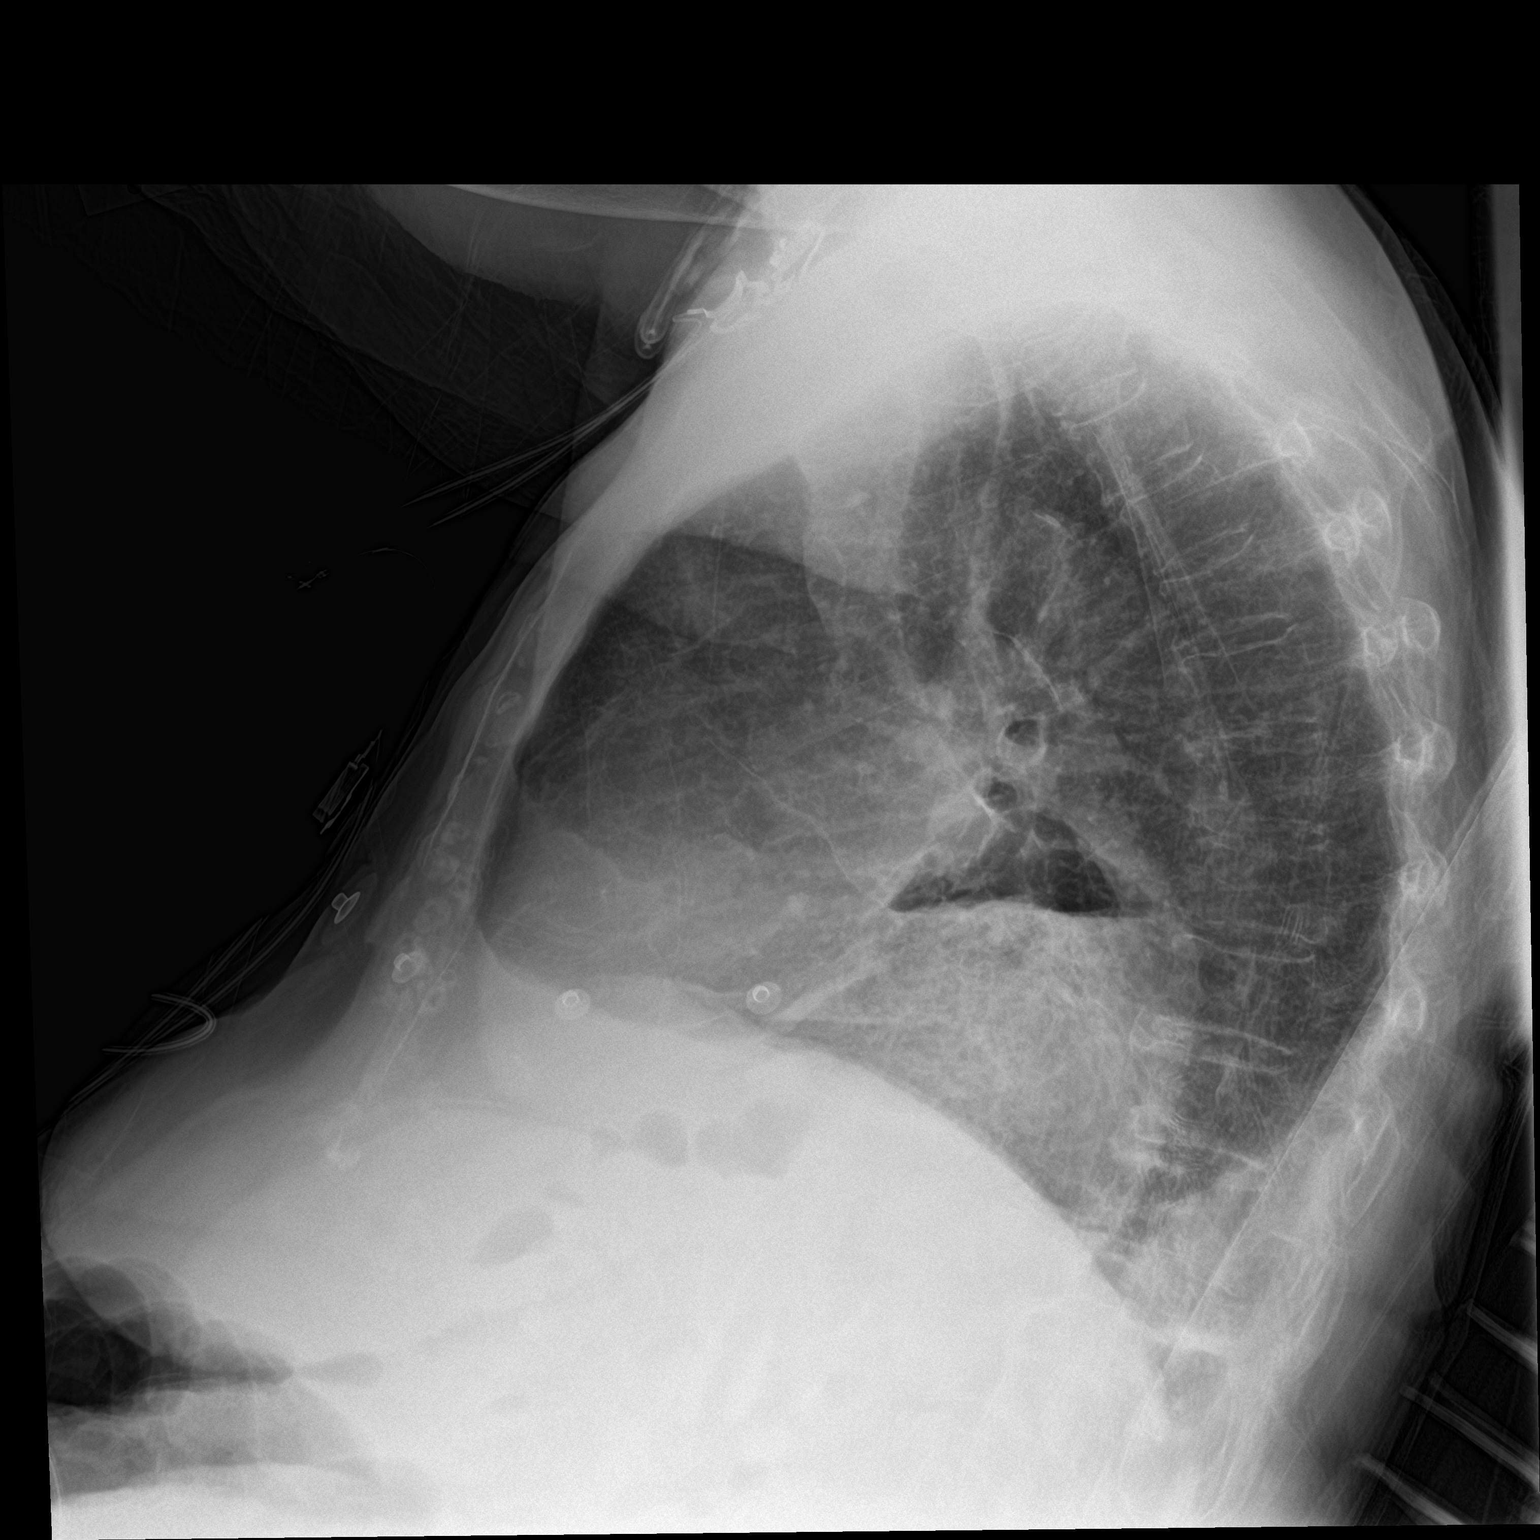

[chest ap]
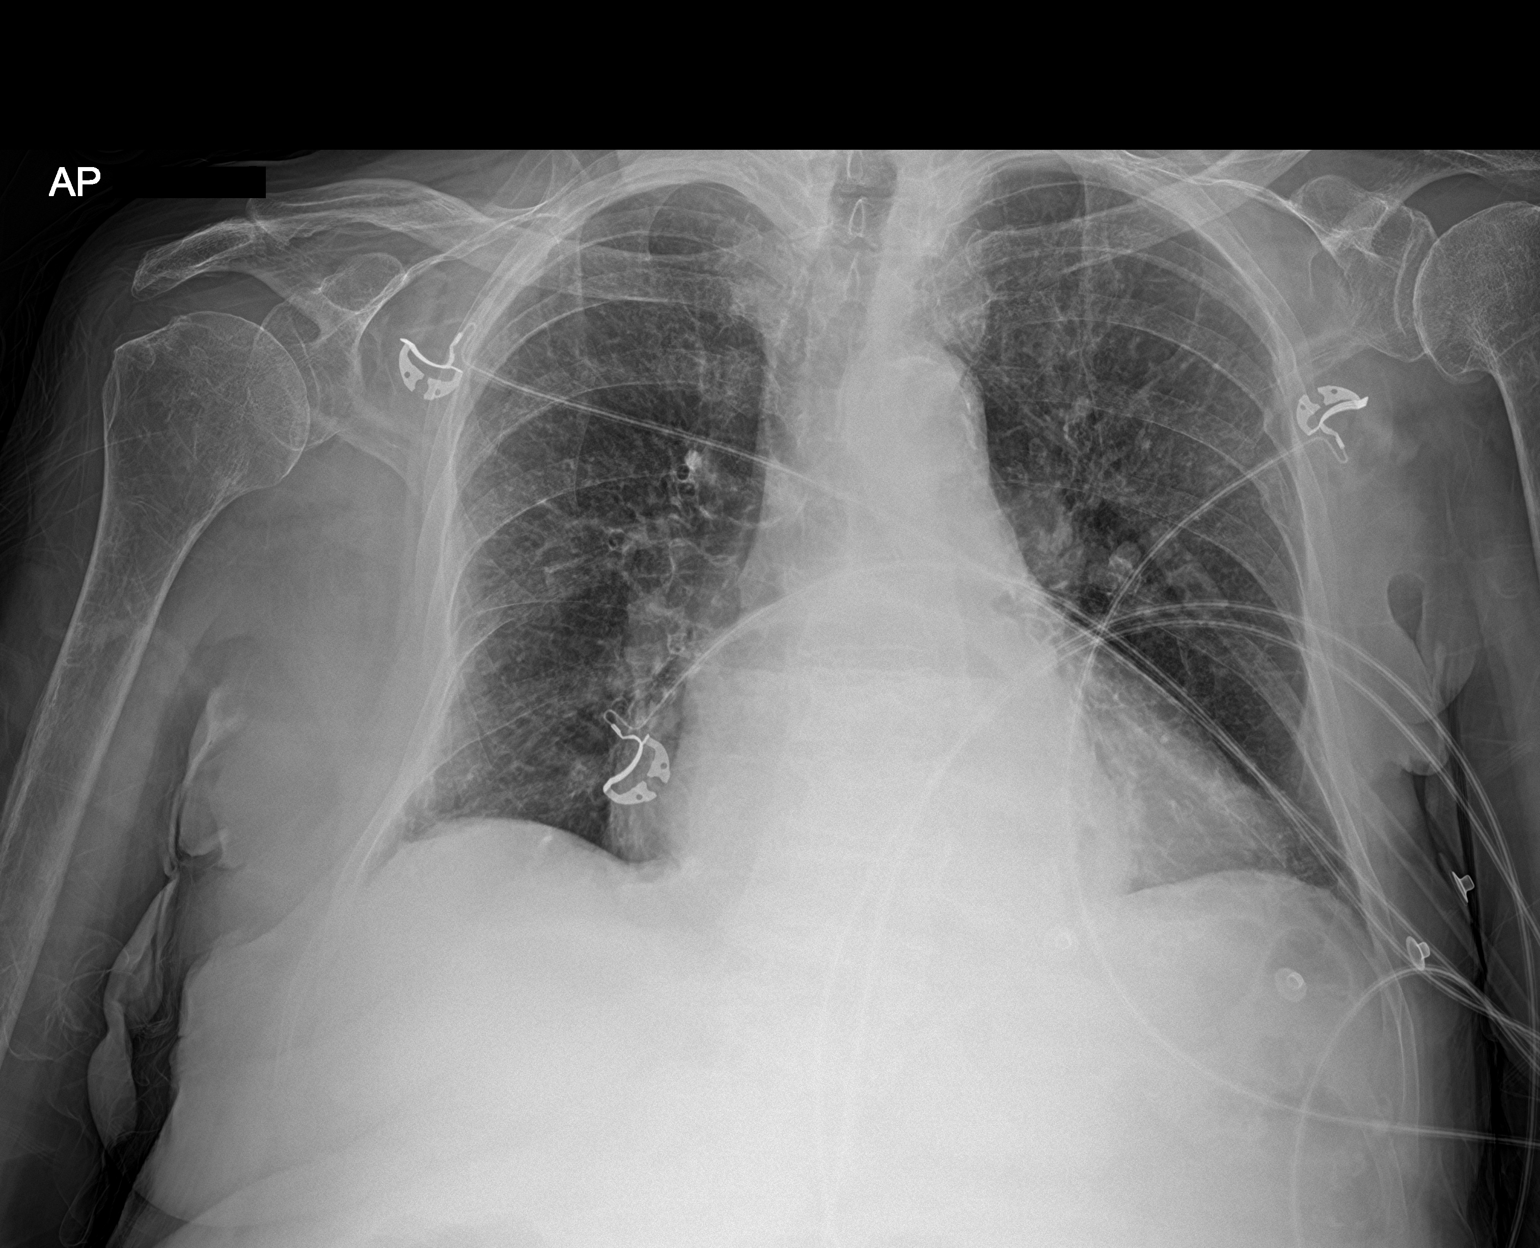

[2 of 2 positions shown; findings below may reference images not displayed]

FINDINGS: Stable cardiomediastinal silhouette. No pneumothorax is noted.
Stable large hiatal hernia is noted. No significant pleural effusion
is noted. Probably stable minimal bilateral pulmonary edema. Bony
thorax is unremarkable.
IMPRESSION: Probable stable minimal bilateral pulmonary edema. Stable large
hiatal hernia.

Aortic Atherosclerosis ([id number]-[id number]).

## 2021-10-27 DEATH — deceased
